# Patient Record
Sex: Female | Born: 1950 | Race: White | Hispanic: No | Marital: Married | Smoking: Never smoker
Health system: Southern US, Community
[De-identification: ages and names within clinical notes are randomized; demographics above are authoritative.]

## PROBLEM LIST (undated history)

## (undated) DIAGNOSIS — I700 Atherosclerosis of aorta: Secondary | ICD-10-CM

## (undated) DIAGNOSIS — F32A Depression, unspecified: Secondary | ICD-10-CM

## (undated) DIAGNOSIS — F419 Anxiety disorder, unspecified: Secondary | ICD-10-CM

## (undated) DIAGNOSIS — I77810 Thoracic aortic ectasia: Secondary | ICD-10-CM

## (undated) DIAGNOSIS — T4145XA Adverse effect of unspecified anesthetic, initial encounter: Secondary | ICD-10-CM

## (undated) DIAGNOSIS — Z87440 Personal history of urinary (tract) infections: Secondary | ICD-10-CM

## (undated) DIAGNOSIS — D649 Anemia, unspecified: Secondary | ICD-10-CM

## (undated) DIAGNOSIS — I10 Essential (primary) hypertension: Secondary | ICD-10-CM

## (undated) DIAGNOSIS — N289 Disorder of kidney and ureter, unspecified: Secondary | ICD-10-CM

## (undated) DIAGNOSIS — M8580 Other specified disorders of bone density and structure, unspecified site: Secondary | ICD-10-CM

## (undated) DIAGNOSIS — K5790 Diverticulosis of intestine, part unspecified, without perforation or abscess without bleeding: Secondary | ICD-10-CM

## (undated) DIAGNOSIS — K219 Gastro-esophageal reflux disease without esophagitis: Secondary | ICD-10-CM

## (undated) DIAGNOSIS — C58 Malignant neoplasm of placenta: Secondary | ICD-10-CM

## (undated) DIAGNOSIS — N814 Uterovaginal prolapse, unspecified: Secondary | ICD-10-CM

## (undated) DIAGNOSIS — J3802 Paralysis of vocal cords and larynx, bilateral: Secondary | ICD-10-CM

## (undated) DIAGNOSIS — R053 Chronic cough: Secondary | ICD-10-CM

## (undated) DIAGNOSIS — E785 Hyperlipidemia, unspecified: Secondary | ICD-10-CM

## (undated) DIAGNOSIS — M1990 Unspecified osteoarthritis, unspecified site: Secondary | ICD-10-CM

## (undated) DIAGNOSIS — S060XAA Concussion with loss of consciousness status unknown, initial encounter: Secondary | ICD-10-CM

## (undated) DIAGNOSIS — B9620 Unspecified Escherichia coli [E. coli] as the cause of diseases classified elsewhere: Secondary | ICD-10-CM

## (undated) DIAGNOSIS — O019 Hydatidiform mole, unspecified: Secondary | ICD-10-CM

## (undated) DIAGNOSIS — O0289 Other abnormal products of conception: Secondary | ICD-10-CM

## (undated) DIAGNOSIS — E538 Deficiency of other specified B group vitamins: Secondary | ICD-10-CM

## (undated) DIAGNOSIS — T8859XA Other complications of anesthesia, initial encounter: Secondary | ICD-10-CM

## (undated) DIAGNOSIS — K589 Irritable bowel syndrome without diarrhea: Secondary | ICD-10-CM

## (undated) DIAGNOSIS — R05 Cough: Secondary | ICD-10-CM

## (undated) DIAGNOSIS — T7840XA Allergy, unspecified, initial encounter: Secondary | ICD-10-CM

## (undated) DIAGNOSIS — N12 Tubulo-interstitial nephritis, not specified as acute or chronic: Secondary | ICD-10-CM

## (undated) DIAGNOSIS — S060X9A Concussion with loss of consciousness of unspecified duration, initial encounter: Secondary | ICD-10-CM

## (undated) DIAGNOSIS — E119 Type 2 diabetes mellitus without complications: Secondary | ICD-10-CM

## (undated) HISTORY — DX: Cough: R05

## (undated) HISTORY — DX: Hyperlipidemia, unspecified: E78.5

## (undated) HISTORY — DX: Anxiety disorder, unspecified: F41.9

## (undated) HISTORY — PX: CARDIAC CATHETERIZATION: SHX172

## (undated) HISTORY — DX: Atherosclerosis of aorta: I70.0

## (undated) HISTORY — DX: Malignant neoplasm of placenta: O02.89

## (undated) HISTORY — DX: Personal history of urinary (tract) infections: Z87.440

## (undated) HISTORY — DX: Depression, unspecified: F32.A

## (undated) HISTORY — DX: Essential (primary) hypertension: I10

## (undated) HISTORY — DX: Unspecified Escherichia coli (E. coli) as the cause of diseases classified elsewhere: B96.20

## (undated) HISTORY — DX: Paralysis of vocal cords and larynx, bilateral: J38.02

## (undated) HISTORY — DX: Uterovaginal prolapse, unspecified: N81.4

## (undated) HISTORY — PX: BUNIONECTOMY: SHX129

## (undated) HISTORY — PX: EYE SURGERY: SHX253

## (undated) HISTORY — DX: Other specified disorders of bone density and structure, unspecified site: M85.80

## (undated) HISTORY — DX: Tubulo-interstitial nephritis, not specified as acute or chronic: N12

## (undated) HISTORY — PX: DILATION AND CURETTAGE OF UTERUS: SHX78

## (undated) HISTORY — DX: Allergy, unspecified, initial encounter: T78.40XA

## (undated) HISTORY — DX: Irritable bowel syndrome, unspecified: K58.9

## (undated) HISTORY — DX: Hydatidiform mole, unspecified: O01.9

## (undated) HISTORY — DX: Anemia, unspecified: D64.9

## (undated) HISTORY — DX: Type 2 diabetes mellitus without complications: E11.9

## (undated) HISTORY — PX: CATARACT EXTRACTION: SUR2

## (undated) HISTORY — DX: Unspecified osteoarthritis, unspecified site: M19.90

## (undated) HISTORY — DX: Deficiency of other specified B group vitamins: E53.8

## (undated) HISTORY — DX: Diverticulosis of intestine, part unspecified, without perforation or abscess without bleeding: K57.90

## (undated) HISTORY — DX: Gastro-esophageal reflux disease without esophagitis: K21.9

## (undated) HISTORY — DX: Malignant neoplasm of placenta: C58

## (undated) HISTORY — DX: Chronic cough: R05.3

---
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
## 1998-01-05 ENCOUNTER — Encounter: Payer: Self-pay | Admitting: Internal Medicine

## 1999-10-22 DIAGNOSIS — J3802 Paralysis of vocal cords and larynx, bilateral: Secondary | ICD-10-CM

## 1999-10-22 HISTORY — DX: Paralysis of vocal cords and larynx, bilateral: J38.02

## 2000-02-06 ENCOUNTER — Emergency Department (HOSPITAL_COMMUNITY): Admission: EM | Admit: 2000-02-06 | Discharge: 2000-02-06 | Payer: Self-pay | Admitting: Emergency Medicine

## 2000-06-27 ENCOUNTER — Ambulatory Visit (HOSPITAL_COMMUNITY): Admission: RE | Admit: 2000-06-27 | Discharge: 2000-06-27 | Payer: Self-pay | Admitting: Family Medicine

## 2000-06-27 ENCOUNTER — Encounter: Payer: Self-pay | Admitting: Family Medicine

## 2001-01-21 ENCOUNTER — Encounter: Admission: RE | Admit: 2001-01-21 | Discharge: 2001-01-21 | Payer: Self-pay | Admitting: Family Medicine

## 2001-01-21 ENCOUNTER — Encounter: Payer: Self-pay | Admitting: Family Medicine

## 2002-02-26 ENCOUNTER — Encounter: Admission: RE | Admit: 2002-02-26 | Discharge: 2002-02-26 | Payer: Self-pay | Admitting: Family Medicine

## 2002-02-26 ENCOUNTER — Encounter: Payer: Self-pay | Admitting: Family Medicine

## 2003-03-04 ENCOUNTER — Encounter: Payer: Self-pay | Admitting: Family Medicine

## 2003-03-04 ENCOUNTER — Encounter: Admission: RE | Admit: 2003-03-04 | Discharge: 2003-03-04 | Payer: Self-pay | Admitting: Family Medicine

## 2003-09-06 ENCOUNTER — Ambulatory Visit (HOSPITAL_COMMUNITY): Admission: RE | Admit: 2003-09-06 | Discharge: 2003-09-06 | Payer: Self-pay | Admitting: Family Medicine

## 2004-05-22 ENCOUNTER — Other Ambulatory Visit: Admission: RE | Admit: 2004-05-22 | Discharge: 2004-05-22 | Payer: Self-pay | Admitting: Obstetrics and Gynecology

## 2005-02-14 ENCOUNTER — Ambulatory Visit: Payer: Self-pay | Admitting: Gastroenterology

## 2005-05-30 ENCOUNTER — Ambulatory Visit: Payer: Self-pay | Admitting: Gastroenterology

## 2005-06-05 ENCOUNTER — Ambulatory Visit (HOSPITAL_COMMUNITY): Admission: RE | Admit: 2005-06-05 | Discharge: 2005-06-05 | Payer: Self-pay | Admitting: Gastroenterology

## 2005-06-10 ENCOUNTER — Ambulatory Visit: Payer: Self-pay | Admitting: Gastroenterology

## 2005-06-21 ENCOUNTER — Ambulatory Visit (HOSPITAL_COMMUNITY): Admission: RE | Admit: 2005-06-21 | Discharge: 2005-06-21 | Payer: Self-pay | Admitting: Gastroenterology

## 2005-06-21 ENCOUNTER — Ambulatory Visit: Payer: Self-pay | Admitting: Gastroenterology

## 2005-07-12 ENCOUNTER — Encounter: Admission: RE | Admit: 2005-07-12 | Discharge: 2005-07-12 | Payer: Self-pay | Admitting: General Surgery

## 2005-07-30 ENCOUNTER — Other Ambulatory Visit: Admission: RE | Admit: 2005-07-30 | Discharge: 2005-07-30 | Payer: Self-pay | Admitting: Obstetrics and Gynecology

## 2005-08-16 ENCOUNTER — Encounter: Admission: RE | Admit: 2005-08-16 | Discharge: 2005-08-16 | Payer: Self-pay | Admitting: General Surgery

## 2005-09-02 ENCOUNTER — Encounter: Admission: RE | Admit: 2005-09-02 | Discharge: 2005-09-02 | Payer: Self-pay | Admitting: General Surgery

## 2008-05-06 ENCOUNTER — Encounter: Payer: Self-pay | Admitting: Internal Medicine

## 2009-07-21 ENCOUNTER — Ambulatory Visit (HOSPITAL_BASED_OUTPATIENT_CLINIC_OR_DEPARTMENT_OTHER): Admission: RE | Admit: 2009-07-21 | Discharge: 2009-07-21 | Payer: Self-pay | Admitting: Orthopedic Surgery

## 2010-02-28 ENCOUNTER — Encounter: Payer: Self-pay | Admitting: Internal Medicine

## 2010-03-12 ENCOUNTER — Encounter: Payer: Self-pay | Admitting: Internal Medicine

## 2010-05-07 ENCOUNTER — Encounter: Payer: Self-pay | Admitting: Internal Medicine

## 2010-10-21 DIAGNOSIS — Z8619 Personal history of other infectious and parasitic diseases: Secondary | ICD-10-CM

## 2010-10-21 HISTORY — DX: Personal history of other infectious and parasitic diseases: Z86.19

## 2010-11-17 ENCOUNTER — Encounter: Payer: Self-pay | Admitting: Internal Medicine

## 2010-11-19 ENCOUNTER — Other Ambulatory Visit: Payer: Self-pay | Admitting: Internal Medicine

## 2010-11-19 ENCOUNTER — Ambulatory Visit
Admission: RE | Admit: 2010-11-19 | Discharge: 2010-11-19 | Payer: Self-pay | Source: Home / Self Care | Attending: Internal Medicine | Admitting: Internal Medicine

## 2010-11-19 ENCOUNTER — Encounter: Payer: Self-pay | Admitting: Internal Medicine

## 2010-11-19 DIAGNOSIS — N814 Uterovaginal prolapse, unspecified: Secondary | ICD-10-CM | POA: Insufficient documentation

## 2010-11-19 DIAGNOSIS — K589 Irritable bowel syndrome without diarrhea: Secondary | ICD-10-CM | POA: Insufficient documentation

## 2010-11-19 DIAGNOSIS — E785 Hyperlipidemia, unspecified: Secondary | ICD-10-CM | POA: Insufficient documentation

## 2010-11-19 DIAGNOSIS — I10 Essential (primary) hypertension: Secondary | ICD-10-CM | POA: Insufficient documentation

## 2010-11-19 DIAGNOSIS — K219 Gastro-esophageal reflux disease without esophagitis: Secondary | ICD-10-CM | POA: Insufficient documentation

## 2010-11-19 DIAGNOSIS — M8580 Other specified disorders of bone density and structure, unspecified site: Secondary | ICD-10-CM | POA: Insufficient documentation

## 2010-11-19 DIAGNOSIS — F411 Generalized anxiety disorder: Secondary | ICD-10-CM | POA: Insufficient documentation

## 2010-11-19 DIAGNOSIS — E538 Deficiency of other specified B group vitamins: Secondary | ICD-10-CM | POA: Insufficient documentation

## 2010-11-19 DIAGNOSIS — E1169 Type 2 diabetes mellitus with other specified complication: Secondary | ICD-10-CM | POA: Insufficient documentation

## 2010-11-19 DIAGNOSIS — Z8759 Personal history of other complications of pregnancy, childbirth and the puerperium: Secondary | ICD-10-CM | POA: Insufficient documentation

## 2010-11-19 LAB — B12 AND FOLATE PANEL
Folate: 9.3 ng/mL (ref >5.9)
Vitamin B-12: 573 pg/mL (ref 211–911)

## 2010-11-19 LAB — CBC WITH DIFFERENTIAL/PLATELET
Basophils Absolute: 0 10*3/uL (ref 0.0–0.1)
Basophils Relative: 0.5 % (ref 0.0–3.0)
Eosinophils Absolute: 0.1 10*3/uL (ref 0.0–0.7)
HCT: 39.7 % (ref 36.0–46.0)
Hemoglobin: 13.4 g/dL (ref 12.0–15.0)
Lymphocytes Relative: 36.9 % (ref 12.0–46.0)
Lymphs Abs: 2.8 10*3/uL (ref 0.7–4.0)
MCHC: 33.7 g/dL (ref 30.0–36.0)
MCV: 88.9 fl (ref 78.0–100.0)
Monocytes Absolute: 0.5 10*3/uL (ref 0.1–1.0)
Monocytes Relative: 6.9 % (ref 3.0–12.0)
Neutro Abs: 4.2 10*3/uL (ref 1.4–7.7)
RBC: 4.47 Mil/uL (ref 3.87–5.11)
RDW: 14.2 % (ref 11.5–14.6)
WBC: 7.7 10*3/uL (ref 4.5–10.5)

## 2010-11-19 LAB — BASIC METABOLIC PANEL
BUN: 15 mg/dL (ref 6–23)
CO2: 30 mEq/L (ref 19–32)
Calcium: 9.6 mg/dL (ref 8.4–10.5)
Chloride: 96 mEq/L (ref 96–112)
Creatinine, Ser: 0.9 mg/dL (ref 0.4–1.2)
Glucose, Bld: 101 mg/dL — ABNORMAL HIGH (ref 70–99)
Potassium: 3.7 mEq/L (ref 3.5–5.1)
Sodium: 134 mEq/L — ABNORMAL LOW (ref 135–145)

## 2010-11-19 LAB — HEPATIC FUNCTION PANEL
Albumin: 4.5 g/dL (ref 3.5–5.2)
Alkaline Phosphatase: 43 U/L (ref 39–117)
Bilirubin, Direct: 0.1 mg/dL (ref 0.0–0.3)
Total Bilirubin: 0.5 mg/dL (ref 0.3–1.2)
Total Protein: 7.4 g/dL (ref 6.0–8.3)

## 2010-11-19 LAB — LIPID PANEL
Cholesterol: 144 mg/dL (ref 0–200)
HDL: 39.1 mg/dL (ref >39.00)
LDL Cholesterol: 84 mg/dL (ref 0–99)
Total CHOL/HDL Ratio: 4

## 2010-11-19 LAB — TSH: TSH: 2.3 u[IU]/mL (ref 0.35–5.50)

## 2010-11-22 NOTE — Miscellaneous (Signed)
Summary: MED LIST PRELOAD  Clinical Lists Changes  Medications: Added new medication of TRIAMTERENE-HCTZ 37.5-25 MG TABS (TRIAMTERENE-HCTZ) 1 once daily Added new medication of LEXAPRO 10 MG TABS (ESCITALOPRAM OXALATE) 1 once daily Added new medication of ACIPHEX 20 MG TBEC (RABEPRAZOLE SODIUM) 1 once daily Added new medication of SIMVASTATIN 40 MG TABS (SIMVASTATIN) 1 at bedtime Added new medication of ALIGN 4 MG CAPS (PROBIOTIC PRODUCT) 1 once daily Added new medication of METOCLOPRAMIDE HCL 5 MG TABS (METOCLOPRAMIDE HCL) 1 two times a day as needed Added new medication of BONIVA 150 MG TABS (IBANDRONATE SODIUM) 1 once monthly Added new medication of CYANOCOBALAMIN 1000 MCG/ML SOLN (CYANOCOBALAMIN) once monthly IM injection Added new medication of POLYETHYLENE GLYCOL 3350  POWD (POLYETHYLENE GLYCOL 3350) 1 capful (17gm) dissolved in water or other liquid 3 times a week Added new medication of CLIDINIUM-CHLORDIAZEPOXIDE 2.5-5 MG CAPS (CLIDINIUM-CHLORDIAZEPOXIDE) 1 two times a day as needed Added new medication of ALPRAZOLAM 0.5 MG TABS (ALPRAZOLAM) 1 once daily as needed

## 2010-11-22 NOTE — Miscellaneous (Signed)
Summary: MED LIST PRELOAD  Clinical Lists Changes  Medications: Added new medication of * BIEST 7/3 ESTRADIOL/PROGESTERONE HRT 0.05-30MG /ML Apply 2 mL to inner arm or thigh and rub in twice daily

## 2010-11-28 ENCOUNTER — Telehealth (INDEPENDENT_AMBULATORY_CARE_PROVIDER_SITE_OTHER): Payer: Self-pay | Admitting: *Deleted

## 2010-11-28 NOTE — Assessment & Plan Note (Signed)
Summary: new to est/state bcbs/#/cd   Vital Signs:  Patient profile:   60 year old female Height:      64.5 inches Weight:      149 pounds BMI:     25.27 O2 Sat:      96 % on Room air Temp:     98.4 degrees F oral Pulse rate:   79 / minute BP sitting:   118 / 80  (left arm) Cuff size:   regular  Vitals Entered By: Bill Salinas CMA (November 19, 2010 8:52 AM)  O2 Flow:  Room air  Primary Care Provider:  Jacques Navy MD   History of Present Illness: Patient presents to establish for on-ging continuity care.  She has had a h/o of reflux and dyspepsia aciphex and reglan. She has had liquid dysphagia but this now better.GI- Zollie Pee, PA at [facility]. She has had studies at [facility] including Barium study. she has a long h/o using PPIs therapy but this does no always control her symptoms.   the Right great toe deviates medially after bunionectomy and there is increased tension in the extensor tendon for that toe. This has lead to pain and difficulty with shoes that are comfortable.   Preventive Screening-Counseling & Management  Alcohol-Tobacco     Alcohol drinks/day: 0     Smoking Status: never  Caffeine-Diet-Exercise     Caffeine use/day: 3-4 cups per day     Does Patient Exercise: no  Hep-HIV-STD-Contraception     Hepatitis Risk: no risk noted     HIV Risk: no risk noted     STD Risk: no risk noted     Dental Visit-last 6 months yes     Sun Exposure-Excessive: no  Safety-Violence-Falls     Seat Belt Use: yes     Helmet Use: yes     Firearms in the Home: firearms in the home     Smoke Detectors: yes     Violence in the Home: no risk noted     Sexual Abuse: no     Fall Risk: low fall risk      Sexual History:  currently monogamous.        Drug Use:  never.        Blood Transfusions:  no.    Current Medications (verified): 1)  Triamterene-Hctz 37.5-25 Mg Tabs (Triamterene-Hctz) .Marland Kitchen.. 1 Once Daily 2)  Lexapro 10 Mg Tabs (Escitalopram Oxalate) .Marland Kitchen.. 1 Once  Daily 3)  Aciphex 20 Mg Tbec (Rabeprazole Sodium) .Marland Kitchen.. 1 Once Daily 4)  Simvastatin 40 Mg Tabs (Simvastatin) .Marland Kitchen.. 1 At Bedtime 5)  Align 4 Mg Caps (Probiotic Product) .Marland Kitchen.. 1 Once Daily 6)  Metoclopramide Hcl 5 Mg Tabs (Metoclopramide Hcl) .Marland Kitchen.. 1 Two Times A Day As Needed 7)  Boniva 150 Mg Tabs (Ibandronate Sodium) .Marland Kitchen.. 1 Once Monthly 8)  Cyanocobalamin 1000 Mcg/ml Soln (Cyanocobalamin) .Marland Kitchen.. Once Monthly Im Injection 9)  Polyethylene Glycol 3350  Powd (Polyethylene Glycol 3350) .Marland Kitchen.. 1 Capful (17gm) Dissolved in Water or Other Liquid 3 Times A Week 10)  Clidinium-Chlordiazepoxide 2.5-5 Mg Caps (Clidinium-Chlordiazepoxide) .Marland Kitchen.. 1 Two Times A Day As Needed 11)  Alprazolam 0.5 Mg Tabs (Alprazolam) .Marland Kitchen.. 1 Once Daily As Needed 12)  Biest 7/3 Estradiol/progesterone Hrt 0.05-30mg /ml .... Apply 2 Ml To Inner Arm or Thigh and Rub in Twice Daily  Allergies (verified): 1)  ! Erythromycin  Past History:  Past Medical History: ANXIETY, CHRONIC (ICD-300.00) B12 DEFICIENCY (ICD-266.2) IBS (ICD-564.1) OSTEOPENIA (ICD-733.90) ESSENTIAL HYPERTENSION (ICD-401.9)  UTERINE PROLAPSE (ICD-618.1) HYPERLIPIDEMIA (ICD-272.4)  Past Surgical History: Cataract extraction: right 4/11; left May '11 Bunionectomy 1999/03/23 ( [facility])  P3,G2  Molar pregnancy (trophoblastic disease)  Family History: Father - deceased @73 : stomach/esophagus cancer drinker/smokerr.  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 Smoking Status:  never Caffeine use/day:  3-4 cups per day Does Patient Exercise:  no Dental Care w/in 6 mos.:  yes March 23, 2023 Exposure-Excessive:  no Seat Belt Use:   yes Fall Risk:  low fall risk Blood Transfusions:  no Hepatitis Risk:  no risk noted HIV Risk:  no risk noted STD Risk:  no risk noted Sexual History:  currently monogamous Drug Use:  never  Review of Systems       The patient complains of hoarseness and prolonged cough.  The patient denies anorexia, fever, weight loss, weight gain, vision loss, decreased hearing, chest pain, syncope, dyspnea on exertion, peripheral edema, hemoptysis, abdominal pain, melena, hematochezia, severe indigestion/heartburn, incontinence, muscle weakness, suspicious skin lesions, difficulty walking, unusual weight change, abnormal bleeding, and angioedema.    Physical Exam  General:  alert, well-developed, well-nourished, well-hydrated, normal appearance, healthy-appearing, and cooperative to examination.   Head:  normocephalic and atraumatic.   Eyes:  vision grossly intact, pupils equal, pupils round, pupils reactive to light, corneas and lenses clear, and no injection.   Ears:  External ear exam shows no significant lesions or deformities.  Otoscopic examination reveals clear canals, tympanic membranes are intact bilaterally without bulging, retraction, inflammation or discharge. Hearing is grossly normal bilaterally. Nose:  no external deformity, no external erythema, and no nasal discharge.   Mouth:  Oral mucosa and oropharynx without lesions or exudates.  Teeth in good repair. Neck:  supple, full ROM, no thyromegaly, and no carotid bruits.   Chest Wall:  no deformities.   Breasts:  deferred to gyn Lungs:  Normal respiratory effort, chest expands symmetrically. Lungs are clear to auscultation, no crackles or wheezes. Heart:  Normal rate and regular rhythm. S1 and S2 normal without gallop, murmur, click, rub or other extra sounds. Abdomen:  soft, normal bowel sounds, and no guarding.   Genitalia:  deferred Msk:  normal ROM, no joint tenderness, no joint swelling, no joint warmth, no joint deformities, and no  joint instability.   Pulses:  2+ radial Extremities:  No clubbing, cyanosis, edema, or deformity noted with normal full range of motion of all joints.   Neurologic:  alert & oriented X3, cranial nerves II-XII intact, strength normal in all extremities, sensation intact to pinprick, gait normal, and DTRs symmetrical and normal.   Skin:  turgor normal, color normal, no rashes, and no petechiae.   Cervical Nodes:  no anterior cervical adenopathy and no posterior cervical adenopathy.   Psych:  Oriented X3, memory intact for recent and remote, normally interactive, and good eye contact.     Impression & Recommendations:  Problem # 1:  ANXIETY, CHRONIC (ICD-300.00) Long standing problem generally well controlled with lexapro. she has great reservations about using  alprazolam but will on occasion.  Plan - renew Rx  Her updated medication list for this problem includes:    Lexapro 10 Mg  Tabs (Escitalopram oxalate) .Marland Kitchen... 1 once daily    Alprazolam 0.5 Mg Tabs (Alprazolam) .Marland Kitchen... 1 once daily as needed  Problem # 2:  B12 DEFICIENCY (ICD-266.2) She has been on replacement therapy.  Plan - B12 level with recommendations to follow.  Orders: TLB-B12 + Folate Pnl (82746_82607-B12/FOL)  addendum  B12 573  Plan - continue B12 injections.  Problem # 3:  IBS (ICD-564.1) Reports that her symptoms at this time are well controlled.  Problem # 4:  ESSENTIAL HYPERTENSION (ICD-401.9)  Her updated medication list for this problem includes:    Triamterene-hctz 37.5-25 Mg Tabs (Triamterene-hctz) .Marland Kitchen... 1 once daily  Orders: TLB-BMP (Basic Metabolic Panel-BMET) (80048-METABOL)  BP today: 118/80  Very good control on simple diuretic therapy.  Plan - routine lab.  Problem # 5:  HYPERLIPIDEMIA (ICD-272.4) She reports that as of last lab in May revealed OK  control.  Plan routine follow-up lab with recommendations to follow.  Her updated medication list for this problem includes:    Simvastatin  40 Mg Tabs (Simvastatin) .Marland Kitchen... 1 at bedtime  Orders: TLB-Lipid Panel (80061-LIPID) TLB-Hepatic/Liver Function Pnl (80076-HEPATIC) TLB-TSH (Thyroid Stimulating Hormone) (84443-TSH)  Addendum - excellent control with LDL @ 84  Problem # 6:  OSTEOPENIA (ICD-733.90) She does not recall her T scores but reports a diagnosis of osteopenia not osteoporosis. Discussed IOM recommendations on treatment of osteoporosis and the life-style management of osteopenia. With her problems with reflux and motility I have advised against the use of oral bisphosphonates. Furthermore, she is taking hormone replacement therapy that should provide adequate treatment for bone health.  Plan - stop boniva           continue transdermal HRT           Take in a total of 1200mg  calcium daily, [phone] international units vit D and regular weight bearing exercise.           obtain last to DXA scans for our records.   Her updated medication list for this problem includes:    Boniva 150 Mg Tabs (Ibandronate sodium) .Marland Kitchen... 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   Plan - obtain previous BaSwallow           take aciphex q AM           ok to continue reglan at bedtime for now.           may need additional agent, i.e. carafate.  Her updated medication list for this problem includes:    Aciphex 20 Mg Tbec (Rabeprazole sodium) .Marland Kitchen... 1 once daily    Clidinium-chlordiazepoxide 2.5-5 Mg Caps (Clidinium-chlordiazepoxide) .Marland Kitchen... 1 two times a day as needed  Problem # 8:  Preventive Health Care (ICD-V70.0) History as noted with active problems with GI track. She is current with her gynecologist for exams. She is current with breast cancer screening - last mammogram May '11. Current with colorectal cancer screening with last  study May '06. Immunizations: Tetnus Aug '11; Flu Oct '10. 12 Lead EKG is normal without signs of ischemia.  In summary - a very nice woman who is now established for on-going care. She is oriented to the clinice and our services. She will return in 2-3 months for routine follow-up.  Complete Medication List: 1)  Triamterene-hctz 37.5-25 Mg Tabs (Triamterene-hctz) .Marland KitchenMarland KitchenMarland Kitchen  1 once daily 2)  Lexapro 10 Mg Tabs (Escitalopram oxalate) .Marland Kitchen.. 1 once daily 3)  Aciphex 20 Mg Tbec (Rabeprazole sodium) .Marland Kitchen.. 1 once daily 4)  Simvastatin 40 Mg Tabs (Simvastatin) .Marland Kitchen.. 1 at bedtime 5)  Align 4 Mg Caps (Probiotic product) .Marland Kitchen.. 1 once daily 6)  Metoclopramide Hcl 5 Mg Tabs (Metoclopramide hcl) .Marland Kitchen.. 1 two times a day as needed 7)  Boniva 150 Mg Tabs (Ibandronate sodium) .Marland Kitchen.. 1 once monthly 8)  Cyanocobalamin 1000 Mcg/ml Soln (Cyanocobalamin) .Marland Kitchen.. once monthly im injection 9)  Polyethylene Glycol 3350 Powd (Polyethylene glycol 3350) .Marland Kitchen.. 1 capful (17gm) dissolved in water or other liquid 3 times a week 10)  Clidinium-chlordiazepoxide 2.5-5 Mg Caps (Clidinium-chlordiazepoxide) .Marland Kitchen.. 1 two times a day as needed 11)  Alprazolam 0.5 Mg Tabs (Alprazolam) .Marland Kitchen.. 1 once daily as needed 12)  Biest 7/3 Estradiol/progesterone Hrt 0.05-30mg /ml  .... Apply 2 ml to inner arm or thigh and rub in twice daily  Patient: [facility] Note: All result statuses are Final unless otherwise noted.  Tests: (1) Lipid Panel (LIPID)   Cholesterol               144 mg/dL                   1-610     ATP III Classification            Desirable:  < 200 mg/dL                    Borderline High:  200 - 239 mg/dL               High:  > = 240 mg/dL   Triglycerides             107.0 mg/dL                 9.6-045.4     Normal:  <150 mg/dL     Borderline High:  098 - 199 mg/dL   HDL                       11.91 mg/dL                 >47.82   VLDL Cholesterol          21.4 mg/dL                  9.5-62.1   LDL Cholesterol           84 mg/dL                     3-08  CHO/HDL Ratio:  CHD Risk                             4                    Men          Women     1/2 Average Risk     3.4          3.3     Average Risk          5.0          4.4     2X Average Risk          9.6          7.1  3X Average Risk          15.0          11.0                           Tests: (2) Hepatic/Liver Function Panel (HEPATIC)   Total Bilirubin           0.5 mg/dL                   1.6-1.0   Direct Bilirubin          0.1 mg/dL                   9.6-0.4   Alkaline Phosphatase      43 U/L                      39-117   AST                       29 U/L                      0-37   ALT                       18 U/L                      0-35   Total Protein             7.4 g/dL                    5.4-0.9   Albumin                   4.5 g/dL                    8.1-1.9  Tests: (3) BMP (METABOL)   Sodium               [L]  134 mEq/L                   135-145   Potassium                 3.7 mEq/L                   3.5-5.1   Chloride                  96 mEq/L                    96-112   Carbon Dioxide            30 mEq/L                    19-32   Glucose              [H]  101 mg/dL                   14-78   BUN                       15 mg/dL                    2-95   Creatinine  0.9 mg/dL                   1.6-1.0   Calcium                   9.6 mg/dL                   9.6-04.5   GFR                       67.05 mL/min                >60.00  Tests: (4) B12 + Folate Panel (B12/FOL)   Vitamin B12               573 pg/mL                   211-911   Folate                    9.3 ng/mL                   >5.9  Tests: (5) CBC Platelet w/Diff (CBCD)   White Cell Count          7.7 K/uL                    4.5-10.5   Red Cell Count            4.47 Mil/uL                 3.87-5.11   Hemoglobin                13.4 g/dL                   40.9-81.1   Hematocrit                39.7 %                      36.0-46.0   MCV                       88.9 fl                      78.0-100.0   MCHC                      33.7 g/dL                   91.4-78.2   RDW                       14.2 %                      11.5-14.6   Platelet Count            351.0 K/uL                  150.0-400.0   Neutrophil %              53.8 %                      43.0-77.0   Lymphocyte %              36.9 %  12.0-46.0   Monocyte %                6.9 %                       3.0-12.0   Eosinophils%              1.9 %                       0.0-5.0   Basophils %               0.5 %                       0.0-3.0   Neutrophill Absolute      4.2 K/uL                    1.4-7.7   Lymphocyte Absolute       2.8 K/uL                    0.7-4.0   Monocyte Absolute         0.5 K/uL                    0.1-1.0  Eosinophils, Absolute                             0.1 K/uL                    0.0-0.7   Basophils Absolute        0.0 K/uL                    0.0-0.1  Tests: (6) TSH (TSH)   FastTSH                   2.30 uIU/mL                 0.35-5.50Prescriptions: CLIDINIUM-CHLORDIAZEPOXIDE 2.5-5 MG CAPS (CLIDINIUM-CHLORDIAZEPOXIDE) 1 two times a day as needed  #180 x 3   Entered by:   Ami Bullins CMA   Authorized by:   Jacques Navy MD   Signed by:   Bill Salinas CMA on 11/19/2010   Method used:   Electronically to        [facility] [phone]* (retail)       [address]       [place], [place]  [address]       Ph: [phone]       Fax: [phone]   RxID:   [id number] METOCLOPRAMIDE HCL 5 MG TABS (METOCLOPRAMIDE HCL) 1 two times a day as needed  #180 x 3   Entered by:   Ami Bullins CMA   Authorized by:   Jacques Navy MD   Signed by:   Bill Salinas CMA on 11/19/2010   Method used:   Electronically to        [facility] [phone]* (retail)       [address]       [place], [place]  [address]       Ph: [phone]       Fax: [phone]   RxID:   [id number] SIMVASTATIN 40 MG TABS (SIMVASTATIN) 1 at bedtime  #90 x 3   Entered  by:   Ami Bullins CMA  Authorized by:   Jacques Navy MD   Signed by:   Bill Salinas CMA on 11/19/2010   Method used:   Electronically to        [facility] [phone]* (retail)       [address]       [place], [place]  [address]       Ph: [phone]       Fax: [phone]   RxID:   [id number] ACIPHEX 20 MG TBEC (RABEPRAZOLE SODIUM) 1 once daily  #90 x 3   Entered by:   Bill Salinas CMA   Authorized by:   Jacques Navy MD   Signed by:   Bill Salinas CMA on 11/19/2010   Method used:   Electronically to        [facility] [phone]* (retail)       [address]       [place], [place]  [address]       Ph: [phone]       Fax: [phone]   RxID:   [id number] LEXAPRO 10 MG TABS (ESCITALOPRAM OXALATE) 1 once daily  #90 x 3   Entered by:   Ami Bullins CMA   Authorized by:   Jacques Navy MD   Signed by:   Bill Salinas CMA on 11/19/2010   Method used:   Electronically to        [facility] [phone]* (retail)       [address]       [place], [place]  [address]       Ph: [phone]       Fax: [phone]   RxID:   [id number] TRIAMTERENE-HCTZ 37.5-25 MG TABS (TRIAMTERENE-HCTZ) 1 once daily  #90 x 3   Entered by:   Bill Salinas CMA   Authorized by:   Jacques Navy MD   Signed by:   Bill Salinas CMA on 11/19/2010   Method used:   Electronically to        [facility] [phone]* (retail)       [address]       [place], [place]  [address]       Ph: [phone]       Fax: [phone]   RxID:   [id number]    Orders Added: 1)  TLB-Lipid Panel [80061-LIPID] 2)  TLB-Hepatic/Liver Function Pnl [80076-HEPATIC] 3)  TLB-BMP (Basic Metabolic Panel-BMET) [80048-METABOL] 4)  TLB-B12 + Folate Pnl [82746_82607-B12/FOL] 5)  TLB-CBC Platelet - w/Differential [85025-CBCD] 6)  TLB-TSH (Thyroid Stimulating Hormone) [84443-TSH] 7)  New Patient Level III [76195]   Immunization History:  Influenza Immunization History:    Influenza:   historical (07/25/2009)   Immunization History:  Influenza Immunization History:    Influenza:  Historical (07/25/2009)    Preventive Care Screening  Last Tetanus Booster:    Date:  06/09/2010    Results:  Historical   Pap Smear:    Date:  04/04/2010    Results:  normal   Mammogram:    Date:  02/18/2009    Results:  normal   Bone Density:    Date:  11/02/2008    Results:  abnormal std dev  Colonoscopy:    Date:  03/06/2005    Results:  Hyperplastic Polyp     Preventive Care Screening  Last Tetanus Booster:    Date:  06/09/2010    Results:  Historical   Pap Smear:    Date:  04/04/2010  Results:  normal   Mammogram:    Date:  02/18/2009    Results:  normal   Bone Density:    Date:  11/02/2008    Results:  abnormal std dev  Colonoscopy:    Date:  03/06/2005    Results:  Hyperplastic Polyp

## 2010-12-04 ENCOUNTER — Telehealth (INDEPENDENT_AMBULATORY_CARE_PROVIDER_SITE_OTHER): Payer: Self-pay | Admitting: *Deleted

## 2010-12-05 ENCOUNTER — Telehealth (INDEPENDENT_AMBULATORY_CARE_PROVIDER_SITE_OTHER): Payer: Self-pay | Admitting: *Deleted

## 2010-12-05 ENCOUNTER — Encounter: Payer: Self-pay | Admitting: Internal Medicine

## 2010-12-06 ENCOUNTER — Encounter: Payer: Self-pay | Admitting: Internal Medicine

## 2010-12-06 NOTE — Progress Notes (Signed)
  Phone Note Other Incoming   Request: Send information Summary of Call: Records received from [place] [facility]. 99 pages forwarded to Dr. Debby Bud for review.

## 2010-12-12 ENCOUNTER — Telehealth (INDEPENDENT_AMBULATORY_CARE_PROVIDER_SITE_OTHER): Payer: Self-pay | Admitting: *Deleted

## 2010-12-12 NOTE — Progress Notes (Signed)
Summary: PA-Aciphex  Phone Note From Pharmacy   Summary of Call: PA-Aciphex faxed to [facility] @ [phone], awaiting approval. Shelby Vazquez  December 04, 2010 2:33 PM Approved 11/2010 - 11/2011, pt aware. Initial call taken by: Shelby Vazquez,  December 06, 2010 2:39 PM

## 2010-12-12 NOTE — Progress Notes (Signed)
  Phone Note Other Incoming   Request: Send information Summary of Call: Records received from [facility]. 45 pages forwarded to Dr. Debby Bud for review.

## 2010-12-12 NOTE — Progress Notes (Signed)
  Phone Note Other Incoming   Request: Send information Summary of Call: Records received from [facility]. 54 pages forwarded to Dr. Debby Bud for review.

## 2010-12-12 NOTE — Letter (Signed)
Summary: Date range:05-22-04 to 03-12-10/[facility]  Date range:05-22-04 to 03-12-10/[facility]   Imported By: Sherian Rein 12/07/2010 09:58:21  _____________________________________________________________________  External Attachment:    Type:   Image     Comment:   External Document

## 2010-12-12 NOTE — Letter (Signed)
Summary: 2/08-5/11 [facility] Vaz[facility]  2/08-5/11 [facility]   Imported By: Shelby Vazquez 12/05/2010 09:02:33  _____________________________________________________________________  External Attachment:    Type:   Image     Comment:   External Document

## 2010-12-18 NOTE — Progress Notes (Signed)
  Phone Note Other Incoming   Request: Send information Summary of Call: Received records from [facility] 11 pages sent to Dr.Norins.

## 2010-12-18 NOTE — Medication Information (Signed)
Summary: Approved/[facility]  Approved/[facility]   Imported By: Lester  12/10/2010 10:14:13  _____________________________________________________________________  External Attachment:    Type:   Image     Comment:   External Document

## 2010-12-27 NOTE — Medication Information (Signed)
Summary: Aciphex approved/[facility]  Aciphex approved/[facility]   Imported By: Sherian Rein 12/12/2010 09:29:52  _____________________________________________________________________  External Attachment:    Type:   Image     Comment:   External Document

## 2010-12-27 NOTE — Letter (Signed)
Summary: [facility]  [facility]   Imported By: Lester [place] 12/21/2010 11:02:55  _____________________________________________________________________  External Attachment:    Type:   Image     Comment:   External Document

## 2011-01-18 ENCOUNTER — Telehealth: Payer: Self-pay | Admitting: Internal Medicine

## 2011-01-18 NOTE — Telephone Encounter (Signed)
Forwarded to Dr. Norins for review. °

## 2011-01-21 ENCOUNTER — Encounter: Payer: Self-pay | Admitting: Internal Medicine

## 2011-01-24 LAB — POCT HEMOGLOBIN-HEMACUE: Hemoglobin: 14.3 g/dL (ref 12.0–15.0)

## 2011-01-25 LAB — BASIC METABOLIC PANEL
BUN: 13 mg/dL (ref 6–23)
CO2: 29 mEq/L (ref 19–32)
Calcium: 9.7 mg/dL (ref 8.4–10.5)
Chloride: 102 mEq/L (ref 96–112)
Creatinine, Ser: 0.97 mg/dL (ref 0.4–1.2)
GFR calc Af Amer: >60 mL/min (ref >60)
GFR calc non Af Amer: 59 mL/min — ABNORMAL LOW (ref >60)
Glucose, Bld: 145 mg/dL — ABNORMAL HIGH (ref 70–99)
Potassium: 4 mEq/L (ref 3.5–5.1)
Sodium: 139 mEq/L (ref 135–145)

## 2011-01-31 ENCOUNTER — Telehealth: Payer: Self-pay | Admitting: *Deleted

## 2011-01-31 NOTE — Telephone Encounter (Signed)
March 12th bone density study with T score of -0.2 lumbar spine which is normal range. Femoral neck scores -1.5 , osteopenic scores. Could not readily locate prior studies. These current values would not require treatment with medication. The drugs used have a very long duration of action, we think in terms of years. At this point it is very safe and reasonable to continue with Calcium, Vit D 1000 iu daily and mild weight bearing exercise. A repeat study can be done a little earlier than the normal 24 months, i.e. 18 months to look for any trend towards progressive bone loss.

## 2011-01-31 NOTE — Telephone Encounter (Signed)
Pt is req results of recent bone density.The last 3 dexa's are separated and on MD's desk for review. She just stopped boniva per his suggestion but is worried that only calcium + D is not enough. Her mother has had hip and spinal fractures, is severely kyphotic and osteoporotic.

## 2011-02-01 NOTE — Telephone Encounter (Signed)
Pt aware.

## 2011-02-04 ENCOUNTER — Encounter: Payer: Self-pay | Admitting: Internal Medicine

## 2011-02-06 ENCOUNTER — Encounter: Payer: Self-pay | Admitting: Internal Medicine

## 2011-03-19 ENCOUNTER — Other Ambulatory Visit: Payer: Self-pay | Admitting: *Deleted

## 2011-03-19 MED ORDER — POLYETHYLENE GLYCOL 3350 17 GM/SCOOP PO POWD: 17.0000 g | Freq: Two times a day (BID) | ORAL | Status: AC | PRN

## 2011-03-22 ENCOUNTER — Telehealth: Payer: Self-pay | Admitting: *Deleted

## 2011-03-22 MED ORDER — ALPRAZOLAM 0.5 MG PO TABS: 0.5000 mg | ORAL_TABLET | Freq: Every day | ORAL | Status: DC

## 2011-03-22 NOTE — Telephone Encounter (Signed)
Please Advise refill on Alprazolam 0.5 SIG one tablet once a day prn

## 2011-03-22 NOTE — Telephone Encounter (Signed)
Ok to refill x 5 

## 2011-04-16 ENCOUNTER — Other Ambulatory Visit: Payer: Self-pay

## 2011-04-16 MED ORDER — CYANOCOBALAMIN 1000 MCG/ML IJ SOLN: 1000.0000 ug | Freq: Once | INTRAMUSCULAR | Status: DC

## 2011-05-03 ENCOUNTER — Telehealth: Payer: Self-pay | Admitting: Internal Medicine

## 2011-05-03 NOTE — Telephone Encounter (Signed)
Forwarded to Dr. Norins for review. °

## 2011-06-10 ENCOUNTER — Telehealth: Payer: Self-pay | Admitting: *Deleted

## 2011-06-10 ENCOUNTER — Other Ambulatory Visit (INDEPENDENT_AMBULATORY_CARE_PROVIDER_SITE_OTHER): Payer: BC Managed Care – PPO

## 2011-06-10 ENCOUNTER — Encounter: Payer: Self-pay | Admitting: Internal Medicine

## 2011-06-10 ENCOUNTER — Other Ambulatory Visit: Payer: Self-pay | Admitting: Internal Medicine

## 2011-06-10 ENCOUNTER — Ambulatory Visit (INDEPENDENT_AMBULATORY_CARE_PROVIDER_SITE_OTHER): Payer: BC Managed Care – PPO | Admitting: Internal Medicine

## 2011-06-10 DIAGNOSIS — L299 Pruritus, unspecified: Secondary | ICD-10-CM

## 2011-06-10 DIAGNOSIS — R509 Fever, unspecified: Secondary | ICD-10-CM

## 2011-06-10 DIAGNOSIS — R519 Headache, unspecified: Secondary | ICD-10-CM

## 2011-06-10 DIAGNOSIS — R51 Headache: Secondary | ICD-10-CM

## 2011-06-10 DIAGNOSIS — I10 Essential (primary) hypertension: Secondary | ICD-10-CM

## 2011-06-10 LAB — CBC WITH DIFFERENTIAL/PLATELET
Basophils Relative: 0.7 % (ref 0.0–3.0)
Eosinophils Absolute: 0.1 10*3/uL (ref 0.0–0.7)
HCT: 36.8 % (ref 36.0–46.0)
Hemoglobin: 11.9 g/dL — ABNORMAL LOW (ref 12.0–15.0)
MCHC: 32.2 g/dL (ref 30.0–36.0)
MCV: 88.2 fl (ref 78.0–100.0)
Monocytes Absolute: 0.5 10*3/uL (ref 0.1–1.0)
Neutro Abs: 6.1 10*3/uL (ref 1.4–7.7)
RBC: 4.18 Mil/uL (ref 3.87–5.11)

## 2011-06-10 LAB — HEPATIC FUNCTION PANEL
AST: 266 U/L — ABNORMAL HIGH (ref 0–37)
Alkaline Phosphatase: 459 U/L — ABNORMAL HIGH (ref 39–117)
Total Bilirubin: 1.9 mg/dL — ABNORMAL HIGH (ref 0.3–1.2)

## 2011-06-10 MED ORDER — PREDNISONE 10 MG PO TABS: 10.0000 mg | ORAL_TABLET | Freq: Every day | ORAL | Status: AC

## 2011-06-10 MED ORDER — SODIUM CHLORIDE 0.9 % IV SOLN
125.0000 mg | Freq: Once | INTRAVENOUS | Status: AC
  Administered 2011-06-10: 130 mg via INTRAMUSCULAR

## 2011-06-10 MED ORDER — NABUMETONE 500 MG PO TABS: 500.0000 mg | ORAL_TABLET | Freq: Two times a day (BID) | ORAL | Status: DC

## 2011-06-10 NOTE — Progress Notes (Signed)
Subjective:    Patient ID: Shelby Vazquez, female    DOB: 14-Dec-1950, 60 y.o.   MRN: [id number]  HPI Mrs. Soulliere presents with a 2 week h/o headache that is frontal and occiptal, pressure like in nature and severe. She is intolerant of sound and light. She has had nausea. She gets relief with ibuprofen 400-600 mg every 4 hours. The head ache has kept her up at night.  Also for two weaks intermittent myalgias but for the past several days she has had increased myalgias, felt feverish. Saturday she developed a macular erythematous pruritic rash which are circular and widely distributed. Today her face, forehead and ears have really broken out. Her anterior chest wall is also involved. Thre is mild periorbital edema from the rash. She has had no coryza, N/V, abdominal pain. She does feel weaker than usual - less stamina/energy. She just returned from a trip to the mountains. She had been in creeks and in the woods but denies any insect or tick bites, no significant water ingestion. None of her party has been ill. No exposure to new or undercooked foods.  Past Medical History  Diagnosis Date  . Chronic anxiety   . B12 deficiency   . IBS (irritable bowel syndrome)   . Osteopenia   . Hypertension   . Uterine prolapse   . Hyperlipidemia    Past Surgical History  Procedure Date  . Cataract extraction     right 01/2010, left may 2011  . Bunionectomy     Regal '2000   Family History  Problem Relation Age of Onset  . Cancer Father   . Cancer Maternal Aunt     breast   History   Social History  . Marital Status: Married    Spouse Name: N/A    Number of Children: N/A  . Years of Education: N/A   Occupational History  . Not on file.   Social History Main Topics  . Smoking status: Not on file  . Smokeless tobacco: Not on file  . Alcohol Use:   . Drug Use:   . Sexually Active:    Other Topics Concern  . 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  HEENT:  Negative for hearing loss, ear pain, congestion, neck stiffness and postnasal drip. Negative for sore throat or swallowing problems. Negative for dental complaints.   Eyes: Negative for vision loss or change in visual acuity.  Respiratory: Negative for chest tightness and wheezing.   Cardiovascular: Negative for chest pain and palpitation.  Gastrointestinal: No change in bowel habit. No bloating or gas. No reflux or indigestion Genitourinary: Negative for urgency, frequency, flank pain and difficulty urinating.  Musculoskeletal: Negative for myalgias, back pain, arthralgias and gait problem.  Neurological: Negative for dizziness, tremors, weakness and headaches.  Hematological: Negative for adenopathy.  Psychiatric/Behavioral: Negative for behavioral problems and dysphoric mood.       Objective:   Physical Exam Vitals noted - no fever, mild tachycardia Gen'l - WNWD white woman with a rash in no distress HEENT - periorbital edema mild, C&S w/o icterus, PERRLA Neck - supple Nodes - negative submental, cervical, axillary or inguinal regions  Chest- no CVAT Lungs - CTAP Cor- Regular tachycardia, no murmurs Abd - BS+, no tenderness, no HSM Ext - no deformity Derm - erythematous macular rash in discrete aresa with smooth borders, w/o vesicles or desquamation. Areas include thighs, torso, chest wall and face.  Lab Results  Component Value Date   WBC 8.3 06/10/2011   HGB 11.9* 06/10/2011   HCT 36.8 06/10/2011   PLT 300.0 06/10/2011   CHOL 144 11/19/2010   TRIG 107.0 11/19/2010   HDL 39.10 11/19/2010   ALT 503* 06/10/2011   AST 266* 06/10/2011   NA 134* 11/19/2010   K 3.7  11/19/2010   CL 96 11/19/2010   CREATININE 0.9 11/19/2010   BUN 15 11/19/2010   CO2 30 11/19/2010   TSH 2.30 11/19/2010          Assessment & Plan:  Rash and febrile illness - this does not look like erythema migrans, urticaria or erythema multiforme. Researched google images and UpToDate. The rash has an appearance most consistent with a viral exanthem. Lab results with a normal CBC w/ normal differential, negative ASO, elevated ESR and elevated transaminases suggests a viral illness, e.g. EBV vs CMV vs other. She is uncomfortable with pruritis.  Plan - solumedrol 125 mg IM           Prednisone burst and taper to start 8/21           No indication for antibiotics           Supportive care           ROV 2-3 days.           Acute viral titres: CMV, EBV

## 2011-06-10 NOTE — Telephone Encounter (Signed)
Pt's daughter called stating since leaving office today...her Left eye is significantly droopy and her right eye is blinking more than usual. She states she feels better than she did when she was in office. Shelby Vazquez states her face isnt droopy, just her Left eye.

## 2011-06-10 NOTE — Telephone Encounter (Signed)
Called patient: she is feeling better. She has been told that she appears to have a drooping right eye. She denies double vision, paresthesia of the face or muscle weakness, no change in speech, cognition or other neurologic symptoms.  Labs reveal elevated alk phos and elevated transaminases with normal WBC with a normal diff. Question of EBV or CMV viral infection with a viral exanthem.   Plan - no antibiotics           Take prednisone as directed to help with flare of rash            Carefully instructed that for any neurologic changes she needs to seek immediate attention.           Reserve relafen for headache.           Will call tomorrow.

## 2011-06-12 ENCOUNTER — Ambulatory Visit (INDEPENDENT_AMBULATORY_CARE_PROVIDER_SITE_OTHER): Payer: BC Managed Care – PPO | Admitting: Internal Medicine

## 2011-06-12 ENCOUNTER — Encounter: Payer: Self-pay | Admitting: Internal Medicine

## 2011-06-12 ENCOUNTER — Other Ambulatory Visit (INDEPENDENT_AMBULATORY_CARE_PROVIDER_SITE_OTHER): Payer: BC Managed Care – PPO

## 2011-06-12 VITALS — BP 120/80 | HR 100 | Temp 97.9°F | Resp 16

## 2011-06-12 DIAGNOSIS — B09 Unspecified viral infection characterized by skin and mucous membrane lesions: Secondary | ICD-10-CM

## 2011-06-12 DIAGNOSIS — R7402 Elevation of levels of lactic acid dehydrogenase (LDH): Secondary | ICD-10-CM

## 2011-06-12 DIAGNOSIS — R0789 Other chest pain: Secondary | ICD-10-CM

## 2011-06-12 DIAGNOSIS — R7401 Elevation of levels of liver transaminase levels: Secondary | ICD-10-CM

## 2011-06-12 DIAGNOSIS — R51 Headache: Secondary | ICD-10-CM

## 2011-06-12 DIAGNOSIS — R519 Headache, unspecified: Secondary | ICD-10-CM | POA: Insufficient documentation

## 2011-06-12 DIAGNOSIS — R29810 Facial weakness: Secondary | ICD-10-CM

## 2011-06-12 LAB — HEPATIC FUNCTION PANEL
ALT: 338 U/L — ABNORMAL HIGH (ref 0–35)
Total Bilirubin: 1 mg/dL (ref 0.3–1.2)
Total Protein: 7.7 g/dL (ref 6.0–8.3)

## 2011-06-12 LAB — SEDIMENTATION RATE: Sed Rate: 45 mm/hr — ABNORMAL HIGH (ref 0–22)

## 2011-06-12 NOTE — Assessment & Plan Note (Signed)
BP Readings from Last 3 Encounters:  06/10/11 130/88  11/19/10 118/80   OK control on current regimen

## 2011-06-12 NOTE — Assessment & Plan Note (Signed)
Patient has a h/o cluster headaches - the current headaches are not as severe, persistent or debilitating. But, it is a very painful headache. On exam she is neurologically intact except for paresthesia right face, drooping upper eyelid and slight facial droop. Laster, after the exam, the facial paresthesia right became more pronounced. New headache and facial droop raise a serious concern for CVA or intracranial mass.  Plan - ASA 325mg  once a day           Continue prednisone           MRI brain ASAP with & w/o contrast: r/o mass, stroke, inflammatory changes i.e. Meningitis.           Clsoe observation by family until diagnostic w/u complete.

## 2011-06-12 NOTE — Progress Notes (Signed)
  Subjective:    Patient ID: Shelby Vazquez, female    DOB: [birth year], 60 y.o.   MRN: [id number]  HPI Mrs. Gluth has been followed for evanescent erythematous macular rash and elevation in liver functions. She has been treated with solumedrol and then high dose prednisone burst and taper. She has had right lid lag by report. She has had some paresthesias right face. Last night, Aug 20-21 she had 20-40 severe chest pain across the precordium and she could not get comfortable. This did subside. She had a terrible headache across the frontal area. Relafen may have given some relief. Her rash is worse and pruritic. Lab at last visit returned with elevation in transaminase levels. She describes the chest pain as if it were a web of pain across her chest. No diaphoresis, SOB or radiation. She is now pain freee.  I have reviewed the patient's medical history in detail and updated the computerized patient record.    Review of Systems Review of Systems  Constitutional:  Negative for fever, chills, activity change and unexpected weight change.  HEENT:  Negative for hearing loss, ear pain, congestion, neck stiffness and postnasal drip. Negative for sore throat or swallowing problems. Negative for dental complaints.   Eyes: Negative for vision loss or change in visual acuity.  Respiratory: Negative for chest tightness and wheezing.   Cardiovascular: Negative for chest pain and palpitation. No decreased exercise tolerance Gastrointestinal: No change in bowel habit. No bloating or gas. No reflux or indigestion Genitourinary: Negative for urgency, frequency, flank pain and difficulty urinating.  Musculoskeletal: Negative for myalgias, back pain, arthralgias and gait problem.  Neurological: Negative for dizziness, tremors, weakness and headaches.  Hematological: Negative for adenopathy.  Psychiatric/Behavioral: Negative for behavioral problems and dysphoric mood.       Objective:   Physical Exam Vitals  reviewed - normal Gen'l - WNWD white woman in no distress HEENT - C&S clear, PERRLA, minimal swelling at right upper eyelid. Chest - CTAP Cor - 2+ radial pulses, No JVD, no carotid bruit. Precordium quiet. RRR no murmur Abd - BS+ x 4, no palpable liver edge or spleen. No tender to deep palpation. No scleral icterus. Neuro -A&O x 52m, CN II-XII - mild right upper eyelid droop but she can move her face/wrinkle her brow, PERRLA, EOMI, no visual field cuts covering alternate eyes, MS normal, DTRs normal       Assessment & Plan:  Erythematous macular rash and elevated liver functions - very suggestive of CMV or EBV viral infection.  Plan - continue steroid burst and taper           Follow-up liver functions today, if elevated will need further lab, U/S abdomen           Acute EBV and CMV titre  Addendum- LFTs are coming down.

## 2011-06-12 NOTE — Assessment & Plan Note (Signed)
Two week h/o severe headache with a frontal distribution and over the past several days occipital involvement. She has had no focal neurologic signs or symptoms. She has mild photophobia and hyperacusia. No N/V. She does get relief with NSAID but the duration is short These symptoms are suggestive of a muscle tension type headache.   Plan - relafen 500 mg q 12 hours as needed for headache           For unremitting Headache will consult Dr. Clarisse Gouge.

## 2011-06-13 ENCOUNTER — Ambulatory Visit
Admission: RE | Admit: 2011-06-13 | Discharge: 2011-06-13 | Disposition: A | Discharge status: Home / Self Care | Payer: BC Managed Care – PPO | Source: Ambulatory Visit | Attending: Internal Medicine | Admitting: Internal Medicine

## 2011-06-13 DIAGNOSIS — R29810 Facial weakness: Secondary | ICD-10-CM

## 2011-06-13 DIAGNOSIS — R51 Headache: Secondary | ICD-10-CM

## 2011-06-13 LAB — EPSTEIN-BARR VIRUS NUCLEAR ANTIGEN ANTIBODY, IGG: EBV NA IgG: 3.73 {ISR} — ABNORMAL HIGH

## 2011-06-13 MED ORDER — GADOBENATE DIMEGLUMINE 529 MG/ML IV SOLN
10.0000 mL | Freq: Once | INTRAVENOUS | Status: AC | PRN
  Administered 2011-06-13: 10 mL via INTRAVENOUS

## 2011-06-14 ENCOUNTER — Telehealth: Payer: Self-pay | Admitting: Internal Medicine

## 2011-06-14 NOTE — Telephone Encounter (Signed)
Pt informed

## 2011-06-14 NOTE — Telephone Encounter (Signed)
LFTs coming down, EBV titre definitely positive, CMV is also positive. MRI brain is normal.  Plan - complete steroid burst and taper           Rest, fluids, APAP           Continue relafen for HA prn

## 2011-06-17 ENCOUNTER — Other Ambulatory Visit: Payer: Self-pay | Admitting: Internal Medicine

## 2011-06-17 ENCOUNTER — Telehealth: Payer: Self-pay

## 2011-06-17 DIAGNOSIS — R7989 Other specified abnormal findings of blood chemistry: Secondary | ICD-10-CM

## 2011-06-17 DIAGNOSIS — R21 Rash and other nonspecific skin eruption: Secondary | ICD-10-CM

## 2011-06-17 NOTE — Telephone Encounter (Signed)
done

## 2011-06-17 NOTE — Telephone Encounter (Signed)
Pt is req a letter for work. She is taking some time off starting Wednesday and will return to work 9/6 if she is feeling better if not better will f/u in the office and take the rest of that week off. She is req letter to say please excuse patient due to medical reasons (please do not specify diagnosis) starting Wed 8/29 with return date to be determined.   Please give letter to daughter to deliver to patient.

## 2011-06-19 ENCOUNTER — Other Ambulatory Visit (INDEPENDENT_AMBULATORY_CARE_PROVIDER_SITE_OTHER): Payer: BC Managed Care – PPO

## 2011-06-19 ENCOUNTER — Telehealth: Payer: Self-pay | Admitting: Internal Medicine

## 2011-06-19 DIAGNOSIS — R7989 Other specified abnormal findings of blood chemistry: Secondary | ICD-10-CM

## 2011-06-19 DIAGNOSIS — R21 Rash and other nonspecific skin eruption: Secondary | ICD-10-CM

## 2011-06-19 LAB — HEPATIC FUNCTION PANEL
ALT: 58 U/L — ABNORMAL HIGH (ref 0–35)
AST: 27 U/L (ref 0–37)
Albumin: 3.5 g/dL (ref 3.5–5.2)
Alkaline Phosphatase: 181 U/L — ABNORMAL HIGH (ref 39–117)

## 2011-06-19 LAB — SEDIMENTATION RATE: Sed Rate: 25 mm/hr — ABNORMAL HIGH (ref 0–22)

## 2011-06-19 NOTE — Telephone Encounter (Signed)
Sed rate down to 25-almost normal. Liver functions almost down to normal - real improvement. Will need convalescent EBV, CMV titres in 3 weeks order entered for Sept 12

## 2011-06-19 NOTE — Telephone Encounter (Signed)
Pt informed

## 2011-07-03 ENCOUNTER — Other Ambulatory Visit (INDEPENDENT_AMBULATORY_CARE_PROVIDER_SITE_OTHER): Payer: BC Managed Care – PPO

## 2011-07-03 DIAGNOSIS — B199 Unspecified viral hepatitis without hepatic coma: Secondary | ICD-10-CM

## 2011-07-17 ENCOUNTER — Telehealth: Payer: Self-pay | Admitting: *Deleted

## 2011-07-17 NOTE — Telephone Encounter (Signed)
Daughter req results of labs. Ami called last week and results were faxed and put on MD's desk for review. They have not resulted in EPIC chart yet.   Pt continues to c/o severe fatigue, some itching and headaches off and on but over all very slowly getting better. Patient is requesting to know is expected recovery time for virus?

## 2011-07-17 NOTE — Telephone Encounter (Signed)
Patient informed. 

## 2011-07-17 NOTE — Telephone Encounter (Signed)
EBV titre is coming down. CMV - first test was not quantitated, just positive, follow up is still positive. Helps confirm that this was most likely a viral infection. EBV can definitely have a slow recovery period - many weeks kind of like mono.

## 2011-08-13 ENCOUNTER — Encounter: Payer: Self-pay | Admitting: Internal Medicine

## 2011-09-09 ENCOUNTER — Other Ambulatory Visit: Payer: Self-pay | Admitting: *Deleted

## 2011-09-09 MED ORDER — AZITHROMYCIN 250 MG PO TABS: ORAL_TABLET | ORAL | Status: AC

## 2011-09-09 MED ORDER — PROMETHAZINE-CODEINE 6.25-10 MG/5ML PO SYRP: 5.0000 mL | ORAL_SOLUTION | ORAL | Status: DC | PRN

## 2011-09-09 NOTE — Telephone Encounter (Signed)
Pt is out of town for her mother's funeral and is c/o productive cough with green mucus X 2 days. She is requesting cough med and abx. Please advise. Call in to [facility] [phone].

## 2011-09-09 NOTE — Telephone Encounter (Signed)
rxs phoned in to [facility] in [place], [place] at [phone]. Pt informed

## 2011-09-09 NOTE — Telephone Encounter (Signed)
Ok Prometh/codeine syrup and zpak per AVP in Dr. Debby Bud' absence.

## 2011-09-18 ENCOUNTER — Telehealth: Payer: Self-pay | Admitting: *Deleted

## 2011-09-18 MED ORDER — PROMETHAZINE-CODEINE 6.25-10 MG/5ML PO SYRP: 5.0000 mL | ORAL_SOLUTION | ORAL | Status: AC | PRN

## 2011-09-18 NOTE — Telephone Encounter (Signed)
Ok for refill 8 oz 1 add'l refill

## 2011-09-18 NOTE — Telephone Encounter (Signed)
Pt c/o hacking cough, she notices symptoms more so at night with coughing spells. Pt is requesting refill on promethazine codeine cough syrup. Please Advise.

## 2011-10-03 ENCOUNTER — Other Ambulatory Visit: Payer: Self-pay | Admitting: Internal Medicine

## 2011-10-04 MED ORDER — ALPRAZOLAM 0.5 MG PO TABS: 0.5000 mg | ORAL_TABLET | Freq: Every day | ORAL | Status: DC

## 2011-10-16 ENCOUNTER — Telehealth: Payer: Self-pay | Admitting: *Deleted

## 2011-10-16 NOTE — Telephone Encounter (Signed)
Refill request for promethazine codeine cough syrup sig: take 1 teaspoonful by mouth q 4 hours

## 2011-11-05 ENCOUNTER — Ambulatory Visit (INDEPENDENT_AMBULATORY_CARE_PROVIDER_SITE_OTHER): Payer: BC Managed Care – PPO | Admitting: Internal Medicine

## 2011-11-05 ENCOUNTER — Other Ambulatory Visit (INDEPENDENT_AMBULATORY_CARE_PROVIDER_SITE_OTHER): Payer: BC Managed Care – PPO

## 2011-11-05 DIAGNOSIS — L299 Pruritus, unspecified: Secondary | ICD-10-CM

## 2011-11-05 DIAGNOSIS — I10 Essential (primary) hypertension: Secondary | ICD-10-CM

## 2011-11-05 DIAGNOSIS — B199 Unspecified viral hepatitis without hepatic coma: Secondary | ICD-10-CM

## 2011-11-05 DIAGNOSIS — B178 Other specified acute viral hepatitis: Secondary | ICD-10-CM

## 2011-11-05 DIAGNOSIS — F341 Dysthymic disorder: Secondary | ICD-10-CM

## 2011-11-05 DIAGNOSIS — K589 Irritable bowel syndrome without diarrhea: Secondary | ICD-10-CM

## 2011-11-05 DIAGNOSIS — F329 Major depressive disorder, single episode, unspecified: Secondary | ICD-10-CM

## 2011-11-06 DIAGNOSIS — L299 Pruritus, unspecified: Secondary | ICD-10-CM | POA: Insufficient documentation

## 2011-11-06 DIAGNOSIS — F331 Major depressive disorder, recurrent, moderate: Secondary | ICD-10-CM | POA: Insufficient documentation

## 2011-11-06 LAB — HEPATIC FUNCTION PANEL
ALT: 19 U/L (ref 0–35)
Albumin: 4.5 g/dL (ref 3.5–5.2)
Bilirubin, Direct: 0.1 mg/dL (ref 0.0–0.3)
Total Protein: 7.5 g/dL (ref 6.0–8.3)

## 2011-11-06 MED ORDER — SERTRALINE HCL 100 MG PO TABS: 100.0000 mg | ORAL_TABLET | Freq: Every day | ORAL | Status: DC

## 2011-11-06 NOTE — Assessment & Plan Note (Signed)
Many issues coming together causing major depression and emotional distress. Primary among these issues is unresolved grief: for her sister, her best friend and most recently her mother. 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 Reviewed the role of medications and the importance of talk therapy.  Plan - change from lexapro to sertraline with the intention of pushing the dose for better relief of vegative symptoms           Referral to [facility]: Drs. Dellia Cloud or Noe Gens or Ms. Bond           Close follow-up.

## 2011-11-06 NOTE — Assessment & Plan Note (Signed)
She has continued her medical therapy as needed. There have not been a lot of symptoms of active IBS reported today.

## 2011-11-06 NOTE — Progress Notes (Signed)
  Subjective:    Patient ID: Shelby Vazquez, female    DOB: 1950-12-05, 61 y.o.   MRN: [id number]  HPI [name] was evaluated in September for pruritis and was diagnosed with a viral hepatitis, either EBV or CMV virus, with positive serologies. Subsequent testing revealed a normalization of liver functions and a drop in EBV titre. CMV acute titre was not quantitated so the value of a convalescent titre was nil. Her symptoms of low energy and pruritis did improve.  She presents today for overwhelming fatigue and recurrent severe pruritis. She has not been sleeping well due to pruritis and diffuse myalgias. She cannot get comfortable in her own skin. 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 There have been several episodes when she could not recall names, words and had locational problems, where she was forgetful and unfocused. Due to all of her responsibilities she has not had time for running or walking which serves as a major stress reducer for her. All of these factors have left her distraught at times.  I have reviewed the patient's medical history in detail and updated the computerized patient record.    Review of Systems System review is negative for any constitutional, cardiac, pulmonary, GI or neuro symptoms or complaints other than as described in the HPI.     Objective:   Physical Exam Filed Vitals:   11/05/11 1624  BP: 120/82  Pulse: 81  Temp: 97 F (36.1 C)   Gen'l- WNWD white woman who is emotionally upset but in acute medical distress HEENT- C&S clear w/o icterus Cor- RRR Pulm - normal respirations Psych - emotional/tearful, admits to anhedonia, perseveration, poor sleep,  change in appetite Derm- face, neck, upper chest w/o color change or lesions.  Labs pending: LFTs, EBV and CMV antibody titres.        Assessment & Plan:

## 2011-11-06 NOTE — Assessment & Plan Note (Signed)
BP Readings from Last 3 Encounters:  11/05/11 120/82  06/12/11 120/80  06/10/11 130/88

## 2011-11-06 NOTE — Assessment & Plan Note (Signed)
Recurrent pruritis. This may be related to depression (neurogenic pruritis) vs recurrent elevation in transaminases.  Plan - lab: LFTs, convalescent EBV,CMV titres           Continue H2 blocker (zantac, etc) and antihistamines (claritin in the day,. Benadryl at night)           Tepid baking soda baths as needed.

## 2011-11-07 LAB — EPSTEIN-BARR VIRUS VCA ANTIBODY PANEL: EBV VCA IgG: 5 {ISR} — ABNORMAL HIGH

## 2011-11-10 ENCOUNTER — Other Ambulatory Visit: Payer: Self-pay | Admitting: Internal Medicine

## 2011-11-10 ENCOUNTER — Encounter: Payer: Self-pay | Admitting: Internal Medicine

## 2011-11-12 ENCOUNTER — Ambulatory Visit (INDEPENDENT_AMBULATORY_CARE_PROVIDER_SITE_OTHER): Payer: BC Managed Care – PPO | Admitting: Psychology

## 2011-11-12 ENCOUNTER — Ambulatory Visit: Payer: BC Managed Care – PPO | Admitting: Internal Medicine

## 2011-11-12 DIAGNOSIS — F411 Generalized anxiety disorder: Secondary | ICD-10-CM

## 2011-11-13 ENCOUNTER — Telehealth: Payer: Self-pay | Admitting: *Deleted

## 2011-11-13 NOTE — Telephone Encounter (Signed)
Called and left a message: I want the patient seen but if they, ID, do not feel this is appropriate for them I would like to hear from one of the doctors. Left backdoor number

## 2011-11-13 NOTE — Telephone Encounter (Signed)
Spoke with Asher Muir at Infectious Diseases & pt does not have hepatitis. They would like to know if they should be treating her for fatigue or something else? Please advise.

## 2011-11-21 ENCOUNTER — Other Ambulatory Visit: Payer: Self-pay | Admitting: *Deleted

## 2011-11-21 MED ORDER — RABEPRAZOLE SODIUM 20 MG PO TBEC: 20.0000 mg | DELAYED_RELEASE_TABLET | Freq: Every day | ORAL | Status: DC

## 2011-11-21 MED ORDER — METOCLOPRAMIDE HCL 5 MG PO TABS: 5.0000 mg | ORAL_TABLET | Freq: Two times a day (BID) | ORAL | Status: DC

## 2011-11-21 MED ORDER — SIMVASTATIN 40 MG PO TABS: 40.0000 mg | ORAL_TABLET | Freq: Every day | ORAL | Status: DC

## 2011-11-21 MED ORDER — NABUMETONE 500 MG PO TABS: 500.0000 mg | ORAL_TABLET | Freq: Two times a day (BID) | ORAL | Status: AC

## 2011-11-21 MED ORDER — CILIDINIUM-CHLORDIAZEPOXIDE 2.5-5 MG PO CAPS: 1.0000 | ORAL_CAPSULE | Freq: Two times a day (BID) | ORAL | Status: DC | PRN

## 2011-11-21 MED ORDER — TRIAMTERENE-HCTZ 37.5-25 MG PO CAPS: 1.0000 | ORAL_CAPSULE | ORAL | Status: DC

## 2011-11-21 NOTE — Telephone Encounter (Signed)
Rx[s] Done. 

## 2011-11-22 ENCOUNTER — Other Ambulatory Visit: Payer: Self-pay | Admitting: Internal Medicine

## 2011-11-27 ENCOUNTER — Ambulatory Visit (INDEPENDENT_AMBULATORY_CARE_PROVIDER_SITE_OTHER): Payer: BC Managed Care – PPO | Admitting: Psychology

## 2011-11-27 DIAGNOSIS — F411 Generalized anxiety disorder: Secondary | ICD-10-CM

## 2011-12-02 ENCOUNTER — Other Ambulatory Visit: Payer: Self-pay | Admitting: Internal Medicine

## 2011-12-02 ENCOUNTER — Telehealth: Payer: Self-pay | Admitting: Internal Medicine

## 2011-12-02 DIAGNOSIS — M791 Myalgia, unspecified site: Secondary | ICD-10-CM

## 2011-12-02 NOTE — Telephone Encounter (Signed)
Called Shelby Vazquez. She has been seeing Dr. Dellia Cloud and has been on sertraline, instead of lexapro. She reports that she is 100% better, but realizes that she needs to keep working on it.  Relayed to her Dr. Lattie Haw opinion that she does not, on review of labs and records, have an active infectious disease process going on.  She continues to have myalgias and fatigue. Plan - r/o rheum and connective tissue disease: ANA, CRP, RF, CCP

## 2011-12-03 ENCOUNTER — Other Ambulatory Visit (INDEPENDENT_AMBULATORY_CARE_PROVIDER_SITE_OTHER): Payer: BC Managed Care – PPO

## 2011-12-03 DIAGNOSIS — IMO0001 Reserved for inherently not codable concepts without codable children: Secondary | ICD-10-CM

## 2011-12-03 DIAGNOSIS — M791 Myalgia, unspecified site: Secondary | ICD-10-CM

## 2011-12-03 LAB — CK: Total CK: 232 U/L — ABNORMAL HIGH (ref 7–177)

## 2011-12-03 LAB — HIGH SENSITIVITY CRP: CRP, High Sensitivity: 0.3 mg/L (ref 0.000–5.000)

## 2011-12-04 LAB — ANA: Anti Nuclear Antibody(ANA): NEGATIVE

## 2011-12-04 LAB — RHEUMATOID FACTOR: Rhuematoid fact SerPl-aCnc: <10 IU/mL (ref <=14)

## 2011-12-05 LAB — CYCLIC CITRUL PEPTIDE ANTIBODY, IGG: Cyclic Citrullin Peptide Ab: <2 U/mL (ref 0.0–5.0)

## 2011-12-11 ENCOUNTER — Encounter: Payer: Self-pay | Admitting: Internal Medicine

## 2011-12-11 ENCOUNTER — Ambulatory Visit (INDEPENDENT_AMBULATORY_CARE_PROVIDER_SITE_OTHER): Payer: BC Managed Care – PPO | Admitting: Psychology

## 2011-12-11 DIAGNOSIS — F411 Generalized anxiety disorder: Secondary | ICD-10-CM

## 2012-01-01 ENCOUNTER — Other Ambulatory Visit: Payer: Self-pay | Admitting: *Deleted

## 2012-01-01 NOTE — Telephone Encounter (Signed)
PA approval received & faxed to pharmacy.

## 2012-01-01 NOTE — Telephone Encounter (Signed)
Completed PA form & faxed to [facility] @ [phone]

## 2012-01-20 ENCOUNTER — Other Ambulatory Visit (INDEPENDENT_AMBULATORY_CARE_PROVIDER_SITE_OTHER): Payer: BC Managed Care – PPO

## 2012-01-20 ENCOUNTER — Encounter: Payer: Self-pay | Admitting: Internal Medicine

## 2012-01-20 ENCOUNTER — Ambulatory Visit (INDEPENDENT_AMBULATORY_CARE_PROVIDER_SITE_OTHER): Payer: BC Managed Care – PPO | Admitting: Internal Medicine

## 2012-01-20 VITALS — BP 110/78 | HR 97 | Temp 97.5°F | Resp 16 | Ht 64.5 in | Wt 146.5 lb

## 2012-01-20 DIAGNOSIS — K589 Irritable bowel syndrome without diarrhea: Secondary | ICD-10-CM

## 2012-01-20 DIAGNOSIS — F411 Generalized anxiety disorder: Secondary | ICD-10-CM

## 2012-01-20 DIAGNOSIS — L299 Pruritus, unspecified: Secondary | ICD-10-CM

## 2012-01-20 DIAGNOSIS — M899 Disorder of bone, unspecified: Secondary | ICD-10-CM

## 2012-01-20 DIAGNOSIS — Z0001 Encounter for general adult medical examination with abnormal findings: Secondary | ICD-10-CM | POA: Insufficient documentation

## 2012-01-20 DIAGNOSIS — Z0000 Encounter for general adult medical examination without abnormal findings: Secondary | ICD-10-CM

## 2012-01-20 DIAGNOSIS — E785 Hyperlipidemia, unspecified: Secondary | ICD-10-CM

## 2012-01-20 DIAGNOSIS — M949 Disorder of cartilage, unspecified: Secondary | ICD-10-CM

## 2012-01-20 DIAGNOSIS — F329 Major depressive disorder, single episode, unspecified: Secondary | ICD-10-CM

## 2012-01-20 DIAGNOSIS — E538 Deficiency of other specified B group vitamins: Secondary | ICD-10-CM

## 2012-01-20 DIAGNOSIS — I10 Essential (primary) hypertension: Secondary | ICD-10-CM

## 2012-01-20 LAB — HEPATIC FUNCTION PANEL
ALT: 18 U/L (ref 0–35)
Albumin: 4.3 g/dL (ref 3.5–5.2)
Total Protein: 7.3 g/dL (ref 6.0–8.3)

## 2012-01-20 LAB — COMPREHENSIVE METABOLIC PANEL
ALT: 18 U/L (ref 0–35)
AST: 27 U/L (ref 0–37)
Alkaline Phosphatase: 45 U/L (ref 39–117)
CO2: 28 mEq/L (ref 19–32)
Creatinine, Ser: 1 mg/dL (ref 0.4–1.2)
GFR: 62.79 mL/min (ref >60.00)
Sodium: 137 mEq/L (ref 135–145)
Total Bilirubin: 0.4 mg/dL (ref 0.3–1.2)

## 2012-01-20 LAB — VITAMIN B12: Vitamin B-12: 864 pg/mL (ref 211–911)

## 2012-01-20 LAB — LIPID PANEL
HDL: 40.3 mg/dL (ref >39.00)
LDL Cholesterol: 74 mg/dL (ref 0–99)
Total CHOL/HDL Ratio: 3
Triglycerides: 134 mg/dL (ref 0.0–149.0)
VLDL: 26.8 mg/dL (ref 0.0–40.0)

## 2012-01-20 NOTE — Assessment & Plan Note (Signed)
Excellent results with LDL much better than goal of 100 or less. Liver functions were normal.  Plan - Continue present dose of zocor (simvastatin)

## 2012-01-20 NOTE — Assessment & Plan Note (Signed)
Lab Results  Component Value Date   VITAMINB12 864 01/20/2012   In normal range. May consider a replacement holiday for 6 months with follow-up lab at that time.

## 2012-01-20 NOTE — Assessment & Plan Note (Signed)
Doing much better. Tolerating zoloft (sertraline) at present dose. She has seen Dr. Dellia Cloud which she found helpful.  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 Dellia Cloud.

## 2012-01-20 NOTE — Assessment & Plan Note (Signed)
Stable with controlled symptoms. NO change in medications

## 2012-01-20 NOTE — Assessment & Plan Note (Signed)
No c/o pruritus at today's visit.

## 2012-01-20 NOTE — Assessment & Plan Note (Signed)
Much improved. See entry under anxiety.

## 2012-01-20 NOTE — Progress Notes (Signed)
Subjective:    Patient ID: Shelby Vazquez, female    DOB: 1951-04-10, 61 y.o.   MRN: [id number]  HPI Mrs. Yip presents for annual medical exam. She reports that she has been feeling better: better about school, less fatigue and body pain, spirits are doing better although she hit bottom in February. She has no medical complaints at today's visit.  Past Medical History  Diagnosis Date  . Chronic anxiety   . B12 deficiency   . IBS (irritable bowel syndrome)   . Osteopenia   . Hypertension   . Uterine prolapse   . Hyperlipidemia   . Vocal cord paralysis, bilateral partial 2001    evaluated at [facility] - did not require surgery  . 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 Cataract extraction     right 01/2010, left may 2011  . Bunionectomy     Regal '2000  . Dilation and curettage of uterus '77    for molar pregnancy excision   Family History  Problem Relation Age of Onset  . Cancer Father   . Cancer Maternal Aunt     breast  . Heart disease Mother   . Hyperlipidemia Mother   . Heart disease Maternal Grandmother   . Heart disease Maternal Grandfather   . Heart disease Paternal Grandmother   . Heart disease Paternal Grandfather   . Diabetes Neg Hx    History   Social History  . Marital Status: Married    Spouse Name: N/A    Number of Children: 2  . Years of Education: 16   Occupational History  . teacher    Social History Main Topics  . Smoking status: Never Smoker   . Smokeless tobacco: Never Used  . Alcohol Use: Yes     twice a year  . Drug Use: No  . Sexually Active: Yes -- Female partner(s)   Other Topics Concern  . Not on file   Social History Narrative   [employer]. Married 1973. 2 daughters '74, '80; 5 grandchildren. 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 No h/o physical or sexual abuse. Marriage in good health        Review of Systems Constitutional:  Negative for fever, chills, activity change and unexpected weight change.  HEENT:  Negative for hearing loss, ear pain, congestion, neck stiffness and postnasal drip. Negative for sore throat or swallowing problems. Negative for dental complaints.   Eyes: Negative for vision loss or change in visual acuity.  Respiratory: Negative for chest tightness and wheezing. Negative for DOE.   Cardiovascular: Negative for chest pain or palpitations. No decreased exercise tolerance Gastrointestinal: No change in bowel habit. No bloating or gas. No reflux or indigestion Genitourinary: Negative for urgency, frequency, flank pain and difficulty urinating.  Musculoskeletal: Negative for myalgias, back pain, arthralgias and gait problem.  Neurological: Negative for dizziness, tremors, weakness and headaches.  Hematological: Negative for adenopathy.  Psychiatric/Behavioral: Negative for behavioral problems and dysphoric mood.       Objective:   Physical Exam Filed Vitals:   01/20/12 0908  BP: 110/78  Pulse: 97  Temp: 97.5 F (36.4 C)  Resp: 16   Wt Readings from Last 3 Encounters:  01/20/12 146 lb 8 oz (66.452 kg)  11/05/11 146 lb (66.225 kg)  06/10/11 144 lb (65.318 kg)    Gen'l: well nourished, well developed white woman  in no distress HEENT - [place]/AT, EACs/TMs normal, oropharynx with native dentition in good condition, no buccal or palatal lesions, posterior pharynx clear, mucous membranes moist. C&S clear, PERRLA, fundi - normal Neck - supple, no thyromegaly Nodes- negative submental, cervical, supraclavicular regions Chest - no deformity, no CVAT Lungs - cleat without rales, wheezes. No increased work of breathing Breast - deferred to gyn Cardiovascular - regular rate and rhythm, quiet precordium, no murmurs, rubs or gallops, 2+ radial, DP and PT pulses Abdomen - BS+ x 4, no HSM, no guarding or rebound or tenderness Pelvic - deferred to  gyn Rectal - deferred to gyn Extremities - no clubbing, cyanosis, edema or deformity.  Neuro - A&O x 3, CN II-XII normal, motor strength normal and equal, DTRs 2+ and symmetrical biceps, radial, and patellar tendons. Cerebellar - no tremor, no rigidity, fluid movement and normal gait. Derm - Head, neck, back, abdomen and extremities without suspicious lesions  Lab Results  Component Value Date   WBC 8.3 06/10/2011   HGB 11.9* 06/10/2011   HCT 36.8 06/10/2011   PLT 300.0 06/10/2011   GLUCOSE 97 01/20/2012   CHOL 141 01/20/2012   TRIG 134.0 01/20/2012   HDL 40.30 01/20/2012   LDLCALC 74 01/20/2012        ALT 18 01/20/2012   AST 27 01/20/2012             K 4.0 01/20/2012   CL 100 01/20/2012   CREATININE 1.0 01/20/2012   BUN 18 01/20/2012   CO2 28 01/20/2012   TSH 2.30 11/19/2010         Assessment & Plan:

## 2012-01-20 NOTE — Assessment & Plan Note (Signed)
Osteopenia. No indication for medical therapy.  Plan - calcium 1200 mg total daily intake diet + supplement           Vitamin D 800-1,000 iu daily           Repeat DXA in '14

## 2012-01-20 NOTE — Assessment & Plan Note (Signed)
BP Readings from Last 3 Encounters:  01/20/12 110/78  11/05/11 120/82  06/12/11 120/80   Excellent control on present medications. Renal function and electrolytes are normal.   Plan - No changes at this time

## 2012-01-20 NOTE — Assessment & Plan Note (Signed)
Interval medical history - very positive: dealing with psychologic issues in a better way and feels better; malaise and fatigue is better; itching is better. Physical exam sans pelvic and breast is normal. Lab results are in normal range. She is current with gyn care and she is current for breast and colon cancer screening. Immunizations: current re: tetanus, due for shingles and pneumonia vaccine.  In summary - a very nice woman who appears medically stable and better than she has been for a while. She will return as needed, for lab in 6 months and for general exam in 12-18 months.

## 2012-03-26 ENCOUNTER — Other Ambulatory Visit: Payer: Self-pay

## 2012-03-27 NOTE — Telephone Encounter (Signed)
Pt called again requesting Rx refill, please advise.

## 2012-03-28 MED ORDER — ALPRAZOLAM 0.5 MG PO TABS: 0.5000 mg | ORAL_TABLET | Freq: Every day | ORAL | Status: DC

## 2012-03-30 MED ORDER — ALPRAZOLAM 0.5 MG PO TABS: 0.5000 mg | ORAL_TABLET | Freq: Every day | ORAL | Status: DC

## 2012-03-30 NOTE — Telephone Encounter (Signed)
Rx phoned in (pharmacist - [facility]), pt informed via VM

## 2012-03-30 NOTE — Telephone Encounter (Signed)
Addended by: Anselm Jungling on: 03/30/2012 09:45 AM   Modules accepted: Orders

## 2012-04-01 ENCOUNTER — Other Ambulatory Visit: Payer: Self-pay | Admitting: *Deleted

## 2012-04-01 MED ORDER — ALPRAZOLAM 0.5 MG PO TABS: 0.5000 mg | ORAL_TABLET | Freq: Every evening | ORAL | Status: DC | PRN

## 2012-04-01 NOTE — Telephone Encounter (Signed)
Ok x 5 

## 2012-04-01 NOTE — Telephone Encounter (Signed)
Rx refill alprazolam called to [facility]. Left message on home VM of this

## 2012-04-01 NOTE — Telephone Encounter (Signed)
Patient request refill on medication Alprazolam? OK?

## 2012-04-02 ENCOUNTER — Ambulatory Visit (INDEPENDENT_AMBULATORY_CARE_PROVIDER_SITE_OTHER): Payer: BC Managed Care – PPO | Admitting: Psychology

## 2012-04-02 DIAGNOSIS — F411 Generalized anxiety disorder: Secondary | ICD-10-CM

## 2012-04-17 ENCOUNTER — Ambulatory Visit (INDEPENDENT_AMBULATORY_CARE_PROVIDER_SITE_OTHER): Payer: BC Managed Care – PPO | Admitting: Psychology

## 2012-04-17 DIAGNOSIS — F411 Generalized anxiety disorder: Secondary | ICD-10-CM

## 2012-05-26 ENCOUNTER — Other Ambulatory Visit: Payer: Self-pay | Admitting: Internal Medicine

## 2012-05-28 ENCOUNTER — Other Ambulatory Visit: Payer: Self-pay | Admitting: Internal Medicine

## 2012-05-29 ENCOUNTER — Other Ambulatory Visit: Payer: Self-pay | Admitting: Internal Medicine

## 2012-06-23 ENCOUNTER — Telehealth: Payer: Self-pay | Admitting: Internal Medicine

## 2012-06-23 ENCOUNTER — Telehealth: Payer: Self-pay | Admitting: *Deleted

## 2012-06-23 ENCOUNTER — Other Ambulatory Visit: Payer: Self-pay | Admitting: Internal Medicine

## 2012-06-23 ENCOUNTER — Other Ambulatory Visit: Payer: Self-pay | Admitting: *Deleted

## 2012-06-23 ENCOUNTER — Other Ambulatory Visit (INDEPENDENT_AMBULATORY_CARE_PROVIDER_SITE_OTHER): Payer: BC Managed Care – PPO

## 2012-06-23 DIAGNOSIS — N3000 Acute cystitis without hematuria: Secondary | ICD-10-CM

## 2012-06-23 LAB — URINALYSIS, ROUTINE W REFLEX MICROSCOPIC
Bilirubin Urine: NEGATIVE
Ketones, ur: NEGATIVE
Nitrite: POSITIVE
Total Protein, Urine: NEGATIVE

## 2012-06-23 MED ORDER — CIPROFLOXACIN HCL 250 MG PO TABS: 250.0000 mg | ORAL_TABLET | Freq: Two times a day (BID) | ORAL | Status: AC

## 2012-06-23 MED ORDER — PROMETHAZINE HCL 25 MG RE SUPP: 25.0000 mg | Freq: Four times a day (QID) | RECTAL | Status: DC | PRN

## 2012-06-23 NOTE — Telephone Encounter (Signed)
Per Dr. Debby Bud ok to order UA and culture.

## 2012-06-23 NOTE — Telephone Encounter (Signed)
Per Dr. Debby Bud- call in Cipro 250 mg 1 po bid x 7 days. Done. Pt informed

## 2012-06-23 NOTE — Telephone Encounter (Signed)
Daughter called - Mrs. Gilcrest has nausea and may not be able to keep down meds.  Plan - trial of phenergan 25 mg supps q 6.  After 30-60 minutes start clear liquids, APAP 1,000 mg tid, and start the cipro. If this fails - go to ED for IV meds, fluids.

## 2012-06-23 NOTE — Telephone Encounter (Signed)
Pt now c/o vomiting and fever. She left work and is home. Please advise on UA results and advisement.

## 2012-06-23 NOTE — Telephone Encounter (Signed)
Pt c/o nausea, urinary frequency, abd discomfort and bloating along with generalized body aches x 2 days. She requests order for UA. 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 She would like her daughter to p/u a urine cup and bring specimen back to lab after collection. Please advise.

## 2012-06-25 LAB — URINE CULTURE: Colony Count: >=100000

## 2012-08-24 ENCOUNTER — Telehealth: Payer: Self-pay | Admitting: *Deleted

## 2012-08-24 ENCOUNTER — Other Ambulatory Visit (INDEPENDENT_AMBULATORY_CARE_PROVIDER_SITE_OTHER): Payer: BC Managed Care – PPO

## 2012-08-24 DIAGNOSIS — E538 Deficiency of other specified B group vitamins: Secondary | ICD-10-CM

## 2012-08-24 DIAGNOSIS — R350 Frequency of micturition: Secondary | ICD-10-CM

## 2012-08-24 LAB — URINALYSIS, ROUTINE W REFLEX MICROSCOPIC
Bilirubin Urine: NEGATIVE
Ketones, ur: NEGATIVE
Urine Glucose: NEGATIVE
Urobilinogen, UA: 0.2 (ref 0.0–1.0)

## 2012-08-24 NOTE — Telephone Encounter (Signed)
PATIENT NOTIFIED OF ORDER PLACED FOR U/A TO BE DONE IN LAB HERE AT [facility].

## 2012-08-24 NOTE — Telephone Encounter (Signed)
Order entered for U/A - may come to the office any time

## 2012-08-24 NOTE — Telephone Encounter (Signed)
Pt c/o urinary pressure and frequency X 3 days. She requests order for U/A. Please advise.

## 2012-08-25 LAB — VITAMIN B12: Vitamin B-12: 546 pg/mL (ref 211–911)

## 2012-08-26 ENCOUNTER — Telehealth: Payer: Self-pay | Admitting: *Deleted

## 2012-08-26 MED ORDER — NITROFURANTOIN MONOHYD MACRO 100 MG PO CAPS: 100.0000 mg | ORAL_CAPSULE | Freq: Two times a day (BID) | ORAL | Status: DC

## 2012-08-26 NOTE — Telephone Encounter (Signed)
Message copied by Elnora Morrison on Wed Aug 26, 2012 11:47 AM ------      Message from: Anselm Jungling      Created: Wed Aug 26, 2012  8:06 AM                   ----- Message -----         From: Jacques Navy, MD         Sent: 08/26/2012   3:50 AM           To: Anselm Jungling, CMA            Call patient today Lulu Riding) U/A with only 3-6 WBCs but with many bacteria. Any symptoms? Will treat with Macrobid twice a day for 7 days - please call in Rx.

## 2012-08-26 NOTE — Telephone Encounter (Signed)
Left message on patient cell # voice mail of urine lab results. Patient is having frequency and pain. Medication sent to pharmacy. Patient aware of this to start medication.

## 2012-10-06 ENCOUNTER — Other Ambulatory Visit: Payer: Self-pay | Admitting: *Deleted

## 2012-10-06 MED ORDER — ALPRAZOLAM 0.5 MG PO TABS: 0.5000 mg | ORAL_TABLET | Freq: Every evening | ORAL | Status: DC | PRN

## 2012-10-27 ENCOUNTER — Other Ambulatory Visit: Payer: Self-pay | Admitting: Internal Medicine

## 2012-10-28 ENCOUNTER — Other Ambulatory Visit: Payer: Self-pay | Admitting: *Deleted

## 2012-10-28 MED ORDER — SERTRALINE HCL 100 MG PO TABS: 100.0000 mg | ORAL_TABLET | Freq: Every day | ORAL | Status: DC

## 2012-11-05 ENCOUNTER — Ambulatory Visit (INDEPENDENT_AMBULATORY_CARE_PROVIDER_SITE_OTHER): Payer: BC Managed Care – PPO | Admitting: Internal Medicine

## 2012-11-05 ENCOUNTER — Encounter: Payer: Self-pay | Admitting: Internal Medicine

## 2012-11-05 VITALS — BP 130/88 | HR 92 | Temp 98.4°F | Resp 10 | Wt 154.1 lb

## 2012-11-05 DIAGNOSIS — I10 Essential (primary) hypertension: Secondary | ICD-10-CM

## 2012-11-05 DIAGNOSIS — K219 Gastro-esophageal reflux disease without esophagitis: Secondary | ICD-10-CM

## 2012-11-05 MED ORDER — PROMETHAZINE-CODEINE 6.25-10 MG/5ML PO SYRP: 5.0000 mL | ORAL_SOLUTION | ORAL | Status: DC | PRN

## 2012-11-05 MED ORDER — RABEPRAZOLE SODIUM 20 MG PO TBEC: 20.0000 mg | DELAYED_RELEASE_TABLET | Freq: Two times a day (BID) | ORAL | Status: DC

## 2012-11-05 MED ORDER — SUCRALFATE 1 GM/10ML PO SUSP: 1.0000 g | Freq: Four times a day (QID) | ORAL | Status: DC

## 2012-11-05 NOTE — Progress Notes (Signed)
  Subjective:    Patient ID: Shelby Vazquez, female    DOB: 12-30-1950, 62 y.o.   MRN: [id number]  HPI Patient with long history of severe reflux with associate cough, hoarseness and vocal chord damage as established by EGD, Bronchoscopy and ENT consultation.  Over the past weeks she started with a URI and progressed to very severe reflux, coarse deep raking cough. She has had neck strain. No fever, no sputum production. She only has shortness of breath paroxysms of coughing   Review of Systems     Objective:   Physical Exam Filed Vitals:   11/05/12 1500  BP: 130/88  Pulse: 92  Temp: 98.4 F (36.9 C)  Resp: 10   Gen'l - WNWD white woman in no distress HEENT- C&S clear Cor- RRR Pulm - no increased work of breathing between coughing spells. Lungs CTAP Abd- soft, BS+.       Assessment & Plan:

## 2012-11-05 NOTE — Patient Instructions (Addendum)
Severe reflux as the cause of cough, throat pain and strain.  Plan  acifex 20 mg twice a day  Carafate suspension  10 cc before each meal and bedtime  Consider EGD to rule out Barrett's esophagus.  Aspiration - this can be a dangerous problem and uncomfortable. If it is not frequent then just being aware and careful is all that is needed. If it is common   A swallow study should be done to look for underlying motor weakness

## 2012-11-06 ENCOUNTER — Encounter: Payer: Self-pay | Admitting: Internal Medicine

## 2012-11-07 NOTE — Assessment & Plan Note (Addendum)
Recurrent and severe cough probably related to severe reflux previously established.  She has been on PPI therapy. Last EGD more than 5 years ago and she does report mild dysphagia.  Plan Increase PPI to maximum dose - Aciphex 20 mg BID  Sucralfate Suspension 1 g AC/HS  GI referral for EGD - she would like to see Dr. Matthias Hughs - her husband sees him currently.

## 2012-11-07 NOTE — Assessment & Plan Note (Signed)
BP Readings from Last 3 Encounters:  11/05/12 130/88  01/20/12 110/78  11/05/11 120/82   Reasonable control on present regimen

## 2012-11-13 ENCOUNTER — Other Ambulatory Visit: Payer: Self-pay | Admitting: Internal Medicine

## 2012-11-22 ENCOUNTER — Other Ambulatory Visit: Payer: Self-pay | Admitting: Internal Medicine

## 2012-11-25 ENCOUNTER — Other Ambulatory Visit: Payer: Self-pay | Admitting: Internal Medicine

## 2012-11-26 NOTE — Telephone Encounter (Signed)
Please call Shelby Vazquez to check on how she is doing - better control of abdominal pain???

## 2012-12-05 ENCOUNTER — Other Ambulatory Visit: Payer: Self-pay

## 2012-12-07 ENCOUNTER — Encounter: Payer: Self-pay | Admitting: Internal Medicine

## 2012-12-08 ENCOUNTER — Other Ambulatory Visit: Payer: Self-pay | Admitting: Internal Medicine

## 2012-12-08 DIAGNOSIS — R05 Cough: Secondary | ICD-10-CM

## 2012-12-08 DIAGNOSIS — R059 Cough, unspecified: Secondary | ICD-10-CM

## 2012-12-10 ENCOUNTER — Other Ambulatory Visit: Payer: Self-pay | Admitting: Internal Medicine

## 2012-12-17 ENCOUNTER — Institutional Professional Consult (permissible substitution): Payer: BC Managed Care – PPO | Admitting: Critical Care Medicine

## 2012-12-31 ENCOUNTER — Ambulatory Visit (INDEPENDENT_AMBULATORY_CARE_PROVIDER_SITE_OTHER): Payer: BC Managed Care – PPO | Admitting: Critical Care Medicine

## 2012-12-31 ENCOUNTER — Encounter: Payer: Self-pay | Admitting: Internal Medicine

## 2012-12-31 ENCOUNTER — Encounter: Payer: Self-pay | Admitting: Critical Care Medicine

## 2012-12-31 VITALS — BP 140/92 | HR 98 | Temp 98.5°F | Ht 64.5 in | Wt 156.5 lb

## 2012-12-31 DIAGNOSIS — R05 Cough: Secondary | ICD-10-CM | POA: Insufficient documentation

## 2012-12-31 DIAGNOSIS — R053 Chronic cough: Secondary | ICD-10-CM | POA: Insufficient documentation

## 2012-12-31 DIAGNOSIS — R059 Cough, unspecified: Secondary | ICD-10-CM

## 2012-12-31 MED ORDER — FAMOTIDINE 40 MG PO TABS: 40.0000 mg | ORAL_TABLET | Freq: Every day | ORAL | Status: DC

## 2012-12-31 MED ORDER — FAMOTIDINE 40 MG PO TABS: 20.0000 mg | ORAL_TABLET | Freq: Every day | ORAL | Status: DC

## 2012-12-31 MED ORDER — HYDROCODONE-HOMATROPINE 5-1.5 MG/5ML PO SYRP
ORAL_SOLUTION | ORAL | Status: DC
Start: 2012-12-31 — End: 2013-11-29

## 2012-12-31 MED ORDER — PREDNISONE 10 MG PO TABS: ORAL_TABLET | ORAL | Status: DC

## 2012-12-31 MED ORDER — OMEPRAZOLE-SODIUM BICARBONATE 40-1100 MG PO CAPS: 1.0000 | ORAL_CAPSULE | Freq: Every day | ORAL | Status: DC

## 2012-12-31 MED ORDER — BENZONATATE 100 MG PO CAPS: ORAL_CAPSULE | ORAL | Status: DC

## 2012-12-31 NOTE — Patient Instructions (Addendum)
Take pepcid 40mg  every night Take hycodan  And tessalon per cough protocol Take zegerid before dinner and aciphex in daytime STRICT reflux diet Prednisone 10mg  Take 4 for two days three for two days two for two days one for two days Return 1 month

## 2012-12-31 NOTE — Progress Notes (Signed)
Subjective:    Patient ID: Shelby Vazquez, female    DOB: [birth year], 62 y.o.   MRN: [id number]  HPI Comments: Chronic cough for 44yrs.  Pt saw [facility] MD>>treated for cough.  I have seen the pt previously Pt went to [facility]>>dx GERD per Carlos American.  Cough never went away. 09/2012 GERD flare.  Rx neb med/ GERD Rx per NP [name] saw pt:  EGD: polyps.  No esophagitis. Gastric polyps.    Cough This is a chronic problem. The current episode started more than 1 year ago. The problem has been gradually improving. The cough is non-productive (dry barky, hoarse, also occ yellow green mucus just past few days). Associated symptoms include headaches, heartburn and shortness of breath. Pertinent negatives include no chest pain, chills, ear congestion, ear pain, fever, hemoptysis, myalgias, nasal congestion, postnasal drip, rash, rhinorrhea, sore throat, sweats, weight loss or wheezing. Associated symptoms comments: Chest tightness, dysphagia. The symptoms are aggravated by cold air, lying down and stress (talking exac). She has tried a beta-agonist inhaler and steroid inhaler for the symptoms. The treatment provided no relief. There is no history of asthma, bronchiectasis, bronchitis, COPD, emphysema, environmental allergies or pneumonia.    Past Medical History  Diagnosis Date  . Chronic anxiety   . B12 deficiency   . IBS (irritable bowel syndrome)   . Osteopenia   . Hypertension   . Uterine prolapse   . Hyperlipidemia   . Vocal cord paralysis, bilateral partial 2001    evaluated at [facility] - did not require surgery  . 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 Bronchitis, acute 10/22/12  . Acid reflux   . Cough      Family History  Problem Relation Age of Onset  . Cancer Father   . Cancer Maternal Aunt     breast  . Heart disease Mother   . Hyperlipidemia Mother   . Heart disease Maternal Grandmother   . Heart disease Maternal Grandfather    . Heart disease Paternal Grandmother   . Heart disease Paternal Grandfather   . Diabetes Neg Hx      History   Social History  . Marital Status: Married    Spouse Name: N/A    Number of Children: 2  . Years of Education: 16   Occupational History  . teacher    Social History Main Topics  . Smoking status: Never Smoker   . Smokeless tobacco: Never Used  . Alcohol Use: Yes     Comment: twice a year  . Drug Use: No  . Sexually Active: Yes -- Female partner(s)   Other Topics Concern  . Not on file   Social History Narrative   [employer] [place]. Married 1973. 2 daughters '74, '80; 5 grandchildren. 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 No h/o physical or sexual abuse. Marriage in good health     Allergies  Allergen Reactions  . Erythromycin      Outpatient Prescriptions Prior to Visit  Medication Sig Dispense Refill  . ALPRAZolam (XANAX) 0.5 MG tablet Take 1 tablet (0.5 mg total) by mouth at bedtime as needed for sleep.  30 tablet  5  . cyanocobalamin (,VITAMIN B-12,) 1000 MCG/ML injection inject 1 milliliter INTO THE MUCLE ONCE A MONTH  10 mL  1  . polyethylene glycol powder (GLYCOLAX/MIRALAX) powder Take 17 g by mouth as needed.       Marland Kitchen PRESCRIPTION MEDICATION  Apply topically 2 (two) times daily. Estradiol Progesterone cream 0.05-30 mg/ml      . Probiotic Product (ALIGN PO) Take by mouth daily.       . promethazine (PHENERGAN) 25 MG suppository Place 25 mg rectally every 6 (six) hours as needed.      . promethazine-codeine (PHENERGAN WITH CODEINE) 6.25-10 MG/5ML syrup Take 5 mLs by mouth every 4 (four) hours as needed for cough.  180 mL  2  . sertraline (ZOLOFT) 100 MG tablet Take 1 tablet (100 mg total) by mouth daily.  30 tablet  11  . simvastatin (ZOCOR) 40 MG tablet take 1 tablet by mouth at bedtime  30 tablet  5  . triamterene-hydrochlorothiazide (DYAZIDE) 37.5-25 MG per capsule take 1 capsule by mouth every morning  30 capsule  5  . CARAFATE  1 GM/10ML suspension take 10 milliliters by mouth four times a day  1200 mL  5  . clidinium-chlordiazePOXIDE (LIBRAX) 2.5-5 MG per capsule take 1 capsule by mouth twice a day if needed  180 capsule  2  . RABEprazole (ACIPHEX) 20 MG tablet Take 1 tablet (20 mg total) by mouth 2 (two) times daily.  180 tablet  3  . albuterol (PROVENTIL HFA;VENTOLIN HFA) 108 (90 BASE) MCG/ACT inhaler Inhale 2 puffs into the lungs 2 (two) times daily.      . budesonide-formoterol (SYMBICORT) 160-4.5 MCG/ACT inhaler Inhale 2 puffs into the lungs 2 (two) times daily.      . Calcium Carb-Cholecalciferol (CALCIUM-VITAMIN D) 500-400 MG-UNIT TABS Take 1,000 mg by mouth daily.      Marland Kitchen dexlansoprazole (DEXILANT) 60 MG capsule Take 60 mg by mouth 2 (two) times daily.      . metoCLOPramide (REGLAN) 5 MG tablet       . nitrofurantoin, macrocrystal-monohydrate, (MACROBID) 100 MG capsule Take 1 capsule (100 mg total) by mouth 2 (two) times daily. X 7 days.  14 capsule  0  . PROAIR HFA 108 (90 BASE) MCG/ACT inhaler        No facility-administered medications prior to visit.      Review of Systems  Constitutional: Positive for appetite change, fatigue and unexpected weight change. Negative for fever, chills, weight loss, diaphoresis and activity change.  HENT: Positive for congestion, trouble swallowing and voice change. Negative for hearing loss, ear pain, nosebleeds, sore throat, facial swelling, rhinorrhea, sneezing, drooling, mouth sores, neck pain, neck stiffness, dental problem, postnasal drip, sinus pressure, tinnitus and ear discharge.   Eyes: Negative for photophobia, discharge, itching and visual disturbance.  Respiratory: Positive for cough, choking, chest tightness and shortness of breath. Negative for apnea, hemoptysis, wheezing and stridor.   Cardiovascular: Negative for chest pain, palpitations and leg swelling.  Gastrointestinal: Positive for heartburn and vomiting. Negative for nausea, abdominal pain,  constipation, blood in stool and abdominal distention.  Genitourinary: Positive for flank pain. Negative for dysuria, urgency, frequency, hematuria, decreased urine volume and difficulty urinating.  Musculoskeletal: Positive for back pain and gait problem. Negative for myalgias, joint swelling and arthralgias.  Skin: Negative for color change, pallor and rash.  Allergic/Immunologic: Negative for environmental allergies.  Neurological: Positive for headaches. Negative for dizziness, tremors, seizures, syncope, speech difficulty, weakness, light-headedness and numbness.  Hematological: Negative for adenopathy. Does not bruise/bleed easily.  Psychiatric/Behavioral: Positive for sleep disturbance. Negative for confusion and agitation. The patient is not nervous/anxious.        Objective:   Physical Exam Filed Vitals:   12/31/12 1559  BP: 140/92  Pulse: 98  Temp:  98.5 F (36.9 C)  TempSrc: Oral  Height: 5' 4.5" (1.638 m)  Weight: 156 lb 8 oz (70.988 kg)  SpO2: 95%    Gen: Pleasant, well-nourished, in no distress,  normal affect  ENT: No lesions,  mouth clear,  oropharynx clear, no postnasal drip  Neck: No JVD, no TMG, no carotid bruits  Lungs: No use of accessory muscles, no dullness to percussion, Prominent pseudo-wheeze  Cardiovascular: RRR, heart sounds normal, no murmur or gallops, no peripheral edema  Abdomen: soft and NT, no HSM,  BS normal  Musculoskeletal: No deformities, no cyanosis or clubbing  Neuro: alert, non focal  Skin: Warm, no lesions or rashes  No results found.        Assessment & Plan:   Cough Severe cyclic cough on a recurrent basis due to upper airway instability syndrome and severe reflux disease No evidence of primary lung disease with normal spirometry seen and normal chest x-ray No evidence of asthma Plan Take pepcid 40mg  every night Take hycodan  And tessalon per cough protocol Take zegerid before dinner and aciphex in daytime STRICT  reflux diet Prednisone 10mg  Take 4 for two days three for two days two for two days one for two days for the upper airway inflammatory component Return 1 month    Updated Medication List Outpatient Encounter Prescriptions as of 12/31/2012  Medication Sig Dispense Refill  . ALPRAZolam (XANAX) 0.5 MG tablet Take 1 tablet (0.5 mg total) by mouth at bedtime as needed for sleep.  30 tablet  5  . clidinium-chlordiazePOXIDE (LIBRAX) 2.5-5 MG per capsule Take 1 capsule by mouth 2 (two) times daily.       . cyanocobalamin (,VITAMIN B-12,) 1000 MCG/ML injection inject 1 milliliter INTO THE MUCLE ONCE A MONTH  10 mL  1  . omeprazole-sodium bicarbonate (ZEGERID) 40-1100 MG per capsule Take 1 capsule by mouth daily before supper.      . polyethylene glycol powder (GLYCOLAX/MIRALAX) powder Take 17 g by mouth as needed.       Marland Kitchen PRESCRIPTION MEDICATION Apply topically 2 (two) times daily. Estradiol Progesterone cream 0.05-30 mg/ml      . Probiotic Product (ALIGN PO) Take by mouth daily.       . promethazine (PHENERGAN) 25 MG suppository Place 25 mg rectally every 6 (six) hours as needed.      . promethazine-codeine (PHENERGAN WITH CODEINE) 6.25-10 MG/5ML syrup Take 5 mLs by mouth every 4 (four) hours as needed for cough.  180 mL  2  . RABEprazole (ACIPHEX) 20 MG tablet Take 20 mg by mouth daily.      . sertraline (ZOLOFT) 100 MG tablet Take 1 tablet (100 mg total) by mouth daily.  30 tablet  11  . simvastatin (ZOCOR) 40 MG tablet take 1 tablet by mouth at bedtime  30 tablet  5  . sucralfate (CARAFATE) 1 GM/10ML suspension       . triamterene-hydrochlorothiazide (DYAZIDE) 37.5-25 MG per capsule take 1 capsule by mouth every morning  30 capsule  5  . [DISCONTINUED] CARAFATE 1 GM/10ML suspension take 10 milliliters by mouth four times a day  1200 mL  5  . [DISCONTINUED] clidinium-chlordiazePOXIDE (LIBRAX) 2.5-5 MG per capsule take 1 capsule by mouth twice a day if needed  180 capsule  2  . [DISCONTINUED]  omeprazole-sodium bicarbonate (ZEGERID) 40-1100 MG per capsule Take 1 capsule by mouth at bedtime.      . [DISCONTINUED] RABEprazole (ACIPHEX) 20 MG tablet Take 1 tablet (20 mg total) by  mouth 2 (two) times daily.  180 tablet  3  . benzonatate (TESSALON) 100 MG capsule Take 1-2 every 4 hours per cough protocol  60 capsule  6  . famotidine (PEPCID) 40 MG tablet Take 1 tablet (40 mg total) by mouth at bedtime.  30 tablet  6  . HYDROcodone-homatropine (HYCODAN) 5-1.5 MG/5ML syrup Take 5 ML 4 times daily per cough protocol  240 mL  2  . predniSONE (DELTASONE) 10 MG tablet Take 4 for two days three for two days two for two days one for two days  20 tablet  0  . [DISCONTINUED] albuterol (PROVENTIL HFA;VENTOLIN HFA) 108 (90 BASE) MCG/ACT inhaler Inhale 2 puffs into the lungs 2 (two) times daily.      . [DISCONTINUED] budesonide-formoterol (SYMBICORT) 160-4.5 MCG/ACT inhaler Inhale 2 puffs into the lungs 2 (two) times daily.      . [DISCONTINUED] Calcium Carb-Cholecalciferol (CALCIUM-VITAMIN D) 500-400 MG-UNIT TABS Take 1,000 mg by mouth daily.      . [DISCONTINUED] dexlansoprazole (DEXILANT) 60 MG capsule Take 60 mg by mouth 2 (two) times daily.      . [DISCONTINUED] famotidine (PEPCID) 40 MG tablet Take 0.5 tablets (20 mg total) by mouth at bedtime.  30 tablet  6  . [DISCONTINUED] metoCLOPramide (REGLAN) 5 MG tablet       . [DISCONTINUED] nitrofurantoin, macrocrystal-monohydrate, (MACROBID) 100 MG capsule Take 1 capsule (100 mg total) by mouth 2 (two) times daily. X 7 days.  14 capsule  0  . [DISCONTINUED] Omeprazole-Sodium Bicarbonate (ZEGERID PO) Take 1 capsule by mouth at bedtime.      . [DISCONTINUED] PROAIR HFA 108 (90 BASE) MCG/ACT inhaler        No facility-administered encounter medications on file as of 12/31/2012.

## 2013-01-01 NOTE — Assessment & Plan Note (Signed)
Severe cyclic cough on a recurrent basis due to upper airway instability syndrome and severe reflux disease No evidence of primary lung disease with normal spirometry seen and normal chest x-ray No evidence of asthma Plan Take pepcid 40mg  every night Take hycodan  And tessalon per cough protocol Take zegerid before dinner and aciphex in daytime STRICT reflux diet Prednisone 10mg  Take 4 for two days three for two days two for two days one for two days for the upper airway inflammatory component Return 1 month

## 2013-01-19 HISTORY — PX: ESOPHAGOGASTRODUODENOSCOPY ENDOSCOPY: SHX5814

## 2013-01-20 ENCOUNTER — Encounter: Payer: Self-pay | Admitting: Internal Medicine

## 2013-01-20 ENCOUNTER — Other Ambulatory Visit (INDEPENDENT_AMBULATORY_CARE_PROVIDER_SITE_OTHER): Payer: BC Managed Care – PPO

## 2013-01-20 ENCOUNTER — Ambulatory Visit (INDEPENDENT_AMBULATORY_CARE_PROVIDER_SITE_OTHER): Payer: BC Managed Care – PPO | Admitting: Internal Medicine

## 2013-01-20 VITALS — BP 124/80 | HR 92 | Temp 96.2°F | Resp 12 | Ht 64.5 in | Wt 152.4 lb

## 2013-01-20 DIAGNOSIS — K117 Disturbances of salivary secretion: Secondary | ICD-10-CM

## 2013-01-20 DIAGNOSIS — E785 Hyperlipidemia, unspecified: Secondary | ICD-10-CM

## 2013-01-20 DIAGNOSIS — K219 Gastro-esophageal reflux disease without esophagitis: Secondary | ICD-10-CM

## 2013-01-20 DIAGNOSIS — N814 Uterovaginal prolapse, unspecified: Secondary | ICD-10-CM

## 2013-01-20 DIAGNOSIS — R059 Cough, unspecified: Secondary | ICD-10-CM

## 2013-01-20 DIAGNOSIS — E538 Deficiency of other specified B group vitamins: Secondary | ICD-10-CM

## 2013-01-20 DIAGNOSIS — I10 Essential (primary) hypertension: Secondary | ICD-10-CM

## 2013-01-20 DIAGNOSIS — Z0000 Encounter for general adult medical examination without abnormal findings: Secondary | ICD-10-CM

## 2013-01-20 DIAGNOSIS — F411 Generalized anxiety disorder: Secondary | ICD-10-CM

## 2013-01-20 DIAGNOSIS — R05 Cough: Secondary | ICD-10-CM

## 2013-01-20 DIAGNOSIS — Z23 Encounter for immunization: Secondary | ICD-10-CM

## 2013-01-20 LAB — HEPATIC FUNCTION PANEL
ALT: 20 U/L (ref 0–35)
AST: 24 U/L (ref 0–37)
Bilirubin, Direct: 0.1 mg/dL (ref 0.0–0.3)
Total Bilirubin: 0.6 mg/dL (ref 0.3–1.2)
Total Protein: 7 g/dL (ref 6.0–8.3)

## 2013-01-20 LAB — COMPREHENSIVE METABOLIC PANEL
ALT: 20 U/L (ref 0–35)
AST: 24 U/L (ref 0–37)
Albumin: 4.3 g/dL (ref 3.5–5.2)
Alkaline Phosphatase: 46 U/L (ref 39–117)
Calcium: 9.2 mg/dL (ref 8.4–10.5)
Chloride: 95 mEq/L — ABNORMAL LOW (ref 96–112)
Creatinine, Ser: 1 mg/dL (ref 0.4–1.2)
Potassium: 3.6 mEq/L (ref 3.5–5.1)

## 2013-01-20 LAB — LIPID PANEL
LDL Cholesterol: 84 mg/dL (ref 0–99)
Total CHOL/HDL Ratio: 4
VLDL: 26.4 mg/dL (ref 0.0–40.0)

## 2013-01-20 MED ORDER — NABUMETONE 500 MG PO TABS: 500.0000 mg | ORAL_TABLET | Freq: Two times a day (BID) | ORAL | Status: DC | PRN

## 2013-01-20 NOTE — Progress Notes (Signed)
Subjective:    Patient ID: Shelby Vazquez, female    DOB: [birth year], 62 y.o.   MRN: [id number]  HPI Ms. Jernberg presents for a general exam. She has had a real problem with cough: she has seen Dr. Matthias Hughs for GI and EGD was unremarkable except for gastric polyps. She has seen Dr. Delford Field - diagnosis of cyclical cough and she is following a strict regimen. She remains very hoarse with progessive loss of phonation during the day. She does c/o xerostomia. Past evaluation at [facility], almost 10 years ago, did reveal vocal cord nodule and parallel dysfunction.   She is in a better place mentally: her brother is settled, she has made a decision to retire - a primary source of stress. Family is good.  She is current with Gyn, is for DEXA. Been to dentist. Been to eye doctor. Current with colorectal cancer screening - had a bad experience last time.   Past Medical History  Diagnosis Date  . Chronic anxiety   . B12 deficiency   . IBS (irritable bowel syndrome)   . Osteopenia   . Hypertension   . Uterine prolapse   . Hyperlipidemia   . Vocal cord paralysis, bilateral partial 2001    evaluated at [facility] - did not require surgery  . 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 Bronchitis, acute 10/22/12  . Acid reflux   . Cough    Past Surgical History  Procedure Laterality Date  . Cataract extraction      right 01/2010, left may 2011  . Bunionectomy      [place] '2000  . Dilation and curettage of uterus  '77    for molar pregnancy excision   Family History  Problem Relation Age of Onset  . Cancer Father   . Cancer Maternal Aunt     breast  . Heart disease Mother   . Hyperlipidemia Mother   . Heart disease Maternal Grandmother   . Heart disease Maternal Grandfather   . Heart disease Paternal Grandmother   . Heart disease Paternal Grandfather   . Diabetes Neg Hx    History   Social History  . Marital Status: Married   Spouse Name: N/A    Number of Children: 2  . Years of Education: 16   Occupational History  . teacher    Social History Main Topics  . Smoking status: Never Smoker   . Smokeless tobacco: Never Used  . Alcohol Use: Yes     Comment: twice a year  . Drug Use: No  . Sexually Active: Yes -- Female partner(s)   Other Topics Concern  . Not on file   Social History Narrative   [employer]. Married 1973. 2 daughters '74, '80; 5 grandchildren. 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 No h/o physical or sexual abuse. Marriage in good health    Current Outpatient Prescriptions on File Prior to Visit  Medication Sig Dispense Refill  . ALPRAZolam (XANAX) 0.5 MG tablet Take 1 tablet (0.5 mg total) by mouth at bedtime as needed for sleep.  30 tablet  5  . benzonatate (TESSALON) 100 MG capsule Take 1-2 every 4 hours per cough protocol  60 capsule  6  . clidinium-chlordiazePOXIDE (LIBRAX) 2.5-5 MG per capsule Take 1 capsule by mouth 2 (two) times daily.       . cyanocobalamin (,VITAMIN B-12,) 1000 MCG/ML injection inject 1 milliliter INTO THE  MUCLE ONCE A MONTH  10 mL  1  . HYDROcodone-homatropine (HYCODAN) 5-1.5 MG/5ML syrup Take 5 ML 4 times daily per cough protocol  240 mL  2  . omeprazole-sodium bicarbonate (ZEGERID) 40-1100 MG per capsule Take 1 capsule by mouth daily before supper.      . polyethylene glycol powder (GLYCOLAX/MIRALAX) powder Take 17 g by mouth as needed.       Marland Kitchen PRESCRIPTION MEDICATION Apply topically 2 (two) times daily. Estradiol Progesterone cream 0.05-30 mg/ml      . Probiotic Product (ALIGN PO) Take by mouth daily.       . promethazine (PHENERGAN) 25 MG suppository Place 25 mg rectally every 6 (six) hours as needed.      . promethazine-codeine (PHENERGAN WITH CODEINE) 6.25-10 MG/5ML syrup Take 5 mLs by mouth every 4 (four) hours as needed for cough.  180 mL  2  . RABEprazole (ACIPHEX) 20 MG tablet Take 20 mg by mouth daily.      . sertraline  (ZOLOFT) 100 MG tablet Take 1 tablet (100 mg total) by mouth daily.  30 tablet  11  . simvastatin (ZOCOR) 40 MG tablet take 1 tablet by mouth at bedtime  30 tablet  5  . sucralfate (CARAFATE) 1 GM/10ML suspension       . triamterene-hydrochlorothiazide (DYAZIDE) 37.5-25 MG per capsule take 1 capsule by mouth every morning  30 capsule  5   No current facility-administered medications on file prior to visit.      Review of Systems Constitutional:  Negative for fever, chills, activity change and unexpected weight change.  HEENT:  Negative for hearing loss, ear pain, congestion, neck stiffness and postnasal drip. Negative for sore throat or swallowing problems. Negative for dental complaints.   Eyes: Negative for vision loss or change in visual acuity.  Respiratory: Negative for chest tightness and wheezing. Negative for DOE.   Cardiovascular: Negative for chest pain or palpitations. No decreased exercise tolerance Gastrointestinal: No change in bowel habit. No bloating or gas. No reflux or indigestion Genitourinary: Negative for urgency, frequency, flank pain and difficulty urinating.  Musculoskeletal: Negative for myalgias, back pain, arthralgias and gait problem.  Neurological: Negative for dizziness, tremors, weakness and headaches.  Hematological: Negative for adenopathy.  Psychiatric/Behavioral: Negative for behavioral problems and dysphoric mood.       Objective:   Physical Exam Filed Vitals:   01/20/13 0858  BP: 124/80  Pulse: 92  Temp: 96.2 F (35.7 C)  Resp: 12   Wt Readings from Last 3 Encounters:  01/20/13 152 lb 6.4 oz (69.128 kg)  12/31/12 156 lb 8 oz (70.988 kg)  11/05/12 154 lb 1.3 oz (69.89 kg)   Gen'l: well nourished, well developed white Woman in no distress HEENT - Plain/AT, EACs/TMs normal, oropharynx with native dentition in good condition, no buccal or palatal lesions, posterior pharynx clear, mucous membranes dry. C&S clear, PERRLA, fundi - normal Neck -  supple, no thyromegaly Nodes- negative submental, cervical, supraclavicular regions Chest - no deformity, no CVAT Lungs - cleat without rales, wheezes. No increased work of breathing Breast - deferred to gyn Cardiovascular - regular rate and rhythm, quiet precordium, no murmurs, rubs or gallops, 2+ radial, DP and PT pulses Abdomen - BS+ x 4, no HSM, no guarding or rebound or tenderness Pelvic - deferred to gyn Rectal - deferred to gyn Extremities - no clubbing, cyanosis, edema or deformity.  Neuro - A&O x 3, CN II-XII normal, motor strength normal and equal, DTRs 2+ and symmetrical  biceps, radial, and patellar tendons. Cerebellar - no tremor, no rigidity, fluid movement and normal gait. Derm - Head, neck, back, abdomen and extremities without suspicious lesions  Lab Results  Component Value Date   WBC 8.3 06/10/2011   HGB 11.9* 06/10/2011   HCT 36.8 06/10/2011   PLT 300.0 06/10/2011   GLUCOSE 111* 01/20/2013   CHOL 148 01/20/2013   TRIG 132.0 01/20/2013   HDL 37.80* 01/20/2013   LDLCALC 84 01/20/2013        ALT 20 01/20/2013   AST 24 01/20/2013        NA 133* 01/20/2013   K 3.6 01/20/2013   CL 95* 01/20/2013   CREATININE 1.0 01/20/2013   BUN 11 01/20/2013   CO2 27 01/20/2013   TSH 1.90 01/20/2013           Assessment & Plan:  Xerostomia - a bothersome problem. She is constantly drinking water, using hard candies.  Plan R/o Sjogren's: SSA/SSB panel ordered

## 2013-01-20 NOTE — Patient Instructions (Addendum)
Thanks for coming in to see me. Your exam is generally speaking normal. Think about ENT eval if the hoarseness doesn't improve. Docs to who I refer: Dr. Lazarus Salines, Dr. Jearld Fenton, Dr. Suszanne Conners Will be tracking down the xerostomia (dry mouth) with additional labs - any further recommendations to be based on results  Full report to follow.

## 2013-01-21 LAB — ANA: Anti Nuclear Antibody(ANA): NEGATIVE

## 2013-01-21 NOTE — Assessment & Plan Note (Signed)
B12 level 1280 - supra-normal.  Plan- normal dietary sources of B12, no need for supplement at this time

## 2013-01-21 NOTE — Assessment & Plan Note (Signed)
Improved level of anxiety related to medication AND decision to change her situation, i.e. Retire. She does report that when away from her usual routine for 4 days while on vacation she was a different person - a key indicator.  Plan Continue present medications for now

## 2013-01-21 NOTE — Assessment & Plan Note (Signed)
Lab reveals excellent control with LDL well below goal of 100 or less; HDL slightly lower than goal of 40+. LDL/HDL ratio very protective. Low cardiac risk.  Plan Continue present medication

## 2013-01-21 NOTE — Assessment & Plan Note (Addendum)
She reports that her GERD is fairly well controlled. This does contribute to her cough.   Plan Continue present medications  Follow up with Dr. Matthias Hughs as instructed.

## 2013-01-21 NOTE — Assessment & Plan Note (Signed)
Follows with Dr. Vincente Poli. She does use a pessary

## 2013-01-21 NOTE — Progress Notes (Signed)
  Subjective:    Patient ID: Shelby Vazquez, female    DOB: May 04, 1951, 62 y.o.   MRN: [id number]  HPI    Review of Systems     Objective:   Physical Exam   Lab Results  Component Value Date   WBC 8.3 06/10/2011   HGB 11.9* 06/10/2011   HCT 36.8 06/10/2011   PLT 300.0 06/10/2011   GLUCOSE 111* 01/20/2013   CHOL 148 01/20/2013   TRIG 132.0 01/20/2013   HDL 37.80* 01/20/2013   LDLCALC 84 01/20/2013   ALT 20 01/20/2013   ALT 20 01/20/2013   AST 24 01/20/2013   AST 24 01/20/2013   NA 133* 01/20/2013   K 3.6 01/20/2013   CL 95* 01/20/2013   CREATININE 1.0 01/20/2013   BUN 11 01/20/2013   CO2 27 01/20/2013   TSH 1.90 01/20/2013       B12                         1080      Assessment & Plan:

## 2013-01-21 NOTE — Assessment & Plan Note (Signed)
For several months she has had a persistent harsh cough. She is following a stricture regimen of acid suppression, avoidance of triggers, no Caffeine or chocolat. There has been some improvement. She does continue to have hoarseness/laryngitis related to the cough.  Plan Per Dr. Delford Field: she will continue current regimen  For unresolved hoarseness she may need repeat ENT evaluation of larynx/vocal cords.

## 2013-01-21 NOTE — Assessment & Plan Note (Signed)
BP Readings from Last 3 Encounters:  01/20/13 124/80  12/31/12 140/92  11/05/12 130/88   Good control on present medication. Labs are normal  Plan Continue present regimen

## 2013-01-21 NOTE — Assessment & Plan Note (Signed)
Interval history is most notable for persistent severe cough and anxiety. She is otherwise doing well. Limited physical exam, sans breast and pelvic, is normal. Lab results reviewed - in normal range. She is current for colorectal and breast cancer screening. Immunizations - pneumonia vaccine today, she is to check on coverage for shingles vaccine.  In summary - a very nice woman with two major issues both of which are improving. She is encouraged in regard to plans to retire which may help resolve her anxiety. She will return as needed.

## 2013-01-23 ENCOUNTER — Encounter: Payer: Self-pay | Admitting: Internal Medicine

## 2013-02-04 ENCOUNTER — Ambulatory Visit (INDEPENDENT_AMBULATORY_CARE_PROVIDER_SITE_OTHER): Payer: BC Managed Care – PPO | Admitting: Critical Care Medicine

## 2013-02-04 ENCOUNTER — Encounter: Payer: Self-pay | Admitting: Critical Care Medicine

## 2013-02-04 VITALS — BP 126/88 | HR 94 | Temp 98.2°F | Ht 64.5 in | Wt 149.0 lb

## 2013-02-04 DIAGNOSIS — R059 Cough, unspecified: Secondary | ICD-10-CM

## 2013-02-04 DIAGNOSIS — R05 Cough: Secondary | ICD-10-CM

## 2013-02-04 NOTE — Patient Instructions (Addendum)
Continue cough protocol Follow STRICT reflux diet Caution with vocal overuse Return 3 months

## 2013-02-04 NOTE — Progress Notes (Signed)
Subjective:    Patient ID: Shelby Vazquez, female    DOB: [birth year], 62 y.o.   MRN: [id number]  HPI Comments: Chronic cough for 55yrs.  Pt saw [facility]>>treated for cough.  I have seen the pt previously Pt went to [facility]>>dx GERD per Carlos American.  Cough never went away. 09/2012 GERD flare.  Rx neb med/ GERD Rx per NP [name] saw pt:  EGD: polyps.  No esophagitis. Gastric polyps.    Cough This is a chronic problem. The current episode started more than 1 year ago. The problem has been gradually improving. The cough is non-productive (dry barky, hoarse, also occ yellow green mucus just past few days). Associated symptoms include headaches, heartburn and shortness of breath. Pertinent negatives include no chest pain, chills, ear congestion, ear pain, fever, hemoptysis, myalgias, nasal congestion, postnasal drip, rash, rhinorrhea, sore throat, sweats, weight loss or wheezing. Associated symptoms comments: Chest tightness, dysphagia. The symptoms are aggravated by cold air, lying down and stress (talking exac). She has tried a beta-agonist inhaler and steroid inhaler for the symptoms. The treatment provided no relief. There is no history of asthma, bronchiectasis, bronchitis, COPD, emphysema, environmental allergies or pneumonia.   02/04/2013 Cough is better At last ov  Severe cyclic cough on a recurrent basis due to upper airway instability syndrome and severe reflux disease No evidence of primary lung disease with normal spirometry seen and normal chest x-ray No evidence of asthma Plan Take pepcid 40mg  every night Take hycodan  And tessalon per cough protocol Take zegerid before dinner and aciphex in daytime STRICT reflux diet Prednisone 10mg  Take 4 for two days three for two days two for two days one for two days for the upper airway inflammatory component Now on diet.  Pt saw wolicki: recent ENT:  Upper airway raw. Acid damage noted.      Past Medical History  Diagnosis Date  .  Chronic anxiety   . B12 deficiency   . IBS (irritable bowel syndrome)   . Osteopenia   . Hypertension   . Uterine prolapse   . Hyperlipidemia   . Vocal cord paralysis, bilateral partial 2001    evaluated at [facility] - did not require surgery  . 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 Bronchitis, acute 10/22/12  . Acid reflux   . Cough      Family History  Problem Relation Age of Onset  . Cancer Father   . Cancer Maternal Aunt     breast  . Heart disease Mother   . Hyperlipidemia Mother   . Heart disease Maternal Grandmother   . Heart disease Maternal Grandfather   . Heart disease Paternal Grandmother   . Heart disease Paternal Grandfather   . Diabetes Neg Hx      History   Social History  . Marital Status: Married    Spouse Name: N/A    Number of Children: 2  . Years of Education: 16   Occupational History  . teacher    Social History Main Topics  . Smoking status: Never Smoker   . Smokeless tobacco: Never Used  . Alcohol Use: Yes     Comment: twice a year  . Drug Use: No  . Sexually Active: Yes -- Female partner(s)   Other Topics Concern  . Not on file   Social History Narrative   [employer]. Married 1973. 2 daughters '74, '80; 5 grandchildren. 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 No h/o physical or sexual abuse. Marriage in good health     Allergies  Allergen Reactions  . Erythromycin      Outpatient Prescriptions Prior to Visit  Medication Sig Dispense Refill  . ALPRAZolam (XANAX) 0.5 MG tablet Take 1 tablet (0.5 mg total) by mouth at bedtime as needed for sleep.  30 tablet  5  . benzonatate (TESSALON) 100 MG capsule Take 1-2 every 4 hours per cough protocol  60 capsule  6  . clidinium-chlordiazePOXIDE (LIBRAX) 2.5-5 MG per capsule Take 1 capsule by mouth 2 (two) times daily.       . cyanocobalamin (,VITAMIN B-12,) 1000 MCG/ML injection inject 1 milliliter INTO THE  MUCLE ONCE A MONTH  10 mL  1  . famotidine (PEPCID) 40 MG tablet Take by mouth at bedtime. Take 2 tablets at bedtime      . HYDROcodone-homatropine (HYCODAN) 5-1.5 MG/5ML syrup Take 5 ML 4 times daily per cough protocol  240 mL  2  . omeprazole-sodium bicarbonate (ZEGERID) 40-1100 MG per capsule Take 1 capsule by mouth daily before supper.      . polyethylene glycol powder (GLYCOLAX/MIRALAX) powder Take 17 g by mouth as needed.       Marland Kitchen PRESCRIPTION MEDICATION Apply topically 2 (two) times daily. Estradiol Progesterone cream 0.05-30 mg/ml      . Probiotic Product (ALIGN PO) Take by mouth daily.       . promethazine (PHENERGAN) 25 MG suppository Place 25 mg rectally every 6 (six) hours as needed.      . RABEprazole (ACIPHEX) 20 MG tablet Take 20 mg by mouth daily.      . sertraline (ZOLOFT) 100 MG tablet Take 1 tablet (100 mg total) by mouth daily.  30 tablet  11  . simvastatin (ZOCOR) 40 MG tablet take 1 tablet by mouth at bedtime  30 tablet  5  . sucralfate (CARAFATE) 1 GM/10ML suspension as needed.       . triamterene-hydrochlorothiazide (DYAZIDE) 37.5-25 MG per capsule take 1 capsule by mouth every morning  30 capsule  5  . nabumetone (RELAFEN) 500 MG tablet Take 1 tablet (500 mg total) by mouth 2 (two) times daily as needed for pain.      Marland Kitchen HYDROCODONE BITARTRATE ER PO Take 1 mL by mouth at bedtime.      . Probiotic Product ([employer] DIGESTIVE PROBIOTIC PO) Take by mouth.      . promethazine-codeine (PHENERGAN WITH CODEINE) 6.25-10 MG/5ML syrup Take 5 mLs by mouth every 4 (four) hours as needed for cough.  180 mL  2   No facility-administered medications prior to visit.      Review of Systems  Constitutional: Positive for appetite change, fatigue and unexpected weight change. Negative for fever, chills, weight loss, diaphoresis and activity change.  HENT: Positive for congestion, trouble swallowing and voice change. Negative for hearing loss, ear pain, nosebleeds, sore throat, facial swelling,  rhinorrhea, sneezing, drooling, mouth sores, neck pain, neck stiffness, dental problem, postnasal drip, sinus pressure, tinnitus and ear discharge.   Eyes: Negative for photophobia, discharge, itching and visual disturbance.  Respiratory: Positive for cough, choking, chest tightness and shortness of breath. Negative for apnea, hemoptysis, wheezing and stridor.   Cardiovascular: Negative for chest pain, palpitations and leg swelling.  Gastrointestinal: Positive for heartburn and vomiting. Negative for nausea, abdominal pain, constipation, blood in stool and abdominal distention.  Genitourinary: Positive for flank pain. Negative for dysuria, urgency, frequency, hematuria, decreased urine volume  and difficulty urinating.  Musculoskeletal: Positive for back pain and gait problem. Negative for myalgias, joint swelling and arthralgias.  Skin: Negative for color change, pallor and rash.  Allergic/Immunologic: Negative for environmental allergies.  Neurological: Positive for headaches. Negative for dizziness, tremors, seizures, syncope, speech difficulty, weakness, light-headedness and numbness.  Hematological: Negative for adenopathy. Does not bruise/bleed easily.  Psychiatric/Behavioral: Positive for sleep disturbance. Negative for confusion and agitation. The patient is not nervous/anxious.        Objective:   Physical Exam  Filed Vitals:   02/04/13 1608  BP: 126/88  Pulse: 94  Temp: 98.2 F (36.8 C)  TempSrc: Oral  Height: 5' 4.5" (1.638 m)  Weight: 149 lb (67.586 kg)  SpO2: 93%    Gen: Pleasant, well-nourished, in no distress,  normal affect  ENT: No lesions,  mouth clear,  oropharynx clear, no postnasal drip  Neck: No JVD, no TMG, no carotid bruits  Lungs: No use of accessory muscles, no dullness to percussion, less Prominent pseudo-wheeze  Cardiovascular: RRR, heart sounds normal, no murmur or gallops, no peripheral edema  Abdomen: soft and NT, no HSM,  BS  normal  Musculoskeletal: No deformities, no cyanosis or clubbing  Neuro: alert, non focal  Skin: Warm, no lesions or rashes  No results found.        Assessment & Plan:   Cough Severe  Cyclical cough , no true asthma or primary lung disease, vocal overuse as teacher ppt factors: Severe GERD, postnasal drip syndrome Plan Continue cough protocol Follow STRICT reflux diet Caution with vocal overuse Return 3 months     Updated Medication List Outpatient Encounter Prescriptions as of 02/04/2013  Medication Sig Dispense Refill  . ALPRAZolam (XANAX) 0.5 MG tablet Take 1 tablet (0.5 mg total) by mouth at bedtime as needed for sleep.  30 tablet  5  . benzonatate (TESSALON) 100 MG capsule Take 1-2 every 4 hours per cough protocol  60 capsule  6  . clidinium-chlordiazePOXIDE (LIBRAX) 2.5-5 MG per capsule Take 1 capsule by mouth 2 (two) times daily.       . cyanocobalamin (,VITAMIN B-12,) 1000 MCG/ML injection inject 1 milliliter INTO THE MUCLE ONCE A MONTH  10 mL  1  . famotidine (PEPCID) 40 MG tablet Take by mouth at bedtime. Take 2 tablets at bedtime      . HYDROcodone-homatropine (HYCODAN) 5-1.5 MG/5ML syrup Take 5 ML 4 times daily per cough protocol  240 mL  2  . ibuprofen (ADVIL,MOTRIN) 200 MG tablet Take by mouth. 3 tablets in the am and 4 tablets at bedtime      . omeprazole-sodium bicarbonate (ZEGERID) 40-1100 MG per capsule Take 1 capsule by mouth daily before supper.      . polyethylene glycol powder (GLYCOLAX/MIRALAX) powder Take 17 g by mouth as needed.       Marland Kitchen PRESCRIPTION MEDICATION Apply topically 2 (two) times daily. Estradiol Progesterone cream 0.05-30 mg/ml      . Probiotic Product (ALIGN PO) Take by mouth daily.       . promethazine (PHENERGAN) 25 MG suppository Place 25 mg rectally every 6 (six) hours as needed.      . RABEprazole (ACIPHEX) 20 MG tablet Take 20 mg by mouth daily.      . sertraline (ZOLOFT) 100 MG tablet Take 1 tablet (100 mg total) by mouth daily.   30 tablet  11  . simvastatin (ZOCOR) 40 MG tablet take 1 tablet by mouth at bedtime  30 tablet  5  .  sucralfate (CARAFATE) 1 GM/10ML suspension as needed.       . triamterene-hydrochlorothiazide (DYAZIDE) 37.5-25 MG per capsule take 1 capsule by mouth every morning  30 capsule  5  . nabumetone (RELAFEN) 500 MG tablet Take 1 tablet (500 mg total) by mouth 2 (two) times daily as needed for pain.      . [DISCONTINUED] HYDROCODONE BITARTRATE ER PO Take 1 mL by mouth at bedtime.      . [DISCONTINUED] Probiotic Product ([employer]S DIGESTIVE PROBIOTIC PO) Take by mouth.      . [DISCONTINUED] promethazine-codeine (PHENERGAN WITH CODEINE) 6.25-10 MG/5ML syrup Take 5 mLs by mouth every 4 (four) hours as needed for cough.  180 mL  2   No facility-administered encounter medications on file as of 02/04/2013.

## 2013-02-05 ENCOUNTER — Telehealth: Payer: Self-pay | Admitting: Internal Medicine

## 2013-02-05 NOTE — Telephone Encounter (Signed)
rec'd from [facility] Th[facility]at forward 3 pages to Dr.Norins 02/05/13

## 2013-02-05 NOTE — Assessment & Plan Note (Signed)
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

## 2013-02-13 ENCOUNTER — Other Ambulatory Visit: Payer: Self-pay | Admitting: Internal Medicine

## 2013-02-15 NOTE — Telephone Encounter (Signed)
Ok to Rf in PCP absence? 

## 2013-02-22 ENCOUNTER — Telehealth: Payer: Self-pay

## 2013-02-22 NOTE — Telephone Encounter (Signed)
Prior authorization form for Rabeprazole 20 mg filled out and waiting on MD signature.

## 2013-02-23 NOTE — Telephone Encounter (Signed)
Prior authorization form faxed to [phone]

## 2013-02-24 ENCOUNTER — Encounter: Payer: Self-pay | Admitting: Internal Medicine

## 2013-03-01 ENCOUNTER — Other Ambulatory Visit: Payer: Self-pay | Admitting: Internal Medicine

## 2013-03-02 ENCOUNTER — Other Ambulatory Visit: Payer: Self-pay

## 2013-03-02 MED ORDER — RABEPRAZOLE SODIUM 20 MG PO TBEC: 20.0000 mg | DELAYED_RELEASE_TABLET | Freq: Every day | ORAL | Status: DC

## 2013-03-02 NOTE — Telephone Encounter (Signed)
Received a form from [facility] stating pt's prior authorization form for Rabeprazole Sodium 20 mg was faxed back blank. I re-faxed the form that was filled out and faxed on 02/23/13. Waiting approval.

## 2013-03-03 NOTE — Telephone Encounter (Signed)
Received a fax back from [facility] that Rabeprazole sodium 20 mg has been approved effective 02/10/13 to 03/03/14. This information was faxed to [facility] [phone]

## 2013-03-25 ENCOUNTER — Ambulatory Visit: Payer: BC Managed Care – PPO

## 2013-04-01 ENCOUNTER — Other Ambulatory Visit: Payer: Self-pay | Admitting: *Deleted

## 2013-04-01 MED ORDER — BENZONATATE 100 MG PO CAPS: ORAL_CAPSULE | ORAL | Status: DC

## 2013-04-20 ENCOUNTER — Other Ambulatory Visit: Payer: Self-pay

## 2013-04-20 MED ORDER — ALPRAZOLAM 0.5 MG PO TABS: 0.5000 mg | ORAL_TABLET | Freq: Every evening | ORAL | Status: DC | PRN

## 2013-04-20 NOTE — Telephone Encounter (Signed)
Alprazolam called to pharmacy  

## 2013-04-28 ENCOUNTER — Telehealth: Payer: Self-pay | Admitting: *Deleted

## 2013-04-28 ENCOUNTER — Telehealth: Payer: Self-pay | Admitting: Internal Medicine

## 2013-04-28 NOTE — Telephone Encounter (Signed)
Pt request shingle shot through email back in 03/25/13. Pt send massage back today stating that pt already have shingle shot already at [facility]. Please document if not already been done in epic.

## 2013-04-28 NOTE — Telephone Encounter (Signed)
Injection added to immunization list

## 2013-04-28 NOTE — Telephone Encounter (Signed)
Pt called requesting a refill on Aciphex.  Spoke with pharmacy because there is still a refill on 02/2013 Rx.  Pharmacist states PA is required.  She is faxing PA request to [place].

## 2013-04-30 ENCOUNTER — Other Ambulatory Visit: Payer: Self-pay

## 2013-04-30 MED ORDER — TRIAMTERENE-HCTZ 37.5-25 MG PO CAPS: 1.0000 | ORAL_CAPSULE | Freq: Every day | ORAL | Status: DC

## 2013-04-30 MED ORDER — CILIDINIUM-CHLORDIAZEPOXIDE 2.5-5 MG PO CAPS: 1.0000 | ORAL_CAPSULE | Freq: Two times a day (BID) | ORAL | Status: DC

## 2013-04-30 MED ORDER — RABEPRAZOLE SODIUM 20 MG PO TBEC: 20.0000 mg | DELAYED_RELEASE_TABLET | Freq: Every day | ORAL | Status: DC

## 2013-04-30 MED ORDER — SERTRALINE HCL 100 MG PO TABS: 100.0000 mg | ORAL_TABLET | Freq: Every day | ORAL | Status: DC

## 2013-04-30 MED ORDER — SIMVASTATIN 40 MG PO TABS: 40.0000 mg | ORAL_TABLET | Freq: Every day | ORAL | Status: DC

## 2013-04-30 NOTE — Telephone Encounter (Signed)
Patient requested scripts for Aciphex, Simvastatin, triamterene-hydrochlorothiazide, sertraline, and Librax be printed so she can pick them up Monday morning. Scripts have been printed and waiting for Dr Debby Bud signature.

## 2013-05-03 ENCOUNTER — Telehealth: Payer: Self-pay

## 2013-05-03 NOTE — Telephone Encounter (Signed)
1. Has she tried otc Prilosec taking 2 (40 mg) every day 2. Can she tell us what is covered by her insurance? Dexilant is a good product, she had zegrid before.

## 2013-05-03 NOTE — Telephone Encounter (Signed)
I spoke to patient and she states she doesn't know what her insurance covers. She has tried Prilosec before and that did not help. Neither did Dexilant, that caused her to have diarrhea and cramping. She says she is on a GERD diet. She has zegrid before dinner and pepcid at night. She would have the aciphex in the morning (but that is not covered now) please advise. Thanks

## 2013-05-03 NOTE — Telephone Encounter (Signed)
Reviewed chart: many cooks stirring the pot: Internal med, GI, Pulm, ENT looking for cause of cough. Per Dr. Warrick Parisian EGD was negative and he recommended Pulm workup.   Regard to medications: Zegrid (omeprazole + baking soda) should be taken in the AM before breakfast. Taking aciphex is redundant. She may and should take Pepcid twice a day: between breakfast and lunch and after supper.

## 2013-05-03 NOTE — Telephone Encounter (Signed)
Patient states she spoke to [employer] who spoke to [place]. [employer] will not approve Rabeprazole 20 mg for her until 2015. She's taken her last one and is going out of town 7/17 for 11 days and would like to know what she can do? please advise. Thanks

## 2013-05-04 NOTE — Telephone Encounter (Signed)
Sent a mychart msg will call her as well

## 2013-05-04 NOTE — Telephone Encounter (Signed)
Left message for patient to return call on mobile voicemail

## 2013-05-05 ENCOUNTER — Other Ambulatory Visit: Payer: Self-pay | Admitting: Critical Care Medicine

## 2013-05-13 ENCOUNTER — Ambulatory Visit: Payer: BC Managed Care – PPO | Admitting: Critical Care Medicine

## 2013-05-19 ENCOUNTER — Encounter: Payer: Self-pay | Admitting: Critical Care Medicine

## 2013-05-19 ENCOUNTER — Ambulatory Visit (INDEPENDENT_AMBULATORY_CARE_PROVIDER_SITE_OTHER): Payer: BC Managed Care – PPO | Admitting: Critical Care Medicine

## 2013-05-19 VITALS — BP 124/66 | HR 97 | Temp 98.1°F | Ht 64.5 in | Wt 153.0 lb

## 2013-05-19 DIAGNOSIS — R05 Cough: Secondary | ICD-10-CM

## 2013-05-19 DIAGNOSIS — R059 Cough, unspecified: Secondary | ICD-10-CM

## 2013-05-19 MED ORDER — BENZONATATE 100 MG PO CAPS: ORAL_CAPSULE | ORAL | Status: DC

## 2013-05-19 NOTE — Patient Instructions (Addendum)
Watch for voice overuse Remember sugar free candy Watch diet Try to get Dr Matthias Hughs to help with aciphex samples No other medication changes Return 6 months , sooner if needed

## 2013-05-19 NOTE — Progress Notes (Signed)
Subjective:    Patient ID: Shelby Vazquez, female    DOB: January 25, 1951, 62 y.o.   MRN: [id number]  HPI  Hx of chronic cough that was treated with the intense diet and three day rest. Has noted significant relief from her cough since that time. Major changes of the diet were coffee and chocolate. Still has the cough but it is nonproductive. Still has some hoarseness. Uses Zegered for heartburn.   Review of Systems Constitutional:   No  weight loss, night sweats,  Fevers, chills, fatigue, lassitude. HEENT:   No headaches,  Difficulty swallowing,  Tooth/dental problems,  Sore throat,                No sneezing, itching, ear ache, nasal congestion, post nasal drip,   CV:  No chest pain,  Orthopnea, PND, swelling in lower extremities, anasarca, dizziness, palpitations  GI  Notes  heartburn, notes indigestion, NO abdominal pain, nausea, vomiting, diarrhea, change in bowel habits, loss of appetite  Resp: No shortness of breath with exertion or at rest.  No excess mucus, no productive cough,  Notes  non-productive cough,  No coughing up of blood.  No change in color of mucus.  No wheezing.  No chest wall deformity  Skin: no rash or lesions.  GU: no dysuria, change in color of urine, no urgency or frequency.  No flank pain.  MS:  No joint pain or swelling.  No decreased range of motion.  No back pain.  Psych:  No change in mood or affect. No depression or anxiety.  No memory loss.     Objective:   Physical Exam Filed Vitals:   05/19/13 1525  BP: 124/66  Pulse: 97  Temp: 98.1 F (36.7 C)  TempSrc: Oral  Height: 5' 4.5" (1.638 m)  Weight: 153 lb (69.4 kg)  SpO2: 95%    Gen: Pleasant, well-nourished, in no distress,  normal affect  ENT: No lesions,  mouth clear,  oropharynx clear, no postnasal drip, overt hoarseness  Neck: No JVD, no TMG, no carotid bruits  Lungs: No use of accessory muscles, no dullness to percussion, less prominent pseudo-wheeze  Cardiovascular: RRR, heart  sounds normal, no murmur or gallops, no peripheral edema  Abdomen: soft and NT, no HSM,  BS normal  Musculoskeletal: No deformities, no cyanosis or clubbing  Neuro: alert, non focal  Skin: Warm, no lesions or rashes  No results found.        Assessment & Plan:   Cough, cyclical on the basis of reflux disease and upper airway instability Cyclical cough on the basis of reflux disease and upper airway instability Improved with reflux treatment and cough control Plan Continue as needed benzonatate Continue vocal hygiene Continue strict reflux diet Continue Zegerid daily and supplement with AcipHex as the patient is able to obtain samples as insurance will not cover AcipHex Continue Pepcid as prescribed    Updated Medication List Outpatient Encounter Prescriptions as of 05/19/2013  Medication Sig Dispense Refill  . ALPRAZolam (XANAX) 0.5 MG tablet Take 1 tablet (0.5 mg total) by mouth at bedtime as needed for sleep.  30 tablet  5  . benzonatate (TESSALON) 100 MG capsule take 1-2 capsules every 4 hours PER COUGH PROTOCOL  60 capsule  3  . clidinium-chlordiazePOXIDE (LIBRAX) 2.5-5 MG per capsule Take 1 capsule by mouth 2 (two) times daily.  180 capsule  3  . cyanocobalamin (,VITAMIN B-12,) 1000 MCG/ML injection inject 1 milliliter INTO THE MUCLE ONCE A MONTH  10  mL  1  . famotidine (PEPCID) 40 MG tablet Take 80 mg by mouth at bedtime.       Marland Kitchen HYDROcodone-homatropine (HYCODAN) 5-1.5 MG/5ML syrup Take 5 ML 4 times daily per cough protocol  240 mL  2  . ibuprofen (ADVIL,MOTRIN) 200 MG tablet Take by mouth. 3 tablets in the am and 4 tablets at bedtime      . nabumetone (RELAFEN) 500 MG tablet Take 1 tablet (500 mg total) by mouth 2 (two) times daily as needed for pain.      Marland Kitchen omeprazole-sodium bicarbonate (ZEGERID) 40-1100 MG per capsule Take 1 capsule by mouth 2 (two) times daily.      . polyethylene glycol powder (GLYCOLAX/MIRALAX) powder Take 17 g by mouth as needed.       Marland Kitchen  PRESCRIPTION MEDICATION Apply topically 2 (two) times daily. Estradiol Progesterone cream 0.05-30 mg/ml      . Probiotic Product (ALIGN PO) Take by mouth daily.       . promethazine (PHENERGAN) 25 MG suppository Place 25 mg rectally every 6 (six) hours as needed.      . sertraline (ZOLOFT) 100 MG tablet Take 1 tablet (100 mg total) by mouth daily.  90 tablet  3  . simvastatin (ZOCOR) 40 MG tablet Take 1 tablet (40 mg total) by mouth at bedtime.  90 tablet  3  . sucralfate (CARAFATE) 1 GM/10ML suspension as needed.       . triamterene-hydrochlorothiazide (DYAZIDE) 37.5-25 MG per capsule Take 1 each (1 capsule total) by mouth daily.  90 capsule  3  . [DISCONTINUED] benzonatate (TESSALON) 100 MG capsule take 1-2 capsules every 4 hours PER COUGH PROTOCOL  60 capsule  0  . [DISCONTINUED] omeprazole-sodium bicarbonate (ZEGERID) 40-1100 MG per capsule Take 1 capsule by mouth daily before supper.      . RABEprazole (ACIPHEX) 20 MG tablet Take 1 tablet (20 mg total) by mouth daily.  90 tablet  3   No facility-administered encounter medications on file as of 05/19/2013.

## 2013-05-20 ENCOUNTER — Ambulatory Visit: Payer: BC Managed Care – PPO | Admitting: Critical Care Medicine

## 2013-05-20 NOTE — Assessment & Plan Note (Signed)
Cyclical cough on the basis of reflux disease and upper airway instability Improved with reflux treatment and cough control Plan Continue as needed benzonatate Continue vocal hygiene Continue strict reflux diet Continue Zegerid daily and supplement with AcipHex as the patient is able to obtain samples as insurance will not cover AcipHex Continue Pepcid as prescribed

## 2013-05-21 LAB — HM MAMMOGRAPHY: HM Mammogram: NORMAL

## 2013-05-27 ENCOUNTER — Encounter: Payer: Self-pay | Admitting: Internal Medicine

## 2013-05-28 ENCOUNTER — Other Ambulatory Visit: Payer: Self-pay | Admitting: Internal Medicine

## 2013-05-28 MED ORDER — CIPROFLOXACIN HCL 250 MG PO TABS: 250.0000 mg | ORAL_TABLET | Freq: Two times a day (BID) | ORAL | Status: DC

## 2013-07-20 ENCOUNTER — Encounter: Payer: Self-pay | Admitting: Internal Medicine

## 2013-08-02 ENCOUNTER — Other Ambulatory Visit: Payer: Self-pay | Admitting: *Deleted

## 2013-08-02 MED ORDER — FAMOTIDINE 40 MG PO TABS: 80.0000 mg | ORAL_TABLET | Freq: Every day | ORAL | Status: DC

## 2013-10-18 ENCOUNTER — Other Ambulatory Visit: Payer: Self-pay | Admitting: Critical Care Medicine

## 2013-11-17 ENCOUNTER — Other Ambulatory Visit: Payer: Self-pay

## 2013-11-17 MED ORDER — CYANOCOBALAMIN 1000 MCG/ML IJ SOLN: 1000.0000 ug | INTRAMUSCULAR | Status: DC

## 2013-11-29 ENCOUNTER — Telehealth: Payer: Self-pay | Admitting: Critical Care Medicine

## 2013-11-29 MED ORDER — HYDROCODONE-HOMATROPINE 5-1.5 MG/5ML PO SYRP: ORAL_SOLUTION | ORAL | Status: DC

## 2013-11-29 NOTE — Telephone Encounter (Signed)
Ok to refill x 1 but needs an OV to regroup

## 2013-11-29 NOTE — Telephone Encounter (Signed)
Last OV 05/19/13 No pending OV at this time Last fill of Hydromet was 12/31/12  PW - please advise on refill. Thanks.

## 2013-11-29 NOTE — Telephone Encounter (Signed)
Rx was signed by PW.  I have placed at front for pick up.  Pt aware and is aware to keep appt with PW on 2/16.  She verbalized understanding and voiced no further questions or concerns at this time.

## 2013-11-29 NOTE — Telephone Encounter (Signed)
Rx has been printed. ROV has been made for 12/06/13 at 9:15am.

## 2013-12-06 ENCOUNTER — Ambulatory Visit (INDEPENDENT_AMBULATORY_CARE_PROVIDER_SITE_OTHER): Payer: BC Managed Care – PPO | Admitting: Critical Care Medicine

## 2013-12-06 ENCOUNTER — Encounter: Payer: Self-pay | Admitting: Critical Care Medicine

## 2013-12-06 VITALS — BP 134/90 | HR 85 | Temp 98.0°F | Ht 64.5 in | Wt 152.8 lb

## 2013-12-06 DIAGNOSIS — R05 Cough: Secondary | ICD-10-CM

## 2013-12-06 DIAGNOSIS — R059 Cough, unspecified: Secondary | ICD-10-CM

## 2013-12-06 MED ORDER — AMOXICILLIN-POT CLAVULANATE 875-125 MG PO TABS: 1.0000 | ORAL_TABLET | Freq: Two times a day (BID) | ORAL | Status: DC

## 2013-12-06 MED ORDER — METHYLPREDNISOLONE ACETATE 80 MG/ML IJ SUSP
80.0000 mg | Freq: Once | INTRAMUSCULAR | Status: AC
  Administered 2013-12-06: 80 mg via INTRAMUSCULAR

## 2013-12-06 MED ORDER — FLUTICASONE PROPIONATE 50 MCG/ACT NA SUSP: 2.0000 | Freq: Every day | NASAL | Status: DC

## 2013-12-06 NOTE — Assessment & Plan Note (Signed)
Cyclical cough d/t severe GERD and now postnasal drip with mild sinusitis Plan A depomedrol injection 80mg  was given Start Augmentin one twice daily for 7days Use flonase /fluticasone nasal two puff ea nostril daily for one Rx bottle then stop Follow cough protocol with hycodan/benzonatate Stay on aciphex/pepcid Return 1 month

## 2013-12-06 NOTE — Patient Instructions (Signed)
A depomedrol injection 80mg  was given Start Augmentin one twice daily for 7days Use flonase /fluticasone nasal two puff ea nostril daily for one Rx bottle then stop Follow cough protocol Stay on aciphex/pepcid Return 1 month

## 2013-12-06 NOTE — Progress Notes (Signed)
Subjective:    Patient ID: Shelby Vazquez, female    DOB: Mar 12, 1951, 63 y.o.   MRN: [id number]  HPI   12/06/2013 Chief Complaint  Patient presents with  . Acute Visit    prod cough with yellowish green mucus, increased SOB, hoarseness, and chest tightness x 1 1/2 wks.   Not seen since 04/8587 for cyclical coug Pt well until one week ago.  Ran out of med for GERD 1/30. D/t insurance issues. Took a while aciphex to resume.  Cough started on 11/26/13 then worse. THen at night, voice went away.  Sore throat. Cough syrup issues.  Not able to sleep.  Mucus is yellow green, noted some sneezing and sinus running.  No facial pressure.  Notes some pressure in chest.  No f/c/s.  Notes some exhaustion.  Now retired from school system. Heartburn was better  Review of Systems  Constitutional:   No  weight loss, night sweats,  Fevers, chills, fatigue, lassitude. HEENT:   No headaches,  Difficulty swallowing,  Tooth/dental problems,  Sore throat,                No sneezing, itching, ear ache, nasal congestion, post nasal drip,   CV:  No chest pain,  Orthopnea, PND, swelling in lower extremities, anasarca, dizziness, palpitations  GI  Notes  heartburn, notes indigestion, NO abdominal pain, nausea, vomiting, diarrhea, change in bowel habits, loss of appetite  Resp: No shortness of breath with exertion or at rest.  No excess mucus, no productive cough,  Notes  non-productive cough,  No coughing up of blood.  No change in color of mucus.  No wheezing.  No chest wall deformity  Skin: no rash or lesions.  GU: no dysuria, change in color of urine, no urgency or frequency.  No flank pain.  MS:  No joint pain or swelling.  No decreased range of motion.  No back pain.  Psych:  No change in mood or affect. No depression or anxiety.  No memory loss.     Objective:   Physical Exam  Filed Vitals:   12/06/13 0926  BP: 134/90  Pulse: 85  Temp: 98 F (36.7 C)  TempSrc: Oral  Height: 5' 4.5" (1.638 m)   Weight: 152 lb 12.8 oz (69.31 kg)  SpO2: 99%    Gen: Pleasant, well-nourished, in no distress,  normal affect  ENT: No lesions,  mouth clear,  oropharynx clear, ++ postnasal drip, notes some nasal purulence. Note  overt hoarseness  Neck: No JVD, no TMG, no carotid bruits  Lungs: No use of accessory muscles, no dullness to percussion, mild pseudo-wheeze  Cardiovascular: RRR, heart sounds normal, no murmur or gallops, no peripheral edema  Abdomen: soft and NT, no HSM,  BS normal  Musculoskeletal: No deformities, no cyanosis or clubbing  Neuro: alert, non focal  Skin: Warm, no lesions or rashes  No results found.        Assessment & Plan:   Cough, cyclical on the basis of reflux disease and upper airway instability Cyclical cough d/t severe GERD and now postnasal drip with mild sinusitis Plan A depomedrol injection 80mg  was given Start Augmentin one twice daily for 7days Use flonase /fluticasone nasal two puff ea nostril daily for one Rx bottle then stop Follow cough protocol with hycodan/benzonatate Stay on aciphex/pepcid Return 1 month     Updated Medication List Outpatient Encounter Prescriptions as of 12/06/2013  Medication Sig  . ALPRAZolam (XANAX) 0.5 MG tablet Take 1 tablet (0.5  mg total) by mouth at bedtime as needed for sleep.  . benzonatate (TESSALON) 100 MG capsule take 1-2 capsules by mouth every 4 hours if needed for cough PROTOCOL  . calcium gluconate 500 MG tablet Take 2 tablets by mouth daily.  . Cholecalciferol (VITAMIN D3) 2000 UNITS TABS Take 4,000 Units by mouth daily.  . clidinium-chlordiazePOXIDE (LIBRAX) 2.5-5 MG per capsule Take 1 capsule by mouth 2 (two) times daily.  . cyanocobalamin (,VITAMIN B-12,) 1000 MCG/ML injection Inject 1 mL (1,000 mcg total) into the muscle every 30 (thirty) days.  Marland Kitchen dextromethorphan (DELSYM) 30 MG/5ML liquid Take by mouth as needed for cough.  . famotidine (PEPCID) 40 MG tablet Take 2 tablets (80 mg total) by  mouth at bedtime.  Marland Kitchen HYDROcodone-homatropine (HYCODAN) 5-1.5 MG/5ML syrup Take 5 ML 4 times daily per cough protocol  . ibuprofen (ADVIL,MOTRIN) 200 MG tablet Take by mouth. 3 tablets in the am and 4 tablets at bedtime  . Iron-FA-B Cmp-C-Biot-Probiotic (FUSION PLUS PO) Take by mouth daily.  . Magnesium 250 MG TABS Take 1 tablet by mouth daily.  . nabumetone (RELAFEN) 500 MG tablet Take 1 tablet (500 mg total) by mouth 2 (two) times daily as needed for pain.  . polyethylene glycol powder (GLYCOLAX/MIRALAX) powder Take 17 g by mouth as needed.   Marland Kitchen PRESCRIPTION MEDICATION Apply topically 2 (two) times daily. Estradiol Progesterone cream 0.05-30 mg/ml  . RABEprazole (ACIPHEX) 20 MG tablet Take 1 tablet (20 mg total) by mouth daily.  . ranitidine (ZANTAC) 150 MG capsule Take 300 mg by mouth 3 (three) times daily as needed for heartburn.  . sertraline (ZOLOFT) 100 MG tablet Take 1 tablet (100 mg total) by mouth daily.  . simvastatin (ZOCOR) 40 MG tablet Take 1 tablet (40 mg total) by mouth at bedtime.  . sucralfate (CARAFATE) 1 GM/10ML suspension Take by mouth 4 (four) times daily as needed.   . triamterene-hydrochlorothiazide (DYAZIDE) 37.5-25 MG per capsule Take 1 each (1 capsule total) by mouth daily.  Marland Kitchen amoxicillin-clavulanate (AUGMENTIN) 875-125 MG per tablet Take 1 tablet by mouth 2 (two) times daily.  . fluticasone (FLONASE) 50 MCG/ACT nasal spray Place 2 sprays into both nostrils daily.  . [DISCONTINUED] ciprofloxacin (CIPRO) 250 MG tablet Take 1 tablet (250 mg total) by mouth 2 (two) times daily.  . [DISCONTINUED] omeprazole-sodium bicarbonate (ZEGERID) 40-1100 MG per capsule Take 1 capsule by mouth 2 (two) times daily.  . [DISCONTINUED] Probiotic Product (ALIGN PO) Take by mouth daily.   . [DISCONTINUED] promethazine (PHENERGAN) 25 MG suppository Place 25 mg rectally every 6 (six) hours as needed.  . [EXPIRED] methylPREDNISolone acetate (DEPO-MEDROL) injection 80 mg

## 2013-12-14 ENCOUNTER — Other Ambulatory Visit: Payer: Self-pay | Admitting: Internal Medicine

## 2013-12-24 ENCOUNTER — Telehealth: Payer: Self-pay | Admitting: Critical Care Medicine

## 2013-12-24 MED ORDER — HYDROCODONE-HOMATROPINE 5-1.5 MG/5ML PO SYRP: ORAL_SOLUTION | ORAL | Status: DC

## 2013-12-24 NOTE — Telephone Encounter (Signed)
Pt last saw PW on 12/06/13. He advised her to follow the cyclical cough protocol with Hycodan. The last rx we gave her was on 11/29/13 #293mL. Has pending OV with PW on 01/03/14.  BQ - please advise on refill in PW's absence. Thanks.

## 2013-12-24 NOTE — Telephone Encounter (Signed)
Per BQ - this is fine to fill.  Pt is aware. Nothing further is needed.

## 2014-01-03 ENCOUNTER — Encounter: Payer: Self-pay | Admitting: Critical Care Medicine

## 2014-01-03 ENCOUNTER — Ambulatory Visit (INDEPENDENT_AMBULATORY_CARE_PROVIDER_SITE_OTHER): Payer: BC Managed Care – PPO | Admitting: Critical Care Medicine

## 2014-01-03 VITALS — BP 116/86 | HR 86 | Temp 98.1°F | Ht 64.5 in | Wt 152.2 lb

## 2014-01-03 DIAGNOSIS — R05 Cough: Secondary | ICD-10-CM

## 2014-01-03 DIAGNOSIS — R059 Cough, unspecified: Secondary | ICD-10-CM

## 2014-01-03 MED ORDER — HYDROCODONE-HOMATROPINE 5-1.5 MG/5ML PO SYRP: ORAL_SOLUTION | ORAL | Status: DC

## 2014-01-03 NOTE — Assessment & Plan Note (Signed)
Cyclical cough likely on the basis of high level reflux despite negative upper endoscopy Plan Use sugar free lozenges as needed Stay on GI program as you are using Use cough syrup  rx as needed Return as needed

## 2014-01-03 NOTE — Progress Notes (Signed)
Subjective:    Patient ID: Shelby Vazquez, female    DOB: Jun 13, 1951, 63 y.o.   MRN: [id number]  HPI  01/03/2014 Chief Complaint  Patient presents with  . 1 month follow up    Cough has improved as of last wk.  Still coughing some over the past few night.  Cough is now nonprod.  No SOB, chest tightness/pain, or wheezing.  Cough is some better.  Noting more heartburn.  Sees buccini  Diet issues.  No true post nasal drip.  Augmentin caused diarrhea.     Review of Systems  Constitutional:   No  weight loss, night sweats,  Fevers, chills, fatigue, lassitude. HEENT:   No headaches,  Difficulty swallowing,  Tooth/dental problems,  Sore throat,                No sneezing, itching, ear ache, nasal congestion, post nasal drip,   CV:  No chest pain,  Orthopnea, PND, swelling in lower extremities, anasarca, dizziness, palpitations  GI  Notes  heartburn, notes indigestion, NO abdominal pain, nausea, vomiting, diarrhea, change in bowel habits, loss of appetite  Resp: No shortness of breath with exertion or at rest.  No excess mucus, no productive cough,  Notes  non-productive cough,  No coughing up of blood.  No change in color of mucus.  No wheezing.  No chest wall deformity  Skin: no rash or lesions.  GU: no dysuria, change in color of urine, no urgency or frequency.  No flank pain.  MS:  No joint pain or swelling.  No decreased range of motion.  No back pain.  Psych:  No change in mood or affect. No depression or anxiety.  No memory loss.     Objective:   Physical Exam  Filed Vitals:   01/03/14 1001  BP: 116/86  Pulse: 86  Temp: 98.1 F (36.7 C)  TempSrc: Oral  Height: 5' 4.5" (1.638 m)  Weight: 152 lb 3.2 oz (69.037 kg)  SpO2: 96%    Gen: Pleasant, well-nourished, in no distress,  normal affect  ENT: No lesions,  mouth clear,  oropharynx clear, ++ postnasal drip, notes some nasal purulence. Note  overt hoarseness  Neck: No JVD, no TMG, no carotid bruits  Lungs: No use  of accessory muscles, no dullness to percussion, mild pseudo-wheeze  Cardiovascular: RRR, heart sounds normal, no murmur or gallops, no peripheral edema  Abdomen: soft and NT, no HSM,  BS normal  Musculoskeletal: No deformities, no cyanosis or clubbing  Neuro: alert, non focal  Skin: Warm, no lesions or rashes  No results found.        Assessment & Plan:   Cough, cyclical on the basis of reflux disease and upper airway instability Cyclical cough likely on the basis of high level reflux despite negative upper endoscopy Plan Use sugar free lozenges as needed Stay on GI program as you are using Use cough syrup  rx as needed Return as needed     Updated Medication List Outpatient Encounter Prescriptions as of 01/03/2014  Medication Sig  . ALPRAZolam (XANAX) 0.5 MG tablet Take 1 tablet (0.5 mg total) by mouth at bedtime as needed for sleep.  . benzonatate (TESSALON) 100 MG capsule take 1-2 capsules by mouth every 4 hours if needed for cough PROTOCOL  . calcium gluconate 500 MG tablet Take 2 tablets by mouth daily.  Marland Kitchen CARAFATE 1 GM/10ML suspension TAKE 10 MLS BY MOUTH 4 TIMES DAILY  . Cholecalciferol (VITAMIN D3) 2000 UNITS TABS  Take 4,000 Units by mouth daily.  . clidinium-chlordiazePOXIDE (LIBRAX) 2.5-5 MG per capsule Take 1 capsule by mouth 2 (two) times daily.  . cyanocobalamin (,VITAMIN B-12,) 1000 MCG/ML injection Inject 1 mL (1,000 mcg total) into the muscle every 30 (thirty) days.  Marland Kitchen dextromethorphan (DELSYM) 30 MG/5ML liquid Take by mouth as needed for cough.  . famotidine (PEPCID) 40 MG tablet Take 2 tablets (80 mg total) by mouth at bedtime.  . fluticasone (FLONASE) 50 MCG/ACT nasal spray Place 2 sprays into both nostrils daily.  Marland Kitchen HYDROcodone-homatropine (HYCODAN) 5-1.5 MG/5ML syrup Take 5 ML 4 times daily per cough protocol  . ibuprofen (ADVIL,MOTRIN) 200 MG tablet Take by mouth. 3 tablets in the am and 3 tablets at bedtime  . Iron-FA-B Cmp-C-Biot-Probiotic (FUSION  PLUS PO) Take by mouth daily.  . Magnesium 250 MG TABS Take 1 tablet by mouth daily.  . nabumetone (RELAFEN) 500 MG tablet Take 1 tablet (500 mg total) by mouth 2 (two) times daily as needed for pain.  . polyethylene glycol powder (GLYCOLAX/MIRALAX) powder Take 17 g by mouth as needed.   Marland Kitchen PRESCRIPTION MEDICATION Apply topically 2 (two) times daily. Estradiol Progesterone cream 0.05-30 mg/ml  . RABEprazole (ACIPHEX) 20 MG tablet Take 1 tablet (20 mg total) by mouth daily.  . ranitidine (ZANTAC) 150 MG capsule Take 300 mg by mouth 3 (three) times daily as needed for heartburn.  . sertraline (ZOLOFT) 100 MG tablet Take 1 tablet (100 mg total) by mouth daily.  . simvastatin (ZOCOR) 40 MG tablet Take 1 tablet (40 mg total) by mouth at bedtime.  . triamterene-hydrochlorothiazide (DYAZIDE) 37.5-25 MG per capsule Take 1 each (1 capsule total) by mouth daily.  . [DISCONTINUED] HYDROcodone-homatropine (HYCODAN) 5-1.5 MG/5ML syrup Take 5 ML 4 times daily per cough protocol  . [DISCONTINUED] sucralfate (CARAFATE) 1 GM/10ML suspension Take by mouth 4 (four) times daily as needed.   . [DISCONTINUED] amoxicillin-clavulanate (AUGMENTIN) 875-125 MG per tablet Take 1 tablet by mouth 2 (two) times daily.

## 2014-01-03 NOTE — Patient Instructions (Signed)
Use sugar free lozenges as needed Stay on GI program as you are using Use cough syrup  rx as needed Return as needed

## 2014-01-28 ENCOUNTER — Encounter: Payer: Self-pay | Admitting: Family Medicine

## 2014-01-28 ENCOUNTER — Ambulatory Visit (INDEPENDENT_AMBULATORY_CARE_PROVIDER_SITE_OTHER): Payer: BC Managed Care – PPO | Admitting: Family Medicine

## 2014-01-28 VITALS — BP 122/76 | HR 82 | Temp 98.0°F | Ht 64.25 in | Wt 152.0 lb

## 2014-01-28 DIAGNOSIS — K219 Gastro-esophageal reflux disease without esophagitis: Secondary | ICD-10-CM

## 2014-01-28 DIAGNOSIS — M7062 Trochanteric bursitis, left hip: Secondary | ICD-10-CM | POA: Insufficient documentation

## 2014-01-28 DIAGNOSIS — M25552 Pain in left hip: Secondary | ICD-10-CM

## 2014-01-28 DIAGNOSIS — M25559 Pain in unspecified hip: Secondary | ICD-10-CM

## 2014-01-28 NOTE — Assessment & Plan Note (Addendum)
Anticipate L GTBursitis. Treat with stretching exercises provided today from [facility] pt advisor as well as ice to lateral hip. Continue ibuprofen as up to now with GERD precautions. If no better, rec f/u with SM to discuss steroid injection.

## 2014-01-28 NOTE — Progress Notes (Signed)
BP 122/76  Pulse 82  Temp(Src) 98 F (36.7 C) (Oral)  Ht 5' 4.25" (1.632 m)  Wt 152 lb (68.947 kg)  BMI 25.89 kg/m2  SpO2 99%   CC: transfer care  Subjective:    Patient ID: Shelby Vazquez, female    DOB: 1951-09-27, 63 y.o.   MRN: [id number]  HPI: ARIEON SCALZO is a 63 y.o. female presenting on 01/28/2014 for Establish Care and Hip Pain   Prior saw Dr. Linda Hedges - presents today to transfer care and discuss hip pain.  GI Dr. Wallis Mart - found to have iron deficiency - taking iron now. Severe GERD - has seen mult GI docs in past.  L hip pain - ongoing for last 3.5 weeks.  Started after walked around neighborhood for 30-45 min for first time this year.  Points to lateral left hip as site of pain, some aching pain down left lateral leg.  Over this weekend walking for 1.5 hours (low intensity).  Has continued walking regularly (zoo, [place], etc).  Taking ibuprofen 600mg  bid which helps somewhat.  Denies inciting trauma/injury.  No back pain, no numbness/tingling.  Pt is used to high intensity hiking and hasn't done anything excess recently. H/o fall 05/2013 while hiking and hit left lateral hip and since then unable to lay on left side.  Relevant past medical, surgical, family and social history reviewed and updated as indicated.  Allergies and medications reviewed and updated. Current Outpatient Prescriptions on File Prior to Visit  Medication Sig  . ALPRAZolam (XANAX) 0.5 MG tablet Take 1 tablet (0.5 mg total) by mouth at bedtime as needed for sleep.  . benzonatate (TESSALON) 100 MG capsule take 1-2 capsules by mouth every 4 hours if needed for cough PROTOCOL  . calcium gluconate 500 MG tablet Take 2 tablets by mouth daily.  Marland Kitchen CARAFATE 1 GM/10ML suspension TAKE 10 MLS BY MOUTH 4 TIMES DAILY  . Cholecalciferol (VITAMIN D3) 2000 UNITS TABS Take 4,000 Units by mouth daily.  . clidinium-chlordiazePOXIDE (LIBRAX) 2.5-5 MG per capsule Take 1 capsule by mouth 2 (two) times daily.  .  cyanocobalamin (,VITAMIN B-12,) 1000 MCG/ML injection Inject 1 mL (1,000 mcg total) into the muscle every 30 (thirty) days.  Marland Kitchen dextromethorphan (DELSYM) 30 MG/5ML liquid Take by mouth as needed for cough.  . famotidine (PEPCID) 40 MG tablet Take 2 tablets (80 mg total) by mouth at bedtime.  . fluticasone (FLONASE) 50 MCG/ACT nasal spray Place 2 sprays into both nostrils daily.  Marland Kitchen HYDROcodone-homatropine (HYCODAN) 5-1.5 MG/5ML syrup Take 5 ML 4 times daily per cough protocol  . ibuprofen (ADVIL,MOTRIN) 200 MG tablet Take by mouth. 3 tablets in the am and 3 tablets at bedtime  . Iron-FA-B Cmp-C-Biot-Probiotic (FUSION PLUS PO) Take by mouth daily.  . Magnesium 250 MG TABS Take 1 tablet by mouth daily.  . nabumetone (RELAFEN) 500 MG tablet Take 1 tablet (500 mg total) by mouth 2 (two) times daily as needed for pain.  . polyethylene glycol powder (GLYCOLAX/MIRALAX) powder Take 17 g by mouth as needed.   Marland Kitchen PRESCRIPTION MEDICATION Apply topically 2 (two) times daily. Estradiol Progesterone cream 0.05-30 mg/ml  . RABEprazole (ACIPHEX) 20 MG tablet Take 1 tablet (20 mg total) by mouth daily.  . ranitidine (ZANTAC) 150 MG capsule Take 300 mg by mouth 3 (three) times daily as needed for heartburn.  . sertraline (ZOLOFT) 100 MG tablet Take 1 tablet (100 mg total) by mouth daily.  . simvastatin (ZOCOR) 40 MG tablet Take 1  tablet (40 mg total) by mouth at bedtime.  . triamterene-hydrochlorothiazide (DYAZIDE) 37.5-25 MG per capsule Take 1 each (1 capsule total) by mouth daily.   No current facility-administered medications on file prior to visit.    Review of Systems Per HPI unless specifically indicated above    Objective:    BP 122/76  Pulse 82  Temp(Src) 98 F (36.7 C) (Oral)  Ht 5' 4.25" (1.632 m)  Wt 152 lb (68.947 kg)  BMI 25.89 kg/m2  SpO2 99%  Physical Exam  Nursing note and vitals reviewed. Constitutional: She appears well-developed and well-nourished. No distress.  Musculoskeletal: She  exhibits no edema.  No pain midline spine No paraspinous mm tenderness Neg SLR bilaterally. No pain with int/ext rotation at hip. Neg FABER. No pain at SIJ or sciatic notch bilaterally. + tender at L GTB and just inferior to this       Assessment & Plan:   Problem List Items Addressed This Visit   GERD, SEVERE     Continue current regimen of aciphex, zantac, pepcid and sucralfate. Consider celebrex if NSAID needed in future.    Lateral pain of left hip - Primary     Anticipate L GTBursitis. Treat with stretching exercises provided today from [facility] pt advisor as well as ice to lateral hip. Continue ibuprofen as up to now with GERD precautions. If no better, rec f/u with SM to discuss steroid injection.        Follow up plan: Return if symptoms worsen or fail to improve.

## 2014-01-28 NOTE — Progress Notes (Signed)
Pre visit review using our clinic review tool, if applicable. No additional management support is needed unless otherwise documented below in the visit note. 

## 2014-01-28 NOTE — Assessment & Plan Note (Addendum)
Continue current regimen of aciphex, zantac, pepcid and sucralfate. Consider celebrex if NSAID needed in future.

## 2014-01-28 NOTE — Patient Instructions (Signed)
I do think you have greater trochanteric bursitis on the left - treat with rest over weekend, exercises provided today, may continue ibuprofen (but watch for worsening GERD) and ice to lateral hip. If no better, call to schedule appointment with Dr. Tamala Julian or [place] our sports medicine doctors to discuss steroid injection Good to meet you today! Call us with questions.

## 2014-01-30 ENCOUNTER — Other Ambulatory Visit: Payer: Self-pay | Admitting: Critical Care Medicine

## 2014-02-01 ENCOUNTER — Encounter: Payer: Self-pay | Admitting: Family Medicine

## 2014-02-01 ENCOUNTER — Ambulatory Visit (INDEPENDENT_AMBULATORY_CARE_PROVIDER_SITE_OTHER): Payer: BC Managed Care – PPO | Admitting: Family Medicine

## 2014-02-01 VITALS — BP 118/82 | HR 88 | Temp 98.7°F | Ht 64.25 in | Wt 150.5 lb

## 2014-02-01 DIAGNOSIS — E538 Deficiency of other specified B group vitamins: Secondary | ICD-10-CM

## 2014-02-01 DIAGNOSIS — M76899 Other specified enthesopathies of unspecified lower limb, excluding foot: Secondary | ICD-10-CM

## 2014-02-01 DIAGNOSIS — K219 Gastro-esophageal reflux disease without esophagitis: Secondary | ICD-10-CM

## 2014-02-01 DIAGNOSIS — D649 Anemia, unspecified: Secondary | ICD-10-CM

## 2014-02-01 DIAGNOSIS — I10 Essential (primary) hypertension: Secondary | ICD-10-CM

## 2014-02-01 DIAGNOSIS — E785 Hyperlipidemia, unspecified: Secondary | ICD-10-CM

## 2014-02-01 DIAGNOSIS — M949 Disorder of cartilage, unspecified: Secondary | ICD-10-CM

## 2014-02-01 DIAGNOSIS — Z1211 Encounter for screening for malignant neoplasm of colon: Secondary | ICD-10-CM

## 2014-02-01 DIAGNOSIS — Z0000 Encounter for general adult medical examination without abnormal findings: Secondary | ICD-10-CM

## 2014-02-01 DIAGNOSIS — M7062 Trochanteric bursitis, left hip: Secondary | ICD-10-CM

## 2014-02-01 DIAGNOSIS — M899 Disorder of bone, unspecified: Secondary | ICD-10-CM

## 2014-02-01 LAB — COMPREHENSIVE METABOLIC PANEL
ALBUMIN: 4.2 g/dL (ref 3.5–5.2)
ALT: 21 U/L (ref 0–35)
AST: 26 U/L (ref 0–37)
Alkaline Phosphatase: 44 U/L (ref 39–117)
BUN: 14 mg/dL (ref 6–23)
CALCIUM: 9.4 mg/dL (ref 8.4–10.5)
CO2: 27 meq/L (ref 19–32)
Chloride: 100 mEq/L (ref 96–112)
Creatinine, Ser: 0.9 mg/dL (ref 0.4–1.2)
GFR: 66.35 mL/min (ref >60.00)
Glucose, Bld: 96 mg/dL (ref 70–99)
POTASSIUM: 3.9 meq/L (ref 3.5–5.1)
Sodium: 136 mEq/L (ref 135–145)
TOTAL PROTEIN: 6.8 g/dL (ref 6.0–8.3)
Total Bilirubin: 0.4 mg/dL (ref 0.3–1.2)

## 2014-02-01 LAB — CBC WITH DIFFERENTIAL/PLATELET
BASOS ABS: 0 10*3/uL (ref 0.0–0.1)
Basophils Relative: 0.4 % (ref 0.0–3.0)
EOS ABS: 0.1 10*3/uL (ref 0.0–0.7)
Eosinophils Relative: 1.8 % (ref 0.0–5.0)
HCT: 40.7 % (ref 36.0–46.0)
Hemoglobin: 13.5 g/dL (ref 12.0–15.0)
LYMPHS PCT: 34.6 % (ref 12.0–46.0)
Lymphs Abs: 2.3 10*3/uL (ref 0.7–4.0)
MCHC: 33.2 g/dL (ref 30.0–36.0)
MCV: 91.6 fl (ref 78.0–100.0)
Monocytes Absolute: 0.4 10*3/uL (ref 0.1–1.0)
Monocytes Relative: 6.5 % (ref 3.0–12.0)
NEUTROS PCT: 56.7 % (ref 43.0–77.0)
Neutro Abs: 3.7 10*3/uL (ref 1.4–7.7)
PLATELETS: 322 10*3/uL (ref 150.0–400.0)
RBC: 4.45 Mil/uL (ref 3.87–5.11)
RDW: 14.8 % — AB (ref 11.5–14.6)
WBC: 6.6 10*3/uL (ref 4.5–10.5)

## 2014-02-01 LAB — IBC PANEL
Iron: 92 ug/dL (ref 42–145)
Saturation Ratios: 20.7 % (ref 20.0–50.0)
TRANSFERRIN: 317.9 mg/dL (ref 212.0–360.0)

## 2014-02-01 LAB — LIPID PANEL
CHOL/HDL RATIO: 4
Cholesterol: 163 mg/dL (ref 0–200)
HDL: 44.2 mg/dL (ref >39.00)
LDL CALC: 94 mg/dL (ref 0–99)
Triglycerides: 125 mg/dL (ref 0.0–149.0)
VLDL: 25 mg/dL (ref 0.0–40.0)

## 2014-02-01 LAB — VITAMIN B12: Vitamin B-12: >1500 pg/mL — ABNORMAL HIGH (ref 211–911)

## 2014-02-01 LAB — FERRITIN: Ferritin: 14.4 ng/mL (ref 10.0–291.0)

## 2014-02-01 LAB — TSH: TSH: 2.59 u[IU]/mL (ref 0.35–5.50)

## 2014-02-01 MED ORDER — CYANOCOBALAMIN 1000 MCG/ML IJ SOLN: 1000.0000 ug | INTRAMUSCULAR | Status: DC

## 2014-02-01 MED ORDER — ALPRAZOLAM 0.5 MG PO TABS: 0.5000 mg | ORAL_TABLET | Freq: Every evening | ORAL | Status: DC | PRN

## 2014-02-01 NOTE — Assessment & Plan Note (Signed)
Continue current regimen.  Check vitamin levels, and continue B12 supplementation.

## 2014-02-01 NOTE — Patient Instructions (Addendum)
Stool kit today Try celebrex instead of other anti inflammatories (samples provided today). Blood work today. Good to see you, let me know how side hip pain is doing after therapy.

## 2014-02-01 NOTE — Assessment & Plan Note (Signed)
Check FLP, continue simvastatin 40mg 

## 2014-02-01 NOTE — Progress Notes (Signed)
BP 118/82  Pulse 88  Temp(Src) 98.7 F (37.1 C) (Oral)  Ht 5' 4.25" (1.632 m)  Wt 150 lb 8 oz (68.266 kg)  BMI 25.63 kg/m2   CC: CPE  Subjective:    Patient ID: Shelby Vazquez, female    DOB: September 08, 1951, 64 y.o.   MRN: [id number]  HPI: Shelby Vazquez is a 63 y.o. female presenting on 02/01/2014 for Annual Exam   Severe GERD - has seen mult GI docs in past.  Some gastric polyps in past. Persistent L hip pain thought bursitis - planned scheduling physical therapy.  Significant ibuprofen use. B12 deficiency - requests 30cc dose to pharmacy.  rec continued monthly supplementation with B12 given severe GERD.  Daughter CMA administers vitamin B12.  Preventative: Colon cancer screening - GI Dr Wallis Mart - with iron deficiency now taking iron.  Last colonoscopy 01/2005 (Dr Ferdinand Lango) normal per patient.  Bad experience 2/2 pain, does not want rpt.  Would consider iFOB and if positive would want to return to see Dr. Wallis Mart to discuss options. Well woman with Dr. Helane Rima - h/o choriocarcinoma.  Last saw around 05/2013. DEXA 2012 - osteopenia Mammogram 05/2013 Flu shot - 07/2013 Pneumovax 01/2013 Td 2011 zostavax 04/2013  Married 1973. 2 daughters '74, '80; 5 grandchildren.  No h/o physical or sexual abuse. 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  Edu: [employer].  Activity: stays active. Diet: good water, fruits/vegetables daily  Relevant past medical, surgical, family and social history reviewed and updated as indicated.  Allergies and medications reviewed and updated. Current Outpatient Prescriptions on File Prior to Visit  Medication Sig  . calcium gluconate 500 MG tablet Take 2 tablets by mouth daily.  Marland Kitchen CARAFATE 1 GM/10ML suspension TAKE 10 MLS BY MOUTH 4 TIMES DAILY  . Cholecalciferol (VITAMIN D3) 2000 UNITS TABS Take 4,000 Units by mouth daily.  . clidinium-chlordiazePOXIDE (LIBRAX) 2.5-5 MG per capsule Take 1 capsule by  mouth 2 (two) times daily.  . famotidine (PEPCID) 40 MG tablet take 2 tablets by mouth at bedtime  . fluticasone (FLONASE) 50 MCG/ACT nasal spray Place 2 sprays into both nostrils daily.  Marland Kitchen ibuprofen (ADVIL,MOTRIN) 200 MG tablet Take by mouth. 3 tablets in the am and 3 tablets at bedtime  . Iron-FA-B Cmp-C-Biot-Probiotic (FUSION PLUS PO) Take by mouth daily.  . Magnesium 250 MG TABS Take 1 tablet by mouth daily.  . polyethylene glycol powder (GLYCOLAX/MIRALAX) powder Take 17 g by mouth as needed.   Marland Kitchen PRESCRIPTION MEDICATION Apply topically 2 (two) times daily. Estradiol Progesterone cream 0.05-30 mg/ml  . RABEprazole (ACIPHEX) 20 MG tablet Take 1 tablet (20 mg total) by mouth daily.  . ranitidine (ZANTAC) 150 MG capsule Take 300 mg by mouth 3 (three) times daily as needed for heartburn.  . sertraline (ZOLOFT) 100 MG tablet Take 1 tablet (100 mg total) by mouth daily.  . simvastatin (ZOCOR) 40 MG tablet Take 1 tablet (40 mg total) by mouth at bedtime.  . triamterene-hydrochlorothiazide (DYAZIDE) 37.5-25 MG per capsule Take 1 each (1 capsule total) by mouth daily.  . benzonatate (TESSALON) 100 MG capsule take 1-2 capsules by mouth every 4 hours if needed for cough PROTOCOL  . dextromethorphan (DELSYM) 30 MG/5ML liquid Take by mouth as needed for cough.  Marland Kitchen HYDROcodone-homatropine (HYCODAN) 5-1.5 MG/5ML syrup Take 5 ML 4 times daily per cough protocol   No current facility-administered medications on file prior to visit.    Review of  Systems  Constitutional: Negative for fever, chills, activity change, appetite change, fatigue and unexpected weight change.  HENT: Positive for sinus pressure. Negative for hearing loss.   Eyes: Negative for visual disturbance.  Respiratory: Positive for cough (cyclical). Negative for chest tightness, shortness of breath and wheezing.   Cardiovascular: Negative for chest pain, palpitations and leg swelling.  Gastrointestinal: Negative for nausea, vomiting,  abdominal pain, diarrhea, constipation, blood in stool and abdominal distention.  Genitourinary: Negative for hematuria and difficulty urinating.  Musculoskeletal: Positive for arthralgias (L lateral hip pain). Negative for myalgias and neck pain.  Skin: Negative for rash.  Neurological: Positive for headaches (sinus). Negative for dizziness, seizures and syncope.  Hematological: Negative for adenopathy. Does not bruise/bleed easily.  Psychiatric/Behavioral: Negative for dysphoric mood. The patient is not nervous/anxious.    Per HPI unless specifically indicated above    Objective:    BP 118/82  Pulse 88  Temp(Src) 98.7 F (37.1 C) (Oral)  Ht 5' 4.25" (1.632 m)  Wt 150 lb 8 oz (68.266 kg)  BMI 25.63 kg/m2  Physical Exam  Nursing note and vitals reviewed. Constitutional: She is oriented to person, place, and time. She appears well-developed and well-nourished. No distress.  HENT:  Head: Normocephalic and atraumatic.  Right Ear: Hearing, tympanic membrane, external ear and ear canal normal.  Left Ear: Hearing, tympanic membrane, external ear and ear canal normal.  Nose: Nose normal.  Mouth/Throat: Uvula is midline, oropharynx is clear and moist and mucous membranes are normal. No oropharyngeal exudate, posterior oropharyngeal edema or posterior oropharyngeal erythema.  Eyes: Conjunctivae and EOM are normal. Pupils are equal, round, and reactive to light. No scleral icterus.  Neck: Normal range of motion. Neck supple. No thyromegaly present.  Cardiovascular: Normal rate, regular rhythm, normal heart sounds and intact distal pulses.   No murmur heard. Pulses:      Radial pulses are 2+ on the right side, and 2+ on the left side.  Pulmonary/Chest: Effort normal and breath sounds normal. No respiratory distress. She has no wheezes. She has no rales.  Breast exam deferred  Abdominal: Soft. Bowel sounds are normal. She exhibits no distension and no mass. There is no tenderness. There is no  rebound and no guarding.  Genitourinary:  Pelvic deferred  Musculoskeletal: Normal range of motion. She exhibits no edema.  Lymphadenopathy:    She has no cervical adenopathy.  Neurological: She is alert and oriented to person, place, and time.  CN grossly intact, station and gait intact  Skin: Skin is warm and dry. No rash noted.  Psychiatric: She has a normal mood and affect. Her behavior is normal. Judgment and thought content normal.       Assessment & Plan:   Problem List Items Addressed This Visit   B12 DEFICIENCY   Relevant Medications      cyanocobalamin (,VITAMIN B-12,) 1000 MCG/ML injection   Other Relevant Orders      Vitamin B12   HYPERLIPIDEMIA     Check FLP, continue simvastatin 40mg     Relevant Orders      Lipid panel      Comprehensive metabolic panel   ESSENTIAL HYPERTENSION     Chronic, stable. Continue maxzide.    Relevant Orders      Comprehensive metabolic panel      TSH   GERD, SEVERE     Continue current regimen.  Check vitamin levels, and continue B12 supplementation.    Relevant Medications      Probiotic Product (PROBIOTIC PO)  OSTEOPENIA   Relevant Orders      Vit D  25 hydroxy (rtn osteoporosis monitoring)   Routine health maintenance - Primary     Preventative protocols reviewed and updated unless pt declined. Discussed healthy diet and lifestyle. Well woman with Dr. Helane Rima.    Greater trochanteric bursitis of left hip     Unchanged to slight improvement. Trial of celebrex instead of significant ibuprofen use - discussed increased CV risk of med.     Other Visit Diagnoses   Anemia        Relevant Medications       cyanocobalamin (,VITAMIN B-12,) 1000 MCG/ML injection    Other Relevant Orders       IBC panel       Ferritin       CBC with Differential    Special screening for malignant neoplasms, colon        Relevant Orders       Fecal occult blood, imunochemical        Follow up plan: Return in about 1 year (around  02/02/2015), or as needed, for annual exam, prior fasting for blood work.

## 2014-02-01 NOTE — Assessment & Plan Note (Signed)
Unchanged to slight improvement. Trial of celebrex instead of significant ibuprofen use - discussed increased CV risk of med.

## 2014-02-01 NOTE — Assessment & Plan Note (Signed)
Chronic, stable. Continue maxzide. 

## 2014-02-01 NOTE — Progress Notes (Signed)
Pre visit review using our clinic review tool, if applicable. No additional management support is needed unless otherwise documented below in the visit note. 

## 2014-02-01 NOTE — Assessment & Plan Note (Signed)
Preventative protocols reviewed and updated unless pt declined. Discussed healthy diet and lifestyle. Well woman with Dr. Helane Rima.

## 2014-02-02 LAB — VITAMIN D 25 HYDROXY (VIT D DEFICIENCY, FRACTURES): VIT D 25 HYDROXY: 80 ng/mL (ref 30–89)

## 2014-02-03 ENCOUNTER — Other Ambulatory Visit: Payer: BC Managed Care – PPO

## 2014-02-03 DIAGNOSIS — Z1211 Encounter for screening for malignant neoplasm of colon: Secondary | ICD-10-CM

## 2014-02-03 LAB — FECAL OCCULT BLOOD, IMMUNOCHEMICAL: Fecal Occult Bld: POSITIVE — AB

## 2014-02-08 ENCOUNTER — Encounter: Payer: Self-pay | Admitting: Family Medicine

## 2014-02-08 ENCOUNTER — Ambulatory Visit (INDEPENDENT_AMBULATORY_CARE_PROVIDER_SITE_OTHER): Payer: BC Managed Care – PPO | Admitting: Family Medicine

## 2014-02-08 VITALS — BP 120/82 | HR 88 | Ht 65.0 in | Wt 150.0 lb

## 2014-02-08 DIAGNOSIS — M76899 Other specified enthesopathies of unspecified lower limb, excluding foot: Secondary | ICD-10-CM

## 2014-02-08 DIAGNOSIS — M7062 Trochanteric bursitis, left hip: Secondary | ICD-10-CM

## 2014-02-08 MED ORDER — METHYLPREDNISOLONE ACETATE 40 MG/ML IJ SUSP
40.0000 mg | Freq: Once | INTRAMUSCULAR | Status: AC
  Administered 2014-02-08: 40 mg via INTRA_ARTICULAR

## 2014-02-08 NOTE — Patient Instructions (Signed)
You have trochanteric bursitis. Avoid painful activities as much as possible. Ice over area of pain 3-4 times a day for 15 minutes at a time Hip abduction exercise 3 x 10 once a day - add weights if this becomes too easy. You were given a cortisone shot today. Stretches - pick 2 and hold for 20-30 seconds x 3 - do once or twice a day. Continue physical therapy. Ibuprofen or celebrex as needed for pain. Follow up with me in 1 month.

## 2014-02-09 ENCOUNTER — Encounter: Payer: Self-pay | Admitting: Family Medicine

## 2014-02-09 DIAGNOSIS — M7062 Trochanteric bursitis, left hip: Secondary | ICD-10-CM | POA: Insufficient documentation

## 2014-02-09 NOTE — Progress Notes (Signed)
Patient ID: Shelby Vazquez, female   DOB: Feb 22, 1951, 63 y.o.   MRN: [id number]  PCP: Adella Hare, MD  Subjective:   HPI: Patient is a 63 y.o. female here for left hip pain.  Patient reports for about 3 weeks she has had lateral left hip pain. To a lesser extent she has had mild pain dating back to August when she tripped and struck a boulder with left lateral hip. Been difficult to sleep on this side since then. Went for a walk with her grandson on 3/23 and that night pain was very severe. No pain in right side. Has tried ibuprofen, celebrex. She did just start physical therapy yesterday and today has improved. Goes down lateral left leg.  Past Medical History  Diagnosis Date  . Chronic anxiety   . B12 deficiency   . IBS (irritable bowel syndrome)   . Osteopenia   . Hypertension   . Uterine prolapse   . Hyperlipidemia   . Vocal cord paralysis, bilateral partial 2001    evaluated at [facility] - did not require surgery  . 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 Bronchitis, acute 10/22/12  . Cough   . GERD (gastroesophageal reflux disease)     severe leading to chronic cough and hoarseness    Current Outpatient Prescriptions on File Prior to Visit  Medication Sig Dispense Refill  . ALPRAZolam (XANAX) 0.5 MG tablet Take 1 tablet (0.5 mg total) by mouth at bedtime as needed for sleep.  30 tablet  3  . calcium gluconate 500 MG tablet Take 2 tablets by mouth daily.      Marland Kitchen CARAFATE 1 GM/10ML suspension TAKE 10 MLS BY MOUTH 4 TIMES DAILY  1200 mL  5  . celecoxib (CELEBREX) 200 MG capsule Take 200 mg by mouth daily as needed.      . Cholecalciferol (VITAMIN D3) 2000 UNITS TABS Take 4,000 Units by mouth daily.      . clidinium-chlordiazePOXIDE (LIBRAX) 2.5-5 MG per capsule Take 1 capsule by mouth 2 (two) times daily.  180 capsule  3  . cyanocobalamin (,VITAMIN B-12,) 1000 MCG/ML injection Inject 1 mL (1,000 mcg total) into the  muscle every 30 (thirty) days.  30 mL  0  . dextromethorphan (DELSYM) 30 MG/5ML liquid Take by mouth as needed for cough.      . famotidine (PEPCID) 40 MG tablet take 2 tablets by mouth at bedtime  60 tablet  5  . fluticasone (FLONASE) 50 MCG/ACT nasal spray Place 2 sprays into both nostrils daily.  16 g  0  . HYDROcodone-homatropine (HYCODAN) 5-1.5 MG/5ML syrup Take 5 ML 4 times daily per cough protocol  480 mL  0  . ibuprofen (ADVIL,MOTRIN) 200 MG tablet Take by mouth. 3 tablets in the am and 3 tablets at bedtime      . Iron-FA-B Cmp-C-Biot-Probiotic (FUSION PLUS PO) Take by mouth daily.      Marland Kitchen loratadine (CLARITIN) 10 MG tablet Take 10 mg by mouth daily as needed for allergies.      . Magnesium 250 MG TABS Take 1 tablet by mouth daily.      . polyethylene glycol powder (GLYCOLAX/MIRALAX) powder Take 17 g by mouth as needed.       Marland Kitchen PRESCRIPTION MEDICATION Apply topically 2 (two) times daily. Estradiol Progesterone cream 0.05-30 mg/ml      . Probiotic Product (PROBIOTIC PO) Take 1 capsule by mouth daily. Digestive Advantage      .  RABEprazole (ACIPHEX) 20 MG tablet Take 1 tablet (20 mg total) by mouth daily.  90 tablet  3  . ranitidine (ZANTAC) 150 MG capsule Take 300 mg by mouth 3 (three) times daily as needed for heartburn.      . sertraline (ZOLOFT) 100 MG tablet Take 1 tablet (100 mg total) by mouth daily.  90 tablet  3  . simvastatin (ZOCOR) 40 MG tablet Take 1 tablet (40 mg total) by mouth at bedtime.  90 tablet  3  . triamterene-hydrochlorothiazide (DYAZIDE) 37.5-25 MG per capsule Take 1 each (1 capsule total) by mouth daily.  90 capsule  3   No current facility-administered medications on file prior to visit.    Past Surgical History  Procedure Laterality Date  . Cataract extraction      right 01/2010, left may 2011  . Bunionectomy      [place] '2000  . Dilation and curettage of uterus  '77    for molar pregnancy excision    Allergies  Allergen Reactions  . Augmentin  [Amoxicillin-Pot Clavulanate]     Severe diarrhea, rash, worsened acid reflux  . Erythromycin     History   Social History  . Marital Status: Married    Spouse Name: N/A    Number of Children: 2  . Years of Education: 16   Occupational History  . teacher    Social History Main Topics  . Smoking status: Never Smoker   . Smokeless tobacco: Never Used  . Alcohol Use: Yes     Comment: twice a year  . Drug Use: No  . Sexual Activity: Yes    Partners: Male   Other Topics Concern  . Not on file   Social History Narrative   Married 1973. 2 daughters '74, '80; 5 grandchildren.    No h/o physical or sexual abuse. 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    Edu: [employer].    Activity: stays active.   Diet: good water, fruits/vegetables daily    Family History  Problem Relation Age of Onset  . Cancer Father   . Cancer Maternal Aunt     breast  . Heart disease Mother   . Hyperlipidemia Mother   . Hypertension Mother   . Heart attack Mother   . Heart disease Maternal Grandmother   . Heart disease Maternal Grandfather   . Heart disease Paternal Grandmother   . Heart disease Paternal Grandfather   . Diabetes Neg Hx   . Hyperlipidemia Brother   . Hypertension Brother     BP 120/82  Pulse 88  Ht 5\' 5"  (1.651 m)  Wt 150 lb (68.04 kg)  BMI 24.96 kg/m2  Review of Systems: See HPI above.    Objective:  Physical Exam:  Gen: NAD  Left hip/back: No gross deformity, scoliosis. TTP greater trochanter.  No midline or bony TTP.  No other hip/back tenderness. FROM. Strength LEs 5/5 all muscle groups.  2+ MSRs in patellar and achilles tendons, equal bilaterally. Negative SLRs. Sensation intact to light touch bilaterally. Negative logroll bilateral hips Negative fabers and piriformis stretches.    Assessment & Plan:  1. Left trochanteric bursitis - Discussed options - went ahead with injection today.  She  will continue PT also and home exercises.  Icing, ibuprofen or celebrex, tylenol.  F/u in 1 month.  After informed written consent patient was lying on right side on exam table. Area overlying left trochanteric  bursa prepped with alcohol swab then bursa injected with 6:2 marcaine: depomedrol. Patient tolerated procedure well without immediate complications.

## 2014-02-09 NOTE — Assessment & Plan Note (Signed)
Discussed options - went ahead with injection today.  She will continue PT also and home exercises.  Icing, ibuprofen or celebrex, tylenol.  F/u in 1 month.  After informed written consent patient was lying on right side on exam table. Area overlying left trochanteric bursa prepped with alcohol swab then bursa injected with 6:2 marcaine: depomedrol. Patient tolerated procedure well without immediate complications.

## 2014-03-08 ENCOUNTER — Encounter: Payer: Self-pay | Admitting: Family Medicine

## 2014-03-08 ENCOUNTER — Ambulatory Visit (INDEPENDENT_AMBULATORY_CARE_PROVIDER_SITE_OTHER): Payer: BC Managed Care – PPO | Admitting: Family Medicine

## 2014-03-08 VITALS — BP 110/72 | HR 99 | Ht 65.0 in | Wt 150.0 lb

## 2014-03-08 DIAGNOSIS — M76899 Other specified enthesopathies of unspecified lower limb, excluding foot: Secondary | ICD-10-CM

## 2014-03-08 DIAGNOSIS — M7062 Trochanteric bursitis, left hip: Secondary | ICD-10-CM

## 2014-03-09 ENCOUNTER — Encounter: Payer: Self-pay | Admitting: Family Medicine

## 2014-03-09 NOTE — Assessment & Plan Note (Signed)
much better following injection and PT/HEP.  Continue with PT and transition to HEP.  Ibuprofen as needed.  F/u prn.

## 2014-03-09 NOTE — Progress Notes (Signed)
Patient ID: Shelby Vazquez, female   DOB: 1951-02-13, 63 y.o.   MRN: [id number]  PCP: Adella Hare, MD  Subjective:   HPI: Patient is a 63 y.o. female here for left hip pain.  4/21: Patient reports for about 3 weeks she has had lateral left hip pain. To a lesser extent she has had mild pain dating back to August when she tripped and struck a boulder with left lateral hip. Been difficult to sleep on this side since then. Went for a walk with her grandson on 3/23 and that night pain was very severe. No pain in right side. Has tried ibuprofen, celebrex. She did just start physical therapy yesterday and today has improved. Goes down lateral left leg.  5/19: Patient reports hip is much improved with injection and doing physical therapy twice a week. Doing HEP daily. Takes ibuprofen. Able to sleep at night now. No other complaints.  Past Medical History  Diagnosis Date  . Chronic anxiety   . B12 deficiency   . IBS (irritable bowel syndrome)   . Osteopenia   . Hypertension   . Uterine prolapse   . Hyperlipidemia   . Vocal cord paralysis, bilateral partial 2001    evaluated at [facility] - did not require surgery  . 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 Bronchitis, acute 10/22/12  . Cough   . GERD (gastroesophageal reflux disease)     severe leading to chronic cough and hoarseness    Current Outpatient Prescriptions on File Prior to Visit  Medication Sig Dispense Refill  . ALPRAZolam (XANAX) 0.5 MG tablet Take 1 tablet (0.5 mg total) by mouth at bedtime as needed for sleep.  30 tablet  3  . calcium gluconate 500 MG tablet Take 2 tablets by mouth daily.      Marland Kitchen CARAFATE 1 GM/10ML suspension TAKE 10 MLS BY MOUTH 4 TIMES DAILY  1200 mL  5  . celecoxib (CELEBREX) 200 MG capsule Take 200 mg by mouth daily as needed.      . Cholecalciferol (VITAMIN D3) 2000 UNITS TABS Take 4,000 Units by mouth daily.      .  clidinium-chlordiazePOXIDE (LIBRAX) 2.5-5 MG per capsule Take 1 capsule by mouth 2 (two) times daily.  180 capsule  3  . cyanocobalamin (,VITAMIN B-12,) 1000 MCG/ML injection Inject 1 mL (1,000 mcg total) into the muscle every 30 (thirty) days.  30 mL  0  . dextromethorphan (DELSYM) 30 MG/5ML liquid Take by mouth as needed for cough.      . famotidine (PEPCID) 40 MG tablet take 2 tablets by mouth at bedtime  60 tablet  5  . fluticasone (FLONASE) 50 MCG/ACT nasal spray Place 2 sprays into both nostrils daily.  16 g  0  . HYDROcodone-homatropine (HYCODAN) 5-1.5 MG/5ML syrup Take 5 ML 4 times daily per cough protocol  480 mL  0  . ibuprofen (ADVIL,MOTRIN) 200 MG tablet Take by mouth. 3 tablets in the am and 3 tablets at bedtime      . Iron-FA-B Cmp-C-Biot-Probiotic (FUSION PLUS PO) Take by mouth daily.      Marland Kitchen loratadine (CLARITIN) 10 MG tablet Take 10 mg by mouth daily as needed for allergies.      . Magnesium 250 MG TABS Take 1 tablet by mouth daily.      . polyethylene glycol powder (GLYCOLAX/MIRALAX) powder Take 17 g by mouth as needed.       Marland Kitchen  PRESCRIPTION MEDICATION Apply topically 2 (two) times daily. Estradiol Progesterone cream 0.05-30 mg/ml      . Probiotic Product (PROBIOTIC PO) Take 1 capsule by mouth daily. Digestive Advantage      . RABEprazole (ACIPHEX) 20 MG tablet Take 1 tablet (20 mg total) by mouth daily.  90 tablet  3  . ranitidine (ZANTAC) 150 MG capsule Take 300 mg by mouth 3 (three) times daily as needed for heartburn.      . sertraline (ZOLOFT) 100 MG tablet Take 1 tablet (100 mg total) by mouth daily.  90 tablet  3  . simvastatin (ZOCOR) 40 MG tablet Take 1 tablet (40 mg total) by mouth at bedtime.  90 tablet  3  . triamterene-hydrochlorothiazide (DYAZIDE) 37.5-25 MG per capsule Take 1 each (1 capsule total) by mouth daily.  90 capsule  3   No current facility-administered medications on file prior to visit.    Past Surgical History  Procedure Laterality Date  . Cataract  extraction      right 01/2010, left may 2011  . Bunionectomy      Regal '2000  . Dilation and curettage of uterus  '77    for molar pregnancy excision    Allergies  Allergen Reactions  . Augmentin [Amoxicillin-Pot Clavulanate]     Severe diarrhea, rash, worsened acid reflux  . Erythromycin     History   Social History  . Marital Status: Married    Spouse Name: N/A    Number of Children: 2  . Years of Education: 16   Occupational History  . teacher    Social History Main Topics  . Smoking status: Never Smoker   . Smokeless tobacco: Never Used  . Alcohol Use: Yes     Comment: twice a year  . Drug Use: No  . Sexual Activity: Yes    Partners: Male   Other Topics Concern  . Not on file   Social History Narrative   Married 1973. 2 daughters '74, '80; 5 grandchildren.    No h/o physical or sexual abuse. 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    Edu: [employer].    Activity: stays active.   Diet: good water, fruits/vegetables daily    Family History  Problem Relation Age of Onset  . Cancer Father   . Cancer Maternal Aunt     breast  . Heart disease Mother   . Hyperlipidemia Mother   . Hypertension Mother   . Heart attack Mother   . Heart disease Maternal Grandmother   . Heart disease Maternal Grandfather   . Heart disease Paternal Grandmother   . Heart disease Paternal Grandfather   . Diabetes Neg Hx   . Hyperlipidemia Brother   . Hypertension Brother     BP 110/72  Pulse 99  Ht 5\' 5"  (1.651 m)  Wt 150 lb (68.04 kg)  BMI 24.96 kg/m2  Review of Systems: See HPI above.    Objective:  Physical Exam:  Gen: NAD  Left hip/back: No gross deformity, scoliosis. Minimal TTP greater trochanter.  No midline or bony TTP.  No other hip/back tenderness. FROM. Strength LEs 5/5 all muscle groups.  2+ MSRs in patellar and achilles tendons, equal bilaterally. Negative SLRs. Sensation intact to  light touch bilaterally. Negative logroll bilateral hips Negative fabers and piriformis stretches.    Assessment & Plan:  1. Left trochanteric bursitis - much better following injection and PT/HEP.  Continue  with PT and transition to HEP.  Ibuprofen as needed.  F/u prn.

## 2014-03-15 ENCOUNTER — Encounter (HOSPITAL_COMMUNITY): Payer: Self-pay | Admitting: Pharmacy Technician

## 2014-03-24 ENCOUNTER — Encounter (HOSPITAL_COMMUNITY): Payer: Self-pay | Admitting: *Deleted

## 2014-03-31 ENCOUNTER — Encounter (HOSPITAL_COMMUNITY): Payer: Self-pay | Admitting: *Deleted

## 2014-03-31 ENCOUNTER — Encounter (HOSPITAL_COMMUNITY): Payer: BC Managed Care – PPO | Admitting: Anesthesiology

## 2014-03-31 ENCOUNTER — Ambulatory Visit (HOSPITAL_COMMUNITY): Payer: BC Managed Care – PPO | Admitting: Anesthesiology

## 2014-03-31 ENCOUNTER — Encounter (HOSPITAL_COMMUNITY)
Admission: RE | Disposition: A | Discharge status: Home / Self Care | Payer: Self-pay | Source: Ambulatory Visit | Attending: Gastroenterology

## 2014-03-31 ENCOUNTER — Ambulatory Visit (HOSPITAL_COMMUNITY)
Admission: RE | Admit: 2014-03-31 | Discharge: 2014-03-31 | Disposition: A | Discharge status: Home / Self Care | Payer: BC Managed Care – PPO | Source: Ambulatory Visit | Attending: Gastroenterology | Admitting: Gastroenterology

## 2014-03-31 DIAGNOSIS — Z881 Allergy status to other antibiotic agents status: Secondary | ICD-10-CM | POA: Insufficient documentation

## 2014-03-31 DIAGNOSIS — Z860100 Personal history of colon polyps, unspecified: Secondary | ICD-10-CM | POA: Insufficient documentation

## 2014-03-31 DIAGNOSIS — E785 Hyperlipidemia, unspecified: Secondary | ICD-10-CM | POA: Insufficient documentation

## 2014-03-31 DIAGNOSIS — K921 Melena: Secondary | ICD-10-CM | POA: Insufficient documentation

## 2014-03-31 DIAGNOSIS — I10 Essential (primary) hypertension: Secondary | ICD-10-CM | POA: Insufficient documentation

## 2014-03-31 DIAGNOSIS — K449 Diaphragmatic hernia without obstruction or gangrene: Secondary | ICD-10-CM | POA: Insufficient documentation

## 2014-03-31 DIAGNOSIS — K5730 Diverticulosis of large intestine without perforation or abscess without bleeding: Secondary | ICD-10-CM | POA: Insufficient documentation

## 2014-03-31 DIAGNOSIS — D126 Benign neoplasm of colon, unspecified: Secondary | ICD-10-CM | POA: Insufficient documentation

## 2014-03-31 DIAGNOSIS — K589 Irritable bowel syndrome without diarrhea: Secondary | ICD-10-CM | POA: Insufficient documentation

## 2014-03-31 DIAGNOSIS — Z1211 Encounter for screening for malignant neoplasm of colon: Secondary | ICD-10-CM | POA: Insufficient documentation

## 2014-03-31 DIAGNOSIS — D131 Benign neoplasm of stomach: Secondary | ICD-10-CM | POA: Insufficient documentation

## 2014-03-31 DIAGNOSIS — Z880 Allergy status to penicillin: Secondary | ICD-10-CM | POA: Insufficient documentation

## 2014-03-31 DIAGNOSIS — M899 Disorder of bone, unspecified: Secondary | ICD-10-CM | POA: Insufficient documentation

## 2014-03-31 DIAGNOSIS — K219 Gastro-esophageal reflux disease without esophagitis: Secondary | ICD-10-CM | POA: Insufficient documentation

## 2014-03-31 DIAGNOSIS — M949 Disorder of cartilage, unspecified: Secondary | ICD-10-CM

## 2014-03-31 DIAGNOSIS — F411 Generalized anxiety disorder: Secondary | ICD-10-CM | POA: Insufficient documentation

## 2014-03-31 DIAGNOSIS — Z79899 Other long term (current) drug therapy: Secondary | ICD-10-CM | POA: Insufficient documentation

## 2014-03-31 HISTORY — PX: ESOPHAGOGASTRODUODENOSCOPY: SHX5428

## 2014-03-31 HISTORY — DX: Adverse effect of unspecified anesthetic, initial encounter: T41.45XA

## 2014-03-31 HISTORY — DX: Other complications of anesthesia, initial encounter: T88.59XA

## 2014-03-31 HISTORY — PX: COLONOSCOPY WITH PROPOFOL: SHX5780

## 2014-03-31 SURGERY — EGD (ESOPHAGOGASTRODUODENOSCOPY)
Anesthesia: Monitor Anesthesia Care

## 2014-03-31 MED ORDER — PROPOFOL 10 MG/ML IV BOLUS
INTRAVENOUS | Status: AC
  Filled 2014-03-31: qty 20

## 2014-03-31 MED ORDER — FENTANYL CITRATE 0.05 MG/ML IJ SOLN
INTRAMUSCULAR | Status: AC
  Filled 2014-03-31: qty 2

## 2014-03-31 MED ORDER — PROPOFOL 10 MG/ML IV BOLUS
INTRAVENOUS | Status: DC | PRN
  Administered 2014-03-31: 10 mg via INTRAVENOUS

## 2014-03-31 MED ORDER — PHENYLEPHRINE HCL 10 MG/ML IJ SOLN
INTRAMUSCULAR | Status: DC | PRN
  Administered 2014-03-31 (×2): 80 ug via INTRAVENOUS

## 2014-03-31 MED ORDER — PHENYLEPHRINE 40 MCG/ML (10ML) SYRINGE FOR IV PUSH (FOR BLOOD PRESSURE SUPPORT)
PREFILLED_SYRINGE | INTRAVENOUS | Status: AC
  Filled 2014-03-31: qty 10

## 2014-03-31 MED ORDER — DEXAMETHASONE SODIUM PHOSPHATE 10 MG/ML IJ SOLN
INTRAMUSCULAR | Status: AC
  Filled 2014-03-31: qty 1

## 2014-03-31 MED ORDER — GLYCOPYRROLATE 0.2 MG/ML IJ SOLN
INTRAMUSCULAR | Status: AC
  Filled 2014-03-31: qty 1

## 2014-03-31 MED ORDER — GLYCOPYRROLATE 0.2 MG/ML IJ SOLN
INTRAMUSCULAR | Status: DC | PRN
  Administered 2014-03-31 (×2): 0.1 mg via INTRAVENOUS

## 2014-03-31 MED ORDER — LACTATED RINGERS IV SOLN
INTRAVENOUS | Status: DC
  Administered 2014-03-31: 14:00:00 via INTRAVENOUS
  Administered 2014-03-31: 1000 mL via INTRAVENOUS

## 2014-03-31 MED ORDER — PROPOFOL INFUSION 10 MG/ML OPTIME
INTRAVENOUS | Status: DC | PRN
  Administered 2014-03-31: 100 ug/kg/min via INTRAVENOUS

## 2014-03-31 MED ORDER — FENTANYL CITRATE 0.05 MG/ML IJ SOLN
INTRAMUSCULAR | Status: DC | PRN
  Administered 2014-03-31: 100 ug via INTRAVENOUS
  Administered 2014-03-31: 25 ug via INTRAVENOUS

## 2014-03-31 MED ORDER — ONDANSETRON HCL 4 MG/2ML IJ SOLN
INTRAMUSCULAR | Status: AC
  Filled 2014-03-31: qty 2

## 2014-03-31 MED ORDER — LIDOCAINE HCL (CARDIAC) 20 MG/ML IV SOLN
INTRAVENOUS | Status: AC
  Filled 2014-03-31: qty 5

## 2014-03-31 MED ORDER — ONDANSETRON HCL 4 MG/2ML IJ SOLN
INTRAMUSCULAR | Status: DC | PRN
  Administered 2014-03-31: 4 mg via INTRAVENOUS

## 2014-03-31 MED ORDER — ESMOLOL HCL 10 MG/ML IV SOLN
INTRAVENOUS | Status: DC | PRN
  Administered 2014-03-31 (×2): 4 mg via INTRAVENOUS

## 2014-03-31 MED ORDER — SODIUM CHLORIDE 0.9 % IV SOLN: INTRAVENOUS | Status: DC

## 2014-03-31 SURGICAL SUPPLY — 21 items

## 2014-03-31 NOTE — Anesthesia Preprocedure Evaluation (Addendum)
Anesthesia Evaluation  Patient identified by MRN, date of birth, ID band Patient awake    Reviewed: Allergy & Precautions, H&P , NPO status , Patient's Chart, lab work & pertinent test results  History of Anesthesia Complications (+) AWARENESS UNDER ANESTHESIA and history of anesthetic complications  Airway Mallampati: II TM Distance: >3 FB Neck ROM: Full    Dental  (+) Teeth Intact, Dental Advisory Given   Pulmonary neg pulmonary ROS,  Bilateral vocal cord paralysis. breath sounds clear to auscultation  Pulmonary exam normal       Cardiovascular hypertension, Pt. on medications Rhythm:Regular Rate:Normal     Neuro/Psych  Headaches, Anxiety negative neurological ROS  negative psych ROS   GI/Hepatic Neg liver ROS, GERD-  Medicated,  Endo/Other  negative endocrine ROS  Renal/GU negative Renal ROS  negative genitourinary   Musculoskeletal negative musculoskeletal ROS (+)   Abdominal   Peds  Hematology negative hematology ROS (+)   Anesthesia Other Findings   Reproductive/Obstetrics                          Anesthesia Physical Anesthesia Plan  ASA: II  Anesthesia Plan: MAC   Post-op Pain Management:    Induction: Intravenous  Airway Management Planned: Nasal Cannula and Simple Face Mask  Additional Equipment:   Intra-op Plan:   Post-operative Plan:   Informed Consent: I have reviewed the patients History and Physical, chart, labs and discussed the procedure including the risks, benefits and alternatives for the proposed anesthesia with the patient or authorized representative who has indicated his/her understanding and acceptance.   Dental advisory given  Plan Discussed with: CRNA  Anesthesia Plan Comments:         Anesthesia Quick Evaluation

## 2014-03-31 NOTE — Discharge Instructions (Signed)
No aspirin or nonsteroidals for 2 weeks;   Call us if you see bleeding from the rectum.

## 2014-03-31 NOTE — Op Note (Signed)
[facility] [place], [address]   COLONOSCOPY PROCEDURE REPORT  PATIENT: Shelby, Vazquez.  MR#: [id number] BIRTHDATE: 04-Dec-1950 , 63  yrs. old GENDER: Female ENDOSCOPIST: Ronald Lobo, MD REFERRED BY:   Dr. Ria Bush PROCEDURE DATE:  03/31/2014 PROCEDURE:     colonoscopy with polypectomy, corrected submucosal injection, and biopsy ASA CLASS: INDICATIONS:  transiently Hemoccult positive stool, periodic diarrhea MEDICATIONS:    MAC per anesthesia  DESCRIPTION OF PROCEDURE: the patient came as an outpatient to the [facility] long endoscopy unit, provided written consent, and was sedated by anesthesia, after which time out was performed. The patient remained stable throughout the procedure.  CO2 insufflation was used for this procedure because the patient had had a rough time with her previous exam in another community and I want to minimize post procedural distention, bloating, and discomfort.  The Pentax pediatric video colonoscope was advanced with moderate difficulty through a somewhat fixated sigmoid region and also around some fairly tight angulations, ultimately reaching the cecum and being advanced for short distance into a normal-appearing terminal ileum, whereupon pullback was performed.  The sigmoid region had mild to moderate diverticulosis with associated fixation.  There was no evidence of colitis anywhere in the colon, nor of vascular ectasia.  3 sessile polyps were removed during this exam. The first was in the descending colon and was removed primarily by cold snare, with a little bit of heat at the end. This polyp was cut in 2 pieces and suctioned through the scope. The polypectomy site was free of bleeding or evidence of excessive cautery. The polyp is estimated to have been approximately 4 x 9 mm in size, fairly flat or sessile in character, and pale in appearance.  In the mid transverse colon, a somewhat larger  polyp measuring about 15 mm across, but pale in color and sessile in morphology, was removed after submucosal injection with saline to elevate it, and then using hot snare technique. The lesion was retrieved by withdrawing it with the scope out through the anus. The eschar looked clean and not daily.  A small sessile polyp near the polyp in the transverse colon was removed by cold snare technique and successfully retrieved.  No masses, colitis, or vascular ectasia were noted on this exam. No source of the patient's heme positivity was endoscopically evident.  Because of the patient's history of intermittent severe diarrhea, random mucosal biopsies were taken along the length of the colon, except the rectum.  The patient tolerated the procedure well overall    COMPLICATIONS: None  ENDOSCOPIC IMPRESSION:  1. No source of heme positivity identified on this exam. It is felt most likely to have come from the upper tract, based on the mild hemorrhagic gastritis noted on today's endoscopy. 2. Several small to medium-sized colon polyps removed as described above 3. Sigmoid diverticulosis and fixation. Somewhat difficult exam, technically  RECOMMENDATIONS:  1. Await pathology on the polyps    _______________________________ eSigned:  Ronald Lobo, MD 03/31/2014 3:19 PM     PATIENT NAME:  Shelby Vazquez. MR#: [id number]

## 2014-03-31 NOTE — H&P (Signed)
Shelby Vazquez is an 63 y.o. female.   Chief Complaint: Heme positive stool HPI: The patient was seen by me recently in the office for evaluation of heme-positive stool. She is on ibuprofen, but also on a PPI. She has had nausea, but is on iron. She has had 2 colonoscopies in the past, most recently about 9 years ago. There is a remote history of a small rectal polyp, histology unknown. No visible rectal bleeding.  Past Medical History  Diagnosis Date  . Chronic anxiety   . B12 deficiency   . IBS (irritable bowel syndrome)   . Osteopenia   . Uterine prolapse   . Hyperlipidemia   . Vocal cord paralysis, bilateral partial 2001    evaluated at [facility] - did not require surgery  . 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 Cough   . GERD (gastroesophageal reflux disease)     severe leading to chronic cough and hoarseness  . Hypertension   . Bronchitis, acute   . Complication of anesthesia 9 yrs ago    awake  and in severe  pain with  colonscopy  . Family history of anesthesia complication     mother ponv    Past Surgical History  Procedure Laterality Date  . Cataract extraction      right 01/2010, left may 2011  . Bunionectomy      Regal '2000  . Dilation and curettage of uterus  '77    for molar pregnancy excision  . Esophagogastroduodenoscopy endoscopy  april 2014    Family History  Problem Relation Age of Onset  . Cancer Father   . Cancer Maternal Aunt     breast  . Heart disease Mother   . Hyperlipidemia Mother   . Hypertension Mother   . Heart attack Mother   . Heart disease Maternal Grandmother   . Heart disease Maternal Grandfather   . Heart disease Paternal Grandmother   . Heart disease Paternal Grandfather   . Diabetes Neg Hx   . Hyperlipidemia Brother   . Hypertension Brother    Social History:  reports that she has never smoked. She has never used smokeless tobacco. She reports that she drinks alcohol.  She reports that she does not use illicit drugs.  Allergies:  Allergies  Allergen Reactions  . Augmentin [Amoxicillin-Pot Clavulanate]     Severe diarrhea, rash, worsened acid reflux  . Erythromycin Rash    Medications Prior to Admission  Medication Sig Dispense Refill  . calcium gluconate 500 MG tablet Take 2 tablets by mouth daily.      . clidinium-chlordiazePOXIDE (LIBRAX) 5-2.5 MG per capsule Take 1 capsule by mouth 2 (two) times daily.      . Cyanocobalamin (VITAMIN B-12 IJ) Inject as directed every 30 (thirty) days.      . famotidine (PEPCID) 40 MG tablet Take 80 mg by mouth at bedtime.      . fluticasone (FLONASE) 50 MCG/ACT nasal spray Place 2 sprays into both nostrils daily.      Marland Kitchen ibuprofen (ADVIL,MOTRIN) 200 MG tablet Take 600 mg by mouth every 6 (six) hours as needed for mild pain or moderate pain. 3 tablets in the am and 3 tablets at bedtime      . Iron-FA-B Cmp-C-Biot-Probiotic (FUSION PLUS PO) Take 1 tablet by mouth daily.       Marland Kitchen loratadine (CLARITIN) 10 MG tablet Take 10 mg by mouth daily as needed for  allergies.      . Magnesium 250 MG TABS Take 1 tablet by mouth daily.      . Melatonin 3 MG TABS Take 6 mg by mouth at bedtime as needed (sleep).      Marland Kitchen PRESCRIPTION MEDICATION Apply topically 2 (two) times daily. Estradiol Progesterone cream 0.05-30 mg/ml      . Probiotic Product (PROBIOTIC PO) Take 2 capsules by mouth daily. Digestive Advantage      . RABEprazole (ACIPHEX) 20 MG tablet Take 20 mg by mouth daily.      . ranitidine (ZANTAC) 150 MG tablet Take 300 mg by mouth 2 (two) times daily.      . sertraline (ZOLOFT) 100 MG tablet Take 100 mg by mouth every morning.      . simvastatin (ZOCOR) 40 MG tablet Take 40 mg by mouth every evening.      . sucralfate (CARAFATE) 1 G tablet Take 1 g by mouth 4 (four) times daily as needed (acid reflux).      . triamterene-hydrochlorothiazide (DYAZIDE) 37.5-25 MG per capsule Take 1 capsule by mouth every morning.      . celecoxib  (CELEBREX) 200 MG capsule Take 200 mg by mouth daily.         No results found for this or any previous visit (from the past 48 hour(s)). No results found.  ROS see history of present illness  Blood pressure 137/76, temperature 97.9 F (36.6 C), temperature source Oral, resp. rate 19, height 5' 4.5" (1.638 m), weight 68.04 kg (150 lb), SpO2 94.00%. Physical Exam  Healthy-appearing slightly anxious female. Chest clear, heart normal, abdomen without guarding, mass effect, or tenderness Assessment/Plan Heme positive stool (heme-negative on repeat) with low ferritin but normal hemoglobin.  Plan is to proceed to both endoscopic and colonoscopic evaluation. She is agreeable.  Md Smola V 03/31/2014, 1:43 PM

## 2014-03-31 NOTE — Transfer of Care (Signed)
Immediate Anesthesia Transfer of Care Note  Patient: Shelby Vazquez  Procedure(s) Performed: Procedure(s): ESOPHAGOGASTRODUODENOSCOPY (EGD) (N/A) COLONOSCOPY WITH PROPOFOL (N/A)  Patient Location: PACU and Endoscopy Unit  Anesthesia Type:MAC  Level of Consciousness: awake, oriented, patient cooperative, lethargic and responds to stimulation  Airway & Oxygen Therapy: Patient Spontanous Breathing and Patient connected to nasal cannula oxygen  Post-op Assessment: Report given to PACU RN, Post -op Vital signs reviewed and stable and Patient moving all extremities  Post vital signs: Reviewed and stable  Complications: No apparent anesthesia complications

## 2014-03-31 NOTE — Op Note (Signed)
[facility] [place], [address]   ENDOSCOPY PROCEDURE REPORT  PATIENT: Shelby Vazquez, Shelby Vazquez.  MR#: [id number] BIRTHDATE: [birth year] , 63  yrs. old GENDER: Female ENDOSCOPIST:Benard Minturn [place], MD REFERRED BY:  Ria Bush PROCEDURE DATE:  03/31/2014 PROCEDURE:    upper endoscopy biopsy ASA CLASS: INDICATIONS:   heme positive stool. Nausea. MEDICATION:    MAC per anesthesia TOPICAL ANESTHETIC:  DESCRIPTION OF PROCEDURE:   the patient came as an outpatient to the [facility] long endoscopy unit, provided written consent, and was taken to the procedure room where she was sedated and time out was performed. The patient remained stable throughout the procedure.  The Pentax adult video endoscope was passed under direct vision. The larynx looked grossly normal. The esophagus was entered readily under direct vision and was normal in its entirety. No reflux esophagitis, Barrett's esophagus, varices, infection, or neoplasia were identified. A minimal hiatal hernia was present. No ring or stricture was seen.  The stomach was entered. It contained a minimal clear residual. A few mucosal hemorrhages were observed along the greater curve of the stomach but no gastritis, erosions, ulcers, or masses were observed.  However, the patient did have multiple medium and fairly large polyps, some of which were semi-pedunculated, along the greater curve of the stomach in the body and fundus. Retroflex view the cardia itself was unremarkable.  The pylorus, duodenal bulb, and second duodenum looked normal.  Several of the gastric polyps were biopsied prior to removal of the scope; they were felt compatible with fundic gland polyps.     COMPLICATIONS: None  ENDOSCOPIC IMPRESSION:  1. Mild mucosal hemorrhages in the stomach which could conceivably account for the patient's transiently heme-positive stool. No other source identified on this exam 2. Multiple gastric  polyps, presumably fundic gland polyps in a patient on chronic PPI therapy   RECOMMENDATIONS:  1. Await pathology and gastric polyps 2. Proceed to colonoscopic evaluation    _______________________________ eSigned:  Ronald Lobo, MD 03/31/2014 3:09 PM    PATIENT NAME:  [name] MR#: [id number]

## 2014-04-01 ENCOUNTER — Encounter (HOSPITAL_COMMUNITY): Payer: Self-pay | Admitting: Gastroenterology

## 2014-04-02 ENCOUNTER — Encounter: Payer: Self-pay | Admitting: Family Medicine

## 2014-04-04 NOTE — Anesthesia Postprocedure Evaluation (Signed)
Anesthesia Post Note  Patient: Shelby Vazquez  Procedure(s) Performed: Procedure(s) (LRB): ESOPHAGOGASTRODUODENOSCOPY (EGD) (N/A) COLONOSCOPY WITH PROPOFOL (N/A)  Anesthesia type: MAC  Patient location: PACU  Post pain: Pain level controlled  Post assessment: Post-op Vital signs reviewed  Last Vitals:  Filed Vitals:   03/31/14 1459  BP: 128/79  Pulse: 76  Temp: 36.7 C  Resp: 16    Post vital signs: Reviewed  Level of consciousness: sedated  Complications: No apparent anesthesia complications

## 2014-04-07 ENCOUNTER — Encounter: Payer: Self-pay | Admitting: Family Medicine

## 2014-04-12 ENCOUNTER — Encounter: Payer: Self-pay | Admitting: Gastroenterology

## 2014-04-18 ENCOUNTER — Encounter: Payer: Self-pay | Admitting: Family Medicine

## 2014-04-21 ENCOUNTER — Other Ambulatory Visit: Payer: Self-pay | Admitting: *Deleted

## 2014-04-21 MED ORDER — SIMVASTATIN 40 MG PO TABS: 40.0000 mg | ORAL_TABLET | Freq: Every evening | ORAL | Status: DC

## 2014-04-21 MED ORDER — TRIAMTERENE-HCTZ 37.5-25 MG PO CAPS: 1.0000 | ORAL_CAPSULE | Freq: Every morning | ORAL | Status: DC

## 2014-04-21 MED ORDER — CILIDINIUM-CHLORDIAZEPOXIDE 2.5-5 MG PO CAPS: 1.0000 | ORAL_CAPSULE | Freq: Two times a day (BID) | ORAL | Status: DC

## 2014-04-21 MED ORDER — SERTRALINE HCL 100 MG PO TABS: 100.0000 mg | ORAL_TABLET | Freq: Every morning | ORAL | Status: DC

## 2014-04-25 ENCOUNTER — Other Ambulatory Visit: Payer: Self-pay | Admitting: *Deleted

## 2014-04-25 NOTE — Telephone Encounter (Addendum)
Librax appears to have been printed last week, but not signed. In your IN box for signature. Please return to me to fax to [facility].

## 2014-04-25 NOTE — Telephone Encounter (Signed)
Rx faxed

## 2014-04-25 NOTE — Telephone Encounter (Signed)
Done

## 2014-05-18 ENCOUNTER — Other Ambulatory Visit: Payer: Self-pay | Admitting: Family Medicine

## 2014-05-18 MED ORDER — SIMVASTATIN 40 MG PO TABS: 40.0000 mg | ORAL_TABLET | Freq: Every evening | ORAL | Status: DC

## 2014-05-18 MED ORDER — CILIDINIUM-CHLORDIAZEPOXIDE 2.5-5 MG PO CAPS: 1.0000 | ORAL_CAPSULE | Freq: Two times a day (BID) | ORAL | Status: DC

## 2014-05-18 MED ORDER — SERTRALINE HCL 100 MG PO TABS: 100.0000 mg | ORAL_TABLET | Freq: Every morning | ORAL | Status: DC

## 2014-05-18 MED ORDER — TRIAMTERENE-HCTZ 37.5-25 MG PO CAPS: 1.0000 | ORAL_CAPSULE | Freq: Every morning | ORAL | Status: DC

## 2014-05-18 NOTE — Telephone Encounter (Signed)
Pt received a letter from [facility] saying that she needs hard copies of the above RXs faxed to them [phone]. Member ID [id number]

## 2014-05-18 NOTE — Telephone Encounter (Signed)
printed and placed in [facility]' box. 

## 2014-05-19 NOTE — Telephone Encounter (Signed)
Rx's faxed as directed.

## 2014-05-21 LAB — HM MAMMOGRAPHY: HM Mammogram: NORMAL

## 2014-08-01 ENCOUNTER — Other Ambulatory Visit: Payer: Self-pay | Admitting: Critical Care Medicine

## 2014-09-07 ENCOUNTER — Ambulatory Visit (INDEPENDENT_AMBULATORY_CARE_PROVIDER_SITE_OTHER): Payer: BC Managed Care – PPO | Admitting: Family Medicine

## 2014-09-07 ENCOUNTER — Encounter: Payer: Self-pay | Admitting: Family Medicine

## 2014-09-07 VITALS — BP 110/60 | HR 96 | Temp 98.0°F | Wt 148.5 lb

## 2014-09-07 DIAGNOSIS — R110 Nausea: Secondary | ICD-10-CM

## 2014-09-07 DIAGNOSIS — R1030 Lower abdominal pain, unspecified: Secondary | ICD-10-CM | POA: Insufficient documentation

## 2014-09-07 DIAGNOSIS — R109 Unspecified abdominal pain: Secondary | ICD-10-CM

## 2014-09-07 DIAGNOSIS — R1032 Left lower quadrant pain: Secondary | ICD-10-CM

## 2014-09-07 LAB — POCT URINALYSIS DIPSTICK
Bilirubin, UA: NEGATIVE
Glucose, UA: NEGATIVE
KETONES UA: NEGATIVE
Leukocytes, UA: NEGATIVE
NITRITE UA: NEGATIVE
PH UA: 6
PROTEIN UA: NEGATIVE
Spec Grav, UA: 1.015
Urobilinogen, UA: 0.2

## 2014-09-07 LAB — COMPREHENSIVE METABOLIC PANEL
ALBUMIN: 4.1 g/dL (ref 3.5–5.2)
ALT: 21 U/L (ref 0–35)
AST: 23 U/L (ref 0–37)
Alkaline Phosphatase: 49 U/L (ref 39–117)
BUN: 11 mg/dL (ref 6–23)
CALCIUM: 9.1 mg/dL (ref 8.4–10.5)
CO2: 27 mEq/L (ref 19–32)
Chloride: 99 mEq/L (ref 96–112)
Creatinine, Ser: 0.9 mg/dL (ref 0.4–1.2)
GFR: 67.94 mL/min (ref >60.00)
Glucose, Bld: 88 mg/dL (ref 70–99)
Potassium: 3.5 mEq/L (ref 3.5–5.1)
Sodium: 136 mEq/L (ref 135–145)
Total Bilirubin: 0.9 mg/dL (ref 0.2–1.2)
Total Protein: 6.9 g/dL (ref 6.0–8.3)

## 2014-09-07 LAB — CBC WITH DIFFERENTIAL/PLATELET
Basophils Absolute: 0 10*3/uL (ref 0.0–0.1)
Basophils Relative: 0.4 % (ref 0.0–3.0)
EOS ABS: 0 10*3/uL (ref 0.0–0.7)
Eosinophils Relative: 0.6 % (ref 0.0–5.0)
HEMATOCRIT: 38.8 % (ref 36.0–46.0)
HEMOGLOBIN: 12.9 g/dL (ref 12.0–15.0)
LYMPHS ABS: 2.4 10*3/uL (ref 0.7–4.0)
Lymphocytes Relative: 32.6 % (ref 12.0–46.0)
MCHC: 33.3 g/dL (ref 30.0–36.0)
MCV: 91.6 fl (ref 78.0–100.0)
Monocytes Absolute: 0.6 10*3/uL (ref 0.1–1.0)
Monocytes Relative: 7.9 % (ref 3.0–12.0)
NEUTROS ABS: 4.2 10*3/uL (ref 1.4–7.7)
Neutrophils Relative %: 58.5 % (ref 43.0–77.0)
Platelets: 296 10*3/uL (ref 150.0–400.0)
RBC: 4.23 Mil/uL (ref 3.87–5.11)
RDW: 13.7 % (ref 11.5–15.5)
WBC: 7.3 10*3/uL (ref 4.0–10.5)

## 2014-09-07 LAB — LIPASE: LIPASE: 28 U/L (ref 11.0–59.0)

## 2014-09-07 MED ORDER — CIPROFLOXACIN HCL 500 MG PO TABS: 500.0000 mg | ORAL_TABLET | Freq: Two times a day (BID) | ORAL | Status: DC

## 2014-09-07 MED ORDER — ONDANSETRON 4 MG PO TBDP
4.0000 mg | ORAL_TABLET | Freq: Once | ORAL | Status: AC
  Administered 2014-09-07: 4 mg via ORAL

## 2014-09-07 MED ORDER — METRONIDAZOLE 500 MG PO TABS: 500.0000 mg | ORAL_TABLET | Freq: Three times a day (TID) | ORAL | Status: DC

## 2014-09-07 NOTE — Patient Instructions (Addendum)
I am worried about diverticulitis. zofran for nausea provided today. Treat with cipro and flagyl antibiotics for 10 days. Clear liquid diet for next 2-3 days. If not improving as expected please let me know for CT scan. If worsening pain or nausea/fever, please seek urgent care.  Diverticulitis Diverticulitis is inflammation or infection of small pouches in your colon that form when you have a condition called diverticulosis. The pouches in your colon are called diverticula. Your colon, or large intestine, is where water is absorbed and stool is formed. Complications of diverticulitis can include:  Bleeding.  Severe infection.  Severe pain.  Perforation of your colon.  Obstruction of your colon. CAUSES  Diverticulitis is caused by bacteria. Diverticulitis happens when stool becomes trapped in diverticula. This allows bacteria to grow in the diverticula, which can lead to inflammation and infection. RISK FACTORS People with diverticulosis are at risk for diverticulitis. Eating a diet that does not include enough fiber from fruits and vegetables may make diverticulitis more likely to develop. SYMPTOMS  Symptoms of diverticulitis may include:  Abdominal pain and tenderness. The pain is normally located on the left side of the abdomen, but may occur in other areas.  Fever and chills.  Bloating.  Cramping.  Nausea.  Vomiting.  Constipation.  Diarrhea.  Blood in your stool. DIAGNOSIS  Your health care provider will ask you about your medical history and do a physical exam. You may need to have tests done because many medical conditions can cause the same symptoms as diverticulitis. Tests may include:  Blood tests.  Urine tests.  Imaging tests of the abdomen, including X-rays and CT scans. When your condition is under control, your health care provider may recommend that you have a colonoscopy. A colonoscopy can show how severe your diverticula are and whether something  else is causing your symptoms. TREATMENT  Most cases of diverticulitis are mild and can be treated at home. Treatment may include:  Taking over-the-counter pain medicines.  Following a clear liquid diet.  Taking antibiotic medicines by mouth for 7-10 days. More severe cases may be treated at a hospital. Treatment may include:  Not eating or drinking.  Taking prescription pain medicine.  Receiving antibiotic medicines through an IV tube.  Receiving fluids and nutrition through an IV tube.  Surgery. HOME CARE INSTRUCTIONS   Follow your health care provider's instructions carefully.  Follow a full liquid diet or other diet as directed by your health care provider. After your symptoms improve, your health care provider may tell you to change your diet. He or she may recommend you eat a high-fiber diet. Fruits and vegetables are good sources of fiber. Fiber makes it easier to pass stool.  Take fiber supplements or probiotics as directed by your health care provider.  Only take medicines as directed by your health care provider.  Keep all your follow-up appointments. SEEK MEDICAL CARE IF:   Your pain does not improve.  You have a hard time eating food.  Your bowel movements do not return to normal. SEEK IMMEDIATE MEDICAL CARE IF:   Your pain becomes worse.  Your symptoms do not get better.  Your symptoms suddenly get worse.  You have a fever.  You have repeated vomiting.  You have bloody or black, tarry stools. MAKE SURE YOU:   Understand these instructions.  Will watch your condition.  Will get help right away if you are not doing well or get worse. Document Released: 07/17/2005 Document Revised: 10/12/2013 Document Reviewed: 09/01/2013 Exit[employer]  Patient Information 2015 [employer]. This information is not intended to replace advice given to you by your health care provider. Make sure you discuss any questions you have with your health care provider.  

## 2014-09-07 NOTE — Assessment & Plan Note (Signed)
Concern for diverticulitis given LLQ pain and BM changes vs less likely constipation. Check CMP, lipase, CBC today. Treat with cipro/flagyl 10d course Offered CT abd/pelvis with contrast to further evaluate pain, pt requests trial of abx first. Discussed clear liquid diet for next 2-3 days. Red flags to seek care or to contact us discussed. Pt agrees with plan.

## 2014-09-07 NOTE — Progress Notes (Signed)
Pre visit review using our clinic review tool, if applicable. No additional management support is needed unless otherwise documented below in the visit note. 

## 2014-09-07 NOTE — Progress Notes (Signed)
BP 110/60 mmHg  Pulse 96  Temp(Src) 98 F (36.7 C) (Oral)  Wt 148 lb 8 oz (67.359 kg)   CC: L sided pain  Subjective:    Patient ID: Shelby Vazquez, female    DOB: 1951-01-18, 63 y.o.   MRN: [id number]  HPI: Shelby Vazquez is a 63 y.o. female presenting on 09/07/2014 for Flank Pain   2d h/o LLQ pain that radiated to back. Worse with standing. Heat relieved pain. + BM changes - constipation with diarrhea. Staying nauseated. Denies fevers/chills, vomiting, blood in stool.  Did notice more gassiness prior to pain - taking Gas X over last 2 weeks. No urinary symptoms. Denies new foods recently.   H/o this in past, dx as constipation. Has restarted miralax - that led to 8 loose stools yesterday with mild relief. Wears pessary. Avoiding NSAIDs 2/2 h/o gastritis.  COLONOSCOPY WITH PROPOFOL Laterality: N/A Date: 03/31/2014 sessile serrated adenomas, sigmoid diverticulosis, rpt 3 yrs (Cleotis Nipper, MD)  Relevant past medical, surgical, family and social history reviewed and updated as indicated.  Allergies and medications reviewed and updated. Current Outpatient Prescriptions on File Prior to Visit  Medication Sig  . calcium gluconate 500 MG tablet Take 2 tablets by mouth daily.  . celecoxib (CELEBREX) 200 MG capsule Take 200 mg by mouth daily.   . clidinium-chlordiazePOXIDE (LIBRAX) 5-2.5 MG per capsule Take 1 capsule by mouth 2 (two) times daily.  . Cyanocobalamin (VITAMIN B-12 IJ) Inject as directed every 30 (thirty) days.  . famotidine (PEPCID) 40 MG tablet take 2 tablets by mouth at bedtime  . fluticasone (FLONASE) 50 MCG/ACT nasal spray Place 2 sprays into both nostrils daily as needed.   . Iron-FA-B Cmp-C-Biot-Probiotic (FUSION PLUS PO) Take 1 tablet by mouth daily.   Marland Kitchen loratadine (CLARITIN) 10 MG tablet Take 10 mg by mouth daily as needed for allergies.  . Magnesium 250 MG TABS Take 1 tablet by mouth daily.  . Melatonin 3 MG TABS Take 6 mg by mouth at bedtime as needed  (sleep).  Marland Kitchen PRESCRIPTION MEDICATION Apply topically 2 (two) times daily. Estradiol Progesterone cream 0.05-30 mg/ml  . Probiotic Product (PROBIOTIC PO) Take 2 capsules by mouth daily. Digestive Advantage  . RABEprazole (ACIPHEX) 20 MG tablet Take 20 mg by mouth daily.  . ranitidine (ZANTAC) 150 MG tablet Take 300 mg by mouth 2 (two) times daily.  . sertraline (ZOLOFT) 100 MG tablet Take 1 tablet (100 mg total) by mouth every morning.  . simvastatin (ZOCOR) 40 MG tablet Take 1 tablet (40 mg total) by mouth every evening.  . sucralfate (CARAFATE) 1 G tablet Take 1 g by mouth 4 (four) times daily as needed (acid reflux).  . triamterene-hydrochlorothiazide (DYAZIDE) 37.5-25 MG per capsule Take 1 each (1 capsule total) by mouth every morning.   No current facility-administered medications on file prior to visit.   Past Medical History  Diagnosis Date  . Chronic anxiety   . B12 deficiency   . IBS (irritable bowel syndrome)   . Osteopenia   . Uterine prolapse   . Hyperlipidemia   . Vocal cord paralysis, bilateral partial 2001    evaluated at [facility] - did not require surgery  . 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 Cough   . GERD (gastroesophageal reflux disease)     severe leading to chronic cough and hoarseness  . Hypertension   . Complication of anesthesia 9 yrs ago  awake and in severe pain with colonoscopy    Past Surgical History  Procedure Laterality Date  . Cataract extraction      right 01/2010, left may 2011  . Bunionectomy      [place] '2000  . Dilation and curettage of uterus  '77    for molar pregnancy excision  . Esophagogastroduodenoscopy endoscopy  april 2014  . Esophagogastroduodenoscopy N/A 03/31/2014    mult gastric polyps, mild mucosal hemorrhages; ESOPHAGOGASTRODUODENOSCOPY (EGD);  Surgeon: Cleotis Nipper, MD  . Colonoscopy with propofol N/A 03/31/2014    sessile serrated adenomas, sigmoid  diverticulosis, rpt 3 yrs (Cleotis Nipper, MD)   Review of Systems Per HPI unless specifically indicated above    Objective:    BP 110/60 mmHg  Pulse 96  Temp(Src) 98 F (36.7 C) (Oral)  Wt 148 lb 8 oz (67.359 kg)  Physical Exam  Constitutional: She appears well-developed and well-nourished. No distress.  Tired appearing but nontoxic  Cardiovascular: Normal rate, regular rhythm, normal heart sounds and intact distal pulses.   No murmur heard. Pulmonary/Chest: Effort normal and breath sounds normal. No respiratory distress. She has no wheezes. She has no rales.  Abdominal: Soft. Normal appearance and bowel sounds are normal. She exhibits no distension and no mass. There is no hepatosplenomegaly. There is tenderness (moderate) in the periumbilical area, suprapubic area and left lower quadrant. There is guarding. There is no rigidity, no rebound, no CVA tenderness and negative Murphy's sign.  Musculoskeletal: She exhibits no edema.  Nursing note and vitals reviewed.      Assessment & Plan:   Problem List Items Addressed This Visit    LLQ abdominal pain - Primary    Concern for diverticulitis given LLQ pain and BM changes vs less likely constipation. Check CMP, lipase, CBC today. Treat with cipro/flagyl 10d course Offered CT abd/pelvis with contrast to further evaluate pain, pt requests trial of abx first. Discussed clear liquid diet for next 2-3 days. Red flags to seek care or to contact us discussed. Pt agrees with plan.    Relevant Orders      CBC with Differential      Comprehensive metabolic panel      Lipase    Other Visit Diagnoses    Left flank pain        Relevant Orders       POCT Urinalysis Dipstick (Completed)    Nausea without vomiting        Relevant Medications       ondansetron (ZOFRAN-ODT) disintegrating tablet 4 mg (Completed) (Start on 09/07/2014 10:15 AM)        Follow up plan: No Follow-up on file.

## 2014-09-29 ENCOUNTER — Other Ambulatory Visit: Payer: Self-pay | Admitting: Family Medicine

## 2014-09-29 MED ORDER — CILIDINIUM-CHLORDIAZEPOXIDE 2.5-5 MG PO CAPS: 1.0000 | ORAL_CAPSULE | Freq: Two times a day (BID) | ORAL | Status: DC

## 2014-09-29 NOTE — Telephone Encounter (Signed)
Needs to be written Rx and faxed to mail order. Printed for signature.

## 2014-09-29 NOTE — Telephone Encounter (Signed)
plz phone in. 

## 2014-10-17 ENCOUNTER — Telehealth: Payer: Self-pay | Admitting: *Deleted

## 2014-10-17 NOTE — Telephone Encounter (Signed)
Pt states she has 63 year old orthotics and would like to order a new pair.

## 2014-10-21 DIAGNOSIS — I700 Atherosclerosis of aorta: Secondary | ICD-10-CM | POA: Insufficient documentation

## 2014-10-21 HISTORY — DX: Atherosclerosis of aorta: I70.0

## 2014-10-26 NOTE — Telephone Encounter (Signed)
Spoke with patient due to time between visits scheduled an appointment to see Dr Paulla Dolly

## 2014-10-27 ENCOUNTER — Ambulatory Visit: Payer: Self-pay | Admitting: Podiatry

## 2014-10-31 ENCOUNTER — Ambulatory Visit: Payer: Self-pay | Admitting: Podiatry

## 2014-11-02 ENCOUNTER — Encounter: Payer: Self-pay | Admitting: Podiatry

## 2014-11-02 ENCOUNTER — Ambulatory Visit (INDEPENDENT_AMBULATORY_CARE_PROVIDER_SITE_OTHER): Payer: BC Managed Care – PPO | Admitting: Podiatry

## 2014-11-02 VITALS — BP 112/66 | HR 74 | Resp 16

## 2014-11-02 DIAGNOSIS — M779 Enthesopathy, unspecified: Secondary | ICD-10-CM

## 2014-11-02 NOTE — Progress Notes (Signed)
Subjective:     Patient ID: Shelby Vazquez, female   DOB: October 11, 1951, 64 y.o.   MRN: [id number]  HPI patient presents stating I need new orthotics as I do get pain in my feet and the old orthotics are not good for all types of shoes   Review of Systems  All other systems reviewed and are negative.      Objective:   Physical Exam Neurovascular status intact with muscle strength adequate range of motion within normal limits. Mild plantar arch pain bilateral with moderate depression of the arch upon weightbearing and no other pathology was noted    Assessment:     Inflammatory pain bilateral arch secondary to foot structure    Plan:     Reviewed condition and recommended new orthotics and scanned for customized orthotics to reduce stress. Reappoint for [place] to recheck orthotics are here

## 2014-11-02 NOTE — Progress Notes (Signed)
   Subjective:    Patient ID: Shelby Vazquez, female    DOB: 1951/07/13, 64 y.o.   MRN: [id number]  HPI  Pt presents requesting new orthotics, no other issues or pain at this time  Review of Systems  All other systems reviewed and are negative.      Objective:   Physical Exam        Assessment & Plan:

## 2014-11-03 ENCOUNTER — Ambulatory Visit (INDEPENDENT_AMBULATORY_CARE_PROVIDER_SITE_OTHER): Payer: BC Managed Care – PPO | Admitting: Family Medicine

## 2014-11-03 ENCOUNTER — Ambulatory Visit (INDEPENDENT_AMBULATORY_CARE_PROVIDER_SITE_OTHER)
Admission: RE | Admit: 2014-11-03 | Discharge: 2014-11-03 | Disposition: A | Discharge status: Home / Self Care | Payer: BC Managed Care – PPO | Source: Ambulatory Visit | Attending: Family Medicine | Admitting: Family Medicine

## 2014-11-03 ENCOUNTER — Encounter: Payer: Self-pay | Admitting: Family Medicine

## 2014-11-03 VITALS — BP 144/90 | HR 88 | Temp 98.1°F | Wt 149.5 lb

## 2014-11-03 DIAGNOSIS — R1030 Lower abdominal pain, unspecified: Secondary | ICD-10-CM

## 2014-11-03 MED ORDER — PROMETHAZINE HCL 25 MG PO TABS: 25.0000 mg | ORAL_TABLET | Freq: Three times a day (TID) | ORAL | Status: DC | PRN

## 2014-11-03 NOTE — Patient Instructions (Addendum)
Blood work and Insurance account manager today. We will order CT scan for tomorrow. May use xanax. Bland diet over next 2-3 days.  If worsening pain again, please seek urgent care at ER. Phenergan 25mg  for nausea as needed.

## 2014-11-03 NOTE — Assessment & Plan Note (Signed)
Progressively worsening episodes of lower abdominal pain/cramping associated with BM changes (diarrhea then constipation), and vomiting. Now no stool or flatus today - but non acute abdomen on exam. Discussed concern with developing obstruction vs diverticulitis vs other intra abdominal pathology. Acute abd series today - does not seem to have obstructed bowel gas pattern. Recent colonoscopy reassuring. Check CMP, CBC and lipase today. Will also order stat CT scan for tomorrow to evaluate intra abd pathology like diverticulitis, SBO. Pt agrees with plan. Prescribed phenergan 25mg  prn nausea In interim, discussed red flags to seek ER care. Pt agrees.

## 2014-11-03 NOTE — Progress Notes (Signed)
BP 144/90 mmHg  Pulse 88  Temp(Src) 98.1 F (36.7 C) (Oral)  Wt 149 lb 8 oz (67.813 kg)   CC: worsening abd pain Subjective:    Patient ID: Shelby Vazquez, female    DOB: [birth year], 64 y.o.   MRN: [id number]  HPI: Shelby Vazquez is a 64 y.o. female presenting on 11/03/2014 for Irritable Bowel Syndrome   Last night worsening abdominal pain with bloating and cramping and nausea "my usual IBS" but much more severe. +large vomiting x1 (new). BM changes recently. Used suppository for mild constipation. Waves of severe pain. Almost went to ER. Chills but afebrile during this. No blood in stool. 4d prior to this had 12 episodes of diarrhea. Has changed to bland diet. BM changes - yesterday morning 2 small hard stools, last night hard stools then diarrhea after suppository. Not passing gas. Today no stool. Denies urinary sxs.  Seen here 08/2014 with presumed dx diverticulitis and treated with 10d flagyl/cipro course which resolved pain. Pt declined CT scan at that time.   H/o IBS, anxiety, severe GERD. States has flares like this once a week. GI has not found a cause.  Wears pessary. Avoids NSAIDs 2/2 h/o gastritis.  ESOPHAGOGASTRODUODENOSCOPY Date: 03/31/2014 mult gastric polyps, mild mucosal hemorrhages;   COLONOSCOPY WITH PROPOFOL Date: 03/31/2014 sessile serrated adenomas, sigmoid diverticulosis, rpt 3 yrs (Cleotis Nipper, MD)  Relevant past medical, surgical, family and social history reviewed and updated as indicated. Interim medical history since our last visit reviewed. Allergies and medications reviewed and updated. Current Outpatient Prescriptions on File Prior to Visit  Medication Sig  . acetaminophen (TYLENOL) 500 MG tablet Take 500 mg by mouth as needed.  . calcium gluconate 500 MG tablet Take 2 tablets by mouth daily.  . clidinium-chlordiazePOXIDE (LIBRAX) 5-2.5 MG per capsule Take 1 capsule by mouth 2 (two) times daily.  . Cyanocobalamin (VITAMIN B-12 IJ) Inject as directed  every 30 (thirty) days.  . famotidine (PEPCID) 40 MG tablet take 2 tablets by mouth at bedtime  . fluticasone (FLONASE) 50 MCG/ACT nasal spray Place 2 sprays into both nostrils daily as needed.   . Iron-FA-B Cmp-C-Biot-Probiotic (FUSION PLUS PO) Take 1 tablet by mouth daily.   Marland Kitchen loratadine (CLARITIN) 10 MG tablet Take 10 mg by mouth daily as needed for allergies.  . Magnesium 250 MG TABS Take 1 tablet by mouth daily.  . Melatonin 3 MG TABS Take 6 mg by mouth at bedtime as needed (sleep).  . Polyethylene Glycol 3350 (MIRALAX PO) Take 17 g by mouth daily as needed (constipation (in 8oz fluid)).   Marland Kitchen PRESCRIPTION MEDICATION Apply topically 2 (two) times daily. Estradiol Progesterone cream 0.05-30 mg/ml  . Probiotic Product (PROBIOTIC PO) Take 2 capsules by mouth daily. Digestive Advantage  . RABEprazole (ACIPHEX) 20 MG tablet Take 20 mg by mouth daily.  . ranitidine (ZANTAC) 150 MG tablet Take 300 mg by mouth 2 (two) times daily.  . sertraline (ZOLOFT) 100 MG tablet Take 1 tablet (100 mg total) by mouth every morning.  . simvastatin (ZOCOR) 40 MG tablet Take 1 tablet (40 mg total) by mouth every evening.  . sucralfate (CARAFATE) 1 G tablet Take 1 g by mouth 4 (four) times daily as needed (acid reflux).  . triamterene-hydrochlorothiazide (DYAZIDE) 37.5-25 MG per capsule Take 1 each (1 capsule total) by mouth every morning.   No current facility-administered medications on file prior to visit.    Review of Systems Per HPI unless specifically indicated above  Objective:    BP 144/90 mmHg  Pulse 88  Temp(Src) 98.1 F (36.7 C) (Oral)  Wt 149 lb 8 oz (67.813 kg)  Wt Readings from Last 3 Encounters:  11/03/14 149 lb 8 oz (67.813 kg)  09/07/14 148 lb 8 oz (67.359 kg)  03/31/14 150 lb (68.04 kg)    Physical Exam  Constitutional: She appears well-developed and well-nourished. No distress.  HENT:  Mouth/Throat: Oropharynx is clear and moist. No oropharyngeal exudate.  Eyes: Conjunctivae  and EOM are normal. Pupils are equal, round, and reactive to light. No scleral icterus.  Neck: Normal range of motion. Neck supple.  Cardiovascular: Normal rate, regular rhythm, normal heart sounds and intact distal pulses.   No murmur heard. Pulmonary/Chest: Effort normal and breath sounds normal. No respiratory distress. She has no wheezes. She has no rales.  Abdominal: Soft. Normal appearance. She exhibits no distension and no mass. Bowel sounds are increased. There is no hepatosplenomegaly. There is tenderness (mild-moderate) in the right lower quadrant, suprapubic area and left lower quadrant. There is no rigidity, no rebound, no guarding, no CVA tenderness and negative Murphy's sign.  Musculoskeletal: She exhibits no edema.  Skin: Skin is warm and dry.  Nursing note and vitals reviewed.  Results for orders placed or performed in visit on 09/07/14  CBC with Differential  Result Value Ref Range   WBC 7.3 4.0 - 10.5 K/uL   RBC 4.23 3.87 - 5.11 Mil/uL   Hemoglobin 12.9 12.0 - 15.0 g/dL   HCT 38.8 36.0 - 46.0 %   MCV 91.6 78.0 - 100.0 fl   MCHC 33.3 30.0 - 36.0 g/dL   RDW 13.7 11.5 - 15.5 %   Platelets 296.0 150.0 - 400.0 K/uL   Neutrophils Relative % 58.5 43.0 - 77.0 %   Lymphocytes Relative 32.6 12.0 - 46.0 %   Monocytes Relative 7.9 3.0 - 12.0 %   Eosinophils Relative 0.6 0.0 - 5.0 %   Basophils Relative 0.4 0.0 - 3.0 %   Neutro Abs 4.2 1.4 - 7.7 K/uL   Lymphs Abs 2.4 0.7 - 4.0 K/uL   Monocytes Absolute 0.6 0.1 - 1.0 K/uL   Eosinophils Absolute 0.0 0.0 - 0.7 K/uL   Basophils Absolute 0.0 0.0 - 0.1 K/uL  Comprehensive metabolic panel  Result Value Ref Range   Sodium 136 135 - 145 mEq/L   Potassium 3.5 3.5 - 5.1 mEq/L   Chloride 99 96 - 112 mEq/L   CO2 27 19 - 32 mEq/L   Glucose, Bld 88 70 - 99 mg/dL   BUN 11 6 - 23 mg/dL   Creatinine, Ser 0.9 0.4 - 1.2 mg/dL   Total Bilirubin 0.9 0.2 - 1.2 mg/dL   Alkaline Phosphatase 49 39 - 117 U/L   AST 23 0 - 37 U/L   ALT 21 0 - 35  U/L   Total Protein 6.9 6.0 - 8.3 g/dL   Albumin 4.1 3.5 - 5.2 g/dL   Calcium 9.1 8.4 - 10.5 mg/dL   GFR 67.94 >60.00 mL/min  Lipase  Result Value Ref Range   Lipase 28.0 11.0 - 59.0 U/L  POCT Urinalysis Dipstick  Result Value Ref Range   Color, UA Yellow    Clarity, UA Hazy    Glucose, UA Negative    Bilirubin, UA Negative    Ketones, UA Negative    Spec Grav, UA 1.015    Blood, UA 1+    pH, UA 6.0    Protein, UA Negative  Urobilinogen, UA 0.2    Nitrite, UA Negative    Leukocytes, UA Negative       Assessment & Plan:   Problem List Items Addressed This Visit    Lower abdominal pain - Primary    Progressively worsening episodes of lower abdominal pain/cramping associated with BM changes (diarrhea then constipation), and vomiting. Now no stool or flatus today - but non acute abdomen on exam. Discussed concern with developing obstruction vs diverticulitis vs other intra abdominal pathology. Acute abd series today - does not seem to have obstructed bowel gas pattern. Recent colonoscopy reassuring. Check CMP, CBC and lipase today. Will also order stat CT scan for tomorrow to evaluate intra abd pathology like diverticulitis, SBO. Pt agrees with plan. Prescribed phenergan 25mg  prn nausea In interim, discussed red flags to seek ER care. Pt agrees.      Relevant Orders   Comprehensive metabolic panel   CBC with Differential   Lipase   DG Abd Acute W/Chest   CT Abdomen Pelvis W Contrast       Follow up plan: Return if symptoms worsen or fail to improve.

## 2014-11-03 NOTE — Progress Notes (Signed)
Pre visit review using our clinic review tool, if applicable. No additional management support is needed unless otherwise documented below in the visit note. 

## 2014-11-04 ENCOUNTER — Ambulatory Visit (INDEPENDENT_AMBULATORY_CARE_PROVIDER_SITE_OTHER)
Admission: RE | Admit: 2014-11-04 | Discharge: 2014-11-04 | Disposition: A | Discharge status: Home / Self Care | Payer: BC Managed Care – PPO | Source: Ambulatory Visit | Attending: Family Medicine | Admitting: Family Medicine

## 2014-11-04 ENCOUNTER — Telehealth: Payer: Self-pay | Admitting: *Deleted

## 2014-11-04 DIAGNOSIS — R1030 Lower abdominal pain, unspecified: Secondary | ICD-10-CM

## 2014-11-04 LAB — COMPREHENSIVE METABOLIC PANEL
ALT: 23 U/L (ref 0–35)
AST: 29 U/L (ref 0–37)
Albumin: 4.7 g/dL (ref 3.5–5.2)
Alkaline Phosphatase: 51 U/L (ref 39–117)
BILIRUBIN TOTAL: 0.6 mg/dL (ref 0.2–1.2)
BUN: 15 mg/dL (ref 6–23)
CALCIUM: 10.2 mg/dL (ref 8.4–10.5)
CHLORIDE: 99 meq/L (ref 96–112)
CO2: 29 mEq/L (ref 19–32)
Creatinine, Ser: 0.97 mg/dL (ref 0.40–1.20)
GFR: 61.48 mL/min (ref >60.00)
Glucose, Bld: 101 mg/dL — ABNORMAL HIGH (ref 70–99)
Potassium: 4.3 mEq/L (ref 3.5–5.1)
Sodium: 138 mEq/L (ref 135–145)
Total Protein: 7.3 g/dL (ref 6.0–8.3)

## 2014-11-04 LAB — LIPASE: LIPASE: 22 U/L (ref 11.0–59.0)

## 2014-11-04 LAB — CBC WITH DIFFERENTIAL/PLATELET
Basophils Absolute: 0.1 10*3/uL (ref 0.0–0.1)
Basophils Relative: 0.7 % (ref 0.0–3.0)
Eosinophils Absolute: 0.1 10*3/uL (ref 0.0–0.7)
Eosinophils Relative: 1.1 % (ref 0.0–5.0)
HEMATOCRIT: 43.9 % (ref 36.0–46.0)
HEMOGLOBIN: 14.3 g/dL (ref 12.0–15.0)
Lymphocytes Relative: 35.9 % (ref 12.0–46.0)
Lymphs Abs: 3.2 10*3/uL (ref 0.7–4.0)
MCHC: 32.5 g/dL (ref 30.0–36.0)
MCV: 93.8 fl (ref 78.0–100.0)
Monocytes Absolute: 0.5 10*3/uL (ref 0.1–1.0)
Monocytes Relative: 5.5 % (ref 3.0–12.0)
Neutro Abs: 5 10*3/uL (ref 1.4–7.7)
Neutrophils Relative %: 56.8 % (ref 43.0–77.0)
Platelets: 324 10*3/uL (ref 150.0–400.0)
RBC: 4.68 Mil/uL (ref 3.87–5.11)
RDW: 13.7 % (ref 11.5–15.5)
WBC: 8.8 10*3/uL (ref 4.0–10.5)

## 2014-11-04 MED ORDER — IOHEXOL 300 MG/ML  SOLN
100.0000 mL | Freq: Once | INTRAMUSCULAR | Status: AC | PRN
  Administered 2014-11-04: 100 mL via INTRAVENOUS

## 2014-11-04 NOTE — Telephone Encounter (Signed)
Patient notified and verbalized understanding. She said she is feeling a little better, but still has not had a BM and has not passed any gas. She will make sure to take the librax BID and was asking if there was anything she should be doing differently for the aortic plaque.

## 2014-11-04 NOTE — Telephone Encounter (Signed)
Shelby Vazquez from [facility] called and said scan was essentially negative. I told her to let patient leave and we would call her with results.

## 2014-11-04 NOTE — Telephone Encounter (Signed)
plz notify - CT scan showed some plaque build up in the aorta but otherwise bowels looked normal. She did have diverticulosis but no active inflammation or diverticulitis. plz ensure now passing stools and gas and pain improving plz ensure she's taking librax scheduled twice daily if not would suggest trial of this.

## 2014-11-05 ENCOUNTER — Encounter: Payer: Self-pay | Admitting: Family Medicine

## 2014-11-05 NOTE — Telephone Encounter (Signed)
Released results via mychart and addressed plaque buildup.

## 2014-11-22 ENCOUNTER — Ambulatory Visit: Payer: BC Managed Care – PPO | Admitting: *Deleted

## 2014-11-22 DIAGNOSIS — M779 Enthesopathy, unspecified: Secondary | ICD-10-CM

## 2014-11-22 NOTE — Progress Notes (Signed)
PICKING UP MY INSERTS

## 2014-11-22 NOTE — Patient Instructions (Signed)

## 2014-12-21 ENCOUNTER — Other Ambulatory Visit: Payer: Self-pay | Admitting: Critical Care Medicine

## 2015-01-10 DIAGNOSIS — M79673 Pain in unspecified foot: Secondary | ICD-10-CM

## 2015-01-18 ENCOUNTER — Telehealth: Payer: Self-pay | Admitting: *Deleted

## 2015-01-18 NOTE — Telephone Encounter (Signed)
Patient dropped off and paid to have older orthotics refurbished

## 2015-01-20 DIAGNOSIS — M8580 Other specified disorders of bone density and structure, unspecified site: Secondary | ICD-10-CM

## 2015-01-20 HISTORY — DX: Other specified disorders of bone density and structure, unspecified site: M85.80

## 2015-01-24 ENCOUNTER — Other Ambulatory Visit: Payer: Self-pay | Admitting: Critical Care Medicine

## 2015-02-03 ENCOUNTER — Ambulatory Visit (INDEPENDENT_AMBULATORY_CARE_PROVIDER_SITE_OTHER): Payer: BC Managed Care – PPO | Admitting: Family Medicine

## 2015-02-03 ENCOUNTER — Encounter: Payer: Self-pay | Admitting: Family Medicine

## 2015-02-03 VITALS — BP 112/72 | HR 86 | Temp 97.6°F | Wt 147.2 lb

## 2015-02-03 DIAGNOSIS — K589 Irritable bowel syndrome without diarrhea: Secondary | ICD-10-CM

## 2015-02-03 DIAGNOSIS — I700 Atherosclerosis of aorta: Secondary | ICD-10-CM | POA: Diagnosis not present

## 2015-02-03 DIAGNOSIS — R1030 Lower abdominal pain, unspecified: Secondary | ICD-10-CM

## 2015-02-03 LAB — TSH: TSH: 2.05 u[IU]/mL (ref 0.35–4.50)

## 2015-02-03 MED ORDER — CILIDINIUM-CHLORDIAZEPOXIDE 2.5-5 MG PO CAPS: 1.0000 | ORAL_CAPSULE | Freq: Three times a day (TID) | ORAL | Status: DC

## 2015-02-03 MED ORDER — VENLAFAXINE HCL ER 37.5 MG PO CP24: 37.5000 mg | ORAL_CAPSULE | Freq: Every day | ORAL | Status: DC

## 2015-02-03 NOTE — Patient Instructions (Signed)
Blood work today. Increase librax to three times daily. Decrease sertraline to 50mg  daily and add effexor XR 1 tablet at night time. After 1-2 weeks call me with update and we will discuss further titration. If not improving with this, may consider CT angiogram of bowels to check for plaque buildup in small arteries.

## 2015-02-03 NOTE — Assessment & Plan Note (Signed)
Persistent trouble, working with nutritionist but has not been able to find food to cause these symptoms. GI workup unrevealing. Known IBS - will increase librax to TID with meals and add effexor 37.5mg  nightly (and slowly taper down on zoloft to 50mg  daily in am over next 1-2 wks).  Scattered atheroscloerotic plaque in abd aorta on recent imaging - ?mesenteric ischemia - check lactic acid today. If persistent pain despite above, discussed CTA bowels to further eval.  Pt agrees with plan. Has f/u on Monday for CPE.

## 2015-02-03 NOTE — Progress Notes (Signed)
BP 112/72 mmHg  Pulse 86  Temp(Src) 97.6 F (36.4 C) (Oral)  Wt 147 lb 4 oz (66.792 kg)  SpO2 98%   CC: abd pain  Subjective:    Patient ID: Shelby Vazquez, female    DOB: 1950/12/22, 64 y.o.   MRN: [id number]  HPI: Shelby Vazquez is a 64 y.o. female presenting on 02/03/2015 for Abdominal Pain and Diarrhea   Seen 10/2014 with lower abd pain, workup including labwork, abd series and CT scan unrevealing. sxs never really improved.   Saw nutritionist. Changed diet to FODMAP diet. Hasn't noted significant improvement. Find ssweet potatoes caused severe attacks.   Bad week. Monday night started having symptoms again. Increased abdominal pain, nausea/vomiting, bloating, fatigue, and diarrhea. Sxs ongoing. Denies urinary symptoms, fevers/chills.  L earache started last night. Better today.   Compliant with pepcid 80mg  nightly and zantac for breakthrough sxs, aciphex 20mg  daily. Not currently using carafate.   H/o IBS, anxiety, severe GERD. States has flares like this once a week. GI has not found a cause.  Avoids NSAIDs 2/2 h/o gastritis. Wears pessary.  ESOPHAGOGASTRODUODENOSCOPY Date: 03/31/2014 mult gastric polyps, mild mucosal hemorrhages;   COLONOSCOPY WITH PROPOFOL Date: 03/31/2014 sessile serrated adenomas, sigmoid diverticulosis, rpt 3 yrs (Cleotis Nipper, MD)  Relevant past medical, surgical, family and social history reviewed and updated as indicated. Interim medical history since our last visit reviewed. Allergies and medications reviewed and updated. Current Outpatient Prescriptions on File Prior to Visit  Medication Sig  . acetaminophen (TYLENOL) 500 MG tablet Take 500 mg by mouth as needed.  . Cyanocobalamin (VITAMIN B-12 IJ) Inject as directed every 30 (thirty) days.  . famotidine (PEPCID) 40 MG tablet take 2 tablets by mouth at bedtime  . Iron-FA-B Cmp-C-Biot-Probiotic (FUSION PLUS PO) Take 1 tablet by mouth daily.   Marland Kitchen loratadine (CLARITIN) 10 MG tablet Take 10 mg  by mouth daily as needed for allergies.  . Polyethylene Glycol 3350 (MIRALAX PO) Take 17 g by mouth daily as needed (constipation (in 8oz fluid)).   Marland Kitchen PRESCRIPTION MEDICATION Apply topically 2 (two) times daily. Estradiol Progesterone cream 0.05-30 mg/ml  . promethazine (PHENERGAN) 25 MG tablet Take 1 tablet (25 mg total) by mouth every 8 (eight) hours as needed for nausea or vomiting.  . RABEprazole (ACIPHEX) 20 MG tablet Take 20 mg by mouth daily.  . ranitidine (ZANTAC) 150 MG tablet Take 300 mg by mouth 2 (two) times daily.  . sertraline (ZOLOFT) 100 MG tablet Take 1 tablet (100 mg total) by mouth every morning. (Patient taking differently: Take 50 mg by mouth every morning. )  . simvastatin (ZOCOR) 40 MG tablet Take 1 tablet (40 mg total) by mouth every evening.  . triamterene-hydrochlorothiazide (DYAZIDE) 37.5-25 MG per capsule Take 1 each (1 capsule total) by mouth every morning.   No current facility-administered medications on file prior to visit.    Review of Systems Per HPI unless specifically indicated above     Objective:    BP 112/72 mmHg  Pulse 86  Temp(Src) 97.6 F (36.4 C) (Oral)  Wt 147 lb 4 oz (66.792 kg)  SpO2 98%  Wt Readings from Last 3 Encounters:  02/03/15 147 lb 4 oz (66.792 kg)  11/03/14 149 lb 8 oz (67.813 kg)  09/07/14 148 lb 8 oz (67.359 kg)    Physical Exam  Constitutional: She appears well-developed and well-nourished. No distress.  Abdominal: Soft. Normal appearance and bowel sounds are normal. She exhibits no distension and no mass.  There is no hepatosplenomegaly. There is tenderness in the epigastric area, suprapubic area and left lower quadrant. There is no rigidity, no rebound, no guarding, no CVA tenderness and negative Murphy's sign.  Psychiatric:  In tears discussing limitations of GI issues on life  Nursing note and vitals reviewed.  Results for orders placed or performed in visit on 11/03/14  Comprehensive metabolic panel  Result Value  Ref Range   Sodium 138 135 - 145 mEq/L   Potassium 4.3 3.5 - 5.1 mEq/L   Chloride 99 96 - 112 mEq/L   CO2 29 19 - 32 mEq/L   Glucose, Bld 101 (H) 70 - 99 mg/dL   BUN 15 6 - 23 mg/dL   Creatinine, Ser 0.97 0.40 - 1.20 mg/dL   Total Bilirubin 0.6 0.2 - 1.2 mg/dL   Alkaline Phosphatase 51 39 - 117 U/L   AST 29 0 - 37 U/L   ALT 23 0 - 35 U/L   Total Protein 7.3 6.0 - 8.3 g/dL   Albumin 4.7 3.5 - 5.2 g/dL   Calcium 10.2 8.4 - 10.5 mg/dL   GFR 61.48 >60.00 mL/min  CBC with Differential  Result Value Ref Range   WBC 8.8 4.0 - 10.5 K/uL   RBC 4.68 3.87 - 5.11 Mil/uL   Hemoglobin 14.3 12.0 - 15.0 g/dL   HCT 43.9 36.0 - 46.0 %   MCV 93.8 78.0 - 100.0 fl   MCHC 32.5 30.0 - 36.0 g/dL   RDW 13.7 11.5 - 15.5 %   Platelets 324.0 150.0 - 400.0 K/uL   Neutrophils Relative % 56.8 43.0 - 77.0 %   Lymphocytes Relative 35.9 12.0 - 46.0 %   Monocytes Relative 5.5 3.0 - 12.0 %   Eosinophils Relative 1.1 0.0 - 5.0 %   Basophils Relative 0.7 0.0 - 3.0 %   Neutro Abs 5.0 1.4 - 7.7 K/uL   Lymphs Abs 3.2 0.7 - 4.0 K/uL   Monocytes Absolute 0.5 0.1 - 1.0 K/uL   Eosinophils Absolute 0.1 0.0 - 0.7 K/uL   Basophils Absolute 0.1 0.0 - 0.1 K/uL  Lipase  Result Value Ref Range   Lipase 22.0 11.0 - 59.0 U/L   CT ABDOMEN AND PELVIS WITH CONTRAST TECHNIQUE: Multidetector CT imaging of the abdomen and pelvis was performed using the standard protocol following bolus administration of intravenous contrast.  CONTRAST: 153mL OMNIPAQUE IOHEXOL 300 MG/ML SOLN  COMPARISON: 08/16/2005  FINDINGS: Lower chest: The lung bases are clear of acute process. No pleural effusion or pulmonary lesions. The heart is normal in size. No pericardial effusion. The distal esophagus and aorta are unremarkable.  Hepatobiliary: No focal hepatic lesions or intrahepatic biliary dilatation. The gallbladder is normal. No common bile duct dilatation.  Pancreas: No inflammatory changes or mass lesions. The pancreatic duct is  normal in caliber.  Spleen: Normal size. No focal lesions.  Adrenals/Urinary Tract: The adrenal glands are normal. The kidneys are normal.  Stomach/Bowel: The stomach, duodenum, small bowel and colon are unremarkable. No inflammatory changes, mass lesions or obstructive findings. The terminal ileum is normal. The appendix is normal. Scattered sigmoid diverticulosis without findings for acute diverticulitis.  Vascular/Lymphatic: No mesenteric or retroperitoneal mass or adenopathy. The aorta is normal in caliber. Moderate Scattered atherosclerotic calcifications. The branch vessels are patent.  Reproductive: The uterus and ovaries are normal. The bladder is normal. No pelvic mass, adenopathy or free pelvic fluid collections. No inguinal mass or adenopathy. Other: No abdominal hernia or subcutaneous lesions. Musculoskeletal: No significant bony findings.  IMPRESSION: No acute abdominal/ pelvic findings, mass lesions or adenopathy. Moderate scattered atherosclerotic calcifications involving the aorta. Electronically Signed  By: Kalman Jewels M.D.  On: 11/04/2014 12:02    Assessment & Plan:   Problem List Items Addressed This Visit    Lower abdominal pain - Primary    Persistent trouble, working with nutritionist but has not been able to find food to cause these symptoms. GI workup unrevealing. Known IBS - will increase librax to TID with meals and add effexor 37.5mg  nightly (and slowly taper down on zoloft to 50mg  daily in am over next 1-2 wks).  Scattered atheroscloerotic plaque in abd aorta on recent imaging - ?mesenteric ischemia - check lactic acid today. If persistent pain despite above, discussed CTA bowels to further eval.  Pt agrees with plan. Has f/u on Monday for CPE.      Relevant Medications   venlafaxine XR (EFFEXOR XR) 37.5 MG 24 hr capsule   Other Relevant Orders   Lactic acid, venous, whole blood   TSH   IBS   Relevant Medications   sucralfate (CARAFATE)  1 GM/10ML suspension   clidinium-chlordiazePOXIDE (LIBRAX) 5-2.5 MG per capsule   venlafaxine XR (EFFEXOR XR) 37.5 MG 24 hr capsule   Abdominal aortic atherosclerosis   Relevant Orders   Lactic acid, venous, whole blood       Follow up plan: Return if symptoms worsen or fail to improve, for follow up visit.

## 2015-02-03 NOTE — Addendum Note (Signed)
Addended by: Marchia Bond on: 02/03/2015 04:35 PM   Modules accepted: Orders

## 2015-02-03 NOTE — Progress Notes (Signed)
Pre visit review using our clinic review tool, if applicable. No additional management support is needed unless otherwise documented below in the visit note. 

## 2015-02-04 LAB — LACTIC ACID, PLASMA: LACTIC ACID: 0.4 mmol/L — ABNORMAL LOW (ref 0.5–2.2)

## 2015-02-06 ENCOUNTER — Encounter: Payer: Self-pay | Admitting: Family Medicine

## 2015-02-06 ENCOUNTER — Ambulatory Visit (INDEPENDENT_AMBULATORY_CARE_PROVIDER_SITE_OTHER): Payer: BC Managed Care – PPO | Admitting: Family Medicine

## 2015-02-06 VITALS — BP 110/76 | HR 84 | Temp 98.4°F | Ht 64.5 in | Wt 148.5 lb

## 2015-02-06 DIAGNOSIS — E785 Hyperlipidemia, unspecified: Secondary | ICD-10-CM

## 2015-02-06 DIAGNOSIS — F411 Generalized anxiety disorder: Secondary | ICD-10-CM

## 2015-02-06 DIAGNOSIS — I10 Essential (primary) hypertension: Secondary | ICD-10-CM

## 2015-02-06 DIAGNOSIS — R1030 Lower abdominal pain, unspecified: Secondary | ICD-10-CM

## 2015-02-06 DIAGNOSIS — Z0000 Encounter for general adult medical examination without abnormal findings: Secondary | ICD-10-CM

## 2015-02-06 DIAGNOSIS — K219 Gastro-esophageal reflux disease without esophagitis: Secondary | ICD-10-CM

## 2015-02-06 DIAGNOSIS — I700 Atherosclerosis of aorta: Secondary | ICD-10-CM

## 2015-02-06 DIAGNOSIS — K589 Irritable bowel syndrome without diarrhea: Secondary | ICD-10-CM

## 2015-02-06 DIAGNOSIS — M8580 Other specified disorders of bone density and structure, unspecified site: Secondary | ICD-10-CM

## 2015-02-06 DIAGNOSIS — E611 Iron deficiency: Secondary | ICD-10-CM

## 2015-02-06 LAB — COMPREHENSIVE METABOLIC PANEL
ALT: 18 U/L (ref 0–35)
AST: 24 U/L (ref 0–37)
Albumin: 4.5 g/dL (ref 3.5–5.2)
Alkaline Phosphatase: 47 U/L (ref 39–117)
BUN: 21 mg/dL (ref 6–23)
CALCIUM: 9.9 mg/dL (ref 8.4–10.5)
CHLORIDE: 100 meq/L (ref 96–112)
CO2: 28 meq/L (ref 19–32)
Creatinine, Ser: 0.84 mg/dL (ref 0.40–1.20)
GFR: 72.53 mL/min (ref >60.00)
GLUCOSE: 103 mg/dL — AB (ref 70–99)
POTASSIUM: 3.8 meq/L (ref 3.5–5.1)
SODIUM: 134 meq/L — AB (ref 135–145)
Total Bilirubin: 0.5 mg/dL (ref 0.2–1.2)
Total Protein: 7 g/dL (ref 6.0–8.3)

## 2015-02-06 LAB — IBC PANEL
Iron: 93 ug/dL (ref 42–145)
SATURATION RATIOS: 21 % (ref 20.0–50.0)
TRANSFERRIN: 316 mg/dL (ref 212.0–360.0)

## 2015-02-06 LAB — LIPID PANEL
Cholesterol: 129 mg/dL (ref 0–200)
HDL: 38.4 mg/dL — ABNORMAL LOW (ref >39.00)
LDL Cholesterol: 68 mg/dL (ref 0–99)
NonHDL: 90.6
Total CHOL/HDL Ratio: 3
Triglycerides: 112 mg/dL (ref 0.0–149.0)
VLDL: 22.4 mg/dL (ref 0.0–40.0)

## 2015-02-06 LAB — FERRITIN: FERRITIN: 33.2 ng/mL (ref 10.0–291.0)

## 2015-02-06 MED ORDER — FAMOTIDINE 40 MG PO TABS: 80.0000 mg | ORAL_TABLET | Freq: Every day | ORAL | Status: DC

## 2015-02-06 MED ORDER — ONDANSETRON 4 MG PO TBDP: 4.0000 mg | ORAL_TABLET | Freq: Three times a day (TID) | ORAL | Status: DC | PRN

## 2015-02-06 NOTE — Addendum Note (Signed)
Addended by: Royann Shivers A on: 02/06/2015 11:57 AM   Modules accepted: Orders, Medications

## 2015-02-06 NOTE — Assessment & Plan Note (Signed)
Improving with support and improving GI. Trial effexor, cross taper off sertraline.

## 2015-02-06 NOTE — Progress Notes (Addendum)
BP 110/76 mmHg  Pulse 84  Temp(Src) 98.4 F (36.9 C) (Oral)  Ht 5' 4.5" (1.638 m)  Wt 148 lb 8 oz (67.359 kg)  BMI 25.11 kg/m2   CC: CPE  Subjective:    Patient ID: Shelby Vazquez, female    DOB: [birth year], 64 y.o.   MRN: [id number]  HPI: Shelby Vazquez is a 64 y.o. female presenting on 02/06/2015 for Annual Exam   See recent note for chronic lower abd pain - ?IBS vs mesenteric ischemia? Lactic acid normal. We also increased librax to TID AC and started effexor 37.5mg  daily (tapering off sertraline, onto effexor). effexor wiped her out so she has started taking at night time  Preventative: COLONOSCOPY WITH PROPOFOL Laterality: N/A Date: 03/31/2014 sessile serrated adenomas, sigmoid diverticulosis, rpt 3 yrs Cleotis Nipper, MD) ESOPHAGOGASTRODUODENOSCOPY Laterality: N/A Date: 03/31/2014 mult gastric polyps, mild mucosal hemorrhages; ESOPHAGOGASTRODUODENOSCOPY (EGD); Surgeon: Cleotis Nipper, MD Well woman with Dr. 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 has appt 05/2015. DEXA 01/23/2015 osteopenia T -1.7 (with Dr Helane Rima) - rec rpt 2 yrs. Mammogram 05/2014 normal. Flu shot - 08/2014 Pneumovax 01/2013 Td 2011 zostavax 04/2013 Seat belt use discussed Sunscreen use discussed, no changing moles on skin  Married 1973. 2 daughters '74, '80; 5 grandchildren.  No h/o physical or sexual abuse. 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  Edu: [employer] Activity: really enjoys hiking Diet: good water, fruits/vegetables daily  Relevant past medical, surgical, family and social history reviewed and updated as indicated. Interim medical history since our last visit reviewed. Allergies and medications reviewed and updated. Current Outpatient Prescriptions on File Prior to Visit  Medication Sig  . acetaminophen (TYLENOL) 500 MG tablet Take 500 mg by mouth as needed.  . clidinium-chlordiazePOXIDE (LIBRAX) 5-2.5  MG per capsule Take 1 capsule by mouth 3 (three) times daily before meals.  . Cyanocobalamin (VITAMIN B-12 IJ) Inject as directed every 30 (thirty) days.  Marland Kitchen loratadine (CLARITIN) 10 MG tablet Take 10 mg by mouth daily as needed for allergies.  . Polyethylene Glycol 3350 (MIRALAX PO) Take 17 g by mouth daily as needed (constipation (in 8oz fluid)).   Marland Kitchen PRESCRIPTION MEDICATION Apply topically 2 (two) times daily. Estradiol Progesterone cream 0.05-30 mg/ml  . RABEprazole (ACIPHEX) 20 MG tablet Take 20 mg by mouth daily.  . ranitidine (ZANTAC) 150 MG tablet Take 300 mg by mouth 2 (two) times daily.  . simvastatin (ZOCOR) 40 MG tablet Take 1 tablet (40 mg total) by mouth every evening.  . sucralfate (CARAFATE) 1 GM/10ML suspension Take 1 g by mouth 4 (four) times daily as needed.   . triamterene-hydrochlorothiazide (DYAZIDE) 37.5-25 MG per capsule Take 1 each (1 capsule total) by mouth every morning.  . venlafaxine XR (EFFEXOR XR) 37.5 MG 24 hr capsule Take 1 capsule (37.5 mg total) by mouth daily with breakfast. (Patient taking differently: Take 37.5 mg by mouth at bedtime. )  . promethazine (PHENERGAN) 25 MG tablet Take 1 tablet (25 mg total) by mouth every 8 (eight) hours as needed for nausea or vomiting. (Patient not taking: Reported on 02/06/2015)   No current facility-administered medications on file prior to visit.    Review of Systems  Constitutional: Positive for fatigue. Negative for fever, chills, activity change, appetite change and unexpected weight change.  HENT: Negative for hearing loss.   Eyes: Negative for visual disturbance.  Respiratory: Negative for cough, chest tightness, shortness of breath and wheezing.  Cardiovascular: Negative for chest pain, palpitations and leg swelling.  Gastrointestinal: Positive for nausea, vomiting, abdominal pain, diarrhea and abdominal distention. Negative for constipation and blood in stool.  Genitourinary: Negative for hematuria and difficulty  urinating.  Musculoskeletal: Negative for myalgias, arthralgias and neck pain.  Skin: Negative for rash.  Neurological: Negative for dizziness, seizures, syncope and headaches.  Hematological: Negative for adenopathy. Does not bruise/bleed easily.  Psychiatric/Behavioral: Negative for dysphoric mood. The patient is not nervous/anxious.    Per HPI unless specifically indicated above     Objective:    BP 110/76 mmHg  Pulse 84  Temp(Src) 98.4 F (36.9 C) (Oral)  Ht 5' 4.5" (1.638 m)  Wt 148 lb 8 oz (67.359 kg)  BMI 25.11 kg/m2  Wt Readings from Last 3 Encounters:  02/06/15 148 lb 8 oz (67.359 kg)  02/03/15 147 lb 4 oz (66.792 kg)  11/03/14 149 lb 8 oz (67.813 kg)    Physical Exam  Constitutional: She is oriented to person, place, and time. She appears well-developed and well-nourished. No distress.  HENT:  Head: Normocephalic and atraumatic.  Right Ear: Hearing, tympanic membrane, external ear and ear canal normal.  Left Ear: Hearing, tympanic membrane, external ear and ear canal normal.  Nose: Nose normal.  Mouth/Throat: Uvula is midline, oropharynx is clear and moist and mucous membranes are normal. No oropharyngeal exudate, posterior oropharyngeal edema or posterior oropharyngeal erythema.  Eyes: Conjunctivae and EOM are normal. Pupils are equal, round, and reactive to light. No scleral icterus.  Neck: Normal range of motion. Neck supple. Carotid bruit is not present. No thyromegaly present.  Cardiovascular: Normal rate, regular rhythm, normal heart sounds and intact distal pulses.   No murmur heard. Pulses:      Radial pulses are 2+ on the right side, and 2+ on the left side.  Pulmonary/Chest: Effort normal and breath sounds normal. No respiratory distress. She has no wheezes. She has no rales.  Abdominal: Soft. Bowel sounds are normal. She exhibits no distension and no mass. There is tenderness (mild) in the left lower quadrant. There is no rebound and no guarding.    Musculoskeletal: Normal range of motion. She exhibits no edema.  Lymphadenopathy:    She has no cervical adenopathy.  Neurological: She is alert and oriented to person, place, and time.  CN grossly intact, station and gait intact  Skin: Skin is warm and dry. No rash noted.  Psychiatric: She has a normal mood and affect. Her behavior is normal. Judgment and thought content normal.  Nursing note and vitals reviewed.  Results for orders placed or performed in visit on 02/06/15  HM MAMMOGRAPHY  Result Value Ref Range   HM Mammogram normal per patient   Lipid panel  Result Value Ref Range   Cholesterol 129 0 - 200 mg/dL   Triglycerides 112.0 0.0 - 149.0 mg/dL   HDL 38.40 (L) >39.00 mg/dL   VLDL 22.4 0.0 - 40.0 mg/dL   LDL Cholesterol 68 0 - 99 mg/dL   Total CHOL/HDL Ratio 3    NonHDL 90.60   Comprehensive metabolic panel  Result Value Ref Range   Sodium 134 (L) 135 - 145 mEq/L   Potassium 3.8 3.5 - 5.1 mEq/L   Chloride 100 96 - 112 mEq/L   CO2 28 19 - 32 mEq/L   Glucose, Bld 103 (H) 70 - 99 mg/dL   BUN 21 6 - 23 mg/dL   Creatinine, Ser 0.84 0.40 - 1.20 mg/dL   Total Bilirubin 0.5 0.2 -  1.2 mg/dL   Alkaline Phosphatase 47 39 - 117 U/L   AST 24 0 - 37 U/L   ALT 18 0 - 35 U/L   Total Protein 7.0 6.0 - 8.3 g/dL   Albumin 4.5 3.5 - 5.2 g/dL   Calcium 9.9 8.4 - 10.5 mg/dL   GFR 72.53 >60.00 mL/min  IBC panel  Result Value Ref Range   Iron 93 42 - 145 ug/dL   Transferrin 316.0 212.0 - 360.0 mg/dL   Saturation Ratios 21.0 20.0 - 50.0 %  Ferritin  Result Value Ref Range   Ferritin 33.2 10.0 - 291.0 ng/mL      Assessment & Plan:   Problem List Items Addressed This Visit    Routine health maintenance - Primary    Preventative protocols reviewed and updated unless pt declined. Discussed healthy diet and lifestyle.       Osteopenia    Reviewed latest DEXA - continue calcium and vitamin D.      Lower abdominal pain    See below. Thought IBS      Iron deficiency     Check labs today.      Relevant Orders   IBC panel (Completed)   Ferritin (Completed)   IBS    Deteriorated - see prior note. Lactic acid normal. Trial of effexor and increased librax, as well as continue probiotic. Add zofran ODT      Relevant Medications   ondansetron (ZOFRAN ODT) 4 MG disintegrating tablet   famotidine (PEPCID) 40 MG tablet   Probiotic Product (PROBIOTIC PO)   HLD (hyperlipidemia)    Check labs today. Continue simvastatin.       Relevant Orders   Lipid panel (Completed)   Comprehensive metabolic panel (Completed)   GERD, SEVERE    Refilled pepcid. Continue aciphex.      Relevant Medications   ondansetron (ZOFRAN ODT) 4 MG disintegrating tablet   famotidine (PEPCID) 40 MG tablet   Probiotic Product (PROBIOTIC PO)   GAD (generalized anxiety disorder)    Discussed cross taper from sertraline to effexor. Increased librax to TID AC.      Essential hypertension    Chronic, stable.      Relevant Orders   Comprehensive metabolic panel (Completed)   Abdominal aortic atherosclerosis    Consider starting ASA.          Follow up plan: Return as needed, for follow up visit.

## 2015-02-06 NOTE — Assessment & Plan Note (Signed)
Consider starting ASA.

## 2015-02-06 NOTE — Assessment & Plan Note (Signed)
See below. Thought IBS

## 2015-02-06 NOTE — Assessment & Plan Note (Signed)
Discussed cross taper from sertraline to effexor. Increased librax to TID AC.

## 2015-02-06 NOTE — Assessment & Plan Note (Signed)
Check labs today.

## 2015-02-06 NOTE — Assessment & Plan Note (Signed)
Deteriorated - see prior note. Lactic acid normal. Trial of effexor and increased librax, as well as continue probiotic. Add zofran ODT

## 2015-02-06 NOTE — Assessment & Plan Note (Signed)
Reviewed latest DEXA - continue calcium and vitamin D.

## 2015-02-06 NOTE — Assessment & Plan Note (Addendum)
Refilled pepcid. Continue aciphex.

## 2015-02-06 NOTE — Patient Instructions (Addendum)
Take sertraline 50mg  for 1 week then cut down to 25mg  daily for 1 week then stop. After you stop setraline, increase effexor to 2 tablets nightly (75mg ).  labwork today. Good to see you today, call us with questions.

## 2015-02-06 NOTE — Assessment & Plan Note (Signed)
Chronic, stable 

## 2015-02-06 NOTE — Progress Notes (Signed)
Pre visit review using our clinic review tool, if applicable. No additional management support is needed unless otherwise documented below in the visit note. 

## 2015-02-06 NOTE — Assessment & Plan Note (Signed)
Check labs today. Continue simvastatin.

## 2015-02-06 NOTE — Assessment & Plan Note (Signed)
Preventative protocols reviewed and updated unless pt declined. Discussed healthy diet and lifestyle.  

## 2015-02-08 NOTE — Addendum Note (Signed)
Addended by: Ria Bush on: 02/08/2015 08:13 AM   Modules accepted: Miquel Dunn

## 2015-02-09 ENCOUNTER — Other Ambulatory Visit: Payer: Self-pay | Admitting: Family Medicine

## 2015-02-14 ENCOUNTER — Encounter: Payer: Self-pay | Admitting: Family Medicine

## 2015-02-26 ENCOUNTER — Encounter: Payer: Self-pay | Admitting: Family Medicine

## 2015-02-28 ENCOUNTER — Other Ambulatory Visit: Payer: Self-pay | Admitting: *Deleted

## 2015-02-28 MED ORDER — CYANOCOBALAMIN 1000 MCG/ML IJ SOLN: INTRAMUSCULAR | Status: DC

## 2015-03-12 ENCOUNTER — Other Ambulatory Visit: Payer: Self-pay | Admitting: Family Medicine

## 2015-03-13 ENCOUNTER — Other Ambulatory Visit: Payer: Self-pay

## 2015-03-13 NOTE — Telephone Encounter (Signed)
Pt left v/m; pt no longer taking zoloft; pt request new rx for effexor xr 37.5 mg with instructions to take 2 tab nightly to [facility] [address]. Pt will be out of med on 03/14/15. 02/06/15 office note does note after pt stops sertraline to increase effexor to 2 tabs nightly.Please advise. Have not changed on med list until approved by Dr Darnell Level.

## 2015-03-14 MED ORDER — VENLAFAXINE HCL ER 75 MG PO CP24: ORAL_CAPSULE | ORAL | Status: DC

## 2015-03-14 NOTE — Telephone Encounter (Signed)
Sent in 75mg  effexor XR dose

## 2015-03-26 ENCOUNTER — Other Ambulatory Visit: Payer: Self-pay | Admitting: Family Medicine

## 2015-03-27 ENCOUNTER — Ambulatory Visit: Payer: BC Managed Care – PPO | Admitting: Family Medicine

## 2015-03-27 NOTE — Telephone Encounter (Signed)
Printed and in Kim's box 

## 2015-03-27 NOTE — Telephone Encounter (Signed)
Rx faxed to [facility].

## 2015-03-29 ENCOUNTER — Other Ambulatory Visit: Payer: Self-pay | Admitting: *Deleted

## 2015-03-29 MED ORDER — VENLAFAXINE HCL ER 75 MG PO CP24: ORAL_CAPSULE | ORAL | Status: DC

## 2015-04-03 ENCOUNTER — Other Ambulatory Visit: Payer: Self-pay | Admitting: *Deleted

## 2015-04-03 MED ORDER — FAMOTIDINE 40 MG PO TABS: 80.0000 mg | ORAL_TABLET | Freq: Every day | ORAL | Status: DC

## 2015-04-03 NOTE — Telephone Encounter (Signed)
Received refill request electronically from pharmacy for Famotidine. Refill sent to pharmacy electronically.

## 2015-05-22 LAB — HM MAMMOGRAPHY: HM Mammogram: NORMAL (ref 0–4)

## 2015-06-02 ENCOUNTER — Other Ambulatory Visit: Payer: Self-pay | Admitting: Family Medicine

## 2015-06-26 ENCOUNTER — Other Ambulatory Visit: Payer: Self-pay | Admitting: Family Medicine

## 2015-06-27 MED ORDER — CILIDINIUM-CHLORDIAZEPOXIDE 2.5-5 MG PO CAPS: 1.0000 | ORAL_CAPSULE | Freq: Two times a day (BID) | ORAL | Status: DC

## 2015-06-27 NOTE — Telephone Encounter (Signed)
plz phone in. 

## 2015-06-27 NOTE — Telephone Encounter (Signed)
Rx faxed to [facility].

## 2015-06-27 NOTE — Telephone Encounter (Signed)
Signed and in Kim's box. 

## 2015-06-27 NOTE — Telephone Encounter (Signed)
Will require written Rx for faxing. In your IN box for signature.

## 2015-06-29 ENCOUNTER — Other Ambulatory Visit: Payer: Self-pay | Admitting: Obstetrics and Gynecology

## 2015-06-30 LAB — CYTOLOGY - PAP

## 2015-08-16 ENCOUNTER — Ambulatory Visit (INDEPENDENT_AMBULATORY_CARE_PROVIDER_SITE_OTHER)
Admission: RE | Admit: 2015-08-16 | Discharge: 2015-08-16 | Disposition: A | Discharge status: Home / Self Care | Payer: BC Managed Care – PPO | Source: Ambulatory Visit | Attending: Family Medicine | Admitting: Family Medicine

## 2015-08-16 ENCOUNTER — Encounter: Payer: Self-pay | Admitting: Family Medicine

## 2015-08-16 ENCOUNTER — Ambulatory Visit (INDEPENDENT_AMBULATORY_CARE_PROVIDER_SITE_OTHER): Payer: BC Managed Care – PPO | Admitting: Family Medicine

## 2015-08-16 VITALS — BP 138/84 | HR 104 | Temp 98.0°F | Wt 154.5 lb

## 2015-08-16 DIAGNOSIS — M79672 Pain in left foot: Secondary | ICD-10-CM | POA: Insufficient documentation

## 2015-08-16 NOTE — Patient Instructions (Addendum)
Xray of left foot today - I am suspicious for stress fracture of 2nd toe.  Use post op shoe for next 2 weeks, rest and elevate foot as much as able. Return in 10 days for follow up with Dr Lorelei Pont our sports medicine doctor. Good to see you today, call us with questions.

## 2015-08-16 NOTE — Progress Notes (Signed)
BP 138/84 mmHg  Pulse 104  Temp(Src) 98 F (36.7 C) (Oral)  Wt 154 lb 8 oz (70.081 kg)   CC: L foot pain  Subjective:    Patient ID: Shelby Vazquez, female    DOB: Apr 04, 1951, 64 y.o.   MRN: [id number]  HPI: [name] is a 64 y.o. female presenting on 08/16/2015 for Foot Pain   2 wk h/o left dorsal foot pain with swelling over the last week. No redness, warmth, fever. Better with rest, worse with activity. Denies inciting trauma/injury. Ran two 5k's over last 2 weeks. Felt may have injured foot on last curve running towards finish line. Also walking more at beach over last few weeks. Increased running over last 3 months.   Recent plantar fasciitis flare that has improved with stretching, soaking in sea salt/mangesium, ice.   Relevant past medical, surgical, family and social history reviewed and updated as indicated. Interim medical history since our last visit reviewed. Allergies and medications reviewed and updated. Current Outpatient Prescriptions on File Prior to Visit  Medication Sig  . acetaminophen (TYLENOL) 500 MG tablet Take 500 mg by mouth as needed.  . Calcium Carb-Cholecalciferol 600-800 MG-UNIT TABS Take 1 tablet by mouth daily.  . clidinium-chlordiazePOXIDE (LIBRAX) 5-2.5 MG per capsule Take 1 capsule by mouth 2 (two) times daily.  . cyanocobalamin (,VITAMIN B-12,) 1000 MCG/ML injection inject intramuscularly every 30 DAYS  . famotidine (PEPCID) 40 MG tablet Take 2 tablets (80 mg total) by mouth at bedtime.  Marland Kitchen loratadine (CLARITIN) 10 MG tablet Take 10 mg by mouth daily as needed for allergies.  Marland Kitchen ondansetron (ZOFRAN ODT) 4 MG disintegrating tablet Take 1 tablet (4 mg total) by mouth every 8 (eight) hours as needed for nausea or vomiting.  . Polyethylene Glycol 3350 (MIRALAX PO) Take 17 g by mouth daily as needed (constipation (in 8oz fluid)).   Marland Kitchen PRESCRIPTION MEDICATION Apply topically 2 (two) times daily. Estradiol Progesterone cream 0.05-30 mg/ml  . Probiotic  Product (PROBIOTIC PO) Take 1 capsule by mouth daily.  . RABEprazole (ACIPHEX) 20 MG tablet Take 20 mg by mouth daily.  . ranitidine (ZANTAC) 150 MG tablet Take 300 mg by mouth 2 (two) times daily.  . simvastatin (ZOCOR) 40 MG tablet TAKE 1 TABLET EVERY EVENING  . triamterene-hydrochlorothiazide (DYAZIDE) 37.5-25 MG per capsule TAKE 1 CAPSULE EVERY MORNING  . venlafaxine XR (EFFEXOR-XR) 75 MG 24 hr capsule take 2 capsule by mouth once daily WITH BREAKFAST  . promethazine (PHENERGAN) 25 MG tablet Take 1 tablet (25 mg total) by mouth every 8 (eight) hours as needed for nausea or vomiting. (Patient not taking: Reported on 02/06/2015)  . sucralfate (CARAFATE) 1 GM/10ML suspension Take 1 g by mouth 4 (four) times daily as needed.    No current facility-administered medications on file prior to visit.    Review of Systems Per HPI unless specifically indicated in ROS section     Objective:    BP 138/84 mmHg  Pulse 104  Temp(Src) 98 F (36.7 C) (Oral)  Wt 154 lb 8 oz (70.081 kg)  Wt Readings from Last 3 Encounters:  08/16/15 154 lb 8 oz (70.081 kg)  02/06/15 148 lb 8 oz (67.359 kg)  02/03/15 147 lb 4 oz (66.792 kg)    Physical Exam  Constitutional: She appears well-developed and well-nourished. No distress.  Musculoskeletal:  2+ DP bilaterally  R foot with medially deviated great toe after bunion surgery, no pain or swelling. L foot - no pain at ankle  ligaments, no laxity, no pain at base of 5th MT, no pain at 1st MTP, neg calcaneal squeeze test. Tender to palpation dorsal midshaft 2nd/3rd MT's. Ddema present dorsal midfoot  Skin: Skin is warm and dry. No rash noted. No erythema.  Nursing note and vitals reviewed.     Assessment & Plan:   Problem List Items Addressed This Visit    Left foot pain - Primary    Concern for metatarsal stress fracture after increased running over last 3 months.  Xray today - concern for stress fracture proximal 2nd MT due to developing callus at one area  on AP view. Await final read. Treat with post op shoe, scheduled tylenol, rest.  F/u in 1-2 wks with podiatry - pt requests Charlott Holler of [facility] in [place]. Referral placed, CD burned.      Relevant Orders   DG Foot Complete Left   Ambulatory referral to Podiatry       Follow up plan: Return if symptoms worsen or fail to improve.

## 2015-08-16 NOTE — Progress Notes (Signed)
Pre visit review using our clinic review tool, if applicable. No additional management support is needed unless otherwise documented below in the visit note. 

## 2015-08-16 NOTE — Assessment & Plan Note (Addendum)
Concern for metatarsal stress fracture after increased running over last 3 months.  Xray today - concern for stress fracture proximal 2nd MT due to developing callus at one area on AP view. Await final read. Treat with post op shoe, scheduled tylenol, rest.  F/u in 1-2 wks with podiatry - pt requests Charlott Holler of [facility] in [place]. Referral placed, CD burned.

## 2015-08-31 ENCOUNTER — Encounter: Payer: Self-pay | Admitting: Family Medicine

## 2015-09-11 ENCOUNTER — Other Ambulatory Visit: Payer: Self-pay | Admitting: Family Medicine

## 2015-09-26 ENCOUNTER — Telehealth: Payer: Self-pay | Admitting: *Deleted

## 2015-09-26 NOTE — Telephone Encounter (Signed)
Med substitution form in your IN box for review.

## 2015-09-28 NOTE — Telephone Encounter (Signed)
ok to fill with other generic. In Kim's box.

## 2015-09-29 NOTE — Telephone Encounter (Signed)
Form faxed

## 2015-10-31 ENCOUNTER — Encounter: Payer: Self-pay | Admitting: Family Medicine

## 2015-12-06 ENCOUNTER — Other Ambulatory Visit: Payer: Self-pay | Admitting: *Deleted

## 2015-12-06 NOTE — Telephone Encounter (Signed)
Ok to refill? Will need written script for mail order. 

## 2015-12-07 MED ORDER — CILIDINIUM-CHLORDIAZEPOXIDE 2.5-5 MG PO CAPS: 1.0000 | ORAL_CAPSULE | Freq: Two times a day (BID) | ORAL | Status: DC

## 2015-12-07 NOTE — Telephone Encounter (Signed)
Rx faxed

## 2015-12-07 NOTE — Telephone Encounter (Signed)
Printed and in Kim's box 

## 2015-12-11 ENCOUNTER — Telehealth: Payer: Self-pay | Admitting: Family Medicine

## 2015-12-11 NOTE — Telephone Encounter (Signed)
[facility] called needing a DEA # for Dr. Danise Mina.  Called with Ref # [id number] to the number given for [facility] [id number] [facility] #.

## 2015-12-18 ENCOUNTER — Telehealth: Payer: Self-pay | Admitting: Family Medicine

## 2015-12-18 ENCOUNTER — Ambulatory Visit: Payer: BC Managed Care – PPO | Admitting: Podiatry

## 2015-12-18 ENCOUNTER — Ambulatory Visit (INDEPENDENT_AMBULATORY_CARE_PROVIDER_SITE_OTHER): Payer: BC Managed Care – PPO | Admitting: Family Medicine

## 2015-12-18 ENCOUNTER — Encounter: Payer: Self-pay | Admitting: Family Medicine

## 2015-12-18 VITALS — BP 118/78 | HR 96 | Temp 98.4°F | Wt 154.5 lb

## 2015-12-18 DIAGNOSIS — J111 Influenza due to unidentified influenza virus with other respiratory manifestations: Secondary | ICD-10-CM | POA: Insufficient documentation

## 2015-12-18 DIAGNOSIS — R6889 Other general symptoms and signs: Secondary | ICD-10-CM | POA: Diagnosis not present

## 2015-12-18 LAB — POCT INFLUENZA A/B
Influenza A, POC: POSITIVE — AB
Influenza B, POC: NEGATIVE

## 2015-12-18 MED ORDER — GUAIFENESIN-CODEINE 100-10 MG/5ML PO SYRP: 5.0000 mL | ORAL_SOLUTION | Freq: Two times a day (BID) | ORAL | Status: DC | PRN

## 2015-12-18 NOTE — Telephone Encounter (Signed)
Shelby Vazquez with [facility] called; pt had already hung up; lt sided CP earlier, body aches, had severe pain during night but ibuprofen relieved pt; pt refused ED disposition; pt has appt to see Dr Darnell Level on 12/18/15 at 11:15; Stanton Kidney did advise pt if condition worsened prior to appt pt to go to ED.

## 2015-12-18 NOTE — Patient Instructions (Signed)

## 2015-12-18 NOTE — Assessment & Plan Note (Signed)
Flu swab positive today Supportive care discussed, continue tylenol, add codeine cough syrup Anticipate epigastric/L lower ribcage pain this morning from MSK strain. Red flags to return for further care discussed. Update if fever >101, worsening productive cough, or prolonged duration of symptoms.

## 2015-12-18 NOTE — Progress Notes (Signed)
Pre visit review using our clinic review tool, if applicable. No additional management support is needed unless otherwise documented below in the visit note. 

## 2015-12-18 NOTE — Progress Notes (Addendum)
BP 118/78 mmHg  Pulse 96  Temp(Src) 98.4 F (36.9 C) (Oral)  Wt 154 lb 8 oz (70.081 kg)  SpO2 95%   CC: cough, chest congestion, body aches  Subjective:    Patient ID: Shelby Vazquez, female    DOB: Jul 18, 1951, 65 y.o.   MRN: [id number]  HPI: CHIVONNE HEIDE is a 65 y.o. female presenting on 12/18/2015 for URI   5d h/o malaise then developed nonproductive cough, body aches. Last night developed worsening epigastric pain and LUQ that radiated to back.this slowly improved after heat and ibuprofen. Worse pain than normal reflux pain. + chills.   Self treating with acetaminophen and delsym and resting over weekend.   No fevers, ear or tooth pain or headache. No nausea/vomiting, diarrhea/constipation.   Husband also sick with fever, cough, nasal congestion as well as grandchildren.   Relevant past medical, surgical, family and social history reviewed and updated as indicated. Interim medical history since our last visit reviewed. Allergies and medications reviewed and updated. Current Outpatient Prescriptions on File Prior to Visit  Medication Sig  . acetaminophen (TYLENOL) 500 MG tablet Take 500 mg by mouth as needed.  . Calcium Carb-Cholecalciferol 600-800 MG-UNIT TABS Take 1 tablet by mouth daily.  . clidinium-chlordiazePOXIDE (LIBRAX) 5-2.5 MG capsule Take 1 capsule by mouth 2 (two) times daily.  . cyanocobalamin (,VITAMIN B-12,) 1000 MCG/ML injection inject intramuscularly every 30 DAYS  . famotidine (PEPCID) 40 MG tablet TAKE 2 TABLETS AT BEDTIME  . loratadine (CLARITIN) 10 MG tablet Take 10 mg by mouth daily as needed for allergies.  . Polyethylene Glycol 3350 (MIRALAX PO) Take 17 g by mouth daily as needed (constipation (in 8oz fluid)).   Marland Kitchen PRESCRIPTION MEDICATION Apply topically 2 (two) times daily. Estradiol Progesterone cream 0.05-30 mg/ml  . Probiotic Product (PROBIOTIC PO) Take 1 capsule by mouth daily.  . RABEprazole (ACIPHEX) 20 MG tablet Take 20 mg by mouth daily.  .  ranitidine (ZANTAC) 150 MG tablet Take 300 mg by mouth 2 (two) times daily.  . simvastatin (ZOCOR) 40 MG tablet TAKE 1 TABLET EVERY EVENING  . sucralfate (CARAFATE) 1 GM/10ML suspension Take 1 g by mouth 4 (four) times daily as needed.   . triamterene-hydrochlorothiazide (DYAZIDE) 37.5-25 MG per capsule TAKE 1 CAPSULE EVERY MORNING  . venlafaxine XR (EFFEXOR-XR) 75 MG 24 hr capsule take 2 capsule by mouth once daily WITH BREAKFAST  . ondansetron (ZOFRAN ODT) 4 MG disintegrating tablet Take 1 tablet (4 mg total) by mouth every 8 (eight) hours as needed for nausea or vomiting. (Patient not taking: Reported on 12/18/2015)  . promethazine (PHENERGAN) 25 MG tablet Take 1 tablet (25 mg total) by mouth every 8 (eight) hours as needed for nausea or vomiting. (Patient not taking: Reported on 02/06/2015)   No current facility-administered medications on file prior to visit.    Review of Systems Per HPI unless specifically indicated in ROS section     Objective:    BP 118/78 mmHg  Pulse 96  Temp(Src) 98.4 F (36.9 C) (Oral)  Wt 154 lb 8 oz (70.081 kg)  SpO2 95%  Wt Readings from Last 3 Encounters:  12/18/15 154 lb 8 oz (70.081 kg)  08/16/15 154 lb 8 oz (70.081 kg)  02/06/15 148 lb 8 oz (67.359 kg)    Physical Exam  Constitutional: She appears well-developed and well-nourished. No distress.  Tired, fatigued appearing  HENT:  Head: Normocephalic and atraumatic.  Right Ear: Hearing, tympanic membrane, external ear and ear canal normal.  Left Ear: Hearing, tympanic membrane, external ear and ear canal normal.  Nose: No mucosal edema or rhinorrhea. Right sinus exhibits no maxillary sinus tenderness and no frontal sinus tenderness. Left sinus exhibits no maxillary sinus tenderness and no frontal sinus tenderness.  Mouth/Throat: Uvula is midline, oropharynx is clear and moist and mucous membranes are normal. No oropharyngeal exudate, posterior oropharyngeal edema, posterior oropharyngeal erythema or  tonsillar abscesses.  Nasal mucosal injection  Eyes: Conjunctivae and EOM are normal. Pupils are equal, round, and reactive to light. No scleral icterus.  Neck: Normal range of motion. Neck supple.  Cardiovascular: Normal rate, regular rhythm, normal heart sounds and intact distal pulses.   No murmur heard. Pulmonary/Chest: Effort normal and breath sounds normal. No respiratory distress. She has no wheezes. She has no rales. She exhibits tenderness (L lower ribcage).    Reproducible tenderness L lower ribcage Dry harsh cough present  Abdominal: Soft. Normal appearance. She exhibits no distension. There is tenderness (mild) in the epigastric area. There is no rigidity, no rebound, no guarding and negative Murphy's sign.  Lymphadenopathy:    She has no cervical adenopathy.  Skin: Skin is warm and dry. No rash noted.  Nursing note and vitals reviewed.  Results for orders placed or performed in visit on 12/18/15  POCT Influenza A/B  Result Value Ref Range   Influenza A, POC Positive (A) Negative   Influenza B, POC Negative Negative      Assessment & Plan:   Problem List Items Addressed This Visit    Influenza - Primary    Flu swab positive today Supportive care discussed, continue tylenol, add codeine cough syrup Anticipate epigastric/L lower ribcage pain this morning from MSK strain. Red flags to return for further care discussed. Update if fever >101, worsening productive cough, or prolonged duration of symptoms.       Other Visit Diagnoses    Influenza-like symptoms        Relevant Orders    POCT Influenza A/B (Completed)        Follow up plan: Return if symptoms worsen or fail to improve.

## 2015-12-18 NOTE — Addendum Note (Signed)
Addended by: Ria Bush on: 12/18/2015 12:02 PM   Modules accepted: Miquel Dunn

## 2015-12-18 NOTE — Telephone Encounter (Signed)
Patient Name: Shelby Vazquez  DOB: 1950-11-30    Initial Comment Caller states, wife woke up with Lt side, extreme chest pains several times last night, has a dry cough, still feel uncomfortable , 98.9   Nurse Assessment  Nurse: Verlin Fester, RN, Stanton Kidney Date/Time (Eastern Time): 12/18/2015 8:14:18 AM  Confirm and document reason for call. If symptomatic, describe symptoms. You must click the next button to save text entered. ---States his wife woke up with Lt side, extreme chest pains several times last night, has a dry cough, still feel uncomfortable , 98.9. The chest pain is on the left side of the sternum.  Has the patient traveled out of the country within the last 30 days? ---No  Does the patient have any new or worsening symptoms? ---Yes  Will a triage be completed? ---Yes  Related visit to physician within the last 2 weeks? ---No  Does the PT have any chronic conditions? (i.e. diabetes, asthma, etc.) ---Yes  List chronic conditions. ---"acid reflux, IBS, HTN  Is this a behavioral health or substance abuse call? ---No     Guidelines    Guideline Title Affirmed Question Affirmed Notes  Chest Pain Chest pain lasts > 5 minutes (Exceptions: chest pain occurring > 3 days ago and now asymptomatic; same as previously diagnosed heartburn and has accompanying sour taste in mouth)    Final Disposition User   Go to ED Now (or PCP triage) Verlin Fester, RN, [place]    Comments  Caller instructed that if his wife's pain comes back or worsens that they do need to go to ED right away   Referrals  REFERRED TO PCP OFFICE   Disagree/Comply: Disagree  Disagree/Comply Reason: Disagree with instructions   Refused ED outcome states they have appt at 11:15 this morning

## 2015-12-26 ENCOUNTER — Other Ambulatory Visit: Payer: Self-pay | Admitting: Family Medicine

## 2015-12-26 NOTE — Telephone Encounter (Signed)
Received refill electronically Last refill 12/07/15 #180 Last office visit 12/17/14/acute

## 2015-12-27 NOTE — Telephone Encounter (Signed)
Rx faxed to mail order 

## 2015-12-27 NOTE — Telephone Encounter (Signed)
Printed and in kims'box  

## 2016-01-24 ENCOUNTER — Other Ambulatory Visit: Payer: Self-pay | Admitting: *Deleted

## 2016-01-24 MED ORDER — FAMOTIDINE 40 MG PO TABS: 80.0000 mg | ORAL_TABLET | Freq: Every day | ORAL | Status: DC

## 2016-01-24 MED ORDER — CYANOCOBALAMIN 1000 MCG/ML IJ SOLN: INTRAMUSCULAR | Status: DC

## 2016-01-24 MED ORDER — TRIAMTERENE-HCTZ 37.5-25 MG PO CAPS: 1.0000 | ORAL_CAPSULE | Freq: Every morning | ORAL | Status: DC

## 2016-01-24 MED ORDER — SIMVASTATIN 40 MG PO TABS: 40.0000 mg | ORAL_TABLET | Freq: Every evening | ORAL | Status: DC

## 2016-01-24 NOTE — Telephone Encounter (Signed)
Ok to refill? Will need written script of mail order. Thanks!

## 2016-01-25 MED ORDER — CYANOCOBALAMIN 1000 MCG/ML IJ SOLN: INTRAMUSCULAR | Status: DC

## 2016-01-25 MED ORDER — CILIDINIUM-CHLORDIAZEPOXIDE 2.5-5 MG PO CAPS: 1.0000 | ORAL_CAPSULE | Freq: Two times a day (BID) | ORAL | Status: DC

## 2016-01-25 NOTE — Telephone Encounter (Signed)
Printed and in Kim's box 

## 2016-01-25 NOTE — Telephone Encounter (Signed)
Rx faxed to mail order 

## 2016-01-26 ENCOUNTER — Telehealth: Payer: Self-pay | Admitting: Family Medicine

## 2016-01-26 DIAGNOSIS — K219 Gastro-esophageal reflux disease without esophagitis: Secondary | ICD-10-CM

## 2016-01-26 MED ORDER — SUCRALFATE 1 GM/10ML PO SUSP: 1.0000 g | Freq: Four times a day (QID) | ORAL | Status: DC | PRN

## 2016-01-26 NOTE — Telephone Encounter (Signed)
Noted. Reviewed chart and noted she takes for severe GERD. Refill sent to pharmacy as requested. Please keep upcoming appointment with PCP.

## 2016-01-26 NOTE — Telephone Encounter (Signed)
Per [place] note pt request refill carafate [facility] [address]pt has appt on 01/30/16 at 2 pm with Dr Gutierrez.(per appt note pt was offered sooner appt but pt said she only sees Dr Darnell Level) Dr Darnell Level out of office.

## 2016-01-26 NOTE — Telephone Encounter (Signed)
atient Name: Shelby Vazquez  DOB: 16-Oct-1951    Initial Comment Caller states, history of acid reflux, vocal cords and glands swell, she wants a certain Rx to help with this. Coughing a lot.    Nurse Assessment  Nurse: Raphael Gibney, RN, Vanita Ingles Date/Time (Eastern Time): 01/26/2016 12:41:29 PM  Confirm and document reason for call. If symptomatic, describe symptoms. You must click the next button to save text entered. ---Caller states she has a history of acid reflux. her vocal swell and she can hardly talk. She uses Carafate when this happens. She has a cough. She has an appt on Tuesday.  Has the patient traveled out of the country within the last 30 days? ---No  Does the patient have any new or worsening symptoms? ---Yes  Will a triage be completed? ---No  Select reason for no triage. ---Patient declined  Please document clinical information provided and list any resource used. ---Pt states she has had these symptoms several times. She does not want to talk about her symptoms as she already knows what to do and has a hard time talking. She would like a prescription for Carafate sent to her pharmacy.     Guidelines    Guideline Title Affirmed Question Affirmed Notes       Final Disposition User   Clinical Call Kendrick, RN, Vanita Ingles    Comments  Pt would like a prescription for Carafate sent over to her pharmacy to help with her acid reflux until her appt on Tuesday. She does not want to see another doctor. Please call back and let her know if medication will be sent in.  Pt states when she has acid reflux her vocal cords swell and she can hardly talk.

## 2016-01-26 NOTE — Telephone Encounter (Signed)
Pt notified as instructed. Pt was appreciative. Sending to Dr Darnell Level as Juluis Rainier.

## 2016-01-30 ENCOUNTER — Encounter: Payer: Self-pay | Admitting: Family Medicine

## 2016-01-30 ENCOUNTER — Ambulatory Visit (INDEPENDENT_AMBULATORY_CARE_PROVIDER_SITE_OTHER): Payer: Medicare Other | Admitting: Family Medicine

## 2016-01-30 VITALS — BP 122/84 | HR 94 | Temp 98.0°F | Wt 153.0 lb

## 2016-01-30 DIAGNOSIS — J209 Acute bronchitis, unspecified: Secondary | ICD-10-CM | POA: Diagnosis not present

## 2016-01-30 DIAGNOSIS — K219 Gastro-esophageal reflux disease without esophagitis: Secondary | ICD-10-CM | POA: Diagnosis not present

## 2016-01-30 DIAGNOSIS — R053 Chronic cough: Secondary | ICD-10-CM

## 2016-01-30 DIAGNOSIS — R05 Cough: Secondary | ICD-10-CM | POA: Diagnosis not present

## 2016-01-30 LAB — POC INFLUENZA A&B (BINAX/QUICKVUE)
Influenza A, POC: NEGATIVE
Influenza B, POC: NEGATIVE

## 2016-01-30 MED ORDER — GUAIFENESIN-CODEINE 100-10 MG/5ML PO SYRP: 5.0000 mL | ORAL_SOLUTION | Freq: Every evening | ORAL | Status: DC | PRN

## 2016-01-30 MED ORDER — PREDNISONE 20 MG PO TABS: ORAL_TABLET | ORAL | Status: DC

## 2016-01-30 MED ORDER — AZITHROMYCIN 250 MG PO TABS: ORAL_TABLET | ORAL | Status: DC

## 2016-01-30 MED ORDER — SUCRALFATE 1 GM/10ML PO SUSP: 1.0000 g | Freq: Four times a day (QID) | ORAL | Status: DC | PRN

## 2016-01-30 NOTE — Progress Notes (Signed)
Pre visit review using our clinic review tool, if applicable. No additional management support is needed unless otherwise documented below in the visit note. 

## 2016-01-30 NOTE — Assessment & Plan Note (Signed)
1 wk of worsening cough. Anticipate influenza like illness vs bronchitis. 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 Cover for atypical bronchitis with zpack. Update if not improving as expected. Pt agrees. Check flu swab today - negative.

## 2016-01-30 NOTE — Patient Instructions (Signed)
I think you have bronchitis leading to vocal cord inflammation and flare of chronic cough.  Treat with continued carafate, prednisone course and codeine cough syrup.  Take zpack antibiotic to cover bacterial bronchitis.  Push fluids and rest.  Let us know if not improving with treatment.

## 2016-01-30 NOTE — Progress Notes (Signed)
BP 122/84 mmHg  Pulse 94  Temp(Src) 98 F (36.7 C) (Oral)  Wt 153 lb (69.4 kg)  SpO2 97%   CC: cough, body aches, malaise  Subjective:    Patient ID: Shelby Vazquez, female    DOB: 03/01/1951, 65 y.o.   MRN: [id number]  HPI: Shelby Vazquez is a 65 y.o. female presenting on 01/30/2016 for Cough, cold   Seen here 12/18/2015 with influenza, treated with tylenol, codeine cough syrup. Outside of window for tamiflu at that time.   1 wk h/o productive cough of colored sputum, body aches, throat pain from gland swelling and vocal cord swelling. Trouble sleeping 2/2 coughing fits. Some earache and recurrent headache. Mild chest congestion. + chills.  Has been taking delsym and codeine cough syrup. Treating with ibuprofen and tylenol, ranitidine, claritin. Also regularly takes aciphex.  No fevers, dyspnea or wheezing.   No sick contacts at home.  No smokers at home. No h/o asthma.   H/o cyclical cough exacerbated by current illness and GERD. She was started on carafate which helped throat burning.   Relevant past medical, surgical, family and social history reviewed and updated as indicated. Interim medical history since our last visit reviewed. Allergies and medications reviewed and updated. Current Outpatient Prescriptions on File Prior to Visit  Medication Sig  . acetaminophen (TYLENOL) 500 MG tablet Take 500 mg by mouth as needed.  . Calcium Carb-Cholecalciferol 600-800 MG-UNIT TABS Take 1 tablet by mouth daily.  . clidinium-chlordiazePOXIDE (LIBRAX) 5-2.5 MG capsule Take 1 capsule by mouth 2 (two) times daily.  . cyanocobalamin (,VITAMIN B-12,) 1000 MCG/ML injection inject 1 mL intramuscularly every 30 DAYS  . famotidine (PEPCID) 40 MG tablet Take 2 tablets (80 mg total) by mouth at bedtime.  Marland Kitchen loratadine (CLARITIN) 10 MG tablet Take 10 mg by mouth daily as needed for allergies.  Marland Kitchen ondansetron (ZOFRAN ODT) 4 MG disintegrating tablet Take 1 tablet (4 mg total) by mouth every 8  (eight) hours as needed for nausea or vomiting.  . Polyethylene Glycol 3350 (MIRALAX PO) Take 17 g by mouth daily as needed (constipation (in 8oz fluid)).   Marland Kitchen PRESCRIPTION MEDICATION Apply topically 2 (two) times daily. Estradiol Progesterone cream 0.05-30 mg/ml  . Probiotic Product (PROBIOTIC PO) Take 1 capsule by mouth daily.  . promethazine (PHENERGAN) 25 MG tablet Take 1 tablet (25 mg total) by mouth every 8 (eight) hours as needed for nausea or vomiting.  . RABEprazole (ACIPHEX) 20 MG tablet Take 20 mg by mouth daily.  . ranitidine (ZANTAC) 150 MG tablet Take 300 mg by mouth 2 (two) times daily.  . simvastatin (ZOCOR) 40 MG tablet Take 1 tablet (40 mg total) by mouth every evening.  . triamterene-hydrochlorothiazide (DYAZIDE) 37.5-25 MG capsule Take 1 each (1 capsule total) by mouth every morning.  . venlafaxine XR (EFFEXOR-XR) 75 MG 24 hr capsule take 2 capsule by mouth once daily WITH BREAKFAST   No current facility-administered medications on file prior to visit.    Review of Systems Per HPI unless specifically indicated in ROS section     Objective:    BP 122/84 mmHg  Pulse 94  Temp(Src) 98 F (36.7 C) (Oral)  Wt 153 lb (69.4 kg)  SpO2 97%  Wt Readings from Last 3 Encounters:  01/30/16 153 lb (69.4 kg)  12/18/15 154 lb 8 oz (70.081 kg)  08/16/15 154 lb 8 oz (70.081 kg)    Physical Exam  Constitutional: She appears well-developed and well-nourished. No distress.  HENT:  Head: Normocephalic and atraumatic.  Right Ear: Hearing, tympanic membrane, external ear and ear canal normal.  Left Ear: Hearing, tympanic membrane, external ear and ear canal normal.  Nose: Mucosal edema present. No rhinorrhea. Right sinus exhibits no maxillary sinus tenderness and no frontal sinus tenderness. Left sinus exhibits no maxillary sinus tenderness and no frontal sinus tenderness.  Mouth/Throat: Uvula is midline and mucous membranes are normal. Posterior oropharyngeal erythema present. No  oropharyngeal exudate, posterior oropharyngeal edema or tonsillar abscesses.  Mild congestion behind TMs L>R nasal mucosal congestion with drainage  Eyes: Conjunctivae and EOM are normal. Pupils are equal, round, and reactive to light. No scleral icterus.  Neck: Normal range of motion. Neck supple.  Cardiovascular: Normal rate, regular rhythm, normal heart sounds and intact distal pulses.   No murmur heard. Pulmonary/Chest: Effort normal and breath sounds normal. No respiratory distress. She has no wheezes. She has no rales.  Lungs clear, nagging cough present  Lymphadenopathy:    She has no cervical adenopathy.  Skin: Skin is warm and dry. No rash noted.  Nursing note and vitals reviewed.  Results for orders placed or performed in visit on 01/30/16  POC Influenza A&B(BINAX/QUICKVUE)  Result Value Ref Range   Influenza A, POC Negative Negative   Influenza B, POC Negative Negative      Assessment & Plan:   Problem List Items Addressed This Visit    GERD, SEVERE   Relevant Medications   sucralfate (CARAFATE) 1 GM/10ML suspension   Chronic cough - cyclical   Acute bronchitis - Primary    1 wk of worsening cough. Anticipate influenza like illness vs bronchitis. 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 Cover for atypical bronchitis with zpack. Update if not improving as expected. Pt agrees. Check flu swab today - negative.       Relevant Orders   POC Influenza A&B(BINAX/QUICKVUE) (Completed)       Follow up plan: Return if symptoms worsen or fail to improve.  Ria Bush, MD

## 2016-02-07 ENCOUNTER — Other Ambulatory Visit: Payer: Self-pay | Admitting: Family Medicine

## 2016-02-07 ENCOUNTER — Other Ambulatory Visit (INDEPENDENT_AMBULATORY_CARE_PROVIDER_SITE_OTHER): Payer: Medicare Other

## 2016-02-07 DIAGNOSIS — I10 Essential (primary) hypertension: Secondary | ICD-10-CM

## 2016-02-07 DIAGNOSIS — E538 Deficiency of other specified B group vitamins: Secondary | ICD-10-CM

## 2016-02-07 DIAGNOSIS — E785 Hyperlipidemia, unspecified: Secondary | ICD-10-CM

## 2016-02-07 DIAGNOSIS — Z1159 Encounter for screening for other viral diseases: Secondary | ICD-10-CM

## 2016-02-07 LAB — CBC WITH DIFFERENTIAL/PLATELET
BASOS ABS: 0 10*3/uL (ref 0.0–0.1)
BASOS PCT: 0.5 % (ref 0.0–3.0)
Eosinophils Absolute: 0.3 10*3/uL (ref 0.0–0.7)
Eosinophils Relative: 3.4 % (ref 0.0–5.0)
HEMATOCRIT: 40.9 % (ref 36.0–46.0)
Hemoglobin: 13.9 g/dL (ref 12.0–15.0)
LYMPHS ABS: 3.2 10*3/uL (ref 0.7–4.0)
LYMPHS PCT: 36 % (ref 12.0–46.0)
MCHC: 33.9 g/dL (ref 30.0–36.0)
MCV: 90.2 fl (ref 78.0–100.0)
MONOS PCT: 7.9 % (ref 3.0–12.0)
Monocytes Absolute: 0.7 10*3/uL (ref 0.1–1.0)
NEUTROS ABS: 4.6 10*3/uL (ref 1.4–7.7)
NEUTROS PCT: 52.2 % (ref 43.0–77.0)
PLATELETS: 380 10*3/uL (ref 150.0–400.0)
RBC: 4.54 Mil/uL (ref 3.87–5.11)
RDW: 13.9 % (ref 11.5–15.5)
WBC: 8.9 10*3/uL (ref 4.0–10.5)

## 2016-02-07 LAB — COMPREHENSIVE METABOLIC PANEL
ALT: 20 U/L (ref 0–35)
AST: 21 U/L (ref 0–37)
Albumin: 4.3 g/dL (ref 3.5–5.2)
Alkaline Phosphatase: 46 U/L (ref 39–117)
BILIRUBIN TOTAL: 0.4 mg/dL (ref 0.2–1.2)
BUN: 11 mg/dL (ref 6–23)
CHLORIDE: 99 meq/L (ref 96–112)
CO2: 32 meq/L (ref 19–32)
Calcium: 9.6 mg/dL (ref 8.4–10.5)
Creatinine, Ser: 0.98 mg/dL (ref 0.40–1.20)
GFR: 60.52 mL/min (ref >60.00)
GLUCOSE: 119 mg/dL — AB (ref 70–99)
Potassium: 3.7 mEq/L (ref 3.5–5.1)
Sodium: 138 mEq/L (ref 135–145)
Total Protein: 6.8 g/dL (ref 6.0–8.3)

## 2016-02-07 LAB — LIPID PANEL
CHOL/HDL RATIO: 3
Cholesterol: 141 mg/dL (ref 0–200)
HDL: 46.7 mg/dL (ref >39.00)
LDL CALC: 79 mg/dL (ref 0–99)
NONHDL: 94.72
Triglycerides: 79 mg/dL (ref 0.0–149.0)
VLDL: 15.8 mg/dL (ref 0.0–40.0)

## 2016-02-07 LAB — VITAMIN B12: Vitamin B-12: 570 pg/mL (ref 211–911)

## 2016-02-08 LAB — HEPATITIS C ANTIBODY: HCV AB: NEGATIVE

## 2016-02-12 ENCOUNTER — Encounter: Payer: Self-pay | Admitting: Family Medicine

## 2016-02-12 ENCOUNTER — Ambulatory Visit (INDEPENDENT_AMBULATORY_CARE_PROVIDER_SITE_OTHER): Payer: Medicare Other | Admitting: Family Medicine

## 2016-02-12 VITALS — BP 130/86 | HR 100 | Temp 98.2°F | Ht 64.5 in | Wt 151.8 lb

## 2016-02-12 DIAGNOSIS — Z23 Encounter for immunization: Secondary | ICD-10-CM | POA: Diagnosis not present

## 2016-02-12 DIAGNOSIS — Z7189 Other specified counseling: Secondary | ICD-10-CM | POA: Diagnosis not present

## 2016-02-12 DIAGNOSIS — R05 Cough: Secondary | ICD-10-CM

## 2016-02-12 DIAGNOSIS — M8580 Other specified disorders of bone density and structure, unspecified site: Secondary | ICD-10-CM

## 2016-02-12 DIAGNOSIS — E538 Deficiency of other specified B group vitamins: Secondary | ICD-10-CM

## 2016-02-12 DIAGNOSIS — R351 Nocturia: Secondary | ICD-10-CM | POA: Diagnosis not present

## 2016-02-12 DIAGNOSIS — K580 Irritable bowel syndrome with diarrhea: Secondary | ICD-10-CM

## 2016-02-12 DIAGNOSIS — Z0000 Encounter for general adult medical examination without abnormal findings: Secondary | ICD-10-CM | POA: Insufficient documentation

## 2016-02-12 DIAGNOSIS — K219 Gastro-esophageal reflux disease without esophagitis: Secondary | ICD-10-CM

## 2016-02-12 DIAGNOSIS — I700 Atherosclerosis of aorta: Secondary | ICD-10-CM

## 2016-02-12 DIAGNOSIS — R053 Chronic cough: Secondary | ICD-10-CM

## 2016-02-12 DIAGNOSIS — I10 Essential (primary) hypertension: Secondary | ICD-10-CM

## 2016-02-12 DIAGNOSIS — E785 Hyperlipidemia, unspecified: Secondary | ICD-10-CM

## 2016-02-12 LAB — POC URINALSYSI DIPSTICK (AUTOMATED)
BILIRUBIN UA: NEGATIVE
GLUCOSE UA: NEGATIVE
Ketones, UA: NEGATIVE
Leukocytes, UA: NEGATIVE
Nitrite, UA: NEGATIVE
PH UA: 7
Protein, UA: NEGATIVE
RBC UA: NEGATIVE
SPEC GRAV UA: 1.015
UROBILINOGEN UA: 0.2

## 2016-02-12 NOTE — Assessment & Plan Note (Signed)
Stable period.  

## 2016-02-12 NOTE — Patient Instructions (Addendum)
prevnar today EKG today Urine looked ok. Make sure you're staying well hydrated with water.  You are doing well today. Return as needed or in 1 year for next medicare wellness visit Advanced directive packet provided today.  Health Maintenance, Female Adopting a healthy lifestyle and getting preventive care can go a long way to promote health and wellness. Talk with your health care provider about what schedule of regular examinations is right for you. This is a good chance for you to check in with your provider about disease prevention and staying healthy. In between checkups, there are plenty of things you can do on your own. Experts have done a lot of research about which lifestyle changes and preventive measures are most likely to keep you healthy. Ask your health care provider for more information. WEIGHT AND DIET  Eat a healthy diet  Be sure to include plenty of vegetables, fruits, low-fat dairy products, and lean protein.  Do not eat a lot of foods high in solid fats, added sugars, or salt.  Get regular exercise. This is one of the most important things you can do for your health.  Most adults should exercise for at least 150 minutes each week. The exercise should increase your heart rate and make you sweat (moderate-intensity exercise).  Most adults should also do strengthening exercises at least twice a week. This is in addition to the moderate-intensity exercise.  Maintain a healthy weight  Body mass index (BMI) is a measurement that can be used to identify possible weight problems. It estimates body fat based on height and weight. Your health care provider can help determine your BMI and help you achieve or maintain a healthy weight.  For females 35 years of age and older:   A BMI below 18.5 is considered underweight.  A BMI of 18.5 to 24.9 is normal.  A BMI of 25 to 29.9 is considered overweight.  A BMI of 30 and above is considered obese.  Watch levels of  cholesterol and blood lipids  You should start having your blood tested for lipids and cholesterol at 65 years of age, then have this test every 5 years.  You may need to have your cholesterol levels checked more often if:  Your lipid or cholesterol levels are high.  You are older than 65 years of age.  You are at high risk for heart disease.  CANCER SCREENING   Lung Cancer  Lung cancer screening is recommended for adults 49-31 years old who are at high risk for lung cancer because of a history of smoking.  A yearly low-dose CT scan of the lungs is recommended for people who:  Currently smoke.  Have quit within the past 15 years.  Have at least a 30-pack-year history of smoking. A pack year is smoking an average of one pack of cigarettes a day for 1 year.  Yearly screening should continue until it has been 15 years since you quit.  Yearly screening should stop if you develop a health problem that would prevent you from having lung cancer treatment.  Breast Cancer  Practice breast self-awareness. This means understanding how your breasts normally appear and feel.  It also means doing regular breast self-exams. Let your health care provider know about any changes, no matter how small.  If you are in your 20s or 30s, you should have a clinical breast exam (CBE) by a health care provider every 1-3 years as part of a regular health exam.  If you are  68 or older, have a CBE every year. Also consider having a breast X-ray (mammogram) every year.  If you have a family history of breast cancer, talk to your health care provider about genetic screening.  If you are at high risk for breast cancer, talk to your health care provider about having an MRI and a mammogram every year.  Breast cancer gene (BRCA) assessment is recommended for women who have family members with BRCA-related cancers. BRCA-related cancers include:  Breast.  Ovarian.  Tubal.  Peritoneal  cancers.  Results of the assessment will determine the need for genetic counseling and BRCA1 and BRCA2 testing. Cervical Cancer Your health care provider may recommend that you be screened regularly for cancer of the pelvic organs (ovaries, uterus, and vagina). This screening involves a pelvic examination, including checking for microscopic changes to the surface of your cervix (Pap test). You may be encouraged to have this screening done every 3 years, beginning at age 74.  For women ages 39-65, health care providers may recommend pelvic exams and Pap testing every 3 years, or they may recommend the Pap and pelvic exam, combined with testing for human papilloma virus (HPV), every 5 years. Some types of HPV increase your risk of cervical cancer. Testing for HPV may also be done on women of any age with unclear Pap test results.  Other health care providers may not recommend any screening for nonpregnant women who are considered low risk for pelvic cancer and who do not have symptoms. Ask your health care provider if a screening pelvic exam is right for you.  If you have had past treatment for cervical cancer or a condition that could lead to cancer, you need Pap tests and screening for cancer for at least 20 years after your treatment. If Pap tests have been discontinued, your risk factors (such as having a new sexual partner) need to be reassessed to determine if screening should resume. Some women have medical problems that increase the chance of getting cervical cancer. In these cases, your health care provider may recommend more frequent screening and Pap tests. Colorectal Cancer  This type of cancer can be detected and often prevented.  Routine colorectal cancer screening usually begins at 65 years of age and continues through 65 years of age.  Your health care provider may recommend screening at an earlier age if you have risk factors for colon cancer.  Your health care provider may also  recommend using home test kits to check for hidden blood in the stool.  A small camera at the end of a tube can be used to examine your colon directly (sigmoidoscopy or colonoscopy). This is done to check for the earliest forms of colorectal cancer.  Routine screening usually begins at age 84.  Direct examination of the colon should be repeated every 5-10 years through 65 years of age. However, you may need to be screened more often if early forms of precancerous polyps or small growths are found. Skin Cancer  Check your skin from head to toe regularly.  Tell your health care provider about any new moles or changes in moles, especially if there is a change in a mole's shape or color.  Also tell your health care provider if you have a mole that is larger than the size of a pencil eraser.  Always use sunscreen. Apply sunscreen liberally and repeatedly throughout the day.  Protect yourself by wearing long sleeves, pants, a wide-brimmed hat, and sunglasses whenever you are outside. HEART  DISEASE, DIABETES, AND HIGH BLOOD PRESSURE   High blood pressure causes heart disease and increases the risk of stroke. High blood pressure is more likely to develop in:  People who have blood pressure in the high end of the normal range (130-139/85-89 mm Hg).  People who are overweight or obese.  People who are African American.  If you are 42-72 years of age, have your blood pressure checked every 3-5 years. If you are 36 years of age or older, have your blood pressure checked every year. You should have your blood pressure measured twice--once when you are at a hospital or clinic, and once when you are not at a hospital or clinic. Record the average of the two measurements. To check your blood pressure when you are not at a hospital or clinic, you can use:  An automated blood pressure machine at a pharmacy.  A home blood pressure monitor.  If you are between 58 years and 53 years old, ask your health  care provider if you should take aspirin to prevent strokes.  Have regular diabetes screenings. This involves taking a blood sample to check your fasting blood sugar level.  If you are at a normal weight and have a low risk for diabetes, have this test once every three years after 65 years of age.  If you are overweight and have a high risk for diabetes, consider being tested at a younger age or more often. PREVENTING INFECTION  Hepatitis B  If you have a higher risk for hepatitis B, you should be screened for this virus. You are considered at high risk for hepatitis B if:  You were born in a country where hepatitis B is common. Ask your health care provider which countries are considered high risk.  Your parents were born in a high-risk country, and you have not been immunized against hepatitis B (hepatitis B vaccine).  You have HIV or AIDS.  You use needles to inject street drugs.  You live with someone who has hepatitis B.  You have had sex with someone who has hepatitis B.  You get hemodialysis treatment.  You take certain medicines for conditions, including cancer, organ transplantation, and autoimmune conditions. Hepatitis C  Blood testing is recommended for:  Everyone born from [birth year] through 1965.  Anyone with known risk factors for hepatitis C. Sexually transmitted infections (STIs)  You should be screened for sexually transmitted infections (STIs) including gonorrhea and chlamydia if:  You are sexually active and are younger than 65 years of age.  You are older than 65 years of age and your health care provider tells you that you are at risk for this type of infection.  Your sexual activity has changed since you were last screened and you are at an increased risk for chlamydia or gonorrhea. Ask your health care provider if you are at risk.  If you do not have HIV, but are at risk, it may be recommended that you take a prescription medicine daily to prevent HIV  infection. This is called pre-exposure prophylaxis (PrEP). You are considered at risk if:  You are sexually active and do not regularly use condoms or know the HIV status of your partner(s).  You take drugs by injection.  You are sexually active with a partner who has HIV. Talk with your health care provider about whether you are at high risk of being infected with HIV. If you choose to begin PrEP, you should first be tested for HIV. You should  then be tested every 3 months for as long as you are taking PrEP.  PREGNANCY   If you are premenopausal and you may become pregnant, ask your health care provider about preconception counseling.  If you may become pregnant, take 400 to 800 micrograms (mcg) of folic acid every day.  If you want to prevent pregnancy, talk to your health care provider about birth control (contraception). OSTEOPOROSIS AND MENOPAUSE   Osteoporosis is a disease in which the bones lose minerals and strength with aging. This can result in serious bone fractures. Your risk for osteoporosis can be identified using a bone density scan.  If you are 32 years of age or older, or if you are at risk for osteoporosis and fractures, ask your health care provider if you should be screened.  Ask your health care provider whether you should take a calcium or vitamin D supplement to lower your risk for osteoporosis.  Menopause may have certain physical symptoms and risks.  Hormone replacement therapy may reduce some of these symptoms and risks. Talk to your health care provider about whether hormone replacement therapy is right for you.  HOME CARE INSTRUCTIONS   Schedule regular health, dental, and eye exams.  Stay current with your immunizations.   Do not use any tobacco products including cigarettes, chewing tobacco, or electronic cigarettes.  If you are pregnant, do not drink alcohol.  If you are breastfeeding, limit how much and how often you drink alcohol.  Limit  alcohol intake to no more than 1 drink per day for nonpregnant women. One drink equals 12 ounces of beer, 5 ounces of wine, or 1 ounces of hard liquor.  Do not use street drugs.  Do not share needles.  Ask your health care provider for help if you need support or information about quitting drugs.  Tell your health care provider if you often feel depressed.  Tell your health care provider if you have ever been abused or do not feel safe at home.   This information is not intended to replace advice given to you by your health care provider. Make sure you discuss any questions you have with your health care provider.   Document Released: 04/22/2011 Document Revised: 10/28/2014 Document Reviewed: 09/08/2013 Elsevier Interactive Patient Education [employer].

## 2016-02-12 NOTE — Assessment & Plan Note (Signed)
Chronic, stable. Continue maxzide. 

## 2016-02-12 NOTE — Assessment & Plan Note (Signed)
Doing well on aciphex, pepcid/zantac

## 2016-02-12 NOTE — Assessment & Plan Note (Signed)
b12 level ok - continue 1059mcg injection monthly

## 2016-02-12 NOTE — Progress Notes (Signed)
Pre visit review using our clinic review tool, if applicable. No additional management support is needed unless otherwise documented below in the visit note. 

## 2016-02-12 NOTE — Assessment & Plan Note (Signed)
Continue statin. consider aspirin.

## 2016-02-12 NOTE — Assessment & Plan Note (Signed)
Stable period - doing well on librax, has noted marked improvement with commencement of effexor XR.

## 2016-02-12 NOTE — Addendum Note (Signed)
Addended by: Ria Bush on: 02/12/2016 01:56 PM   Modules accepted: Miquel Dunn

## 2016-02-12 NOTE — Assessment & Plan Note (Addendum)
I have personally reviewed the Medicare Annual Wellness questionnaire and have noted 1. The patient's medical and social history 2. Their use of alcohol, tobacco or illicit drugs 3. Their current medications and supplements 4. The patient's functional ability including ADL's, fall risks, home safety risks and hearing or visual impairment. Cognitive function has been assessed and addressed as indicated.  5. Diet and physical activity 6. Evidence for depression or mood disorders The patients weight, height, BMI have been recorded in the chart. I have made referrals, counseling and provided education to the patient based on review of the above and I have provided the pt with a written personalized care plan for preventive services. Provider list updated.. See scanned questionairre as needed for further documentation. Reviewed preventative protocols and updated unless pt declined.   EKG - NSR rate 80s, normal axis, intervals, no acute ST/T changes

## 2016-02-12 NOTE — Assessment & Plan Note (Signed)
Chronic, stable on simvastatin - continue.

## 2016-02-12 NOTE — Addendum Note (Signed)
Addended by: Royann Shivers A on: 02/12/2016 02:52 PM   Modules accepted: Orders

## 2016-02-12 NOTE — Progress Notes (Addendum)
BP 130/86 mmHg  Pulse 100  Temp(Src) 98.2 F (36.8 C) (Oral)  Ht 5' 4.5" (1.638 m)  Wt 151 lb 12 oz (68.833 kg)  BMI 25.65 kg/m2   CC: welcome to medicare visit  Subjective:    Patient ID: Shelby Vazquez, female    DOB: [birth year], 65 y.o.   MRN: [id number]  HPI: Shelby Vazquez is a 65 y.o. female presenting on 02/12/2016 for Annual Exam   Seen here 01/20/2016 with acute bronchitis concern for flare of cyclic cough syndrome - treated with carafate, prednisone course, zpack. sxs improved.   IBS - significant improvement since effexor XR started.   Noticed mild urinary frequency and nocturia. H/o bladder prolapse, wears pessary.   Hearing - passed Vision - with eye doctor [place] - several mechanical falls after bunionectomy due to poor residual foot anatomy (needs to wear different size shoe) Depression - passed  Preventative: COLONOSCOPY WITH PROPOFOL Laterality: N/A Date: 03/31/2014 sessile serrated adenomas, sigmoid diverticulosis, rpt 3 yrs Cleotis Nipper, MD) ESOPHAGOGASTRODUODENOSCOPY Laterality: N/A Date: 03/31/2014 mult gastric polyps, mild mucosal hemorrhages; Cleotis Nipper, MD Well woman with Dr. 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 sees yearly in August. DEXA 01/23/2015 osteopenia T -1.7 (with Dr Helane Rima) - rec rpt 2 yrs. Mammogram 05/2015 WNL Flu shot - yearly Pneumovax 01/2013, prevnar today Td 2011 zostavax 04/2013 Advanced directives: does not have this set up. Packet provided today. HCPOA would be daughter [name].  Seat belt use discussed Sunscreen use discussed, no changing moles on skin  Married 1973. 2 daughters '74, '80; 5 grandchildren.  No h/o physical or sexual abuse. 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  Edu: [employer] Activity: really enjoys hiking, now training for 5k Diet: good water, fruits/vegetables daily  Relevant past medical, surgical, family  and social history reviewed and updated as indicated. Interim medical history since our last visit reviewed. Allergies and medications reviewed and updated. Current Outpatient Prescriptions on File Prior to Visit  Medication Sig  . acetaminophen (TYLENOL) 500 MG tablet Take 500 mg by mouth as needed.  . Calcium Carb-Cholecalciferol 600-800 MG-UNIT TABS Take 1 tablet by mouth daily.  . clidinium-chlordiazePOXIDE (LIBRAX) 5-2.5 MG capsule Take 1 capsule by mouth 2 (two) times daily.  . cyanocobalamin (,VITAMIN B-12,) 1000 MCG/ML injection inject 1 mL intramuscularly every 30 DAYS  . famotidine (PEPCID) 40 MG tablet Take 2 tablets (80 mg total) by mouth at bedtime.  Marland Kitchen loratadine (CLARITIN) 10 MG tablet Take 10 mg by mouth daily as needed for allergies.  Marland Kitchen ondansetron (ZOFRAN ODT) 4 MG disintegrating tablet Take 1 tablet (4 mg total) by mouth every 8 (eight) hours as needed for nausea or vomiting.  . Polyethylene Glycol 3350 (MIRALAX PO) Take 17 g by mouth daily as needed (constipation (in 8oz fluid)).   Marland Kitchen PRESCRIPTION MEDICATION Apply topically 2 (two) times daily. Estradiol Progesterone cream 0.05-30 mg/ml  . Probiotic Product (PROBIOTIC PO) Take 1 capsule by mouth daily.  . promethazine (PHENERGAN) 25 MG tablet Take 1 tablet (25 mg total) by mouth every 8 (eight) hours as needed for nausea or vomiting.  . RABEprazole (ACIPHEX) 20 MG tablet Take 20 mg by mouth daily.  . ranitidine (ZANTAC) 150 MG tablet Take 300 mg by mouth 2 (two) times daily.  . simvastatin (ZOCOR) 40 MG tablet Take 1 tablet (40 mg total) by mouth every evening.  . sucralfate (CARAFATE) 1 GM/10ML suspension Take 10 mLs (  1 g total) by mouth 4 (four) times daily as needed.  . triamterene-hydrochlorothiazide (DYAZIDE) 37.5-25 MG capsule Take 1 each (1 capsule total) by mouth every morning.  . venlafaxine XR (EFFEXOR-XR) 75 MG 24 hr capsule take 2 capsule by mouth once daily WITH BREAKFAST   No current facility-administered  medications on file prior to visit.    Review of Systems  Constitutional: Negative for fever, chills, activity change, appetite change, fatigue and unexpected weight change.  HENT: Negative for hearing loss.   Eyes: Negative for visual disturbance.  Respiratory: Positive for cough. Negative for chest tightness, shortness of breath and wheezing.   Cardiovascular: Negative for chest pain, palpitations and leg swelling.  Gastrointestinal: Negative for nausea, vomiting, abdominal pain, diarrhea, constipation, blood in stool and abdominal distention.       GI issues markedly better on effexor this year  Genitourinary: Negative for hematuria and difficulty urinating.  Musculoskeletal: Negative for myalgias, arthralgias and neck pain.  Skin: Negative for rash.  Neurological: Negative for dizziness, seizures, syncope and headaches.  Hematological: Negative for adenopathy. Does not bruise/bleed easily.  Psychiatric/Behavioral: Negative for dysphoric mood. The patient is not nervous/anxious.    Per HPI unless specifically indicated in ROS section     Objective:    BP 130/86 mmHg  Pulse 100  Temp(Src) 98.2 F (36.8 C) (Oral)  Ht 5' 4.5" (1.638 m)  Wt 151 lb 12 oz (68.833 kg)  BMI 25.65 kg/m2  Wt Readings from Last 3 Encounters:  02/12/16 151 lb 12 oz (68.833 kg)  01/30/16 153 lb (69.4 kg)  12/18/15 154 lb 8 oz (70.081 kg)    Physical Exam  Constitutional: She is oriented to person, place, and time. She appears well-developed and well-nourished. No distress.  HENT:  Head: Normocephalic and atraumatic.  Right Ear: Hearing, tympanic membrane, external ear and ear canal normal.  Left Ear: Hearing, tympanic membrane, external ear and ear canal normal.  Nose: Nose normal.  Mouth/Throat: Uvula is midline, oropharynx is clear and moist and mucous membranes are normal. No oropharyngeal exudate, posterior oropharyngeal edema or posterior oropharyngeal erythema.  Eyes: Conjunctivae and EOM are  normal. Pupils are equal, round, and reactive to light. No scleral icterus.  Neck: Normal range of motion. Neck supple. Carotid bruit is not present. No thyromegaly present.  Cardiovascular: Normal rate, regular rhythm, normal heart sounds and intact distal pulses.   No murmur heard. Pulses:      Radial pulses are 2+ on the right side, and 2+ on the left side.  Pulmonary/Chest: Effort normal and breath sounds normal. No respiratory distress. She has no wheezes. She has no rales.  Abdominal: Soft. Bowel sounds are normal. She exhibits no distension and no mass. There is no tenderness. There is no rebound and no guarding.  Musculoskeletal: Normal range of motion. She exhibits no edema.  Lymphadenopathy:    She has no cervical adenopathy.  Neurological: She is alert and oriented to person, place, and time.  CN grossly intact, station and gait intact Recall 3/3 Calculation 5/5 serial 7s   Skin: Skin is warm and dry. No rash noted.  Psychiatric: She has a normal mood and affect. Her behavior is normal. Judgment and thought content normal.  Nursing note and vitals reviewed.  Results for orders placed or performed in visit on 02/12/16  HM MAMMOGRAPHY  Result Value Ref Range   HM Mammogram Self Reported Normal 0-4 Bi-Rad, Self Reported Normal  POCT Urinalysis Dipstick (Automated)  Result Value Ref Range  Color, UA Yellow    Clarity, UA Clear    Glucose, UA Negative    Bilirubin, UA Negative    Ketones, UA Negative    Spec Grav, UA 1.015    Blood, UA Negative    pH, UA 7.0    Protein, UA Negative    Urobilinogen, UA 0.2    Nitrite, UA Negative    Leukocytes, UA Negative Negative      Assessment & Plan:   Problem List Items Addressed This Visit    B12 deficiency    b12 level ok - continue 1078mcg injection monthly      HLD (hyperlipidemia)    Chronic, stable on simvastatin - continue.      Essential hypertension    Chronic, stable. Continue maxzide.       GERD, SEVERE     Doing well on aciphex, pepcid/zantac      IBS    Stable period - doing well on librax, has noted marked improvement with commencement of effexor XR.      Osteopenia    Continue cal/vit D. Followed by GYN.      Chronic cough - cyclical    Stable period.      Abdominal aortic atherosclerosis (HCC)    Continue statin. consider aspirin.       Advanced care planning/counseling discussion    Advanced directives: does not have this set up. Packet provided today. HCPOA would be daughter [name].       Welcome to Medicare preventive visit - Primary    I have personally reviewed the Medicare Annual Wellness questionnaire and have noted 1. The patient's medical and social history 2. Their use of alcohol, tobacco or illicit drugs 3. Their current medications and supplements 4. The patient's functional ability including ADL's, fall risks, home safety risks and hearing or visual impairment. Cognitive function has been assessed and addressed as indicated.  5. Diet and physical activity 6. Evidence for depression or mood disorders The patients weight, height, BMI have been recorded in the chart. I have made referrals, counseling and provided education to the patient based on review of the above and I have provided the pt with a written personalized care plan for preventive services. Provider list updated.. See scanned questionairre as needed for further documentation. Reviewed preventative protocols and updated unless pt declined.   EKG - NSR rate 80s, normal axis, intervals, no acute ST/T changes      Relevant Orders   EKG 12-Lead (Completed)    Other Visit Diagnoses    Nocturia        Relevant Orders    POCT Urinalysis Dipstick (Automated) (Completed)        Follow up plan: Return in about 1 year (around 02/11/2017), or as needed, for medicare wellness visit.  Ria Bush, MD

## 2016-02-12 NOTE — Assessment & Plan Note (Signed)
Advanced directives: does not have this set up. Packet provided today. HCPOA would be daughter Shelby Vazquez.

## 2016-02-12 NOTE — Assessment & Plan Note (Signed)
Continue cal/vit D. Followed by GYN.

## 2016-03-23 ENCOUNTER — Other Ambulatory Visit: Payer: Self-pay | Admitting: Family Medicine

## 2016-03-25 NOTE — Telephone Encounter (Signed)
Pt request status of effexor refill; advised already done. Pt voiced understanding.

## 2016-05-09 ENCOUNTER — Other Ambulatory Visit: Payer: Self-pay | Admitting: *Deleted

## 2016-05-09 NOTE — Telephone Encounter (Signed)
Ok to refill? Will need written Rx for mail order.

## 2016-05-10 MED ORDER — CILIDINIUM-CHLORDIAZEPOXIDE 2.5-5 MG PO CAPS: 1.0000 | ORAL_CAPSULE | Freq: Two times a day (BID) | ORAL | Status: DC

## 2016-05-10 NOTE — Telephone Encounter (Signed)
Printed and in Kim's box 

## 2016-05-10 NOTE — Telephone Encounter (Signed)
Rx faxed to mail order 

## 2016-06-05 ENCOUNTER — Encounter: Payer: Self-pay | Admitting: Family Medicine

## 2016-06-05 ENCOUNTER — Ambulatory Visit (INDEPENDENT_AMBULATORY_CARE_PROVIDER_SITE_OTHER): Payer: Medicare Other | Admitting: Family Medicine

## 2016-06-05 VITALS — BP 118/82 | HR 68 | Temp 98.1°F | Wt 146.8 lb

## 2016-06-05 DIAGNOSIS — M79641 Pain in right hand: Secondary | ICD-10-CM | POA: Diagnosis not present

## 2016-06-05 DIAGNOSIS — M79642 Pain in left hand: Secondary | ICD-10-CM | POA: Insufficient documentation

## 2016-06-05 LAB — CBC WITH DIFFERENTIAL/PLATELET
BASOS PCT: 0.4 % (ref 0.0–3.0)
Basophils Absolute: 0 10*3/uL (ref 0.0–0.1)
EOS PCT: 2.1 % (ref 0.0–5.0)
Eosinophils Absolute: 0.1 10*3/uL (ref 0.0–0.7)
HEMATOCRIT: 40.1 % (ref 36.0–46.0)
HEMOGLOBIN: 13.7 g/dL (ref 12.0–15.0)
LYMPHS PCT: 41.4 % (ref 12.0–46.0)
Lymphs Abs: 2.7 10*3/uL (ref 0.7–4.0)
MCHC: 34 g/dL (ref 30.0–36.0)
MCV: 88.8 fl (ref 78.0–100.0)
MONOS PCT: 7.4 % (ref 3.0–12.0)
Monocytes Absolute: 0.5 10*3/uL (ref 0.1–1.0)
Neutro Abs: 3.1 10*3/uL (ref 1.4–7.7)
Neutrophils Relative %: 48.7 % (ref 43.0–77.0)
Platelets: 330 10*3/uL (ref 150.0–400.0)
RBC: 4.52 Mil/uL (ref 3.87–5.11)
RDW: 13.5 % (ref 11.5–15.5)
WBC: 6.5 10*3/uL (ref 4.0–10.5)

## 2016-06-05 LAB — SEDIMENTATION RATE: Sed Rate: 4 mm/hr (ref 0–30)

## 2016-06-05 NOTE — Patient Instructions (Signed)
Labs today to evaluate for rheumatoid arthritis.  We will be in touch with results and plan.

## 2016-06-05 NOTE — Progress Notes (Signed)
Pre visit review using our clinic review tool, if applicable. No additional management support is needed unless otherwise documented below in the visit note. 

## 2016-06-05 NOTE — Progress Notes (Signed)
BP 118/82   Pulse 68   Temp 98.1 F (36.7 C) (Oral)   Wt 146 lb 12 oz (66.6 kg)   BMI 24.80 kg/m    CC: bilateral hand pains. Subjective:    Patient ID: Shelby Vazquez, female    DOB: 05/06/1951, 64 y.o.   MRN: [id number]  HPI: Shelby Vazquez is a 65 y.o. female presenting on 06/05/2016 for Hand Pain (joints in both hands)   Worsening hand pains at knuckles worse in the morning over last 2 months. Some weakness noted with grasping. Morning stiffness. R ring finger locks up. Aggravated after lots of packing and moving earlier this year. Denies redness/swelling/warmth of joints. No other joint pains currently.   Treating with ibuprofen 400-61m BID which hasn't really helped. Tried Emu cream. Also using honey/apple cider in warm water.   Endorses mother had undiagnosed RA with hand deformities.  H/o severe GERD.  No known h/o arthritis otherwise.   Relevant past medical, surgical, family and social history reviewed and updated as indicated. Interim medical history since our last visit reviewed. Allergies and medications reviewed and updated. Current Outpatient Prescriptions on File Prior to Visit  Medication Sig  . acetaminophen (TYLENOL) 500 MG tablet Take 500 mg by mouth as needed.  . Calcium Carb-Cholecalciferol 600-800 MG-UNIT TABS Take 1 tablet by mouth daily.  . clidinium-chlordiazePOXIDE (LIBRAX) 5-2.5 MG capsule Take 1 capsule by mouth 2 (two) times daily.  . cyanocobalamin (,VITAMIN B-12,) 1000 MCG/ML injection inject 1 mL intramuscularly every 30 DAYS  . famotidine (PEPCID) 40 MG tablet Take 2 tablets (80 mg total) by mouth at bedtime.  .Marland Kitchenloratadine (CLARITIN) 10 MG tablet Take 10 mg by mouth daily as needed for allergies.  .Marland Kitchenondansetron (ZOFRAN ODT) 4 MG disintegrating tablet Take 1 tablet (4 mg total) by mouth every 8 (eight) hours as needed for nausea or vomiting.  . Polyethylene Glycol 3350 (MIRALAX PO) Take 17 g by mouth daily as needed (constipation (in 8oz  fluid)).   .Marland KitchenPRESCRIPTION MEDICATION Apply topically 2 (two) times daily. Estradiol Progesterone cream 0.05-30 mg/ml  . Probiotic Product (PROBIOTIC PO) Take 1 capsule by mouth daily.  . promethazine (PHENERGAN) 25 MG tablet Take 1 tablet (25 mg total) by mouth every 8 (eight) hours as needed for nausea or vomiting.  . RABEprazole (ACIPHEX) 20 MG tablet Take 20 mg by mouth daily.  . ranitidine (ZANTAC) 150 MG tablet Take 300 mg by mouth 2 (two) times daily.  . simvastatin (ZOCOR) 40 MG tablet Take 1 tablet (40 mg total) by mouth every evening.  . sucralfate (CARAFATE) 1 GM/10ML suspension Take 10 mLs (1 g total) by mouth 4 (four) times daily as needed.  . triamterene-hydrochlorothiazide (DYAZIDE) 37.5-25 MG capsule Take 1 each (1 capsule total) by mouth every morning.  . venlafaxine XR (EFFEXOR-XR) 75 MG 24 hr capsule take 2 capsules by mouth once daily WITH BREAKFAST   No current facility-administered medications on file prior to visit.     Review of Systems Per HPI unless specifically indicated in ROS section     Objective:    BP 118/82   Pulse 68   Temp 98.1 F (36.7 C) (Oral)   Wt 146 lb 12 oz (66.6 kg)   BMI 24.80 kg/m   Wt Readings from Last 3 Encounters:  06/05/16 146 lb 12 oz (66.6 kg)  02/12/16 151 lb 12 oz (68.8 kg)  01/30/16 153 lb (69.4 kg)    Physical Exam  Constitutional: She appears  well-developed and well-nourished. No distress.  Musculoskeletal: She exhibits no edema.  Tender to palpation at R 1st IP and R 2nd MCP without active synovitis.   Neurological:  Grip strength intact  Skin: Skin is warm and dry. No rash noted. No erythema.  Psychiatric: She has a normal mood and affect.  Nursing note and vitals reviewed.     Assessment & Plan:   Problem List Items Addressed This Visit    Bilateral hand pain - Primary    With endorsed fmhx and am stiffness will check for RA with RF, CCP, ESR.  If normal, anticipate osteoarthritis related discomfort and will  recommend tylenol, topical NSAID, and glucosamine.  Discussed NSAID use in setting of severe GERD. Tylenol not as effective.       Relevant Orders   CBC with Differential/Platelet   Sedimentation rate   Rheumatoid factor   Cyclic citrul peptide antibody, IgG    Other Visit Diagnoses   None.      Follow up plan: Return if symptoms worsen or fail to improve.  Ria Bush, MD

## 2016-06-05 NOTE — Assessment & Plan Note (Signed)
With endorsed fmhx and am stiffness will check for RA with RF, CCP, ESR.  If normal, anticipate osteoarthritis related discomfort and will recommend tylenol, topical NSAID, and glucosamine.  Discussed NSAID use in setting of severe GERD. Tylenol not as effective.

## 2016-06-06 LAB — RHEUMATOID FACTOR

## 2016-06-06 LAB — CYCLIC CITRUL PEPTIDE ANTIBODY, IGG

## 2016-07-10 LAB — HM PAP SMEAR: HM Pap smear: NORMAL

## 2016-07-15 LAB — HM MAMMOGRAPHY

## 2016-07-26 ENCOUNTER — Encounter: Payer: Self-pay | Admitting: Family Medicine

## 2016-07-26 DIAGNOSIS — R339 Retention of urine, unspecified: Secondary | ICD-10-CM | POA: Insufficient documentation

## 2016-07-26 DIAGNOSIS — R32 Unspecified urinary incontinence: Secondary | ICD-10-CM

## 2016-07-26 DIAGNOSIS — N814 Uterovaginal prolapse, unspecified: Secondary | ICD-10-CM

## 2016-09-01 ENCOUNTER — Encounter: Payer: Self-pay | Admitting: Family Medicine

## 2016-09-03 DIAGNOSIS — N3281 Overactive bladder: Secondary | ICD-10-CM | POA: Insufficient documentation

## 2016-10-08 DIAGNOSIS — R339 Retention of urine, unspecified: Secondary | ICD-10-CM | POA: Insufficient documentation

## 2016-10-21 HISTORY — PX: INTERSTIM IMPLANT PLACEMENT: SHX5130

## 2016-12-21 ENCOUNTER — Other Ambulatory Visit: Payer: Self-pay | Admitting: Family Medicine

## 2017-01-01 ENCOUNTER — Other Ambulatory Visit: Payer: Self-pay | Admitting: *Deleted

## 2017-01-01 NOTE — Telephone Encounter (Signed)
Ok to refill? Last filled 05/10/16 #180 1RF. Will need written Rx for mail order

## 2017-01-02 MED ORDER — CILIDINIUM-CHLORDIAZEPOXIDE 2.5-5 MG PO CAPS: 1.0000 | ORAL_CAPSULE | Freq: Two times a day (BID) | ORAL | 1 refills | Status: DC

## 2017-01-02 NOTE — Telephone Encounter (Signed)
Rx faxed to [facility]

## 2017-01-02 NOTE — Telephone Encounter (Signed)
Printed and in Kmi's box. 

## 2017-01-15 ENCOUNTER — Emergency Department (HOSPITAL_COMMUNITY): Payer: Medicare Other

## 2017-01-15 ENCOUNTER — Emergency Department (HOSPITAL_COMMUNITY)
Admission: EM | Admit: 2017-01-15 | Discharge: 2017-01-15 | Disposition: A | Discharge status: Home / Self Care | Payer: Medicare Other | Attending: Emergency Medicine | Admitting: Emergency Medicine

## 2017-01-15 ENCOUNTER — Encounter (HOSPITAL_COMMUNITY): Payer: Self-pay | Admitting: Emergency Medicine

## 2017-01-15 ENCOUNTER — Telehealth: Payer: Self-pay | Admitting: Family Medicine

## 2017-01-15 DIAGNOSIS — S060X0A Concussion without loss of consciousness, initial encounter: Secondary | ICD-10-CM | POA: Diagnosis not present

## 2017-01-15 DIAGNOSIS — Y939 Activity, unspecified: Secondary | ICD-10-CM | POA: Diagnosis not present

## 2017-01-15 DIAGNOSIS — S0033XA Contusion of nose, initial encounter: Secondary | ICD-10-CM | POA: Insufficient documentation

## 2017-01-15 DIAGNOSIS — Y929 Unspecified place or not applicable: Secondary | ICD-10-CM | POA: Diagnosis not present

## 2017-01-15 DIAGNOSIS — S0990XA Unspecified injury of head, initial encounter: Secondary | ICD-10-CM | POA: Diagnosis present

## 2017-01-15 DIAGNOSIS — I10 Essential (primary) hypertension: Secondary | ICD-10-CM | POA: Insufficient documentation

## 2017-01-15 DIAGNOSIS — Y999 Unspecified external cause status: Secondary | ICD-10-CM | POA: Insufficient documentation

## 2017-01-15 DIAGNOSIS — Z79899 Other long term (current) drug therapy: Secondary | ICD-10-CM | POA: Insufficient documentation

## 2017-01-15 DIAGNOSIS — W06XXXA Fall from bed, initial encounter: Secondary | ICD-10-CM | POA: Insufficient documentation

## 2017-01-15 DIAGNOSIS — S0083XA Contusion of other part of head, initial encounter: Secondary | ICD-10-CM | POA: Insufficient documentation

## 2017-01-15 HISTORY — DX: Disorder of kidney and ureter, unspecified: N28.9

## 2017-01-15 MED ORDER — ACETAMINOPHEN 325 MG PO TABS
650.0000 mg | ORAL_TABLET | Freq: Once | ORAL | Status: AC
  Administered 2017-01-15: 650 mg via ORAL
  Filled 2017-01-15: qty 2

## 2017-01-15 MED ORDER — ONDANSETRON 4 MG PO TBDP: 4.0000 mg | ORAL_TABLET | Freq: Three times a day (TID) | ORAL | 0 refills | Status: DC | PRN

## 2017-01-15 MED ORDER — KETOROLAC TROMETHAMINE 60 MG/2ML IM SOLN
60.0000 mg | Freq: Once | INTRAMUSCULAR | Status: AC
  Administered 2017-01-15: 60 mg via INTRAMUSCULAR
  Filled 2017-01-15: qty 2

## 2017-01-15 NOTE — ED Provider Notes (Signed)
[facility] DEPT Provider Note   CSN: [id number] Arrival date & time: 01/15/17  1607     History   Chief Complaint Chief Complaint  Patient presents with  . Fall  . Head Injury    HPI Shelby Vazquez is a 66 y.o. female.  The history is provided by the patient and medical records.  Fall  Associated symptoms include headaches.  Head Injury      66 y.o. F with hx of GERD, chronic cough, B12 def, IBS, osteopenia, history of frequent UTIs, presenting to the ED after a fall.  Patient reports Friday morning (5 days ago) she was getting out of bed to use the bathroom when she tripped over her own feet and fell. States she slid across the floor and impacted her head against the wall. She did leave a rather large dent in the wall but she did not lose consciousness. States she was able to get up and walk, stairs and get herself an ice pack and go back to bed. States since this time she has had ongoing headache and some nausea. States today around 10 AM headache got a lot worse. She has not had any recurrent falls or trauma. She's not had any active emesis. She's not had any periods of confusion that are out of the ordinary for her (states she has memory issues at baseline).  She's not currently on anticoagulation. She has no history of severe head trauma in the past. Has been taking Motrin for her headache with some mild improvement.  Past Medical History:  Diagnosis Date  . Abdominal aortic atherosclerosis ([place]) 10/2014   by CT scan  . B12 deficiency   . Chronic anxiety   . Chronic cough    cyclical cough syndrome  . Complication of anesthesia 9 yrs ago   awake and in severe pain with colonoscopy  . GERD (gastroesophageal reflux disease)    severe leading to chronic cough and hoarseness  . History of UTI   . Hyperlipidemia   . Hypertension   . IBS (irritable bowel syndrome)    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, PB-8 probiotic, healthy diet and exercise  . 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 Osteopenia 01/2015   T -0.8 spine, -1.7 hip  . Renal disorder    UTI  . Uterine prolapse   . Vocal cord paralysis, bilateral partial 2001   evaluated at [facility] - did not require surgery    Patient Active Problem List   Diagnosis Date Noted  . Urinary incontinence 07/26/2016  . Bilateral hand pain 06/05/2016  . Advanced care planning/counseling discussion 02/12/2016  . Welcome to Medicare preventive visit 02/12/2016  . Left foot pain 08/16/2015  . Iron deficiency 02/06/2015  . Abdominal aortic atherosclerosis ([place]) 10/21/2014  . Lower abdominal pain 09/07/2014  . Trochanteric bursitis of left hip 02/09/2014  . Chronic cough - cyclical 65/46/5035  . Routine health maintenance 01/20/2012  . MDD (major depressive disorder), recurrent episode, moderate ([place]) 11/06/2011  . Itching 11/06/2011  . Headache in front of head 06/12/2011  . B12 deficiency 11/19/2010  . HLD (hyperlipidemia) 11/19/2010  . GAD (generalized anxiety disorder) 11/19/2010  . Essential hypertension 11/19/2010  . GERD, SEVERE 11/19/2010  . IBS 11/19/2010  . Uterine prolapse 11/19/2010  . Osteopenia 11/19/2010  . PERSONAL HISTORY OF TROPHOBLASTIC DISEASE 11/19/2010    Past Surgical History:  Procedure Laterality Date  . BUNIONECTOMY  [place] '2000  . CATARACT EXTRACTION     right 01/2010, left may 2011  . COLONOSCOPY WITH PROPOFOL N/A 03/31/2014   sessile serrated adenomas, sigmoid diverticulosis, rpt 3 yrs Cleotis Nipper, MD)  . DILATION AND CURETTAGE OF UTERUS  '77   for molar pregnancy excision  . ESOPHAGOGASTRODUODENOSCOPY N/A 03/31/2014   mult gastric polyps, mild mucosal hemorrhages; ESOPHAGOGASTRODUODENOSCOPY (EGD);  Surgeon: Cleotis Nipper, MD  . ESOPHAGOGASTRODUODENOSCOPY ENDOSCOPY  april 2014    OB History    No data available       Home Medications    Prior to Admission medications   Medication Sig  Start Date End Date Taking? Authorizing Provider  acetaminophen (TYLENOL) 500 MG tablet Take 500 mg by mouth as needed.    Historical Provider, MD  Calcium Carb-Cholecalciferol 600-800 MG-UNIT TABS Take 1 tablet by mouth daily.    Historical Provider, MD  clidinium-chlordiazePOXIDE (LIBRAX) 5-2.5 MG capsule Take 1 capsule by mouth 2 (two) times daily. 01/02/17   Ria Bush, MD  cyanocobalamin (,VITAMIN B-12,) 1000 MCG/ML injection inject 1 mL intramuscularly every 30 DAYS 01/25/16   Ria Bush, MD  famotidine (PEPCID) 40 MG tablet TAKE 2 TABLETS BY MOUTH AT  BEDTIME 12/23/16   Ria Bush, MD  loratadine (CLARITIN) 10 MG tablet Take 10 mg by mouth daily as needed for allergies.    Historical Provider, MD  ondansetron (ZOFRAN ODT) 4 MG disintegrating tablet Take 1 tablet (4 mg total) by mouth every 8 (eight) hours as needed for nausea or vomiting. 02/06/15   Ria Bush, MD  Polyethylene Glycol 3350 (MIRALAX PO) Take 17 g by mouth daily as needed (constipation (in 8oz fluid)).     Historical Provider, MD  PRESCRIPTION MEDICATION Apply topically 2 (two) times daily. Estradiol Progesterone cream 0.05-30 mg/ml    Historical Provider, MD  Probiotic Product (PROBIOTIC PO) Take 1 capsule by mouth daily.    Historical Provider, MD  promethazine (PHENERGAN) 25 MG tablet Take 1 tablet (25 mg total) by mouth every 8 (eight) hours as needed for nausea or vomiting. 11/03/14   Ria Bush, MD  RABEprazole (ACIPHEX) 20 MG tablet Take 20 mg by mouth daily.    Historical Provider, MD  ranitidine (ZANTAC) 150 MG tablet Take 300 mg by mouth 2 (two) times daily.    Historical Provider, MD  simvastatin (ZOCOR) 40 MG tablet TAKE 1 TABLET BY MOUTH  EVERY EVENING 12/23/16   Ria Bush, MD  sucralfate (CARAFATE) 1 GM/10ML suspension Take 10 mLs (1 g total) by mouth 4 (four) times daily as needed. 01/30/16   Ria Bush, MD  triamterene-hydrochlorothiazide (DYAZIDE) 37.5-25 MG capsule TAKE 1  CAPSULE BY MOUTH  EVERY MORNING 12/23/16   Ria Bush, MD  venlafaxine XR (EFFEXOR-XR) 75 MG 24 hr capsule take 2 capsules by mouth once daily WITH BREAKFAST 03/25/16   Ria Bush, MD    Family History Family History  Problem Relation Age of Onset  . Cancer Father   . Heart disease Mother   . Hyperlipidemia Mother   . Hypertension Mother   . Heart attack Mother   . Cancer Maternal Aunt     breast  . Heart disease Maternal Grandmother   . Heart disease Maternal Grandfather   . Heart disease Paternal Grandmother   . Heart disease Paternal Grandfather   . Hyperlipidemia Brother   . Hypertension Brother   . Diabetes Neg Hx     Social History Social History  Substance Use Topics  . Smoking status:  Never Smoker  . Smokeless tobacco: Never Used  . Alcohol use Yes     Comment: twice a year     Allergies   Augmentin [amoxicillin-pot clavulanate] and Erythromycin   Review of Systems Review of Systems  Neurological: Positive for headaches.  All other systems reviewed and are negative.    Physical Exam Updated Vital Signs BP (!) 155/101 (BP Location: Left Arm)   Pulse 85   Temp 98.1 F (36.7 C) (Oral)   Resp 18   Ht 5\' 4"  (1.626 m)   Wt 66.7 kg   SpO2 98%   BMI 25.23 kg/m   Physical Exam  Constitutional: She is oriented to person, place, and time. She appears well-developed and well-nourished.  HENT:  Head: Normocephalic and atraumatic.  Mouth/Throat: Oropharynx is clear and moist.  Areas of bruising surrounding both eyes and across bridge of nose, no obvious facial deformities; some mild tenderness of the right inferior orbital rim; mid-face stable; dentition intact  Eyes: Conjunctivae and EOM are normal. Pupils are equal, round, and reactive to light.  EOMs fully intact, no nystagmus  Neck: Normal range of motion.  Cardiovascular: Normal rate, regular rhythm and normal heart sounds.   Pulmonary/Chest: Effort normal and breath sounds normal. No  respiratory distress. She has no wheezes.  Abdominal: Soft. Bowel sounds are normal. There is no tenderness. There is no rebound.  Musculoskeletal: Normal range of motion.       Cervical back: Normal.  CS non-tender, no deformities, full ROM maintained  Neurological: She is alert and oriented to person, place, and time.  AAOx3, answering questions and following commands appropriately; equal strength UE and LE bilaterally; CN grossly intact; moves all extremities appropriately without ataxia; no focal neuro deficits or facial asymmetry appreciated  Skin: Skin is warm and dry.  Psychiatric: She has a normal mood and affect.  Nursing note and vitals reviewed.    ED Treatments / Results  Labs (all labs ordered are listed, but only abnormal results are displayed) Labs Reviewed - No data to display  EKG  EKG Interpretation None       Radiology Ct Head Wo Contrast  Result Date: 01/15/2017 CLINICAL DATA:  Fall 5 days ago.  Headache.  Facial bruising. EXAM: CT HEAD WITHOUT CONTRAST CT MAXILLOFACIAL WITHOUT CONTRAST TECHNIQUE: Multidetector CT imaging of the head and maxillofacial structures were performed using the standard protocol without intravenous contrast. Multiplanar CT image reconstructions of the maxillofacial structures were also generated. COMPARISON:  MRI head 06/13/2011. FINDINGS: CT HEAD FINDINGS Brain: Ventricle size normal. Cerebral volume normal. Mild hypodensity in the periventricular white matter bilaterally. No acute infarct. Negative for hemorrhage mass or fluid collection Vascular: Negative for hyperdense vessel. Skull: Negative for fracture. Other: None CT MAXILLOFACIAL FINDINGS Osseous: Negative for facial fracture. No fracture of the orbit or mandible. No nasal bone fracture. Orbits: Normal orbital contents.  Bilateral cataract extraction. Sinuses: Paranasal sinuses clear.  Hypoplastic frontal sinuses. Soft tissues: Facial soft tissues normal. IMPRESSION: No acute  intracranial abnormality Negative for facial fracture. Electronically Signed   By: Franchot Gallo M.D.   On: 01/15/2017 20:32   Ct Maxillofacial Wo Contrast  Result Date: 01/15/2017 CLINICAL DATA:  Fall 5 days ago.  Headache.  Facial bruising. EXAM: CT HEAD WITHOUT CONTRAST CT MAXILLOFACIAL WITHOUT CONTRAST TECHNIQUE: Multidetector CT imaging of the head and maxillofacial structures were performed using the standard protocol without intravenous contrast. Multiplanar CT image reconstructions of the maxillofacial structures were also generated. COMPARISON:  MRI head 06/13/2011.  FINDINGS: CT HEAD FINDINGS Brain: Ventricle size normal. Cerebral volume normal. Mild hypodensity in the periventricular white matter bilaterally. No acute infarct. Negative for hemorrhage mass or fluid collection Vascular: Negative for hyperdense vessel. Skull: Negative for fracture. Other: None CT MAXILLOFACIAL FINDINGS Osseous: Negative for facial fracture. No fracture of the orbit or mandible. No nasal bone fracture. Orbits: Normal orbital contents.  Bilateral cataract extraction. Sinuses: Paranasal sinuses clear.  Hypoplastic frontal sinuses. Soft tissues: Facial soft tissues normal. IMPRESSION: No acute intracranial abnormality Negative for facial fracture. Electronically Signed   By: Franchot Gallo M.D.   On: 01/15/2017 20:32    Procedures Procedures (including critical care time)  Medications Ordered in ED Medications  acetaminophen (TYLENOL) tablet 650 mg (650 mg Oral Given 01/15/17 1959)  ketorolac (TORADOL) injection 60 mg (60 mg Intramuscular Given 01/15/17 2134)     Initial Impression / Assessment and Plan / ED Course  I have reviewed the triage vital signs and the nursing notes.  Pertinent labs & imaging results that were available during my care of the patient were reviewed by me and considered in my medical decision making (see chart for details).  66 year old female here after a fall with head injury 5 days  ago. She tripped while getting up in the middle of the night to go to the bathroom. She struck her head against the wall but did not lose consciousness. She reports she had ongoing headaches since this time and has developed some bruising around her eyes. Patient is awake, alert, appropriately oriented here. She is not having any focal neurologic deficits. She does have some bruising around both eyes and across the bridge of her nose. She has no gross facial deformities, does have some mild tenderness of the right inferior orbital rim. EOMs are intact, no signs of entrapment. She is not on anticoagulation.   CT head and max/face were obtained, no acute findings. Cervical spine is non-tender and I do not suspect acute fracture/subluxation. Patient likely with concussion. Headache was treated here, patient has had some improvement. Remains neurologically intact.  I discussed with patient and her husband at the bedside that symptoms may persist for up to 2 weeks. She should try to control headache with Tylenol or Motrin as needed at home. She was prescribed Zofran for nausea. Advised her to get adequate rest, try to avoid reading, watching TV, etc. or other activities that will strain her eyes. I recommended that she follow-up closely with her primary care doctor. She was given strict return precautions for any new or worsening symptoms including severe headache, uncontrolled vomiting, confusion, changes in speech, difficult to walking, numbness, weakness, etc. Patient and husband both acknowledge understanding and agreed with plan of care.  Final Clinical Impressions(s) / ED Diagnoses   Final diagnoses:  Injury of head, initial encounter  Concussion without loss of consciousness, initial encounter    New Prescriptions New Prescriptions   ONDANSETRON (ZOFRAN ODT) 4 MG DISINTEGRATING TABLET    Take 1 tablet (4 mg total) by mouth every 8 (eight) hours as needed for nausea.     Larene Pickett,  PA-C 01/15/17 2155    Varney Biles, MD 01/16/17 [phone]

## 2017-01-15 NOTE — ED Notes (Signed)
Returned from CT.

## 2017-01-15 NOTE — Telephone Encounter (Signed)
Pt has appt 08/18/17 at 10:30 with Dr.G.

## 2017-01-15 NOTE — ED Notes (Signed)
Spoke with Dr. Lita Mains about patient's condition. No new orders at this time. Patient upset that she has to go back to lobby. Process explained to patient and patient verbalized understanding.

## 2017-01-15 NOTE — ED Notes (Signed)
Patient transported to CT 

## 2017-01-15 NOTE — Discharge Instructions (Signed)
As we discussed, your head and face CT were normal, there was no evidence of brain bleed or other fracture. He likely have suffered a concussion. You may continue to have intermittent headache, nausea, some mild memory issues, or other symptoms over the next 2-3 weeks, but should gradually start to improve. You can continue Tylenol or Motrin as needed for headache.  Can take the zofran I have prescribed for nausea when needed. Make sure you are getting enough rest at night. Try to limit reading, watching TV, playing on electronics etc. as to avoid eye strain or worsening headache. Follow-up with your primary care doctor. Return to the ED for new or worsening symptoms-- severe headache, uncontrolled vomiting, significant confusion, vision changes, trouble walking, numbness, weakness, etc.

## 2017-01-15 NOTE — Telephone Encounter (Signed)
Patient Name: Shelby Vazquez  DOB: [birth year]    Initial Comment caller states mother has headache after hitting her head last week   Nurse Assessment  Nurse: Jimmey Ralph, RN, Epifanio Lesches Date/Time (Eastern Time): 01/15/2017 3:05:34 PM  Confirm and document reason for call. If symptomatic, describe symptoms. ---Caller states mother has headache after hitting her head last week. She is cathing q 4hrs and more frequent UTIs and currently on antibiotics that makes her nausea. She stumbled out of bed last week and hit her head on drywall. She has had a mild headache since this. Today's headache is much worse.  Does the patient have any new or worsening symptoms? ---Yes  Will a triage be completed? ---Yes  Related visit to physician within the last 2 weeks? ---Yes  Does the PT have any chronic conditions? (i.e. diabetes, asthma, etc.) ---Yes  List chronic conditions. ---Chronic UTIs HTN Cholesterol meds.  Is this a behavioral health or substance abuse call? ---No     Guidelines    Guideline Title Affirmed Question Affirmed Notes  Headache [1] MODERATE headache (e.g., interferes with normal activities) AND [2] present > 24 hours AND [3] unexplained (Exceptions: analgesics not tried, typical migraine, or headache part of viral illness)    Final Disposition User   See Physician within 24 Hours Hammonds, RN, Lissa    Referrals  REFERRED TO PCP OFFICE  REFERRED TO PCP OFFICE   Disagree/Comply: Comply

## 2017-01-15 NOTE — ED Triage Notes (Signed)
Patient reports slipping and falling Friday morning. Patient hit the top of her head on the wall after sliding across the floor. Bruising noted to bilateral cheeks and bridge of nose. Denies blood thinner use. Patient complaining of headache. Denies losing consciousness at time of injury. Patient conscious, alert, oriented, ambulatory.

## 2017-01-16 ENCOUNTER — Ambulatory Visit: Payer: Medicare Other | Admitting: Family Medicine

## 2017-02-10 ENCOUNTER — Other Ambulatory Visit: Payer: Self-pay | Admitting: Family Medicine

## 2017-02-10 DIAGNOSIS — I10 Essential (primary) hypertension: Secondary | ICD-10-CM

## 2017-02-10 DIAGNOSIS — E785 Hyperlipidemia, unspecified: Secondary | ICD-10-CM

## 2017-02-10 DIAGNOSIS — E611 Iron deficiency: Secondary | ICD-10-CM

## 2017-02-10 DIAGNOSIS — E538 Deficiency of other specified B group vitamins: Secondary | ICD-10-CM

## 2017-02-13 ENCOUNTER — Other Ambulatory Visit (INDEPENDENT_AMBULATORY_CARE_PROVIDER_SITE_OTHER): Payer: Medicare Other

## 2017-02-13 DIAGNOSIS — E538 Deficiency of other specified B group vitamins: Secondary | ICD-10-CM | POA: Diagnosis not present

## 2017-02-13 DIAGNOSIS — E785 Hyperlipidemia, unspecified: Secondary | ICD-10-CM

## 2017-02-13 DIAGNOSIS — E611 Iron deficiency: Secondary | ICD-10-CM | POA: Diagnosis not present

## 2017-02-13 DIAGNOSIS — I10 Essential (primary) hypertension: Secondary | ICD-10-CM | POA: Diagnosis not present

## 2017-02-13 LAB — CBC WITH DIFFERENTIAL/PLATELET
BASOS ABS: 0 10*3/uL (ref 0.0–0.1)
BASOS PCT: 0.6 % (ref 0.0–3.0)
EOS PCT: 2.8 % (ref 0.0–5.0)
Eosinophils Absolute: 0.2 10*3/uL (ref 0.0–0.7)
HCT: 40.6 % (ref 36.0–46.0)
Hemoglobin: 13.7 g/dL (ref 12.0–15.0)
LYMPHS ABS: 2.1 10*3/uL (ref 0.7–4.0)
LYMPHS PCT: 37.8 % (ref 12.0–46.0)
MCHC: 33.8 g/dL (ref 30.0–36.0)
MCV: 89.5 fl (ref 78.0–100.0)
MONO ABS: 0.4 10*3/uL (ref 0.1–1.0)
MONOS PCT: 8 % (ref 3.0–12.0)
NEUTROS ABS: 2.8 10*3/uL (ref 1.4–7.7)
NEUTROS PCT: 50.8 % (ref 43.0–77.0)
Platelets: 314 10*3/uL (ref 150.0–400.0)
RBC: 4.53 Mil/uL (ref 3.87–5.11)
RDW: 13.3 % (ref 11.5–15.5)
WBC: 5.6 10*3/uL (ref 4.0–10.5)

## 2017-02-13 LAB — MICROALBUMIN / CREATININE URINE RATIO
CREATININE, U: 138.7 mg/dL
MICROALB UR: 0.9 mg/dL (ref 0.0–1.9)
MICROALB/CREAT RATIO: 0.6 mg/g (ref 0.0–30.0)

## 2017-02-13 LAB — VITAMIN B12: Vitamin B-12: 395 pg/mL (ref 211–911)

## 2017-02-13 LAB — COMPREHENSIVE METABOLIC PANEL
ALT: 17 U/L (ref 0–35)
AST: 23 U/L (ref 0–37)
Albumin: 4.4 g/dL (ref 3.5–5.2)
Alkaline Phosphatase: 40 U/L (ref 39–117)
BILIRUBIN TOTAL: 0.5 mg/dL (ref 0.2–1.2)
BUN: 12 mg/dL (ref 6–23)
CALCIUM: 9.6 mg/dL (ref 8.4–10.5)
CHLORIDE: 99 meq/L (ref 96–112)
CO2: 30 meq/L (ref 19–32)
Creatinine, Ser: 0.93 mg/dL (ref 0.40–1.20)
GFR: 64.09 mL/min (ref >60.00)
GLUCOSE: 127 mg/dL — AB (ref 70–99)
POTASSIUM: 3.9 meq/L (ref 3.5–5.1)
Sodium: 136 mEq/L (ref 135–145)
Total Protein: 6.8 g/dL (ref 6.0–8.3)

## 2017-02-13 LAB — TSH: TSH: 4.11 u[IU]/mL (ref 0.35–4.50)

## 2017-02-13 LAB — LIPID PANEL
CHOL/HDL RATIO: 3
Cholesterol: 166 mg/dL (ref 0–200)
HDL: 52.2 mg/dL (ref >39.00)
LDL CALC: 96 mg/dL (ref 0–99)
NONHDL: 113.96
TRIGLYCERIDES: 92 mg/dL (ref 0.0–149.0)
VLDL: 18.4 mg/dL (ref 0.0–40.0)

## 2017-02-13 LAB — IRON: IRON: 92 ug/dL (ref 42–145)

## 2017-02-17 ENCOUNTER — Encounter: Payer: Self-pay | Admitting: Family Medicine

## 2017-02-17 ENCOUNTER — Ambulatory Visit (INDEPENDENT_AMBULATORY_CARE_PROVIDER_SITE_OTHER): Payer: Medicare Other | Admitting: Family Medicine

## 2017-02-17 VITALS — BP 140/98 | HR 88 | Temp 97.8°F | Ht 64.0 in | Wt 155.5 lb

## 2017-02-17 DIAGNOSIS — E611 Iron deficiency: Secondary | ICD-10-CM

## 2017-02-17 DIAGNOSIS — F331 Major depressive disorder, recurrent, moderate: Secondary | ICD-10-CM

## 2017-02-17 DIAGNOSIS — K580 Irritable bowel syndrome with diarrhea: Secondary | ICD-10-CM

## 2017-02-17 DIAGNOSIS — E785 Hyperlipidemia, unspecified: Secondary | ICD-10-CM

## 2017-02-17 DIAGNOSIS — I700 Atherosclerosis of aorta: Secondary | ICD-10-CM

## 2017-02-17 DIAGNOSIS — R339 Retention of urine, unspecified: Secondary | ICD-10-CM

## 2017-02-17 DIAGNOSIS — E538 Deficiency of other specified B group vitamins: Secondary | ICD-10-CM

## 2017-02-17 DIAGNOSIS — N390 Urinary tract infection, site not specified: Secondary | ICD-10-CM

## 2017-02-17 DIAGNOSIS — Z0000 Encounter for general adult medical examination without abnormal findings: Secondary | ICD-10-CM | POA: Diagnosis not present

## 2017-02-17 DIAGNOSIS — M8580 Other specified disorders of bone density and structure, unspecified site: Secondary | ICD-10-CM

## 2017-02-17 DIAGNOSIS — Z7189 Other specified counseling: Secondary | ICD-10-CM

## 2017-02-17 DIAGNOSIS — I10 Essential (primary) hypertension: Secondary | ICD-10-CM

## 2017-02-17 MED ORDER — CYANOCOBALAMIN 1000 MCG/ML IJ SOLN: INTRAMUSCULAR | 1 refills | Status: DC

## 2017-02-17 NOTE — Progress Notes (Signed)
Pre visit review using our clinic review tool, if applicable. No additional management support is needed unless otherwise documented below in the visit note. 

## 2017-02-17 NOTE — Assessment & Plan Note (Signed)
Chronic. bp elevated despite maxzide - pt attributes to poor sleep. See above.

## 2017-02-17 NOTE — Patient Instructions (Addendum)
Colonoscopy due later this year.  Return as needed or in 6 months for follow up visit.  Check with pharmacy about new shingles shot (shingrix).  Restart B12 shots every 2-3 months.  Return in 6 months for follow up visit.   Health Maintenance, Female Adopting a healthy lifestyle and getting preventive care can go a long way to promote health and wellness. Talk with your health care provider about what schedule of regular examinations is right for you. This is a good chance for you to check in with your provider about disease prevention and staying healthy. In between checkups, there are plenty of things you can do on your own. Experts have done a lot of research about which lifestyle changes and preventive measures are most likely to keep you healthy. Ask your health care provider for more information. Weight and diet Eat a healthy diet  Be sure to include plenty of vegetables, fruits, low-fat dairy products, and lean protein.  Do not eat a lot of foods high in solid fats, added sugars, or salt.  Get regular exercise. This is one of the most important things you can do for your health.  Most adults should exercise for at least 150 minutes each week. The exercise should increase your heart rate and make you sweat (moderate-intensity exercise).  Most adults should also do strengthening exercises at least twice a week. This is in addition to the moderate-intensity exercise. Maintain a healthy weight  Body mass index (BMI) is a measurement that can be used to identify possible weight problems. It estimates body fat based on height and weight. Your health care provider can help determine your BMI and help you achieve or maintain a healthy weight.  For females 53 years of age and older:  A BMI below 18.5 is considered underweight.  A BMI of 18.5 to 24.9 is normal.  A BMI of 25 to 29.9 is considered overweight.  A BMI of 30 and above is considered obese. Watch levels of cholesterol and  blood lipids  You should start having your blood tested for lipids and cholesterol at 66 years of age, then have this test every 5 years.  You may need to have your cholesterol levels checked more often if:  Your lipid or cholesterol levels are high.  You are older than 66 years of age.  You are at high risk for heart disease. Cancer screening Lung Cancer  Lung cancer screening is recommended for adults 58-39 years old who are at high risk for lung cancer because of a history of smoking.  A yearly low-dose CT scan of the lungs is recommended for people who:  Currently smoke.  Have quit within the past 15 years.  Have at least a 30-pack-year history of smoking. A pack year is smoking an average of one pack of cigarettes a day for 1 year.  Yearly screening should continue until it has been 15 years since you quit.  Yearly screening should stop if you develop a health problem that would prevent you from having lung cancer treatment. Breast Cancer  Practice breast self-awareness. This means understanding how your breasts normally appear and feel.  It also means doing regular breast self-exams. Let your health care provider know about any changes, no matter how small.  If you are in your 20s or 30s, you should have a clinical breast exam (CBE) by a health care provider every 1-3 years as part of a regular health exam.  If you are 40 or older,  have a CBE every year. Also consider having a breast X-ray (mammogram) every year.  If you have a family history of breast cancer, talk to your health care provider about genetic screening.  If you are at high risk for breast cancer, talk to your health care provider about having an MRI and a mammogram every year.  Breast cancer gene (BRCA) assessment is recommended for women who have family members with BRCA-related cancers. BRCA-related cancers include:  Breast.  Ovarian.  Tubal.  Peritoneal cancers.  Results of the assessment  will determine the need for genetic counseling and BRCA1 and BRCA2 testing. Cervical Cancer  Your health care provider may recommend that you be screened regularly for cancer of the pelvic organs (ovaries, uterus, and vagina). This screening involves a pelvic examination, including checking for microscopic changes to the surface of your cervix (Pap test). You may be encouraged to have this screening done every 3 years, beginning at age 64.  For women ages 41-65, health care providers may recommend pelvic exams and Pap testing every 3 years, or they may recommend the Pap and pelvic exam, combined with testing for human papilloma virus (HPV), every 5 years. Some types of HPV increase your risk of cervical cancer. Testing for HPV may also be done on women of any age with unclear Pap test results.  Other health care providers may not recommend any screening for nonpregnant women who are considered low risk for pelvic cancer and who do not have symptoms. Ask your health care provider if a screening pelvic exam is right for you.  If you have had past treatment for cervical cancer or a condition that could lead to cancer, you need Pap tests and screening for cancer for at least 20 years after your treatment. If Pap tests have been discontinued, your risk factors (such as having a new sexual partner) need to be reassessed to determine if screening should resume. Some women have medical problems that increase the chance of getting cervical cancer. In these cases, your health care provider may recommend more frequent screening and Pap tests. Colorectal Cancer  This type of cancer can be detected and often prevented.  Routine colorectal cancer screening usually begins at 66 years of age and continues through 66 years of age.  Your health care provider may recommend screening at an earlier age if you have risk factors for colon cancer.  Your health care provider may also recommend using home test kits to check  for hidden blood in the stool.  A small camera at the end of a tube can be used to examine your colon directly (sigmoidoscopy or colonoscopy). This is done to check for the earliest forms of colorectal cancer.  Routine screening usually begins at age 88.  Direct examination of the colon should be repeated every 5-10 years through 66 years of age. However, you may need to be screened more often if early forms of precancerous polyps or small growths are found. Skin Cancer  Check your skin from head to toe regularly.  Tell your health care provider about any new moles or changes in moles, especially if there is a change in a mole's shape or color.  Also tell your health care provider if you have a mole that is larger than the size of a pencil eraser.  Always use sunscreen. Apply sunscreen liberally and repeatedly throughout the day.  Protect yourself by wearing long sleeves, pants, a wide-brimmed hat, and sunglasses whenever you are outside. Heart disease, diabetes,  and high blood pressure  High blood pressure causes heart disease and increases the risk of stroke. High blood pressure is more likely to develop in:  People who have blood pressure in the high end of the normal range (130-139/85-89 mm Hg).  People who are overweight or obese.  People who are African American.  If you are 63-50 years of age, have your blood pressure checked every 3-5 years. If you are 80 years of age or older, have your blood pressure checked every year. You should have your blood pressure measured twice-once when you are at a hospital or clinic, and once when you are not at a hospital or clinic. Record the average of the two measurements. To check your blood pressure when you are not at a hospital or clinic, you can use:  An automated blood pressure machine at a pharmacy.  A home blood pressure monitor.  If you are between 66 years and 21 years old, ask your health care provider if you should take aspirin  to prevent strokes.  Have regular diabetes screenings. This involves taking a blood sample to check your fasting blood sugar level.  If you are at a normal weight and have a low risk for diabetes, have this test once every three years after 66 years of age.  If you are overweight and have a high risk for diabetes, consider being tested at a younger age or more often. Preventing infection Hepatitis B  If you have a higher risk for hepatitis B, you should be screened for this virus. You are considered at high risk for hepatitis B if:  You were born in a country where hepatitis B is common. Ask your health care provider which countries are considered high risk.  Your parents were born in a high-risk country, and you have not been immunized against hepatitis B (hepatitis B vaccine).  You have HIV or AIDS.  You use needles to inject street drugs.  You live with someone who has hepatitis B.  You have had sex with someone who has hepatitis B.  You get hemodialysis treatment.  You take certain medicines for conditions, including cancer, organ transplantation, and autoimmune conditions. Hepatitis C  Blood testing is recommended for:  Everyone born from [birth year] through 1965.  Anyone with known risk factors for hepatitis C. Sexually transmitted infections (STIs)  You should be screened for sexually transmitted infections (STIs) including gonorrhea and chlamydia if:  You are sexually active and are younger than 66 years of age.  You are older than 66 years of age and your health care provider tells you that you are at risk for this type of infection.  Your sexual activity has changed since you were last screened and you are at an increased risk for chlamydia or gonorrhea. Ask your health care provider if you are at risk.  If you do not have HIV, but are at risk, it may be recommended that you take a prescription medicine daily to prevent HIV infection. This is called pre-exposure  prophylaxis (PrEP). You are considered at risk if:  You are sexually active and do not regularly use condoms or know the HIV status of your partner(s).  You take drugs by injection.  You are sexually active with a partner who has HIV. Talk with your health care provider about whether you are at high risk of being infected with HIV. If you choose to begin PrEP, you should first be tested for HIV. You should then be tested every  3 months for as long as you are taking PrEP. Pregnancy  If you are premenopausal and you may become pregnant, ask your health care provider about preconception counseling.  If you may become pregnant, take 400 to 800 micrograms (mcg) of folic acid every day.  If you want to prevent pregnancy, talk to your health care provider about birth control (contraception). Osteoporosis and menopause  Osteoporosis is a disease in which the bones lose minerals and strength with aging. This can result in serious bone fractures. Your risk for osteoporosis can be identified using a bone density scan.  If you are 54 years of age or older, or if you are at risk for osteoporosis and fractures, ask your health care provider if you should be screened.  Ask your health care provider whether you should take a calcium or vitamin D supplement to lower your risk for osteoporosis.  Menopause may have certain physical symptoms and risks.  Hormone replacement therapy may reduce some of these symptoms and risks. Talk to your health care provider about whether hormone replacement therapy is right for you. Follow these instructions at home:  Schedule regular health, dental, and eye exams.  Stay current with your immunizations.  Do not use any tobacco products including cigarettes, chewing tobacco, or electronic cigarettes.  If you are pregnant, do not drink alcohol.  If you are breastfeeding, limit how much and how often you drink alcohol.  Limit alcohol intake to no more than 1 drink  per day for nonpregnant women. One drink equals 12 ounces of beer, 5 ounces of wine, or 1 ounces of hard liquor.  Do not use street drugs.  Do not share needles.  Ask your health care provider for help if you need support or information about quitting drugs.  Tell your health care provider if you often feel depressed.  Tell your health care provider if you have ever been abused or do not feel safe at home. This information is not intended to replace advice given to you by your health care provider. Make sure you discuss any questions you have with your health care provider. Document Released: 04/22/2011 Document Revised: 03/14/2016 Document Reviewed: 07/11/2015 Elsevier Interactive Patient Education  2017 [employer].

## 2017-02-17 NOTE — Assessment & Plan Note (Signed)
Followed by GYN

## 2017-02-17 NOTE — Assessment & Plan Note (Signed)
Preventative protocols reviewed and updated unless pt declined. Discussed healthy diet and lifestyle.  

## 2017-02-17 NOTE — Assessment & Plan Note (Signed)
Iron levels normal. No longer anemic, will resolve.

## 2017-02-17 NOTE — Assessment & Plan Note (Addendum)
Ongoing, currently situational. Feels effexor is helpful. PHQ9 = 10 today.

## 2017-02-17 NOTE — Assessment & Plan Note (Signed)
Stable period.  

## 2017-02-17 NOTE — Assessment & Plan Note (Signed)
Continue statin. 

## 2017-02-17 NOTE — Assessment & Plan Note (Signed)
Followed by urology with daily macrobid or daily hiprex (methenamine)

## 2017-02-17 NOTE — Assessment & Plan Note (Signed)
Followed by urology - now using intermittent self catheterizations. Planned inter bladder stimulant implant by Dr Cecile Hearing.

## 2017-02-17 NOTE — Assessment & Plan Note (Addendum)
Overall stable readings on simvastatin - continue.

## 2017-02-17 NOTE — Progress Notes (Addendum)
BP (!) 140/98   Pulse 88   Temp 97.8 F (36.6 C) (Oral)   Ht 5\' 4"  (1.626 m)   Wt 155 lb 8 oz (70.5 kg)   SpO2 96%   BMI 26.69 kg/m    CC: AMW/CPE Subjective:    Patient ID: Shelby Vazquez, female    DOB: 03-15-1951, 66 y.o.   MRN: [id number]  HPI: Shelby Vazquez is a 66 y.o. female presenting on 02/17/2017 for Annual Exam (Medicare)   Stressful period with recent concussion and recurrent UTIs. Not sleeping well, gaining weight.   Seen by urology Dr Amalia Hailey with recurrent UTIs with urinary retention treated with intermittent self catheterization (08/2016) now Q4 hours, had cystoscopy earlier this month. Ongoing nausea, lower back pain, UTIs. Planning interbladder implant. She was placed on hiprex for UTI prevention.   She did have fall 1 month ago and was evaluated at ER for this - note reviewed. She did have nasal bridge hematoma. CT head and max/face were unrevealing. Dx concussion. Continues slow recuperation.   Hearing - passed Vision - with eye doctor 1 fall with injury Depression - struggles with mood despite effexor - will update PHQ9 today. Thinks this is situational - from lack of sleep from urinary issues.   Preventative: COLONOSCOPY WITH PROPOFOL Date: 03/31/2014 sessile serrated adenomas, sigmoid diverticulosis, rpt 3 yrs Cleotis Nipper, MD) ESOPHAGOGASTRODUODENOSCOPY Date: 03/31/2014 mult gastric polyps, mild mucosal hemorrhages; Cleotis Nipper, MD Well woman with Dr. 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 sees yearly in August. DEXA 01/2017 osteopenia ([place]).  Mammogram 06/2016 WNL Flu shot yearly Pneumovax 01/2013, prevnar 01/2016 Td 2011 Zostavax 04/2013, shingrix discussed - she will check with pharmacy.  Advanced directives: does not have this set up. Packet provided last visit. HCPOA would be daughter Shelby Vazquez.  Seat belt use discussed Sunscreen use discussed, no changing moles on skin Non smoker Alcohol - rare  Married 1973. 2  daughters '74, '80; 5 grandchildren.  No h/o physical or sexual abuse. 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  Edu: [employer] Activity: really enjoys hiking, now training for 5k Diet: good water, fruits/vegetables daily  Relevant past medical, surgical, family and social history reviewed and updated as indicated. Interim medical history since our last visit reviewed. Allergies and medications reviewed and updated. Outpatient Medications Prior to Visit  Medication Sig Dispense Refill  . acetaminophen (TYLENOL) 500 MG tablet Take 1,000 mg by mouth every 6 (six) hours as needed for moderate pain.     . Calcium Carbonate-Vitamin D 600-200 MG-UNIT CAPS Take 1 capsule by mouth daily.    . clidinium-chlordiazePOXIDE (LIBRAX) 5-2.5 MG capsule Take 1 capsule by mouth 2 (two) times daily. 180 capsule 1  . famotidine (PEPCID) 40 MG tablet TAKE 2 TABLETS BY MOUTH AT  BEDTIME 180 tablet 0  . loratadine (CLARITIN) 10 MG tablet Take 10 mg by mouth daily as needed for allergies.    . methenamine (HIPREX) 1 g tablet Take 1 g by mouth 2 (two) times daily with a meal.   1  . Omega-3 Fatty Acids (FISH OIL) 1000 MG CAPS Take 1 capsule by mouth daily.    . ondansetron (ZOFRAN ODT) 4 MG disintegrating tablet Take 1 tablet (4 mg total) by mouth every 8 (eight) hours as needed for nausea. 10 tablet 0  . Polyethylene Glycol 3350 (MIRALAX PO) Take 17 g by mouth daily as needed (constipation (in 8oz fluid)).     Marland Kitchen  PRESCRIPTION MEDICATION Apply topically 2 (two) times daily. Estradiol Progesterone cream 0.05-30 mg/ml    . Probiotic Product (PROBIOTIC PO) Take 1 capsule by mouth daily.    . promethazine (PHENERGAN) 25 MG tablet Take 1 tablet (25 mg total) by mouth every 8 (eight) hours as needed for nausea or vomiting. 20 tablet 0  . RABEprazole (ACIPHEX) 20 MG tablet Take 20 mg by mouth daily.    . ranitidine (ZANTAC) 150 MG tablet Take 150 mg by  mouth 2 (two) times daily as needed for heartburn.     . simvastatin (ZOCOR) 40 MG tablet TAKE 1 TABLET BY MOUTH  EVERY EVENING 90 tablet 0  . sucralfate (CARAFATE) 1 GM/10ML suspension Take 10 mLs (1 g total) by mouth 4 (four) times daily as needed. (Patient taking differently: Take 1 g by mouth 4 (four) times daily as needed (flare up). ) 420 mL 3  . triamterene-hydrochlorothiazide (DYAZIDE) 37.5-25 MG capsule TAKE 1 CAPSULE BY MOUTH  EVERY MORNING 90 capsule 0  . venlafaxine XR (EFFEXOR-XR) 75 MG 24 hr capsule take 2 capsules by mouth once daily WITH BREAKFAST (Patient taking differently: take 2 capsules by mouth once daily qhs) 60 capsule 11  . cyanocobalamin (,VITAMIN B-12,) 1000 MCG/ML injection inject 1 mL intramuscularly every 30 DAYS 30 mL 3  . Melatonin 10 MG TABS Take 30 tablets by mouth at bedtime.     No facility-administered medications prior to visit.      Per HPI unless specifically indicated in ROS section below Review of Systems  Constitutional: Negative for activity change, appetite change, chills, fatigue, fever and unexpected weight change.  HENT: Negative for hearing loss.   Eyes: Negative for visual disturbance.  Respiratory: Negative for cough, chest tightness, shortness of breath and wheezing.   Cardiovascular: Negative for chest pain, palpitations and leg swelling.  Gastrointestinal: Positive for nausea. Negative for abdominal distention, abdominal pain, blood in stool, constipation, diarrhea and vomiting.  Genitourinary: Negative for difficulty urinating and hematuria.  Musculoskeletal: Negative for arthralgias, myalgias and neck pain.  Skin: Negative for rash.  Neurological: Positive for headaches. Negative for dizziness, seizures and syncope.  Hematological: Negative for adenopathy. Does not bruise/bleed easily.  Psychiatric/Behavioral: Positive for dysphoric mood. The patient is not nervous/anxious.        Objective:    BP (!) 140/98   Pulse 88   Temp  97.8 F (36.6 C) (Oral)   Ht 5\' 4"  (1.626 m)   Wt 155 lb 8 oz (70.5 kg)   SpO2 96%   BMI 26.69 kg/m   Wt Readings from Last 3 Encounters:  02/17/17 155 lb 8 oz (70.5 kg)  01/15/17 147 lb (66.7 kg)  06/05/16 146 lb 12 oz (66.6 kg)    Physical Exam  Constitutional: She is oriented to person, place, and time. She appears well-developed and well-nourished. No distress.  HENT:  Head: Normocephalic and atraumatic.  Right Ear: Hearing, tympanic membrane, external ear and ear canal normal.  Left Ear: Hearing, tympanic membrane, external ear and ear canal normal.  Nose: Nose normal.  Mouth/Throat: Uvula is midline, oropharynx is clear and moist and mucous membranes are normal. No oropharyngeal exudate, posterior oropharyngeal edema or posterior oropharyngeal erythema.  Eyes: Conjunctivae and EOM are normal. Pupils are equal, round, and reactive to light. No scleral icterus.  Neck: Normal range of motion. Neck supple. Carotid bruit is not present. No thyromegaly present.  Cardiovascular: Normal rate, regular rhythm, normal heart sounds and intact distal pulses.   No  murmur heard. Pulses:      Radial pulses are 2+ on the right side, and 2+ on the left side.  Pulmonary/Chest: Effort normal and breath sounds normal. No respiratory distress. She has no wheezes. She has no rales.  Abdominal: Soft. Bowel sounds are normal. She exhibits no distension and no mass. There is no tenderness. There is no rebound and no guarding.  Musculoskeletal: Normal range of motion. She exhibits no edema.  Lymphadenopathy:    She has no cervical adenopathy.  Neurological: She is alert and oriented to person, place, and time.  CN grossly intact, station and gait intact Recall 3/3 Calculation 4/5 serial 3s  Skin: Skin is warm and dry. No rash noted.  Psychiatric: She has a normal mood and affect. Her behavior is normal. Judgment and thought content normal.  Nursing note and vitals reviewed.  Results for orders  placed or performed in visit on 02/13/17  Lipid panel  Result Value Ref Range   Cholesterol 166 0 - 200 mg/dL   Triglycerides 92.0 0.0 - 149.0 mg/dL   HDL 52.20 >39.00 mg/dL   VLDL 18.4 0.0 - 40.0 mg/dL   LDL Cholesterol 96 0 - 99 mg/dL   Total CHOL/HDL Ratio 3    NonHDL 113.96   Microalbumin / creatinine urine ratio  Result Value Ref Range   Microalb, Ur 0.9 0.0 - 1.9 mg/dL   Creatinine,U 138.7 mg/dL   Microalb Creat Ratio 0.6 0.0 - 30.0 mg/g  Comprehensive metabolic panel  Result Value Ref Range   Sodium 136 135 - 145 mEq/L   Potassium 3.9 3.5 - 5.1 mEq/L   Chloride 99 96 - 112 mEq/L   CO2 30 19 - 32 mEq/L   Glucose, Bld 127 (H) 70 - 99 mg/dL   BUN 12 6 - 23 mg/dL   Creatinine, Ser 0.93 0.40 - 1.20 mg/dL   Total Bilirubin 0.5 0.2 - 1.2 mg/dL   Alkaline Phosphatase 40 39 - 117 U/L   AST 23 0 - 37 U/L   ALT 17 0 - 35 U/L   Total Protein 6.8 6.0 - 8.3 g/dL   Albumin 4.4 3.5 - 5.2 g/dL   Calcium 9.6 8.4 - 10.5 mg/dL   GFR 64.09 >60.00 mL/min  TSH  Result Value Ref Range   TSH 4.11 0.35 - 4.50 uIU/mL  CBC with Differential/Platelet  Result Value Ref Range   WBC 5.6 4.0 - 10.5 K/uL   RBC 4.53 3.87 - 5.11 Mil/uL   Hemoglobin 13.7 12.0 - 15.0 g/dL   HCT 40.6 36.0 - 46.0 %   MCV 89.5 78.0 - 100.0 fl   MCHC 33.8 30.0 - 36.0 g/dL   RDW 13.3 11.5 - 15.5 %   Platelets 314.0 150.0 - 400.0 K/uL   Neutrophils Relative % 50.8 43.0 - 77.0 %   Lymphocytes Relative 37.8 12.0 - 46.0 %   Monocytes Relative 8.0 3.0 - 12.0 %   Eosinophils Relative 2.8 0.0 - 5.0 %   Basophils Relative 0.6 0.0 - 3.0 %   Neutro Abs 2.8 1.4 - 7.7 K/uL   Lymphs Abs 2.1 0.7 - 4.0 K/uL   Monocytes Absolute 0.4 0.1 - 1.0 K/uL   Eosinophils Absolute 0.2 0.0 - 0.7 K/uL   Basophils Absolute 0.0 0.0 - 0.1 K/uL  Iron  Result Value Ref Range   Iron 92 42 - 145 ug/dL  Vitamin B12  Result Value Ref Range   Vitamin B-12 395 211 - 911 pg/mL  Assessment & Plan:   Problem List Items Addressed This Visit      Abdominal aortic atherosclerosis ([place])    Continue statin.       Advanced care planning/counseling discussion    Advanced directives: does not have this set up. Packet provided last visit. HCPOA would be daughter Shelby Vazquez.       Essential hypertension    Chronic. bp elevated despite maxzide - pt attributes to poor sleep. See above.       HLD (hyperlipidemia)    Overall stable readings on simvastatin - continue.       IBS    Stable period.       RESOLVED: Iron deficiency    Iron levels normal. No longer anemic, will resolve.       MDD (major depressive disorder), recurrent episode, moderate ([place])    Ongoing, currently situational. Feels effexor is helpful. PHQ9 = 10 today.      Medicare annual wellness visit, initial - Primary    I have personally reviewed the Medicare Annual Wellness questionnaire and have noted 1. The patient's medical and social history 2. Their use of alcohol, tobacco or illicit drugs 3. Their current medications and supplements 4. The patient's functional ability including ADL's, fall risks, home safety risks and hearing or visual impairment. Cognitive function has been assessed and addressed as indicated.  5. Diet and physical activity 6. Evidence for depression or mood disorders The patients weight, height, BMI have been recorded in the chart. I have made referrals, counseling and provided education to the patient based on review of the above and I have provided the pt with a written personalized care plan for preventive services. Provider list updated.. See scanned questionairre as needed for further documentation. Reviewed preventative protocols and updated unless pt declined.       Osteopenia    Followed by GYN.       Recurrent UTI    Followed by urology with daily macrobid or daily hiprex (methenamine)      Relevant Medications   nitrofurantoin, macrocrystal-monohydrate, (MACROBID) 100 MG capsule   Routine health maintenance     Preventative protocols reviewed and updated unless pt declined. Discussed healthy diet and lifestyle.       Urinary retention with incomplete bladder emptying    Followed by urology - now using intermittent self catheterizations. Planned inter bladder stimulant implant by Dr Cecile Hearing.       Vitamin B12 deficiency    Levels slowly trending down - advised restart B12 shots Q2-3 months.           Follow up plan: Return in about 6 months (around 08/19/2017) for follow up visit.  Ria Bush, MD

## 2017-02-17 NOTE — Assessment & Plan Note (Signed)

## 2017-02-17 NOTE — Assessment & Plan Note (Signed)
Levels slowly trending down - advised restart B12 shots Q2-3 months.

## 2017-02-17 NOTE — Assessment & Plan Note (Signed)
Advanced directives: does not have this set up. Packet provided last visit. HCPOA would be daughter Shelby Vazquez.  

## 2017-02-18 ENCOUNTER — Other Ambulatory Visit: Payer: Self-pay | Admitting: Family Medicine

## 2017-02-20 ENCOUNTER — Encounter: Payer: Self-pay | Admitting: Family Medicine

## 2017-02-20 DIAGNOSIS — R739 Hyperglycemia, unspecified: Secondary | ICD-10-CM

## 2017-02-20 NOTE — Telephone Encounter (Signed)
Can we call and schedule lab appt for A1c? Thanks.

## 2017-02-21 ENCOUNTER — Other Ambulatory Visit (INDEPENDENT_AMBULATORY_CARE_PROVIDER_SITE_OTHER): Payer: Medicare Other

## 2017-02-21 DIAGNOSIS — R739 Hyperglycemia, unspecified: Secondary | ICD-10-CM | POA: Diagnosis not present

## 2017-02-21 LAB — HEMOGLOBIN A1C: Hgb A1c MFr Bld: 6.5 % (ref 4.6–6.5)

## 2017-02-22 ENCOUNTER — Encounter: Payer: Self-pay | Admitting: Family Medicine

## 2017-02-22 DIAGNOSIS — E119 Type 2 diabetes mellitus without complications: Secondary | ICD-10-CM

## 2017-02-22 HISTORY — DX: Type 2 diabetes mellitus without complications: E11.9

## 2017-03-12 ENCOUNTER — Other Ambulatory Visit: Payer: Self-pay | Admitting: Family Medicine

## 2017-03-18 ENCOUNTER — Other Ambulatory Visit: Payer: Self-pay | Admitting: Family Medicine

## 2017-03-19 NOTE — Telephone Encounter (Signed)
Last office visit 02/17/2017.  Last refilled 01/15/2017 for #10 with no refills.  Ok to refill?

## 2017-03-24 ENCOUNTER — Other Ambulatory Visit: Payer: Self-pay | Admitting: Family Medicine

## 2017-05-30 ENCOUNTER — Other Ambulatory Visit: Payer: Self-pay | Admitting: Family Medicine

## 2017-06-21 LAB — HM MAMMOGRAPHY: HM Mammogram: NORMAL (ref 0–4)

## 2017-07-16 ENCOUNTER — Inpatient Hospital Stay (HOSPITAL_COMMUNITY)
Admission: EM | Admit: 2017-07-16 | Discharge: 2017-07-20 | DRG: 699 | Disposition: A | Discharge status: Home / Self Care | Payer: Medicare Other | Attending: Internal Medicine | Admitting: Internal Medicine

## 2017-07-16 ENCOUNTER — Encounter (HOSPITAL_COMMUNITY): Payer: Self-pay | Admitting: Family Medicine

## 2017-07-16 DIAGNOSIS — N10 Acute tubulo-interstitial nephritis: Secondary | ICD-10-CM | POA: Diagnosis present

## 2017-07-16 DIAGNOSIS — R51 Headache: Secondary | ICD-10-CM | POA: Diagnosis present

## 2017-07-16 DIAGNOSIS — F411 Generalized anxiety disorder: Secondary | ICD-10-CM | POA: Diagnosis present

## 2017-07-16 DIAGNOSIS — E785 Hyperlipidemia, unspecified: Secondary | ICD-10-CM | POA: Diagnosis present

## 2017-07-16 DIAGNOSIS — I700 Atherosclerosis of aorta: Secondary | ICD-10-CM | POA: Diagnosis present

## 2017-07-16 DIAGNOSIS — B9620 Unspecified Escherichia coli [E. coli] as the cause of diseases classified elsewhere: Secondary | ICD-10-CM

## 2017-07-16 DIAGNOSIS — E538 Deficiency of other specified B group vitamins: Secondary | ICD-10-CM | POA: Diagnosis present

## 2017-07-16 DIAGNOSIS — N390 Urinary tract infection, site not specified: Secondary | ICD-10-CM | POA: Diagnosis present

## 2017-07-16 DIAGNOSIS — K589 Irritable bowel syndrome without diarrhea: Secondary | ICD-10-CM | POA: Diagnosis present

## 2017-07-16 DIAGNOSIS — N12 Tubulo-interstitial nephritis, not specified as acute or chronic: Secondary | ICD-10-CM

## 2017-07-16 DIAGNOSIS — R339 Retention of urine, unspecified: Secondary | ICD-10-CM | POA: Diagnosis present

## 2017-07-16 DIAGNOSIS — M85859 Other specified disorders of bone density and structure, unspecified thigh: Secondary | ICD-10-CM | POA: Diagnosis present

## 2017-07-16 DIAGNOSIS — Z881 Allergy status to other antibiotic agents status: Secondary | ICD-10-CM | POA: Diagnosis not present

## 2017-07-16 DIAGNOSIS — E119 Type 2 diabetes mellitus without complications: Secondary | ICD-10-CM | POA: Diagnosis present

## 2017-07-16 DIAGNOSIS — Z79899 Other long term (current) drug therapy: Secondary | ICD-10-CM | POA: Diagnosis not present

## 2017-07-16 DIAGNOSIS — R1030 Lower abdominal pain, unspecified: Secondary | ICD-10-CM | POA: Diagnosis not present

## 2017-07-16 DIAGNOSIS — Z8589 Personal history of malignant neoplasm of other organs and systems: Secondary | ICD-10-CM

## 2017-07-16 DIAGNOSIS — M8588 Other specified disorders of bone density and structure, other site: Secondary | ICD-10-CM | POA: Diagnosis present

## 2017-07-16 DIAGNOSIS — Z9221 Personal history of antineoplastic chemotherapy: Secondary | ICD-10-CM

## 2017-07-16 DIAGNOSIS — I10 Essential (primary) hypertension: Secondary | ICD-10-CM | POA: Diagnosis present

## 2017-07-16 DIAGNOSIS — T83518A Infection and inflammatory reaction due to other urinary catheter, initial encounter: Principal | ICD-10-CM | POA: Diagnosis present

## 2017-07-16 DIAGNOSIS — F331 Major depressive disorder, recurrent, moderate: Secondary | ICD-10-CM | POA: Diagnosis present

## 2017-07-16 DIAGNOSIS — E1169 Type 2 diabetes mellitus with other specified complication: Secondary | ICD-10-CM | POA: Diagnosis present

## 2017-07-16 DIAGNOSIS — N814 Uterovaginal prolapse, unspecified: Secondary | ICD-10-CM | POA: Diagnosis present

## 2017-07-16 DIAGNOSIS — E860 Dehydration: Secondary | ICD-10-CM | POA: Diagnosis present

## 2017-07-16 DIAGNOSIS — K219 Gastro-esophageal reflux disease without esophagitis: Secondary | ICD-10-CM | POA: Diagnosis present

## 2017-07-16 DIAGNOSIS — E784 Other hyperlipidemia: Secondary | ICD-10-CM | POA: Diagnosis not present

## 2017-07-16 HISTORY — DX: Unspecified Escherichia coli (E. coli) as the cause of diseases classified elsewhere: B96.20

## 2017-07-16 HISTORY — DX: Concussion with loss of consciousness status unknown, initial encounter: S06.0XAA

## 2017-07-16 HISTORY — DX: Tubulo-interstitial nephritis, not specified as acute or chronic: N12

## 2017-07-16 HISTORY — DX: Concussion with loss of consciousness of unspecified duration, initial encounter: S06.0X9A

## 2017-07-16 LAB — COMPREHENSIVE METABOLIC PANEL
ALK PHOS: 47 U/L (ref 38–126)
ALT: 24 U/L (ref 14–54)
ANION GAP: 11 (ref 5–15)
AST: 32 U/L (ref 15–41)
Albumin: 3.8 g/dL (ref 3.5–5.0)
BILIRUBIN TOTAL: 1.1 mg/dL (ref 0.3–1.2)
BUN: 17 mg/dL (ref 6–20)
CALCIUM: 9 mg/dL (ref 8.9–10.3)
CO2: 25 mmol/L (ref 22–32)
CREATININE: 0.9 mg/dL (ref 0.44–1.00)
Chloride: 97 mmol/L — ABNORMAL LOW (ref 101–111)
Glucose, Bld: 137 mg/dL — ABNORMAL HIGH (ref 65–99)
Potassium: 3.1 mmol/L — ABNORMAL LOW (ref 3.5–5.1)
Sodium: 133 mmol/L — ABNORMAL LOW (ref 135–145)
TOTAL PROTEIN: 6.8 g/dL (ref 6.5–8.1)

## 2017-07-16 LAB — BLOOD CULTURE ID PANEL (REFLEXED)
Acinetobacter baumannii: NOT DETECTED
CANDIDA KRUSEI: NOT DETECTED
CANDIDA PARAPSILOSIS: NOT DETECTED
CANDIDA TROPICALIS: NOT DETECTED
CARBAPENEM RESISTANCE: NOT DETECTED
Candida albicans: NOT DETECTED
Candida glabrata: NOT DETECTED
Enterobacter cloacae complex: NOT DETECTED
Enterobacteriaceae species: DETECTED — AB
Enterococcus species: NOT DETECTED
Escherichia coli: DETECTED — AB
HAEMOPHILUS INFLUENZAE: NOT DETECTED
KLEBSIELLA OXYTOCA: NOT DETECTED
KLEBSIELLA PNEUMONIAE: NOT DETECTED
Listeria monocytogenes: NOT DETECTED
Neisseria meningitidis: NOT DETECTED
Proteus species: NOT DETECTED
Pseudomonas aeruginosa: NOT DETECTED
SERRATIA MARCESCENS: NOT DETECTED
STAPHYLOCOCCUS AUREUS BCID: NOT DETECTED
STAPHYLOCOCCUS SPECIES: NOT DETECTED
Streptococcus agalactiae: NOT DETECTED
Streptococcus pneumoniae: NOT DETECTED
Streptococcus pyogenes: NOT DETECTED
Streptococcus species: NOT DETECTED

## 2017-07-16 LAB — CBC
HEMATOCRIT: 34.8 % — AB (ref 36.0–46.0)
HEMOGLOBIN: 11.8 g/dL — AB (ref 12.0–15.0)
MCH: 29.6 pg (ref 26.0–34.0)
MCHC: 33.9 g/dL (ref 30.0–36.0)
MCV: 87.2 fL (ref 78.0–100.0)
Platelets: 255 10*3/uL (ref 150–400)
RBC: 3.99 MIL/uL (ref 3.87–5.11)
RDW: 13.8 % (ref 11.5–15.5)
WBC: 11.3 10*3/uL — ABNORMAL HIGH (ref 4.0–10.5)

## 2017-07-16 LAB — URINALYSIS, ROUTINE W REFLEX MICROSCOPIC
Bilirubin Urine: NEGATIVE
GLUCOSE, UA: NEGATIVE mg/dL
Ketones, ur: NEGATIVE mg/dL
NITRITE: NEGATIVE
Protein, ur: 100 mg/dL — AB
Specific Gravity, Urine: 1.008 (ref 1.005–1.030)
pH: 5 (ref 5.0–8.0)

## 2017-07-16 LAB — GLUCOSE, CAPILLARY
GLUCOSE-CAPILLARY: 116 mg/dL — AB (ref 65–99)
Glucose-Capillary: 132 mg/dL — ABNORMAL HIGH (ref 65–99)

## 2017-07-16 LAB — I-STAT CG4 LACTIC ACID, ED: Lactic Acid, Venous: 1.01 mmol/L (ref 0.5–1.9)

## 2017-07-16 LAB — CBG MONITORING, ED: Glucose-Capillary: 136 mg/dL — ABNORMAL HIGH (ref 65–99)

## 2017-07-16 LAB — MAGNESIUM: MAGNESIUM: 1.9 mg/dL (ref 1.7–2.4)

## 2017-07-16 LAB — HEMOGLOBIN A1C
Hgb A1c MFr Bld: 6.1 % — ABNORMAL HIGH (ref 4.8–5.6)
MEAN PLASMA GLUCOSE: 128.37 mg/dL

## 2017-07-16 MED ORDER — LEVALBUTEROL HCL 0.63 MG/3ML IN NEBU: 0.6300 mg | INHALATION_SOLUTION | Freq: Four times a day (QID) | RESPIRATORY_TRACT | Status: DC | PRN

## 2017-07-16 MED ORDER — FENTANYL CITRATE (PF) 100 MCG/2ML IJ SOLN
12.5000 ug | Freq: Once | INTRAMUSCULAR | Status: AC
  Administered 2017-07-16: 12.5 ug via INTRAVENOUS
  Filled 2017-07-16: qty 2

## 2017-07-16 MED ORDER — POTASSIUM CHLORIDE IN NACL 40-0.9 MEQ/L-% IV SOLN
INTRAVENOUS | Status: DC
  Administered 2017-07-16 – 2017-07-20 (×9): 100 mL/h via INTRAVENOUS
  Filled 2017-07-16 (×10): qty 1000

## 2017-07-16 MED ORDER — POTASSIUM CHLORIDE CRYS ER 20 MEQ PO TBCR
40.0000 meq | EXTENDED_RELEASE_TABLET | Freq: Once | ORAL | Status: AC
  Administered 2017-07-16: 40 meq via ORAL
  Filled 2017-07-16: qty 2

## 2017-07-16 MED ORDER — METOCLOPRAMIDE HCL 5 MG/ML IJ SOLN
10.0000 mg | Freq: Once | INTRAMUSCULAR | Status: AC
  Administered 2017-07-16: 10 mg via INTRAVENOUS
  Filled 2017-07-16: qty 2

## 2017-07-16 MED ORDER — ONDANSETRON HCL 4 MG/2ML IJ SOLN
4.0000 mg | Freq: Once | INTRAMUSCULAR | Status: AC
  Administered 2017-07-16: 4 mg via INTRAVENOUS
  Filled 2017-07-16: qty 2

## 2017-07-16 MED ORDER — PANTOPRAZOLE SODIUM 40 MG PO TBEC
40.0000 mg | DELAYED_RELEASE_TABLET | Freq: Every day | ORAL | Status: DC
  Administered 2017-07-16 – 2017-07-20 (×5): 40 mg via ORAL
  Filled 2017-07-16 (×5): qty 1

## 2017-07-16 MED ORDER — ONDANSETRON HCL 4 MG PO TABS: 4.0000 mg | ORAL_TABLET | Freq: Four times a day (QID) | ORAL | Status: DC | PRN

## 2017-07-16 MED ORDER — VENLAFAXINE HCL ER 150 MG PO CP24: 150.0000 mg | ORAL_CAPSULE | Freq: Every day | ORAL | Status: DC

## 2017-07-16 MED ORDER — INSULIN ASPART 100 UNIT/ML ~~LOC~~ SOLN
0.0000 [IU] | Freq: Three times a day (TID) | SUBCUTANEOUS | Status: DC
  Administered 2017-07-17: 1 [IU] via SUBCUTANEOUS

## 2017-07-16 MED ORDER — CILIDINIUM-CHLORDIAZEPOXIDE 2.5-5 MG PO CAPS
1.0000 | ORAL_CAPSULE | Freq: Once | ORAL | Status: AC
  Administered 2017-07-16: 1 via ORAL
  Filled 2017-07-16: qty 1

## 2017-07-16 MED ORDER — LORATADINE 10 MG PO TABS
10.0000 mg | ORAL_TABLET | Freq: Every day | ORAL | Status: DC
  Administered 2017-07-17 – 2017-07-20 (×4): 10 mg via ORAL
  Filled 2017-07-16 (×5): qty 1

## 2017-07-16 MED ORDER — ENOXAPARIN SODIUM 40 MG/0.4ML ~~LOC~~ SOLN
40.0000 mg | SUBCUTANEOUS | Status: DC
  Administered 2017-07-16 – 2017-07-19 (×4): 40 mg via SUBCUTANEOUS
  Filled 2017-07-16 (×4): qty 0.4

## 2017-07-16 MED ORDER — ACETAMINOPHEN 650 MG RE SUPP: 650.0000 mg | Freq: Four times a day (QID) | RECTAL | Status: DC | PRN

## 2017-07-16 MED ORDER — SUCRALFATE 1 GM/10ML PO SUSP
1.0000 g | Freq: Three times a day (TID) | ORAL | Status: DC
  Administered 2017-07-19 – 2017-07-20 (×4): 1 g via ORAL
  Filled 2017-07-16 (×11): qty 10

## 2017-07-16 MED ORDER — DEXTROSE 5 % IV SOLN
1.0000 g | INTRAVENOUS | Status: DC
  Administered 2017-07-16: 1 g via INTRAVENOUS
  Filled 2017-07-16: qty 10

## 2017-07-16 MED ORDER — ACETAMINOPHEN 325 MG PO TABS
650.0000 mg | ORAL_TABLET | Freq: Four times a day (QID) | ORAL | Status: DC | PRN
Start: 2017-07-16 — End: 2017-07-16
  Administered 2017-07-16 (×2): 650 mg via ORAL
  Filled 2017-07-16: qty 2

## 2017-07-16 MED ORDER — CHLORHEXIDINE GLUCONATE 0.12 % MT SOLN
15.0000 mL | Freq: Two times a day (BID) | OROMUCOSAL | Status: DC
  Administered 2017-07-16 – 2017-07-20 (×8): 15 mL via OROMUCOSAL
  Filled 2017-07-16 (×8): qty 15

## 2017-07-16 MED ORDER — KETOROLAC TROMETHAMINE 30 MG/ML IJ SOLN
30.0000 mg | Freq: Once | INTRAMUSCULAR | Status: AC
  Administered 2017-07-16: 30 mg via INTRAVENOUS
  Filled 2017-07-16: qty 1

## 2017-07-16 MED ORDER — SIMVASTATIN 40 MG PO TABS
40.0000 mg | ORAL_TABLET | Freq: Every evening | ORAL | Status: DC
  Administered 2017-07-16 – 2017-07-19 (×4): 40 mg via ORAL
  Filled 2017-07-16 (×4): qty 1

## 2017-07-16 MED ORDER — ACETAMINOPHEN 325 MG PO TABS: 650.0000 mg | ORAL_TABLET | Freq: Four times a day (QID) | ORAL | Status: DC | PRN

## 2017-07-16 MED ORDER — ACETAMINOPHEN 325 MG PO TABS
650.0000 mg | ORAL_TABLET | Freq: Once | ORAL | Status: DC
  Filled 2017-07-16: qty 2

## 2017-07-16 MED ORDER — SODIUM CHLORIDE 0.9 % IV BOLUS (SEPSIS)
1000.0000 mL | Freq: Once | INTRAVENOUS | Status: AC
  Administered 2017-07-16: 1000 mL via INTRAVENOUS

## 2017-07-16 MED ORDER — ACETAMINOPHEN 325 MG PO TABS
650.0000 mg | ORAL_TABLET | Freq: Four times a day (QID) | ORAL | Status: DC | PRN
  Administered 2017-07-17 – 2017-07-20 (×8): 650 mg via ORAL
  Filled 2017-07-16 (×9): qty 2

## 2017-07-16 MED ORDER — ONDANSETRON HCL 4 MG/2ML IJ SOLN
4.0000 mg | Freq: Four times a day (QID) | INTRAMUSCULAR | Status: DC | PRN
  Administered 2017-07-16: 4 mg via INTRAVENOUS
  Filled 2017-07-16: qty 2

## 2017-07-16 MED ORDER — DIPHENHYDRAMINE HCL 50 MG/ML IJ SOLN
25.0000 mg | Freq: Once | INTRAMUSCULAR | Status: AC
  Administered 2017-07-16: 25 mg via INTRAVENOUS
  Filled 2017-07-16: qty 1

## 2017-07-16 MED ORDER — ACETAMINOPHEN 650 MG RE SUPP
650.0000 mg | Freq: Four times a day (QID) | RECTAL | Status: DC | PRN
Start: 2017-07-16 — End: 2017-07-16

## 2017-07-16 MED ORDER — DEXTROSE 5 % IV SOLN
2.0000 g | INTRAVENOUS | Status: DC
  Administered 2017-07-17 – 2017-07-20 (×4): 2 g via INTRAVENOUS
  Filled 2017-07-16 (×4): qty 2

## 2017-07-16 MED ORDER — VENLAFAXINE HCL ER 150 MG PO CP24
150.0000 mg | ORAL_CAPSULE | Freq: Every day | ORAL | Status: DC
  Administered 2017-07-16 – 2017-07-19 (×4): 150 mg via ORAL
  Filled 2017-07-16 (×4): qty 1

## 2017-07-16 MED ORDER — SODIUM CHLORIDE 0.9 % IV BOLUS (SEPSIS)
500.0000 mL | Freq: Once | INTRAVENOUS | Status: AC
  Administered 2017-07-16: 500 mL via INTRAVENOUS

## 2017-07-16 MED ORDER — ORAL CARE MOUTH RINSE
15.0000 mL | Freq: Two times a day (BID) | OROMUCOSAL | Status: DC
  Administered 2017-07-16 – 2017-07-17 (×3): 15 mL via OROMUCOSAL

## 2017-07-16 NOTE — ED Provider Notes (Signed)
[facility] DEPT Provider Note   CSN: [id number] Arrival date & time: 07/16/17  0456     History   Chief Complaint Chief Complaint  Patient presents with  . Urinary Frequency  . Fever    HPI Shelby Vazquez is a 66 y.o. female with a history of UTIs and an Interstim device placed in 05/18 for urinary retention who presents to the emergency department with fever, nausea, emesis, back pain, and urinary frequency.  She reports left-sided back pain with associated nausea x5 days. Her symptoms continued to worsen, and yesterday, she developed a fever of 101.5 at home and chills. She controlled her fever at home with ibuprofen, which resolved her fever. Last dose ~3:45 AM. She reports she awoke about 4:30 AM and has vomited 3-4 times. No hematemesis, CP, dyspnea, or abdominal pain.   Urology: Dr. Amalia Hailey  The history is provided by the patient. No language interpreter was used.    Past Medical History:  Diagnosis Date  . Abdominal aortic atherosclerosis ([place]) 10/2014   by CT scan  . B12 deficiency   . Chronic anxiety   . Chronic cough    cyclical cough syndrome  . Complication of anesthesia 9 yrs ago   awake and in severe pain with colonoscopy  . Concussion   . Diet-controlled diabetes mellitus ([place]) 02/22/2017   New dx 02/2017  . GERD (gastroesophageal reflux disease)    severe leading to chronic cough and hoarseness  . History of UTI   . Hyperlipidemia   . Hypertension   . IBS (irritable bowel syndrome)    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, PB-8 probiotic, healthy diet and exercise  . 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 Osteopenia 01/2015   T -0.8 spine, -1.7 hip  . Renal disorder    UTI  . Uterine prolapse   . Vocal cord paralysis, bilateral partial 2001   evaluated at [facility] - did not require surgery    Patient Active Problem List   Diagnosis Date Noted  . Acute pyelonephritis 07/16/2017  .  Diet-controlled diabetes mellitus ([place]) 02/22/2017  . Recurrent UTI 02/17/2017  . Urinary retention with incomplete bladder emptying 07/26/2016  . Bilateral hand pain 06/05/2016  . Advanced care planning/counseling discussion 02/12/2016  . Medicare annual wellness visit, initial 02/12/2016  . Left foot pain 08/16/2015  . Abdominal aortic atherosclerosis ([place]) 10/21/2014  . Lower abdominal pain 09/07/2014  . Trochanteric bursitis of left hip 02/09/2014  . Chronic cough - cyclical 13/24/4010  . Routine health maintenance 01/20/2012  . MDD (major depressive disorder), recurrent episode, moderate ([place]) 11/06/2011  . Itching 11/06/2011  . Headache in front of head 06/12/2011  . Vitamin B12 deficiency 11/19/2010  . HLD (hyperlipidemia) 11/19/2010  . GAD (generalized anxiety disorder) 11/19/2010  . Essential hypertension 11/19/2010  . GERD, SEVERE 11/19/2010  . IBS 11/19/2010  . Uterine prolapse 11/19/2010  . Osteopenia 11/19/2010  . PERSONAL HISTORY OF TROPHOBLASTIC DISEASE 11/19/2010    Past Surgical History:  Procedure Laterality Date  . BUNIONECTOMY     [place] '2000  . CATARACT EXTRACTION     right 01/2010, left may 2011  . COLONOSCOPY WITH PROPOFOL N/A 03/31/2014   sessile serrated adenomas, sigmoid diverticulosis, rpt 3 yrs Cleotis Nipper, MD)  . DILATION AND CURETTAGE OF UTERUS  '77   for molar pregnancy excision  . ESOPHAGOGASTRODUODENOSCOPY N/A 03/31/2014   mult gastric polyps, mild mucosal hemorrhages; ESOPHAGOGASTRODUODENOSCOPY (EGD);  Surgeon: Cleotis Nipper, MD  . ESOPHAGOGASTRODUODENOSCOPY ENDOSCOPY  april 2014    OB History    No data available       Home Medications    Prior to Admission medications   Medication Sig Start Date End Date Taking? Authorizing Provider  acetaminophen (TYLENOL) 500 MG tablet Take 1,000 mg by mouth every 6 (six) hours as needed for moderate pain.    Yes [provider]  Calcium Carbonate-Vitamin D 600-200 MG-UNIT CAPS  Take 1 capsule by mouth daily.   Yes [provider]  clidinium-chlordiazePOXIDE (LIBRAX) 5-2.5 MG capsule TAKE 1 CAPSULE BY MOUTH TWO TIMES DAILY 05/30/17  Yes Ria Bush, MD  famotidine (PEPCID) 40 MG tablet TAKE 2 TABLETS BY MOUTH AT  BEDTIME 02/18/17  Yes Ria Bush, MD  ibuprofen (ADVIL,MOTRIN) 200 MG tablet Take 600 mg by mouth every 6 (six) hours as needed for moderate pain.   Yes [provider]  loratadine (CLARITIN) 10 MG tablet Take 10 mg by mouth daily.    Yes [provider]  Melatonin 10 MG TBDP Take 20 mg by mouth daily.   Yes [provider]  methenamine (HIPREX) 1 g tablet Take 1 g by mouth 2 (two) times daily with a meal.  01/08/17  Yes [provider]  Multiple Vitamins-Minerals (EQ MULTIVITAMINS ADULT GUMMY PO) Take 2 tablets by mouth daily.   Yes [provider]  PRESCRIPTION MEDICATION Apply topically 2 (two) times daily. Estradiol Progesterone cream 0.05-30 mg/ml   Yes [provider]  Probiotic Product (PROBIOTIC PO) Take 2 capsules by mouth 2 (two) times daily.    Yes [provider]  RABEprazole (ACIPHEX) 20 MG tablet Take 20 mg by mouth daily.   Yes [provider]  ranitidine (ZANTAC) 150 MG tablet Take 150 mg by mouth 2 (two) times daily as needed for heartburn.    Yes [provider]  simvastatin (ZOCOR) 40 MG tablet TAKE 1 TABLET BY MOUTH  EVERY EVENING 02/18/17  Yes Ria Bush, MD  triamterene-hydrochlorothiazide (DYAZIDE) 37.5-25 MG capsule TAKE 1 CAPSULE BY MOUTH  EVERY MORNING 02/18/17  Yes Ria Bush, MD  venlafaxine XR (EFFEXOR-XR) 75 MG 24 hr capsule take 2 capsules by mouth once daily WITH BREAKFAST Patient taking differently: take 2 capsules by mouth once daily 03/24/17  Yes Ria Bush, MD  CARAFATE 1 GM/10ML suspension take 10 milliliters by mouth four times a day if needed Patient not taking: Reported on 07/16/2017 03/12/17   Ria Bush, MD   cyanocobalamin (,VITAMIN B-12,) 1000 MCG/ML injection INJECT INTRAMUSCULARLY 1 ML EVERY 30 DAYS Patient not taking: Reported on 07/16/2017 05/30/17   Ria Bush, MD  ondansetron (ZOFRAN-ODT) 4 MG disintegrating tablet dissolve 1 tablet ON TONGUE every 8 hours if needed for nausea and vomiting Patient not taking: Reported on 07/16/2017 03/19/17   Ria Bush, MD  promethazine (PHENERGAN) 25 MG tablet Take 1 tablet (25 mg total) by mouth every 8 (eight) hours as needed for nausea or vomiting. Patient not taking: Reported on 07/16/2017 11/03/14   Ria Bush, MD    Family History Family History  Problem Relation Age of Onset  . Cancer Father   . Heart disease Mother   . Hyperlipidemia Mother   . Hypertension Mother   . Heart attack Mother   . Cancer Maternal Aunt        breast  . Heart disease Maternal Grandmother   . Heart disease Maternal Grandfather   . Heart disease Paternal Grandmother   .  Heart disease Paternal Grandfather   . Hyperlipidemia Brother   . Hypertension Brother   . Diabetes Neg Hx     Social History Social History  Substance Use Topics  . Smoking status: Never Smoker  . Smokeless tobacco: Never Used  . Alcohol use Yes     Comment: twice a year     Allergies   Augmentin [amoxicillin-pot clavulanate] and Erythromycin   Review of Systems Review of Systems  Constitutional: Positive for chills and fever.  Respiratory: Negative for shortness of breath.   Cardiovascular: Negative for chest pain.  Gastrointestinal: Positive for diarrhea (chronic), nausea and vomiting.  Genitourinary: Positive for frequency. Negative for dysuria, flank pain, hematuria and urgency.  Musculoskeletal: Positive for back pain. Negative for arthralgias.     Physical Exam Updated Vital Signs BP (!) 94/55 (BP Location: Right Arm)   Pulse 93   Temp 99.1 F (37.3 C) (Oral)   Resp 17   Ht 5\' 4"  (1.626 m)   Wt 70.5 kg (155 lb 6.8 oz)   SpO2 94%   BMI 26.68 kg/m    Physical Exam  Constitutional: No distress.  Uncomfortable appearing.  HENT:  Head: Normocephalic.  Eyes: Conjunctivae are normal. No scleral icterus.  Neck: Neck supple.  Cardiovascular: Normal rate, regular rhythm and normal heart sounds.  Exam reveals no gallop and no friction rub.   No murmur heard. Pulmonary/Chest: Effort normal and breath sounds normal. No respiratory distress. She has no wheezes. She has no rales.  Abdominal: Soft. She exhibits no distension. There is no tenderness. There is no rebound and no guarding.  Left CVA tenderness. No TTP over right CVA.  Neurological: She is alert.  Skin: Skin is warm. No rash noted. She is not diaphoretic.  Psychiatric: Her behavior is normal.  Nursing note and vitals reviewed.  ED Treatments / Results  Labs (all labs ordered are listed, but only abnormal results are displayed) Labs Reviewed  URINALYSIS, ROUTINE W REFLEX MICROSCOPIC - Abnormal; Notable for the following:       Result Value   APPearance CLOUDY (*)    Hgb urine dipstick MODERATE (*)    Protein, ur 100 (*)    Leukocytes, UA LARGE (*)    Bacteria, UA MANY (*)    Squamous Epithelial / LPF 0-5 (*)    All other components within normal limits  CBC - Abnormal; Notable for the following:    WBC 11.3 (*)    Hemoglobin 11.8 (*)    HCT 34.8 (*)    All other components within normal limits  COMPREHENSIVE METABOLIC PANEL - Abnormal; Notable for the following:    Sodium 133 (*)    Potassium 3.1 (*)    Chloride 97 (*)    Glucose, Bld 137 (*)    All other components within normal limits  CBG MONITORING, ED - Abnormal; Notable for the following:    Glucose-Capillary 136 (*)    All other components within normal limits  URINE CULTURE  CULTURE, BLOOD (ROUTINE X 2)  CULTURE, BLOOD (ROUTINE X 2)  URINE CULTURE  MAGNESIUM  TSH  HEMOGLOBIN A1C  COMPREHENSIVE METABOLIC PANEL  CBC  I-STAT CG4 LACTIC ACID, ED    EKG  EKG Interpretation None        Radiology No results found.  Procedures Procedures (including critical care time)  Medications Ordered in ED Medications  sucralfate (CARAFATE) 1 GM/10ML suspension 1 g (not administered)  simvastatin (ZOCOR) tablet 40 mg (not administered)  pantoprazole (PROTONIX) EC  tablet 40 mg (not administered)  loratadine (CLARITIN) tablet 10 mg (not administered)  cefTRIAXone (ROCEPHIN) 2 g in dextrose 5 % 50 mL IVPB (not administered)  enoxaparin (LOVENOX) injection 40 mg (not administered)  0.9 % NaCl with KCl 40 mEq / L  infusion (not administered)  acetaminophen (TYLENOL) tablet 650 mg (650 mg Oral Given 07/16/17 1149)    Or  acetaminophen (TYLENOL) suppository 650 mg ( Rectal See Alternative 07/16/17 1149)  ondansetron (ZOFRAN) tablet 4 mg (not administered)    Or  ondansetron (ZOFRAN) injection 4 mg (not administered)  levalbuterol (XOPENEX) nebulizer solution 0.63 mg (not administered)  insulin aspart (novoLOG) injection 0-9 Units (0 Units Subcutaneous Refused 07/16/17 1405)  sodium chloride 0.9 % bolus 1,000 mL (0 mLs Intravenous Stopped 07/16/17 0820)  ondansetron (ZOFRAN) injection 4 mg (4 mg Intravenous Given 07/16/17 0820)  ondansetron (ZOFRAN) injection 4 mg (4 mg Intravenous Given 07/16/17 1101)  fentaNYL (SUBLIMAZE) injection 12.5 mcg (12.5 mcg Intravenous Given 07/16/17 1101)  potassium chloride SA (K-DUR,KLOR-CON) CR tablet 40 mEq (40 mEq Oral Given 07/16/17 1151)     Initial Impression / Assessment and Plan / ED Course  I have reviewed the triage vital signs and the nursing notes.  Pertinent labs & imaging results that were available during my care of the patient were reviewed by me and considered in my medical decision making (see chart for details).     2 her old female presenting with urinary frequency and fever. The patient was seen and evaluated with Dr. Ellender Hose, attending physician. Tachycardic to 100-110. WBC 11.3. Febrile to 100.5, orally. UA consistent with UTI.  The patient's most recent urine culture was in June 2018, which was sensitive to ceftriaxone. One dose of ceftriaxone given in the ET along with Zofran for nausea. IV fluid bolus given. On recheck, the patient appears to be in more discomfort and is requesting pain medication. One dose of fentanyl given. On patient recheck, the patient continues to appear uncomfortable. Participated in shared decision making conversation with the patient and her husband who feel the patient would benefit from continued inpatient treatment. Consulted the hospitalist team and spoke with Dr. Allyson Sabal who will admit the patient for continued treatment and evaluation. The patient appears reasonably stabilized for admission considering the current resources, flow, and capabilities available in the ED at this time, and I doubt any other [facility] requiring further screening and/or treatment in the ED prior to admission.   Final Clinical Impressions(s) / ED Diagnoses   Final diagnoses:  Pyelonephritis    New Prescriptions Current Discharge Medication List       Joanne Gavel, PA-C 07/16/17 1706    Duffy Bruce, MD 07/20/17 1944

## 2017-07-16 NOTE — ED Triage Notes (Signed)
Patient reports starting on Saturday she developed nausea that progressed to increase frequency and fever at home. 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 Also reports she self caths herself. IBUPROFEN 3 tablets at 10:30, given at home.

## 2017-07-16 NOTE — ED Notes (Signed)
Call report to [facility] at [phone] at [phone].

## 2017-07-16 NOTE — Progress Notes (Signed)
PHARMACY - PHYSICIAN COMMUNICATION CRITICAL VALUE ALERT - BLOOD CULTURE IDENTIFICATION (BCID)  Results for orders placed or performed during the hospital encounter of 07/16/17  Blood Culture ID Panel (Reflexed) (Collected: 07/16/2017  8:07 AM)  Result Value Ref Range   Enterococcus species NOT DETECTED NOT DETECTED   Listeria monocytogenes NOT DETECTED NOT DETECTED   Staphylococcus species NOT DETECTED NOT DETECTED   Staphylococcus aureus NOT DETECTED NOT DETECTED   Streptococcus species NOT DETECTED NOT DETECTED   Streptococcus agalactiae NOT DETECTED NOT DETECTED   Streptococcus pneumoniae NOT DETECTED NOT DETECTED   Streptococcus pyogenes NOT DETECTED NOT DETECTED   Acinetobacter baumannii NOT DETECTED NOT DETECTED   Enterobacteriaceae species DETECTED (A) NOT DETECTED   Enterobacter cloacae complex NOT DETECTED NOT DETECTED   Escherichia coli DETECTED (A) NOT DETECTED   Klebsiella oxytoca NOT DETECTED NOT DETECTED   Klebsiella pneumoniae NOT DETECTED NOT DETECTED   Proteus species NOT DETECTED NOT DETECTED   Serratia marcescens NOT DETECTED NOT DETECTED   Carbapenem resistance NOT DETECTED NOT DETECTED   Haemophilus influenzae NOT DETECTED NOT DETECTED   Neisseria meningitidis NOT DETECTED NOT DETECTED   Pseudomonas aeruginosa NOT DETECTED NOT DETECTED   Candida albicans NOT DETECTED NOT DETECTED   Candida glabrata NOT DETECTED NOT DETECTED   Candida krusei NOT DETECTED NOT DETECTED   Candida parapsilosis NOT DETECTED NOT DETECTED   Candida tropicalis NOT DETECTED NOT DETECTED    Name of physician (or Provider) Contacted: none  Changes to prescribed antibiotics required: none, already on recommended Ceftriaxone 2gm iv q24hr .  Nani Skillern Crowford 07/16/2017  11:28 PM

## 2017-07-16 NOTE — H&P (Addendum)
[facility] History and Physical  Shelby Vazquez ELF:[id number] DOB: [birth year] DOA: 07/16/2017  Referring physician:   PCP: Shelby Bush, MD   Chief Complaint:  Nausea  HPI:   66 year old female with a history of gastroesophageal reflux, hypertension, prediabetes, recurrent urinary tract infection, urinary retention followed by urologist Shelby Vazquez at [facility], status post InterStim placement   For urinary retention , with improvement in her PVR volumes per urology notes from [facility] , who presents with multiple complaints including nausea with progressive weakness 5 days, increased urinary frequency, fever at home up to 101.5. Patient self catheterizes herself due to chronic urinary retention. Her fever has been not been controlled with ibuprofen. Patient is now vomiting 3-4 times.  ED course BP 104/76 (BP Location: Right Arm)   Pulse (!) 110   Temp 98.1 F (36.7 C) (Oral)   Resp 20   Ht 5\' 4"  (1.626 m)   Wt 68 kg (150 lb)   SpO2 99%   BMI 25.75 kg/m  Sodium 133, potassium 3.1, glucose 137, creatinine 0.9, lactic acid 1.0, white count 11.3 UA grossly positive Patient admitted for pyelonephritis      Review of Systems: negative for the following  Constitutional: Positive for chills and fever.  Respiratory: Negative for shortness of breath.   Cardiovascular: Negative for chest pain.  Gastrointestinal: Positive for diarrhea (chronic), nausea and vomiting.  Genitourinary: Positive for frequency. Negative for dysuria, flank pain, hematuria and urgency.  Musculoskeletal: Positive for back pain. Negative for arthralgias. Skin: Denies pallor, rash and wound.  Neurological: Denies dizziness, seizures, syncope, weakness, light-headedness, numbness and headaches.  Hematological: Denies adenopathy. Easy bruising, personal or family bleeding history  Psychiatric/Behavioral: Denies suicidal ideation, mood changes, confusion, nervousness, sleep disturbance and agitation        Past Medical History:  Diagnosis Date  . Abdominal aortic atherosclerosis ([place]) 10/2014   by CT scan  . B12 deficiency   . Chronic anxiety   . Chronic cough    cyclical cough syndrome  . Complication of anesthesia 9 yrs ago   awake and in severe pain with colonoscopy  . Concussion   . Diet-controlled diabetes mellitus ([place]) 02/22/2017   New dx 02/2017  . GERD (gastroesophageal reflux disease)    severe leading to chronic cough and hoarseness  . History of UTI   . Hyperlipidemia   . Hypertension   . IBS (irritable bowel syndrome)    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, PB-8 probiotic, healthy diet and exercise  . 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 Osteopenia 01/2015   T -0.8 spine, -1.7 hip  . Renal disorder    UTI  . Uterine prolapse   . Vocal cord paralysis, bilateral partial 2001   evaluated at [facility] - did not require surgery     Past Surgical History:  Procedure Laterality Date  . BUNIONECTOMY     [place] '2000  . CATARACT EXTRACTION     right 01/2010, left may 2011  . COLONOSCOPY WITH PROPOFOL N/A 03/31/2014   sessile serrated adenomas, sigmoid diverticulosis, rpt 3 yrs Shelby Nipper, MD)  . DILATION AND CURETTAGE OF UTERUS  '77   for molar pregnancy excision  . ESOPHAGOGASTRODUODENOSCOPY N/A 03/31/2014   mult gastric polyps, mild mucosal hemorrhages; ESOPHAGOGASTRODUODENOSCOPY (EGD);  Surgeon: Shelby Nipper, MD  . ESOPHAGOGASTRODUODENOSCOPY ENDOSCOPY  april 2014      Social History:  reports that she has never smoked.  She has never used smokeless tobacco. She reports that she drinks alcohol. She reports that she does not use drugs.    Allergies  Allergen Reactions  . Augmentin [Amoxicillin-Pot Clavulanate]     Severe diarrhea, rash, worsened acid reflux  . Erythromycin Rash    Family History  Problem Relation Age of Onset  . Cancer Father   . Heart disease Mother   .  Hyperlipidemia Mother   . Hypertension Mother   . Heart attack Mother   . Cancer Maternal Aunt        breast  . Heart disease Maternal Grandmother   . Heart disease Maternal Grandfather   . Heart disease Paternal Grandmother   . Heart disease Paternal Grandfather   . Hyperlipidemia Brother   . Hypertension Brother   . Diabetes Neg Hx          Prior to Admission medications   Medication Sig Start Date End Date Taking? Authorizing Provider  acetaminophen (TYLENOL) 500 MG tablet Take 1,000 mg by mouth every 6 (six) hours as needed for moderate pain.    Yes [provider]  Calcium Carbonate-Vitamin D 600-200 MG-UNIT CAPS Take 1 capsule by mouth daily.   Yes [provider]  clidinium-chlordiazePOXIDE (LIBRAX) 5-2.5 MG capsule TAKE 1 CAPSULE BY MOUTH TWO TIMES DAILY 05/30/17  Yes Shelby Bush, MD  famotidine (PEPCID) 40 MG tablet TAKE 2 TABLETS BY MOUTH AT  BEDTIME 02/18/17  Yes Shelby Bush, MD  ibuprofen (ADVIL,MOTRIN) 200 MG tablet Take 600 mg by mouth every 6 (six) hours as needed for moderate pain.   Yes [provider]  loratadine (CLARITIN) 10 MG tablet Take 10 mg by mouth daily.    Yes [provider]  Melatonin 10 MG TBDP Take 20 mg by mouth daily.   Yes [provider]  methenamine (HIPREX) 1 g tablet Take 1 g by mouth 2 (two) times daily with a meal.  01/08/17  Yes [provider]  Multiple Vitamins-Minerals (EQ MULTIVITAMINS ADULT GUMMY PO) Take 2 tablets by mouth daily.   Yes [provider]  PRESCRIPTION MEDICATION Apply topically 2 (two) times daily. Estradiol Progesterone cream 0.05-30 mg/ml   Yes [provider]  Probiotic Product (PROBIOTIC PO) Take 2 capsules by mouth 2 (two) times daily.    Yes [provider]  RABEprazole (ACIPHEX) 20 MG tablet Take 20 mg by mouth daily.   Yes [provider]  ranitidine (ZANTAC) 150 MG tablet Take 150 mg by mouth 2 (two) times daily as  needed for heartburn.    Yes [provider]  simvastatin (ZOCOR) 40 MG tablet TAKE 1 TABLET BY MOUTH  EVERY EVENING 02/18/17  Yes Shelby Bush, MD  triamterene-hydrochlorothiazide (DYAZIDE) 37.5-25 MG capsule TAKE 1 CAPSULE BY MOUTH  EVERY MORNING 02/18/17  Yes Shelby Bush, MD  venlafaxine XR (EFFEXOR-XR) 75 MG 24 hr capsule take 2 capsules by mouth once daily WITH BREAKFAST Patient taking differently: take 2 capsules by mouth once daily 03/24/17  Yes Shelby Bush, MD  CARAFATE 1 GM/10ML suspension take 10 milliliters by mouth four times a day if needed Patient not taking: Reported on 07/16/2017 03/12/17   Shelby Bush, MD  cyanocobalamin (,VITAMIN B-12,) 1000 MCG/ML injection INJECT INTRAMUSCULARLY 1 ML EVERY 30 DAYS Patient not taking: Reported on 07/16/2017 05/30/17   Shelby Bush, MD  ondansetron (ZOFRAN-ODT) 4 MG disintegrating tablet dissolve 1 tablet ON TONGUE every 8 hours if needed for nausea and vomiting Patient not taking: Reported on 07/16/2017 03/19/17  Shelby Bush, MD  promethazine (PHENERGAN) 25 MG tablet Take 1 tablet (25 mg total) by mouth every 8 (eight) hours as needed for nausea or vomiting. Patient not taking: Reported on 07/16/2017 11/03/14   Shelby Bush, MD     Physical Exam: Vitals:   07/16/17 0533 07/16/17 0805  BP: 104/76 115/71  Pulse: (!) 110 97  Resp: 20 16  Temp: 98.1 F (36.7 C)   TempSrc: Oral   SpO2: 99% 96%  Weight: 68 kg (150 lb)   Height: 5\' 4"  (1.626 m)         Vitals:   07/16/17 0533 07/16/17 0805  BP: 104/76 115/71  Pulse: (!) 110 97  Resp: 20 16  Temp: 98.1 F (36.7 C)   TempSrc: Oral   SpO2: 99% 96%  Weight: 68 kg (150 lb)   Height: 5\' 4"  (1.626 m)    Constitutional: NAD, calm, comfortable Eyes: PERRL, lids and conjunctivae normal ENMT: Mucous membranes are moist. Posterior pharynx clear of any exudate or lesions.Normal dentition.  Neck: normal, supple, no masses, no thyromegaly Respiratory:  clear to auscultation bilaterally, no wheezing, no crackles. Normal respiratory effort. No accessory muscle use.  Cardiovascular: Regular rate and rhythm, no murmurs / rubs / gallops. No extremity edema. 2+ pedal pulses. No carotid bruits.  Abdomen:Left CVA tenderness. No TTP over right CVA.  Musculoskeletal: no clubbing / cyanosis. No joint deformity upper and lower extremities. Good ROM, no contractures. Normal muscle tone.  Skin: no rashes, lesions, ulcers. No induration Neurologic: CN 2-12 grossly intact. Sensation intact, DTR normal. Strength 5/5 in all 4.  Psychiatric: Normal judgment and insight. Alert and oriented x 3. Normal mood.     Labs on Admission: I have personally reviewed following labs and imaging studies  CBC:  Recent Labs Lab 07/16/17 0700  WBC 11.3*  HGB 11.8*  HCT 34.8*  MCV 87.2  PLT 443    Basic Metabolic Panel:  Recent Labs Lab 07/16/17 0700  NA 133*  K 3.1*  CL 97*  CO2 25  GLUCOSE 137*  BUN 17  CREATININE 0.90  CALCIUM 9.0    GFR: Estimated Creatinine Clearance: 58.2 mL/min (by C-G formula based on SCr of 0.9 mg/dL).  Liver Function Tests:  Recent Labs Lab 07/16/17 0700  AST 32  ALT 24  ALKPHOS 47  BILITOT 1.1  PROT 6.8  ALBUMIN 3.8   No results for input(s): LIPASE, AMYLASE in the last 168 hours. No results for input(s): AMMONIA in the last 168 hours.  Coagulation Profile: No results for input(s): INR, PROTIME in the last 168 hours. No results for input(s): DDIMER in the last 72 hours.  Cardiac Enzymes: No results for input(s): CKTOTAL, CKMB, CKMBINDEX, TROPONINI in the last 168 hours.  BNP (last 3 results) No results for input(s): PROBNP in the last 8760 hours.  HbA1C: No results for input(s): HGBA1C in the last 72 hours. Lab Results  Component Value Date   HGBA1C 6.5 02/21/2017     CBG: No results for input(s): GLUCAP in the last 168 hours.  Lipid Profile: No results for input(s): CHOL, HDL, LDLCALC, TRIG,  CHOLHDL, LDLDIRECT in the last 72 hours.  Thyroid Function Tests: No results for input(s): TSH, T4TOTAL, FREET4, T3FREE, THYROIDAB in the last 72 hours.  Anemia Panel: No results for input(s): VITAMINB12, FOLATE, FERRITIN, TIBC, IRON, RETICCTPCT in the last 72 hours.  Urine analysis:    Component Value Date/Time   COLORURINE YELLOW 07/16/2017 0630   APPEARANCEUR CLOUDY (A) 07/16/2017 0630  LABSPEC 1.008 07/16/2017 0630   PHURINE 5.0 07/16/2017 0630   GLUCOSEU NEGATIVE 07/16/2017 0630   GLUCOSEU NEGATIVE 08/24/2012 1703   HGBUR MODERATE (A) 07/16/2017 0630   BILIRUBINUR NEGATIVE 07/16/2017 0630   BILIRUBINUR Negative 02/12/2016 1208   KETONESUR NEGATIVE 07/16/2017 0630   PROTEINUR 100 (A) 07/16/2017 0630   UROBILINOGEN 0.2 02/12/2016 1208   UROBILINOGEN 0.2 08/24/2012 1703   NITRITE NEGATIVE 07/16/2017 0630   LEUKOCYTESUR LARGE (A) 07/16/2017 0630    Sepsis Labs: @LABRCNTIP (procalcitonin:4,lacticidven:4) )No results found for this or any previous visit (from the past 240 hour(s)).       Radiological Exams on Admission: No results found. No results found.    EKG: Independently reviewed.    Assessment/Plan Principal Problem:   Acute pyelonephritis In the setting of chronic urinary retention Admit to telemetry Started on Rocephin 2 g IV daily Follow urine culture, blood culture Continue to check post void residuals to ensure that the patient is emptying her bladder, in and out cath Patient is on hiprex at home for chronic prophylaxis Unable to keep oral stone therefore will need IV antibiotics for 2-3 days   Hypertension-hold Dyazide due to electrolyte abnormalities and dehydration     HLD (hyperlipidemia)-continue statin    GAD (generalized anxiety disorder)-continue Effexor     Uterine prolapse/chronic urinary retention-followed by urology at [facility]    MDD (major depressive disorder), recurrent episode, moderate (HCC)-continue Effexor    Gastroesophageal reflux disease-continue PPI and avoid NSAIDs  Vitamin B-12 deficiency of vitamin B-12 injections every 30 days at home       Diet-controlled diabetes mellitus (HCC)-will check hemoglobin A1c, last hemoglobin A1c was 6.5, will start SSI          DVT prophylaxis: Lovenox      Code Status Orders Full code        consults called:  Family Communication: Admission, patients condition and plan of care including tests being ordered have been discussed with the patient  who indicates understanding and agree with the plan and Code Status  Admission status: inpatient  Disposition plan: Further plan will depend as patient's clinical course evolves and further radiologic and laboratory data become available. Likely home when stable   At the time of admission, it appears that the appropriate admission status for this patient is INPATIENT .Thisis judged to be reasonable and necessary in order to provide the required intensity of service to ensure the patient's safetygiven thepresenting symptoms, physical exam findings, and initial radiographic and laboratory data in the context of their chronic comorbidities.   Reyne Dumas MD [facility] Pager [phone]  If 7PM-7AM, please contact night-coverage [email or web address] Password [email or web address]  07/16/2017, 10:33 AM

## 2017-07-17 DIAGNOSIS — N10 Acute tubulo-interstitial nephritis: Secondary | ICD-10-CM

## 2017-07-17 DIAGNOSIS — R1030 Lower abdominal pain, unspecified: Secondary | ICD-10-CM

## 2017-07-17 DIAGNOSIS — F411 Generalized anxiety disorder: Secondary | ICD-10-CM

## 2017-07-17 DIAGNOSIS — I10 Essential (primary) hypertension: Secondary | ICD-10-CM

## 2017-07-17 LAB — CBC
HCT: 29.7 % — ABNORMAL LOW (ref 36.0–46.0)
Hemoglobin: 10.1 g/dL — ABNORMAL LOW (ref 12.0–15.0)
MCH: 30.5 pg (ref 26.0–34.0)
MCHC: 34 g/dL (ref 30.0–36.0)
MCV: 89.7 fL (ref 78.0–100.0)
PLATELETS: 219 10*3/uL (ref 150–400)
RBC: 3.31 MIL/uL — ABNORMAL LOW (ref 3.87–5.11)
RDW: 14.3 % (ref 11.5–15.5)
WBC: 11.8 10*3/uL — AB (ref 4.0–10.5)

## 2017-07-17 LAB — COMPREHENSIVE METABOLIC PANEL
ALK PHOS: 53 U/L (ref 38–126)
ALT: 23 U/L (ref 14–54)
AST: 26 U/L (ref 15–41)
Albumin: 3 g/dL — ABNORMAL LOW (ref 3.5–5.0)
Anion gap: 6 (ref 5–15)
BILIRUBIN TOTAL: 0.7 mg/dL (ref 0.3–1.2)
BUN: 15 mg/dL (ref 6–20)
CALCIUM: 8.4 mg/dL — AB (ref 8.9–10.3)
CHLORIDE: 105 mmol/L (ref 101–111)
CO2: 24 mmol/L (ref 22–32)
CREATININE: 1.13 mg/dL — AB (ref 0.44–1.00)
GFR calc Af Amer: 57 mL/min — ABNORMAL LOW (ref >60)
GFR calc non Af Amer: 50 mL/min — ABNORMAL LOW (ref >60)
Glucose, Bld: 128 mg/dL — ABNORMAL HIGH (ref 65–99)
Potassium: 4.4 mmol/L (ref 3.5–5.1)
Sodium: 135 mmol/L (ref 135–145)
TOTAL PROTEIN: 5.7 g/dL — AB (ref 6.5–8.1)

## 2017-07-17 LAB — TSH: TSH: 5.898 u[IU]/mL — ABNORMAL HIGH (ref 0.350–4.500)

## 2017-07-17 LAB — GLUCOSE, CAPILLARY
GLUCOSE-CAPILLARY: 105 mg/dL — AB (ref 65–99)
GLUCOSE-CAPILLARY: 142 mg/dL — AB (ref 65–99)
GLUCOSE-CAPILLARY: 116 mg/dL — AB (ref 65–99)
Glucose-Capillary: 105 mg/dL — ABNORMAL HIGH (ref 65–99)

## 2017-07-17 MED ORDER — VENLAFAXINE HCL ER 150 MG PO CP24: 150.0000 mg | ORAL_CAPSULE | Freq: Every day | ORAL | Status: DC

## 2017-07-17 MED ORDER — CILIDINIUM-CHLORDIAZEPOXIDE 2.5-5 MG PO CAPS
1.0000 | ORAL_CAPSULE | Freq: Two times a day (BID) | ORAL | Status: DC
  Administered 2017-07-17 – 2017-07-20 (×6): 1 via ORAL
  Filled 2017-07-17 (×7): qty 1

## 2017-07-17 MED ORDER — ALPRAZOLAM 0.5 MG PO TABS: 0.5000 mg | ORAL_TABLET | Freq: Two times a day (BID) | ORAL | Status: DC | PRN

## 2017-07-17 NOTE — Progress Notes (Signed)
PROGRESS NOTE    Shelby Vazquez  VZC:[id number] DOB: [birth year] DOA: 07/16/2017 PCP: Ria Bush, MD    Brief Narrative:   66 year old female with a history of gastroesophageal reflux, hypertension, prediabetes, recurrent urinary tract infection, urinary retention followed by urologist [name] at [facility], status post InterStim placement   For urinary retention , with improvement in her PVR volumes per urology notes from [facility] , who presents with multiple complaints including nausea with progressive weakness 5 days, increased urinary frequency, fever at home up to 101.5. Patient self catheterizes herself due to chronic urinary retention. Her fever has been not been controlled with ibuprofen. In the emergency department, patient was noted to have findings suggestive of UTI. Patient was admitted for suspected pyelonephritis.  Assessment & Plan:   Principal Problem:   Acute pyelonephritis Active Problems:   HLD (hyperlipidemia)   GAD (generalized anxiety disorder)   Essential hypertension   Uterine prolapse   MDD (major depressive disorder), recurrent episode, moderate (HCC)   Lower abdominal pain   Recurrent UTI   Diet-controlled diabetes mellitus (HCC)  Principal Problem:   Acute pyelonephritis In the setting of chronic urinary retention -Patient has been continued on IV Rocephin. -Blood and urine cultures are positive for Escherichia coli. - patient noted be febrile overnight, thus will continue IV antibiotics. -Await results of sensitivities.  Hypertension -Continuing to hold Dyazide due to electrolyte abnormalities and dehydration     HLD (hyperlipidemia) -continue statin as tolerated    GAD (generalized anxiety disorder) -Patient is continued on Effexor -Presently stable     Uterine prolapse/chronic urinary retention -Patient has been followed by urology at [facility] at present    MDD (major depressive disorder), recurrent episode,  moderate ([place]) -Patient is continued on Effexor per above   Gastroesophageal reflux disease -continue PPI and avoid NSAIDs as tolerated  Vitamin B-12 deficiency  -patient receives vitamin B-12 injections every 30 days at home     Diet-controlled diabetes mellitus (HCC) -Hemoglobin A1c of 6.1 improved from previous hemoglobin A1c of 6.5 -We'll continue  SSI  DVT prophylaxis: Lovenox subQ Code Status: Full Family Communication: Pt in room, family not at bedside Disposition Plan: Uncertain at this time  Consultants:     Procedures:     Antimicrobials: Anti-infectives    Start     Dose/Rate Route Frequency Ordered Stop   07/17/17 0800  cefTRIAXone (ROCEPHIN) 2 g in dextrose 5 % 50 mL IVPB     2 g 100 mL/hr over 30 Minutes Intravenous Every 24 hours 07/16/17 1029     07/16/17 0730  cefTRIAXone (ROCEPHIN) 1 g in dextrose 5 % 50 mL IVPB  Status:  Discontinued     1 g 100 mL/hr over 30 Minutes Intravenous Every 24 hours 07/16/17 0726 07/16/17 1029       Subjective: No complaints. Feels generally weak this morning  Objective: Vitals:   07/16/17 1855 07/16/17 2058 07/16/17 2301 07/17/17 0707  BP:  (!) 104/55  117/71  Pulse:  (!) 103  91  Resp:  20  20  Temp: (!) 101.1 F (38.4 C) (!) 101 F (38.3 C) 99.5 F (37.5 C) 99.1 F (37.3 C)  TempSrc: Oral Oral Oral Oral  SpO2:  91%  95%  Weight:      Height:        Intake/Output Summary (Last 24 hours) at 07/17/17 1608 Last data filed at 07/17/17 1253  Gross per 24 hour  Intake  1375 ml  Output             1215 ml  Net              160 ml   Filed Weights   07/16/17 0533 07/16/17 1528  Weight: 68 kg (150 lb) 70.5 kg (155 lb 6.8 oz)    Examination:  General exam: Appears calm and comfortable  Respiratory system: Clear to auscultation. Respiratory effort normal. Cardiovascular system: S1 & S2 heard, RRR Gastrointestinal system: Abdomen is nondistended, soft and nontender. No organomegaly or masses  felt. Normal bowel sounds heard. Central nervous system: Alert and oriented. No focal neurological deficits. Extremities: Symmetric 5 x 5 power. Skin: No rashes, lesions Psychiatry: Judgement and insight appear normal. Mood & affect appropriate.   Data Reviewed: I have personally reviewed following labs and imaging studies  CBC:  Recent Labs Lab 07/16/17 0700 07/17/17 0546  WBC 11.3* 11.8*  HGB 11.8* 10.1*  HCT 34.8* 29.7*  MCV 87.2 89.7  PLT 255 884   Basic Metabolic Panel:  Recent Labs Lab 07/16/17 0700 07/16/17 1348 07/17/17 0546  NA 133*  --  135  K 3.1*  --  4.4  CL 97*  --  105  CO2 25  --  24  GLUCOSE 137*  --  128*  BUN 17  --  15  CREATININE 0.90  --  1.13*  CALCIUM 9.0  --  8.4*  MG  --  1.9  --    GFR: Estimated Creatinine Clearance: 47.2 mL/min (A) (by C-G formula based on SCr of 1.13 mg/dL (H)). Liver Function Tests:  Recent Labs Lab 07/16/17 0700 07/17/17 0546  AST 32 26  ALT 24 23  ALKPHOS 47 53  BILITOT 1.1 0.7  PROT 6.8 5.7*  ALBUMIN 3.8 3.0*   No results for input(s): LIPASE, AMYLASE in the last 168 hours. No results for input(s): AMMONIA in the last 168 hours. Coagulation Profile: No results for input(s): INR, PROTIME in the last 168 hours. Cardiac Enzymes: No results for input(s): CKTOTAL, CKMB, CKMBINDEX, TROPONINI in the last 168 hours. BNP (last 3 results) No results for input(s): PROBNP in the last 8760 hours. HbA1C:  Recent Labs  07/16/17 1355  HGBA1C 6.1*   CBG:  Recent Labs Lab 07/16/17 1309 07/16/17 1641 07/16/17 2121 07/17/17 0749 07/17/17 1227  GLUCAP 136* 116* 132* 105* 142*   Lipid Profile: No results for input(s): CHOL, HDL, LDLCALC, TRIG, CHOLHDL, LDLDIRECT in the last 72 hours. Thyroid Function Tests:  Recent Labs  07/17/17 0546  TSH 5.898*   Anemia Panel: No results for input(s): VITAMINB12, FOLATE, FERRITIN, TIBC, IRON, RETICCTPCT in the last 72 hours. Sepsis Labs:  Recent Labs Lab  07/16/17 0710  LATICACIDVEN 1.01    Recent Results (from the past 240 hour(s))  Urine C&S     Status: Abnormal (Preliminary result)   Collection Time: 07/16/17  6:30 AM  Result Value Ref Range Status   Specimen Description URINE, CLEAN CATCH  Final   Special Requests NONE  Final   Culture >=100,000 COLONIES/mL ESCHERICHIA COLI (A)  Final   Report Status PENDING  Incomplete  Blood culture (routine x 2)     Status: None (Preliminary result)   Collection Time: 07/16/17  7:00 AM  Result Value Ref Range Status   Specimen Description BLOOD LEFT HAND  Final   Special Requests   Final    BOTTLES DRAWN AEROBIC AND ANAEROBIC Blood Culture adequate volume   Culture   Final  NO GROWTH 1 DAY Performed at [facility], [place] [address] [place], [place] [address]    Report Status PENDING  Incomplete  Blood culture (routine x 2)     Status: Abnormal (Preliminary result)   Collection Time: 07/16/17  8:07 AM  Result Value Ref Range Status   Specimen Description BLOOD RIGHT ANTECUBITAL  Final   Special Requests   Final    BOTTLES DRAWN AEROBIC AND ANAEROBIC Blood Culture adequate volume   Culture  Setup Time   Final    GRAM NEGATIVE RODS IN BOTH AEROBIC AND ANAEROBIC BOTTLES CRITICAL RESULT CALLED TO, READ BACK BY AND VERIFIED WITH: Lavell Luster PHARMD [id number] 07/16/17 A BROWNING    Culture (A)  Final    ESCHERICHIA COLI SUSCEPTIBILITIES TO FOLLOW Performed at [facility], [place] [address] [place], [place] [address]    Report Status PENDING  Incomplete  Blood Culture ID Panel (Reflexed)     Status: Abnormal   Collection Time: 07/16/17  8:07 AM  Result Value Ref Range Status   Enterococcus species NOT DETECTED NOT DETECTED Final   Listeria monocytogenes NOT DETECTED NOT DETECTED Final   Staphylococcus species NOT DETECTED NOT DETECTED Final   Staphylococcus aureus NOT DETECTED NOT DETECTED Final   Streptococcus species NOT DETECTED NOT DETECTED Final   Streptococcus agalactiae NOT  DETECTED NOT DETECTED Final   Streptococcus pneumoniae NOT DETECTED NOT DETECTED Final   Streptococcus pyogenes NOT DETECTED NOT DETECTED Final   Acinetobacter baumannii NOT DETECTED NOT DETECTED Final   Enterobacteriaceae species DETECTED (A) NOT DETECTED Final    Comment: Enterobacteriaceae represent a large family of gram-negative bacteria, not a single organism. CRITICAL RESULT CALLED TO, READ BACK BY AND VERIFIED WITH: Lavell Luster PHARMD [id number] 07/16/17 A BROWNING    Enterobacter cloacae complex NOT DETECTED NOT DETECTED Final   Escherichia coli DETECTED (A) NOT DETECTED Final    Comment: CRITICAL RESULT CALLED TO, READ BACK BY AND VERIFIED WITH: Lavell Luster PHARMD [id number] 07/16/17 A BROWNING    Klebsiella oxytoca NOT DETECTED NOT DETECTED Final   Klebsiella pneumoniae NOT DETECTED NOT DETECTED Final   Proteus species NOT DETECTED NOT DETECTED Final   Serratia marcescens NOT DETECTED NOT DETECTED Final   Carbapenem resistance NOT DETECTED NOT DETECTED Final   Haemophilus influenzae NOT DETECTED NOT DETECTED Final   Neisseria meningitidis NOT DETECTED NOT DETECTED Final   Pseudomonas aeruginosa NOT DETECTED NOT DETECTED Final   Candida albicans NOT DETECTED NOT DETECTED Final   Candida glabrata NOT DETECTED NOT DETECTED Final   Candida krusei NOT DETECTED NOT DETECTED Final   Candida parapsilosis NOT DETECTED NOT DETECTED Final   Candida tropicalis NOT DETECTED NOT DETECTED Final    Comment: Performed at [facility], [place] [address] [place], [place] [address]     Radiology Studies: No results found.  Scheduled Meds: . chlorhexidine  15 mL Mouth Rinse BID  . clidinium-chlordiazePOXIDE  1 capsule Oral BID  . enoxaparin (LOVENOX) injection  40 mg Subcutaneous Q24H  . insulin aspart  0-9 Units Subcutaneous TID WC  . loratadine  10 mg Oral Daily  . mouth rinse  15 mL Mouth Rinse q12n4p  . pantoprazole  40 mg Oral Daily  . simvastatin  40 mg Oral QPM  . sucralfate  1 g Oral TID  WC & HS  . venlafaxine XR  150 mg Oral QHS   Continuous Infusions: . 0.9 % NaCl with KCl 40 mEq / L 100 mL/hr (  07/17/17 0458)  . cefTRIAXone (ROCEPHIN)  IV Stopped (07/17/17 0952)     LOS: 1 day   CHIU, Orpah Melter, MD [facility] Pager [phone]  If 7PM-7AM, please contact night-coverage [email or web address] Password [email or web address] 07/17/2017, 4:08 PM

## 2017-07-18 DIAGNOSIS — N390 Urinary tract infection, site not specified: Secondary | ICD-10-CM

## 2017-07-18 DIAGNOSIS — E784 Other hyperlipidemia: Secondary | ICD-10-CM

## 2017-07-18 DIAGNOSIS — N12 Tubulo-interstitial nephritis, not specified as acute or chronic: Secondary | ICD-10-CM

## 2017-07-18 LAB — URINE CULTURE: Culture: >=100000 — AB

## 2017-07-18 LAB — BASIC METABOLIC PANEL
Anion gap: 6 (ref 5–15)
BUN: 7 mg/dL (ref 6–20)
CALCIUM: 8.7 mg/dL — AB (ref 8.9–10.3)
CO2: 24 mmol/L (ref 22–32)
CREATININE: 0.85 mg/dL (ref 0.44–1.00)
Chloride: 106 mmol/L (ref 101–111)
Glucose, Bld: 102 mg/dL — ABNORMAL HIGH (ref 65–99)
Potassium: 4.6 mmol/L (ref 3.5–5.1)
SODIUM: 136 mmol/L (ref 135–145)

## 2017-07-18 LAB — GLUCOSE, CAPILLARY
GLUCOSE-CAPILLARY: 114 mg/dL — AB (ref 65–99)
GLUCOSE-CAPILLARY: 88 mg/dL (ref 65–99)
Glucose-Capillary: 89 mg/dL (ref 65–99)
Glucose-Capillary: 117 mg/dL — ABNORMAL HIGH (ref 65–99)

## 2017-07-18 LAB — CULTURE, BLOOD (ROUTINE X 2): SPECIAL REQUESTS: ADEQUATE

## 2017-07-18 NOTE — Care Management Note (Signed)
Case Management Note  Patient Details  Name: SHERLON NIED MRN: [id number] Date of Birth: [birth year]  Subjective/Objective: 66 y/o f admitted w/Acute pyelonephritis. From home.                   Action/Plan:d/c plan home.   Expected Discharge Date:   (unknown)               Expected Discharge Plan:  Home/Self Care  In-House Referral:     Discharge planning Services  CM Consult  Post Acute Care Choice:    Choice offered to:     DME Arranged:    DME Agency:     HH Arranged:    HH Agency:     Status of Service:  In process, will continue to follow  If discussed at Long Length of Stay Meetings, dates discussed:    Additional Comments:  Dessa Phi, RN 07/18/2017, 3:00 PM

## 2017-07-18 NOTE — Progress Notes (Signed)
PROGRESS NOTE    Shelby Vazquez  ERD:[id number] DOB: [birth year] DOA: 07/16/2017 PCP: Ria Bush, MD    Brief Narrative:   66 year old female with a history of gastroesophageal reflux, hypertension, prediabetes, recurrent urinary tract infection, urinary retention followed by urologist [name] at [facility], status post InterStim placement   For urinary retention , with improvement in her PVR volumes per urology notes from [facility] , who presents with multiple complaints including nausea with progressive weakness 5 days, increased urinary frequency, fever at home up to 101.5. Patient self catheterizes herself due to chronic urinary retention. Her fever has been not been controlled with ibuprofen. In the emergency department, patient was noted to have findings suggestive of UTI. Patient was admitted for suspected pyelonephritis.  Assessment & Plan:   Principal Problem:   Acute pyelonephritis Active Problems:   HLD (hyperlipidemia)   GAD (generalized anxiety disorder)   Essential hypertension   Uterine prolapse   MDD (major depressive disorder), recurrent episode, moderate (HCC)   Lower abdominal pain   Recurrent UTI   Diet-controlled diabetes mellitus (HCC)  Principal Problem:   Acute Escherichia coli pyelonephritis In the setting of chronic urinary retention -Patient has been continued on IV Rocephin. -Blood and urine cultures are positive for multidrug sensitive Escherichia coli. - patient continues to be febrile febrile overnight, thus will continue IV antibiotics untill afebrile for over 24 hours.  Hypertension -Continuing to hold Dyazide due to electrolyte abnormalities and dehydration -blood pressure stable at this time     HLD (hyperlipidemia) -continue statin as tolerated -presently stable    GAD (generalized anxiety disorder) -Patient is continued on Effexor -currently stable     Uterine prolapse/chronic urinary retention -Patient has been  followed by urology at [facility] -remains stable    MDD (major depressive disorder), recurrent episode, moderate ([place]) -Patient is continued on Effexor per above -stable presently   Gastroesophageal reflux disease -continue PPI and avoid NSAIDs as tolerated  Vitamin B-12 deficiency  -patient receives vitamin B-12 injections every 30 days at home     Diet-controlled diabetes mellitus (HCC) -Hemoglobin A1c of 6.1 improved from previous hemoglobin A1c of 6.5 -continuous lab scale insulin as tolerated  DVT prophylaxis: Lovenox subQ Code Status: Full Family Communication: Pt in room, family not at bedside Disposition Plan: Uncertain at this time  Consultants:     Procedures:     Antimicrobials: Anti-infectives    Start     Dose/Rate Route Frequency Ordered Stop   07/17/17 0800  cefTRIAXone (ROCEPHIN) 2 g in dextrose 5 % 50 mL IVPB     2 g 100 mL/hr over 30 Minutes Intravenous Every 24 hours 07/16/17 1029     07/16/17 0730  cefTRIAXone (ROCEPHIN) 1 g in dextrose 5 % 50 mL IVPB  Status:  Discontinued     1 g 100 mL/hr over 30 Minutes Intravenous Every 24 hours 07/16/17 0726 07/16/17 1029      Subjective: Reports feeling somewhat better today.  Objective: Vitals:   07/18/17 0240 07/18/17 0643 07/18/17 0836 07/18/17 1220  BP:  (!) 143/92 132/78 132/79  Pulse:  98 88 83  Resp:  18 20   Temp: 99.8 F (37.7 C) (!) 100.7 F (38.2 C) 99.7 F (37.6 C) 98.7 F (37.1 C)  TempSrc: Oral Oral Oral Oral  SpO2:  93% 93% 96%  Weight:      Height:        Intake/Output Summary (Last 24 hours) at 07/18/17 1432 Last data filed at 07/18/17  1000  Gross per 24 hour  Intake             1700 ml  Output             1470 ml  Net              230 ml   Filed Weights   07/16/17 0533 07/16/17 1528  Weight: 68 kg (150 lb) 70.5 kg (155 lb 6.8 oz)    Examination: General exam: Awake, laying in bed, in nad Respiratory system: Normal respiratory effort, no wheezing Cardiovascular  system: regular rate, s1, s2 Gastrointestinal system: Soft, nondistended, positive BS Central nervous system: CN2-12 grossly intact, strength intact Extremities: Perfused, no clubbing Skin: Normal skin turgor, no notable skin lesions seen Psychiatry: Mood normal // no visual hallucinations   Data Reviewed: I have personally reviewed following labs and imaging studies  CBC:  Recent Labs Lab 07/16/17 0700 07/17/17 0546  WBC 11.3* 11.8*  HGB 11.8* 10.1*  HCT 34.8* 29.7*  MCV 87.2 89.7  PLT 255 716   Basic Metabolic Panel:  Recent Labs Lab 07/16/17 0700 07/16/17 1348 07/17/17 0546 07/18/17 0458  NA 133*  --  135 136  K 3.1*  --  4.4 4.6  CL 97*  --  105 106  CO2 25  --  24 24  GLUCOSE 137*  --  128* 102*  BUN 17  --  15 7  CREATININE 0.90  --  1.13* 0.85  CALCIUM 9.0  --  8.4* 8.7*  MG  --  1.9  --   --    GFR: Estimated Creatinine Clearance: 62.7 mL/min (by C-G formula based on SCr of 0.85 mg/dL). Liver Function Tests:  Recent Labs Lab 07/16/17 0700 07/17/17 0546  AST 32 26  ALT 24 23  ALKPHOS 47 53  BILITOT 1.1 0.7  PROT 6.8 5.7*  ALBUMIN 3.8 3.0*   No results for input(s): LIPASE, AMYLASE in the last 168 hours. No results for input(s): AMMONIA in the last 168 hours. Coagulation Profile: No results for input(s): INR, PROTIME in the last 168 hours. Cardiac Enzymes: No results for input(s): CKTOTAL, CKMB, CKMBINDEX, TROPONINI in the last 168 hours. BNP (last 3 results) No results for input(s): PROBNP in the last 8760 hours. HbA1C:  Recent Labs  07/16/17 1355  HGBA1C 6.1*   CBG:  Recent Labs Lab 07/17/17 1227 07/17/17 1823 07/17/17 2131 07/18/17 0729 07/18/17 1149  GLUCAP 142* 116* 105* 114* 88   Lipid Profile: No results for input(s): CHOL, HDL, LDLCALC, TRIG, CHOLHDL, LDLDIRECT in the last 72 hours. Thyroid Function Tests:  Recent Labs  07/17/17 0546  TSH 5.898*   Anemia Panel: No results for input(s): VITAMINB12, FOLATE,  FERRITIN, TIBC, IRON, RETICCTPCT in the last 72 hours. Sepsis Labs:  Recent Labs Lab 07/16/17 0710  LATICACIDVEN 1.01    Recent Results (from the past 240 hour(s))  Urine C&S     Status: Abnormal   Collection Time: 07/16/17  6:30 AM  Result Value Ref Range Status   Specimen Description URINE, CLEAN CATCH  Final   Special Requests NONE  Final   Culture >=100,000 COLONIES/mL ESCHERICHIA COLI (A)  Final   Report Status 07/18/2017 FINAL  Final   Organism ID, Bacteria ESCHERICHIA COLI (A)  Final      Susceptibility   Escherichia coli - MIC*    AMPICILLIN >=32 RESISTANT Resistant     CEFAZOLIN <=4 SENSITIVE Sensitive     CEFTRIAXONE <=1 SENSITIVE Sensitive  CIPROFLOXACIN 1 SENSITIVE Sensitive     GENTAMICIN <=1 SENSITIVE Sensitive     IMIPENEM <=0.25 SENSITIVE Sensitive     NITROFURANTOIN <=16 SENSITIVE Sensitive     TRIMETH/SULFA >=320 RESISTANT Resistant     AMPICILLIN/SULBACTAM 16 INTERMEDIATE Intermediate     PIP/TAZO <=4 SENSITIVE Sensitive     Extended ESBL NEGATIVE Sensitive     * >=100,000 COLONIES/mL ESCHERICHIA COLI  Blood culture (routine x 2)     Status: None (Preliminary result)   Collection Time: 07/16/17  7:00 AM  Result Value Ref Range Status   Specimen Description BLOOD LEFT HAND  Final   Special Requests   Final    BOTTLES DRAWN AEROBIC AND ANAEROBIC Blood Culture adequate volume   Culture   Final    NO GROWTH 2 DAYS Performed at [facility], [place] [address], [place], [place] [address]    Report Status PENDING  Incomplete  Blood culture (routine x 2)     Status: Abnormal   Collection Time: 07/16/17  8:07 AM  Result Value Ref Range Status   Specimen Description BLOOD RIGHT ANTECUBITAL  Final   Special Requests   Final    BOTTLES DRAWN AEROBIC AND ANAEROBIC Blood Culture adequate volume   Culture  Setup Time   Final    GRAM NEGATIVE RODS IN BOTH AEROBIC AND ANAEROBIC BOTTLES CRITICAL RESULT CALLED TO, READ BACK BY AND VERIFIED WITHLavell Luster  [facility] 4742 07/16/17 A BROWNING Performed at [facility], [place] [address] [place], [place] [address]    Culture ESCHERICHIA COLI (A)  Final   Report Status 07/18/2017 FINAL  Final   Organism ID, Bacteria ESCHERICHIA COLI  Final      Susceptibility   Escherichia coli - MIC*    AMPICILLIN >=32 RESISTANT Resistant     CEFAZOLIN <=4 SENSITIVE Sensitive     CEFEPIME <=1 SENSITIVE Sensitive     CEFTAZIDIME <=1 SENSITIVE Sensitive     CEFTRIAXONE <=1 SENSITIVE Sensitive     CIPROFLOXACIN 0.5 SENSITIVE Sensitive     GENTAMICIN <=1 SENSITIVE Sensitive     IMIPENEM <=0.25 SENSITIVE Sensitive     TRIMETH/SULFA >=320 RESISTANT Resistant     AMPICILLIN/SULBACTAM >=32 RESISTANT Resistant     PIP/TAZO <=4 SENSITIVE Sensitive     Extended ESBL NEGATIVE Sensitive     * ESCHERICHIA COLI  Blood Culture ID Panel (Reflexed)     Status: Abnormal   Collection Time: 07/16/17  8:07 AM  Result Value Ref Range Status   Enterococcus species NOT DETECTED NOT DETECTED Final   Listeria monocytogenes NOT DETECTED NOT DETECTED Final   Staphylococcus species NOT DETECTED NOT DETECTED Final   Staphylococcus aureus NOT DETECTED NOT DETECTED Final   Streptococcus species NOT DETECTED NOT DETECTED Final   Streptococcus agalactiae NOT DETECTED NOT DETECTED Final   Streptococcus pneumoniae NOT DETECTED NOT DETECTED Final   Streptococcus pyogenes NOT DETECTED NOT DETECTED Final   Acinetobacter baumannii NOT DETECTED NOT DETECTED Final   Enterobacteriaceae species DETECTED (A) NOT DETECTED Final    Comment: Enterobacteriaceae represent a large family of gram-negative bacteria, not a single organism. CRITICAL RESULT CALLED TO, READ BACK BY AND VERIFIED WITH: Lavell Luster PHARMD [id number] 07/16/17 A BROWNING    Enterobacter cloacae complex NOT DETECTED NOT DETECTED Final   Escherichia coli DETECTED (A) NOT DETECTED Final    Comment: CRITICAL RESULT CALLED TO, READ BACK BY AND VERIFIED WITHLavell Luster PHARMD [id number] 07/16/17  A BROWNING    Klebsiella  oxytoca NOT DETECTED NOT DETECTED Final   Klebsiella pneumoniae NOT DETECTED NOT DETECTED Final   Proteus species NOT DETECTED NOT DETECTED Final   Serratia marcescens NOT DETECTED NOT DETECTED Final   Carbapenem resistance NOT DETECTED NOT DETECTED Final   Haemophilus influenzae NOT DETECTED NOT DETECTED Final   Neisseria meningitidis NOT DETECTED NOT DETECTED Final   Pseudomonas aeruginosa NOT DETECTED NOT DETECTED Final   Candida albicans NOT DETECTED NOT DETECTED Final   Candida glabrata NOT DETECTED NOT DETECTED Final   Candida krusei NOT DETECTED NOT DETECTED Final   Candida parapsilosis NOT DETECTED NOT DETECTED Final   Candida tropicalis NOT DETECTED NOT DETECTED Final    Comment: Performed at [facility], [place] [address], [place], [place] [address]     Radiology Studies: No results found.  Scheduled Meds: . chlorhexidine  15 mL Mouth Rinse BID  . clidinium-chlordiazePOXIDE  1 capsule Oral BID  . enoxaparin (LOVENOX) injection  40 mg Subcutaneous Q24H  . insulin aspart  0-9 Units Subcutaneous TID WC  . loratadine  10 mg Oral Daily  . mouth rinse  15 mL Mouth Rinse q12n4p  . pantoprazole  40 mg Oral Daily  . simvastatin  40 mg Oral QPM  . sucralfate  1 g Oral TID WC & HS  . venlafaxine XR  150 mg Oral QHS   Continuous Infusions: . 0.9 % NaCl with KCl 40 mEq / L 100 mL/hr (07/18/17 1326)  . cefTRIAXone (ROCEPHIN)  IV Stopped (07/18/17 0940)     LOS: 2 days   Jayelle Page, Orpah Melter, MD [facility] Pager [phone]  If 7PM-7AM, please contact night-coverage [email or web address] Password [facility] 07/18/2017, 2:32 PM

## 2017-07-19 DIAGNOSIS — N814 Uterovaginal prolapse, unspecified: Secondary | ICD-10-CM

## 2017-07-19 DIAGNOSIS — F331 Major depressive disorder, recurrent, moderate: Secondary | ICD-10-CM

## 2017-07-19 LAB — GLUCOSE, CAPILLARY
GLUCOSE-CAPILLARY: 109 mg/dL — AB (ref 65–99)
GLUCOSE-CAPILLARY: 108 mg/dL — AB (ref 65–99)
Glucose-Capillary: 119 mg/dL — ABNORMAL HIGH (ref 65–99)

## 2017-07-19 MED ORDER — KETOROLAC TROMETHAMINE 15 MG/ML IJ SOLN
15.0000 mg | Freq: Four times a day (QID) | INTRAMUSCULAR | Status: DC | PRN
  Administered 2017-07-19 – 2017-07-20 (×2): 15 mg via INTRAVENOUS
  Filled 2017-07-19 (×3): qty 1

## 2017-07-19 NOTE — Progress Notes (Signed)
Patient voided 225 mL of urine. Patient self cathed 50 mL of urine after voiding. PVR per bladder scanner <54mL.  Shelby Vazquez. Brigitte Pulse, RN

## 2017-07-19 NOTE — Progress Notes (Signed)
PROGRESS NOTE    Shelby Vazquez  OEH:[id number] DOB: [birth year] DOA: 07/16/2017 PCP: Ria Bush, MD    Brief Narrative:   66 year old female with a history of gastroesophageal reflux, hypertension, prediabetes, recurrent urinary tract infection, urinary retention followed by urologist [name] at [facility], status post InterStim placement   For urinary retention , with improvement in her PVR volumes per urology notes from [facility] , who presents with multiple complaints including nausea with progressive weakness 5 days, increased urinary frequency, fever at home up to 101.5. Patient self catheterizes herself due to chronic urinary retention. Her fever has been not been controlled with ibuprofen. In the emergency department, patient was noted to have findings suggestive of UTI. Patient was admitted for suspected pyelonephritis.  Assessment & Plan:   Principal Problem:   Acute pyelonephritis Active Problems:   HLD (hyperlipidemia)   GAD (generalized anxiety disorder)   Essential hypertension   Uterine prolapse   MDD (major depressive disorder), recurrent episode, moderate (HCC)   Lower abdominal pain   Recurrent UTI   Diet-controlled diabetes mellitus (HCC)  Principal Problem:   Acute Escherichia coli pyelonephritis In the setting of chronic urinary retention -Patient has been continued on IV Rocephin. -Blood and urine cultures are positive for multidrug sensitive Escherichia coli. - Overall improved, however, patient remains febrile early this AM. Will therefore continue IV antibiotics  Hypertension -Continuing to hold Dyazide due to electrolyte abnormalities and dehydration -blood pressure stable at this time -Cont to monitor     HLD (hyperlipidemia) -continue statin as tolerated -remains stable    GAD (generalized anxiety disorder) -Patient is continued on Effexor -presently stable     Uterine prolapse/chronic urinary retention -Patient has been  followed by urology at [facility] -currently stable    MDD (major depressive disorder), recurrent episode, moderate ([place]) -Patient is continued on Effexor per above -presently stable   Gastroesophageal reflux disease -continue PPI and avoid NSAIDs as tolerated -stable at present  Vitamin B-12 deficiency  -patient receives vitamin B-12 injections every 30 days at home     Diet-controlled diabetes mellitus (HCC) -Hemoglobin A1c of 6.1 improved from previous hemoglobin A1c of 6.5 -continue sliding insulin as tolerated  DVT prophylaxis: Lovenox subQ Code Status: Full Family Communication: Pt in room, family at bedside Disposition Plan: Uncertain at this time  Consultants:     Procedures:     Antimicrobials: Anti-infectives    Start     Dose/Rate Route Frequency Ordered Stop   07/17/17 0800  cefTRIAXone (ROCEPHIN) 2 g in dextrose 5 % 50 mL IVPB     2 g 100 mL/hr over 30 Minutes Intravenous Every 24 hours 07/16/17 1029     07/16/17 0730  cefTRIAXone (ROCEPHIN) 1 g in dextrose 5 % 50 mL IVPB  Status:  Discontinued     1 g 100 mL/hr over 30 Minutes Intravenous Every 24 hours 07/16/17 0726 07/16/17 1029      Subjective: Ambulating well. Reports headache this AM  Objective: Vitals:   07/18/17 2141 07/19/17 0026 07/19/17 0604 07/19/17 1400  BP: (!) 141/82  131/83 127/75  Pulse: 91  87 78  Resp: 20  18 18   Temp: (!) 100.4 F (38 C) 99.3 F (37.4 C) 99.4 F (37.4 C) 98.4 F (36.9 C)  TempSrc: Oral Oral Oral Oral  SpO2: 95%  91% 97%  Weight:      Height:        Intake/Output Summary (Last 24 hours) at 07/19/17 1647 Last data filed at  07/19/17 1513  Gross per 24 hour  Intake          3511.67 ml  Output              763 ml  Net          2748.67 ml   Filed Weights   07/16/17 0533 07/16/17 1528  Weight: 68 kg (150 lb) 70.5 kg (155 lb 6.8 oz)    Examination: General exam: Conversant, in no acute distress Respiratory system: normal chest rise, clear, no  audible wheezing Cardiovascular system: regular rhythm, s1-s2 Gastrointestinal system: Nondistended, nontender, pos BS Central nervous system: No seizures, no tremors Extremities: No cyanosis, no joint deformities Skin: No rashes, no pallor Psychiatry: Affect normal // no auditory hallucinations   Data Reviewed: I have personally reviewed following labs and imaging studies  CBC:  Recent Labs Lab 07/16/17 0700 07/17/17 0546  WBC 11.3* 11.8*  HGB 11.8* 10.1*  HCT 34.8* 29.7*  MCV 87.2 89.7  PLT 255 297   Basic Metabolic Panel:  Recent Labs Lab 07/16/17 0700 07/16/17 1348 07/17/17 0546 07/18/17 0458  NA 133*  --  135 136  K 3.1*  --  4.4 4.6  CL 97*  --  105 106  CO2 25  --  24 24  GLUCOSE 137*  --  128* 102*  BUN 17  --  15 7  CREATININE 0.90  --  1.13* 0.85  CALCIUM 9.0  --  8.4* 8.7*  MG  --  1.9  --   --    GFR: Estimated Creatinine Clearance: 62.7 mL/min (by C-G formula based on SCr of 0.85 mg/dL). Liver Function Tests:  Recent Labs Lab 07/16/17 0700 07/17/17 0546  AST 32 26  ALT 24 23  ALKPHOS 47 53  BILITOT 1.1 0.7  PROT 6.8 5.7*  ALBUMIN 3.8 3.0*   No results for input(s): LIPASE, AMYLASE in the last 168 hours. No results for input(s): AMMONIA in the last 168 hours. Coagulation Profile: No results for input(s): INR, PROTIME in the last 168 hours. Cardiac Enzymes: No results for input(s): CKTOTAL, CKMB, CKMBINDEX, TROPONINI in the last 168 hours. BNP (last 3 results) No results for input(s): PROBNP in the last 8760 hours. HbA1C: No results for input(s): HGBA1C in the last 72 hours. CBG:  Recent Labs Lab 07/18/17 0729 07/18/17 1149 07/18/17 1807 07/18/17 2140 07/19/17 0746  GLUCAP 114* 88 89 117* 109*   Lipid Profile: No results for input(s): CHOL, HDL, LDLCALC, TRIG, CHOLHDL, LDLDIRECT in the last 72 hours. Thyroid Function Tests:  Recent Labs  07/17/17 0546  TSH 5.898*   Anemia Panel: No results for input(s): VITAMINB12,  FOLATE, FERRITIN, TIBC, IRON, RETICCTPCT in the last 72 hours. Sepsis Labs:  Recent Labs Lab 07/16/17 0710  LATICACIDVEN 1.01    Recent Results (from the past 240 hour(s))  Urine C&S     Status: Abnormal   Collection Time: 07/16/17  6:30 AM  Result Value Ref Range Status   Specimen Description URINE, CLEAN CATCH  Final   Special Requests NONE  Final   Culture >=100,000 COLONIES/mL ESCHERICHIA COLI (A)  Final   Report Status 07/18/2017 FINAL  Final   Organism ID, Bacteria ESCHERICHIA COLI (A)  Final      Susceptibility   Escherichia coli - MIC*    AMPICILLIN >=32 RESISTANT Resistant     CEFAZOLIN <=4 SENSITIVE Sensitive     CEFTRIAXONE <=1 SENSITIVE Sensitive     CIPROFLOXACIN 1 SENSITIVE Sensitive  GENTAMICIN <=1 SENSITIVE Sensitive     IMIPENEM <=0.25 SENSITIVE Sensitive     NITROFURANTOIN <=16 SENSITIVE Sensitive     TRIMETH/SULFA >=320 RESISTANT Resistant     AMPICILLIN/SULBACTAM 16 INTERMEDIATE Intermediate     PIP/TAZO <=4 SENSITIVE Sensitive     Extended ESBL NEGATIVE Sensitive     * >=100,000 COLONIES/mL ESCHERICHIA COLI  Blood culture (routine x 2)     Status: None (Preliminary result)   Collection Time: 07/16/17  7:00 AM  Result Value Ref Range Status   Specimen Description BLOOD LEFT HAND  Final   Special Requests   Final    BOTTLES DRAWN AEROBIC AND ANAEROBIC Blood Culture adequate volume   Culture   Final    NO GROWTH 3 DAYS Performed at [facility], [place] [address], [place], [place] [address]    Report Status PENDING  Incomplete  Blood culture (routine x 2)     Status: Abnormal   Collection Time: 07/16/17  8:07 AM  Result Value Ref Range Status   Specimen Description BLOOD RIGHT ANTECUBITAL  Final   Special Requests   Final    BOTTLES DRAWN AEROBIC AND ANAEROBIC Blood Culture adequate volume   Culture  Setup Time   Final    GRAM NEGATIVE RODS IN BOTH AEROBIC AND ANAEROBIC BOTTLES CRITICAL RESULT CALLED TO, READ BACK BY AND VERIFIED WITHLavell Luster [facility] [id number] 07/16/17 A BROWNING Performed at [facility], [place] [address], [place], [place] [address]    Culture ESCHERICHIA COLI (A)  Final   Report Status 07/18/2017 FINAL  Final   Organism ID, Bacteria ESCHERICHIA COLI  Final      Susceptibility   Escherichia coli - MIC*    AMPICILLIN >=32 RESISTANT Resistant     CEFAZOLIN <=4 SENSITIVE Sensitive     CEFEPIME <=1 SENSITIVE Sensitive     CEFTAZIDIME <=1 SENSITIVE Sensitive     CEFTRIAXONE <=1 SENSITIVE Sensitive     CIPROFLOXACIN 0.5 SENSITIVE Sensitive     GENTAMICIN <=1 SENSITIVE Sensitive     IMIPENEM <=0.25 SENSITIVE Sensitive     TRIMETH/SULFA >=320 RESISTANT Resistant     AMPICILLIN/SULBACTAM >=32 RESISTANT Resistant     PIP/TAZO <=4 SENSITIVE Sensitive     Extended ESBL NEGATIVE Sensitive     * ESCHERICHIA COLI  Blood Culture ID Panel (Reflexed)     Status: Abnormal   Collection Time: 07/16/17  8:07 AM  Result Value Ref Range Status   Enterococcus species NOT DETECTED NOT DETECTED Final   Listeria monocytogenes NOT DETECTED NOT DETECTED Final   Staphylococcus species NOT DETECTED NOT DETECTED Final   Staphylococcus aureus NOT DETECTED NOT DETECTED Final   Streptococcus species NOT DETECTED NOT DETECTED Final   Streptococcus agalactiae NOT DETECTED NOT DETECTED Final   Streptococcus pneumoniae NOT DETECTED NOT DETECTED Final   Streptococcus pyogenes NOT DETECTED NOT DETECTED Final   Acinetobacter baumannii NOT DETECTED NOT DETECTED Final   Enterobacteriaceae species DETECTED (A) NOT DETECTED Final    Comment: Enterobacteriaceae represent a large family of gram-negative bacteria, not a single organism. CRITICAL RESULT CALLED TO, READ BACK BY AND VERIFIED WITH: Lavell Luster PHARMD [id number] 07/16/17 A BROWNING    Enterobacter cloacae complex NOT DETECTED NOT DETECTED Final   Escherichia coli DETECTED (A) NOT DETECTED Final    Comment: CRITICAL RESULT CALLED TO, READ BACK BY AND VERIFIED WITHLavell Luster PHARMD  [id number] 07/16/17 A BROWNING    Klebsiella oxytoca NOT DETECTED NOT DETECTED Final  Klebsiella pneumoniae NOT DETECTED NOT DETECTED Final   Proteus species NOT DETECTED NOT DETECTED Final   Serratia marcescens NOT DETECTED NOT DETECTED Final   Carbapenem resistance NOT DETECTED NOT DETECTED Final   Haemophilus influenzae NOT DETECTED NOT DETECTED Final   Neisseria meningitidis NOT DETECTED NOT DETECTED Final   Pseudomonas aeruginosa NOT DETECTED NOT DETECTED Final   Candida albicans NOT DETECTED NOT DETECTED Final   Candida glabrata NOT DETECTED NOT DETECTED Final   Candida krusei NOT DETECTED NOT DETECTED Final   Candida parapsilosis NOT DETECTED NOT DETECTED Final   Candida tropicalis NOT DETECTED NOT DETECTED Final    Comment: Performed at [facility], [place] [address] [place], [place] [address]     Radiology Studies: No results found.  Scheduled Meds: . chlorhexidine  15 mL Mouth Rinse BID  . clidinium-chlordiazePOXIDE  1 capsule Oral BID  . enoxaparin (LOVENOX) injection  40 mg Subcutaneous Q24H  . insulin aspart  0-9 Units Subcutaneous TID WC  . loratadine  10 mg Oral Daily  . mouth rinse  15 mL Mouth Rinse q12n4p  . pantoprazole  40 mg Oral Daily  . simvastatin  40 mg Oral QPM  . sucralfate  1 g Oral TID WC & HS  . venlafaxine XR  150 mg Oral QHS   Continuous Infusions: . 0.9 % NaCl with KCl 40 mEq / L 100 mL/hr (07/19/17 0922)  . cefTRIAXone (ROCEPHIN)  IV Stopped (07/19/17 0952)     LOS: 3 days   CHIU, Orpah Melter, MD [facility] Pager [phone]  If 7PM-7AM, please contact night-coverage [email or web address] Password [facility] 07/19/2017, 4:47 PM

## 2017-07-19 NOTE — Progress Notes (Signed)
Pt urinated 350 mL, self catheterized 50 mL and pvr showed 59mL.

## 2017-07-20 LAB — GLUCOSE, CAPILLARY
Glucose-Capillary: 116 mg/dL — ABNORMAL HIGH (ref 65–99)
Glucose-Capillary: 136 mg/dL — ABNORMAL HIGH (ref 65–99)

## 2017-07-20 LAB — CBC
HCT: 32.5 % — ABNORMAL LOW (ref 36.0–46.0)
Hemoglobin: 10.6 g/dL — ABNORMAL LOW (ref 12.0–15.0)
MCH: 29.1 pg (ref 26.0–34.0)
MCHC: 32.6 g/dL (ref 30.0–36.0)
MCV: 89.3 fL (ref 78.0–100.0)
PLATELETS: 307 10*3/uL (ref 150–400)
RBC: 3.64 MIL/uL — AB (ref 3.87–5.11)
RDW: 14 % (ref 11.5–15.5)
WBC: 5.5 10*3/uL (ref 4.0–10.5)

## 2017-07-20 MED ORDER — CEFPODOXIME PROXETIL 200 MG PO TABS: 200.0000 mg | ORAL_TABLET | Freq: Two times a day (BID) | ORAL | 0 refills | Status: DC

## 2017-07-20 MED ORDER — KETOROLAC TROMETHAMINE 10 MG PO TABS: 10.0000 mg | ORAL_TABLET | Freq: Four times a day (QID) | ORAL | 0 refills | Status: DC | PRN

## 2017-07-20 MED ORDER — KETOROLAC TROMETHAMINE 30 MG/ML IJ SOLN
30.0000 mg | Freq: Once | INTRAMUSCULAR | Status: AC
  Administered 2017-07-20: 30 mg via INTRAVENOUS
  Filled 2017-07-20: qty 1

## 2017-07-20 NOTE — Discharge Summary (Signed)
Physician Discharge Summary  Shelby Vazquez DPO:[id number] DOB: 1950-11-26 DOA: 07/16/2017  PCP: Ria Bush, MD  Admit date: 07/16/2017 Discharge date: 07/20/2017  Admitted From: Home Disposition:  Home  Recommendations for Outpatient Follow-up:  1. Follow up with PCP in 1-2 weeks 2. Please follow up on continued posterior headaches. Patient was offered imaging, however declined further inpatient work up.  Discharge Condition:Improved CODE STATUS:Full Diet recommendation: Regular   Brief/Interim Summary: 66 year old female with a history of gastroesophageal reflux, hypertension, prediabetes, recurrent urinary tract infection, urinary retention followed by urologist [name] at [facility], status post InterStim placement For urinary retention , with improvement in her PVR volumes per urology notes from [facility] , who presents with multiple complaints including nausea with progressive weakness 5 days, increased urinary frequency, fever at home up to 101.5.Patient self catheterizes herself due to chronic urinary retention. Her fever has been not been controlled with ibuprofen. In the emergency department, patient was noted to have findings suggestive of UTI. Patient was admitted for suspected pyelonephritis.  Principal Problem: Acute Escherichia coli pyelonephritis In the setting of chronic urinary retention -Patient was continued on IV Rocephin. -Blood and urine cultures are positive for multidrug sensitive Escherichia coli. - Overall improved with fevers later resolving -Patient to complete course of vantin on discharge  Hypertension -Continuing to hold Dyazide due to electrolyte abnormalities and dehydration -blood pressure stable at this time  HLD (hyperlipidemia) -continue statin as tolerated -remains stable  GAD (generalized anxiety disorder) -Patient is continued on Effexor -presently stable  Uterine prolapse/chronic urinary  retention -Patient has been followed by urology at [facility] -currently stable  MDD (major depressive disorder), recurrent episode, moderate ([place]) -Patient is continued on Effexor per above -presently stable  Gastroesophageal reflux disease -continue PPI and avoid NSAIDs as tolerated -stable at present  Vitamin B-12 deficiency  -patient receives vitamin B-12 injections every 30 days at home  Diet-controlled diabetes mellitus ([place]) -Hemoglobin A1c of 6.1 improved from previous hemoglobin A1c of 6.5 -was continued on sliding insulin as tolerated while inpatient  Posterior headaches -Improved with Toradol -Patient reports photophobia, phonophobia. No aura noted -Patient was recommended inpatient imaging, however patient declined further inpatient work up -will recommend close outpatient follow up and further workup of headache -Patient was given prescription of ketorolac on discharge -Patient is neurologically intact  Discharge Diagnoses:  Principal Problem:   Acute pyelonephritis Active Problems:   HLD (hyperlipidemia)   GAD (generalized anxiety disorder)   Essential hypertension   Uterine prolapse   MDD (major depressive disorder), recurrent episode, moderate ([place])   Lower abdominal pain   Recurrent UTI   Diet-controlled diabetes mellitus ([place])    Discharge Instructions   Allergies as of 07/20/2017      Reactions   Augmentin [amoxicillin-pot Clavulanate]    Severe diarrhea, rash, worsened acid reflux   Erythromycin Rash      Medication List    STOP taking these medications   cyanocobalamin 1000 MCG/ML injection Commonly known as:  (VITAMIN B-12)   ondansetron 4 MG disintegrating tablet Commonly known as:  ZOFRAN-ODT   promethazine 25 MG tablet Commonly known as:  PHENERGAN     TAKE these medications   acetaminophen 500 MG tablet Commonly known as:  TYLENOL Take 1,000 mg by mouth every 6 (six) hours as needed for moderate pain.   Calcium  Carbonate-Vitamin D 600-200 MG-UNIT Caps Take 1 capsule by mouth daily.   CARAFATE 1 GM/10ML suspension Generic drug:  sucralfate take 10 milliliters by mouth four  times a day if needed   cefpodoxime 200 MG tablet Commonly known as:  VANTIN Take 1 tablet (200 mg total) by mouth 2 (two) times daily.   clidinium-chlordiazePOXIDE 5-2.5 MG capsule Commonly known as:  LIBRAX TAKE 1 CAPSULE BY MOUTH TWO TIMES DAILY   EQ MULTIVITAMINS ADULT GUMMY PO Take 2 tablets by mouth daily.   famotidine 40 MG tablet Commonly known as:  PEPCID TAKE 2 TABLETS BY MOUTH AT  BEDTIME   ibuprofen 200 MG tablet Commonly known as:  ADVIL,MOTRIN Take 600 mg by mouth every 6 (six) hours as needed for moderate pain.   ketorolac 10 MG tablet Commonly known as:  TORADOL Take 1 tablet (10 mg total) by mouth every 6 (six) hours as needed.   loratadine 10 MG tablet Commonly known as:  CLARITIN Take 10 mg by mouth daily.   Melatonin 10 MG Tbdp Take 20 mg by mouth daily.   methenamine 1 g tablet Commonly known as:  HIPREX Take 1 g by mouth 2 (two) times daily with a meal.   PRESCRIPTION MEDICATION Apply topically 2 (two) times daily. Estradiol Progesterone cream 0.05-30 mg/ml   PROBIOTIC PO Take 2 capsules by mouth 2 (two) times daily.   RABEprazole 20 MG tablet Commonly known as:  ACIPHEX Take 20 mg by mouth daily.   ranitidine 150 MG tablet Commonly known as:  ZANTAC Take 150 mg by mouth 2 (two) times daily as needed for heartburn.   simvastatin 40 MG tablet Commonly known as:  ZOCOR TAKE 1 TABLET BY MOUTH  EVERY EVENING   triamterene-hydrochlorothiazide 37.5-25 MG capsule Commonly known as:  DYAZIDE TAKE 1 CAPSULE BY MOUTH  EVERY MORNING   venlafaxine XR 75 MG 24 hr capsule Commonly known as:  EFFEXOR-XR take 2 capsules by mouth once daily WITH BREAKFAST What changed:  See the new instructions.            Discharge Care Instructions        Start     Ordered   07/20/17 0000   cefpodoxime (VANTIN) 200 MG tablet  2 times daily    Comments:  Tolerating rocephin   07/20/17 1431   07/20/17 0000  ketorolac (TORADOL) 10 MG tablet  Every 6 hours PRN     07/20/17 1431     Follow-up Information    Ria Bush, MD. Schedule an appointment as soon as possible for a visit in 1 week(s).   Specialty:  Family Medicine Contact information: [place] [address] [phone]          Allergies  Allergen Reactions  . Augmentin [Amoxicillin-Pot Clavulanate]     Severe diarrhea, rash, worsened acid reflux  . Erythromycin Rash     Procedures/Studies: No results found.  Subjective: Eager to go home  Discharge Exam: Vitals:   07/19/17 2129 07/20/17 0514  BP: (!) 145/87 135/72  Pulse: 84 83  Resp: 18 16  Temp: 99.2 F (37.3 C) 99.3 F (37.4 C)  SpO2: 98% 95%   Vitals:   07/19/17 0604 07/19/17 1400 07/19/17 2129 07/20/17 0514  BP: 131/83 127/75 (!) 145/87 135/72  Pulse: 87 78 84 83  Resp: 18 18 18 16   Temp: 99.4 F (37.4 C) 98.4 F (36.9 C) 99.2 F (37.3 C) 99.3 F (37.4 C)  TempSrc: Oral Oral Oral Oral  SpO2: 91% 97% 98% 95%  Weight:      Height:        General: Pt is alert, awake, not  in acute distress Cardiovascular: RRR, S1/S2 +, no rubs, no gallops Respiratory: CTA bilaterally, no wheezing, no rhonchi Abdominal: Soft, NT, ND, bowel sounds + Extremities: no edema, no cyanosis   The results of significant diagnostics from this hospitalization (including imaging, microbiology, ancillary and laboratory) are listed below for reference.     Microbiology: Recent Results (from the past 240 hour(s))  Urine C&S     Status: Abnormal   Collection Time: 07/16/17  6:30 AM  Result Value Ref Range Status   Specimen Description URINE, CLEAN CATCH  Final   Special Requests NONE  Final   Culture >=100,000 COLONIES/mL ESCHERICHIA COLI (A)  Final   Report Status 07/18/2017 FINAL  Final   Organism ID, Bacteria ESCHERICHIA  COLI (A)  Final      Susceptibility   Escherichia coli - MIC*    AMPICILLIN >=32 RESISTANT Resistant     CEFAZOLIN <=4 SENSITIVE Sensitive     CEFTRIAXONE <=1 SENSITIVE Sensitive     CIPROFLOXACIN 1 SENSITIVE Sensitive     GENTAMICIN <=1 SENSITIVE Sensitive     IMIPENEM <=0.25 SENSITIVE Sensitive     NITROFURANTOIN <=16 SENSITIVE Sensitive     TRIMETH/SULFA >=320 RESISTANT Resistant     AMPICILLIN/SULBACTAM 16 INTERMEDIATE Intermediate     PIP/TAZO <=4 SENSITIVE Sensitive     Extended ESBL NEGATIVE Sensitive     * >=100,000 COLONIES/mL ESCHERICHIA COLI  Blood culture (routine x 2)     Status: None (Preliminary result)   Collection Time: 07/16/17  7:00 AM  Result Value Ref Range Status   Specimen Description BLOOD LEFT HAND  Final   Special Requests   Final    BOTTLES DRAWN AEROBIC AND ANAEROBIC Blood Culture adequate volume   Culture   Final    NO GROWTH 4 DAYS Performed at [facility], [address], [place], [place] [address]    Report Status PENDING  Incomplete  Blood culture (routine x 2)     Status: Abnormal   Collection Time: 07/16/17  8:07 AM  Result Value Ref Range Status   Specimen Description BLOOD RIGHT ANTECUBITAL  Final   Special Requests   Final    BOTTLES DRAWN AEROBIC AND ANAEROBIC Blood Culture adequate volume   Culture  Setup Time   Final    GRAM NEGATIVE RODS IN BOTH AEROBIC AND ANAEROBIC BOTTLES CRITICAL RESULT CALLED TO, READ BACK BY AND VERIFIED WITHLavell Luster [facility] 9211 07/16/17 A BROWNING Performed at [facility], [place] [address], [place], [place] [address]    Culture ESCHERICHIA COLI (A)  Final   Report Status 07/18/2017 FINAL  Final   Organism ID, Bacteria ESCHERICHIA COLI  Final      Susceptibility   Escherichia coli - MIC*    AMPICILLIN >=32 RESISTANT Resistant     CEFAZOLIN <=4 SENSITIVE Sensitive     CEFEPIME <=1 SENSITIVE Sensitive     CEFTAZIDIME <=1 SENSITIVE Sensitive     CEFTRIAXONE <=1 SENSITIVE Sensitive      CIPROFLOXACIN 0.5 SENSITIVE Sensitive     GENTAMICIN <=1 SENSITIVE Sensitive     IMIPENEM <=0.25 SENSITIVE Sensitive     TRIMETH/SULFA >=320 RESISTANT Resistant     AMPICILLIN/SULBACTAM >=32 RESISTANT Resistant     PIP/TAZO <=4 SENSITIVE Sensitive     Extended ESBL NEGATIVE Sensitive     * ESCHERICHIA COLI  Blood Culture ID Panel (Reflexed)     Status: Abnormal   Collection Time: 07/16/17  8:07 AM  Result Value Ref Range  Status   Enterococcus species NOT DETECTED NOT DETECTED Final   Listeria monocytogenes NOT DETECTED NOT DETECTED Final   Staphylococcus species NOT DETECTED NOT DETECTED Final   Staphylococcus aureus NOT DETECTED NOT DETECTED Final   Streptococcus species NOT DETECTED NOT DETECTED Final   Streptococcus agalactiae NOT DETECTED NOT DETECTED Final   Streptococcus pneumoniae NOT DETECTED NOT DETECTED Final   Streptococcus pyogenes NOT DETECTED NOT DETECTED Final   Acinetobacter baumannii NOT DETECTED NOT DETECTED Final   Enterobacteriaceae species DETECTED (A) NOT DETECTED Final    Comment: Enterobacteriaceae represent a large family of gram-negative bacteria, not a single organism. CRITICAL RESULT CALLED TO, READ BACK BY AND VERIFIED WITH: Lavell Luster PHARMD [id number] 07/16/17 A BROWNING    Enterobacter cloacae complex NOT DETECTED NOT DETECTED Final   Escherichia coli DETECTED (A) NOT DETECTED Final    Comment: CRITICAL RESULT CALLED TO, READ BACK BY AND VERIFIED WITH: Lavell Luster PHARMD [id number] 07/16/17 A BROWNING    Klebsiella oxytoca NOT DETECTED NOT DETECTED Final   Klebsiella pneumoniae NOT DETECTED NOT DETECTED Final   Proteus species NOT DETECTED NOT DETECTED Final   Serratia marcescens NOT DETECTED NOT DETECTED Final   Carbapenem resistance NOT DETECTED NOT DETECTED Final   Haemophilus influenzae NOT DETECTED NOT DETECTED Final   Neisseria meningitidis NOT DETECTED NOT DETECTED Final   Pseudomonas aeruginosa NOT DETECTED NOT DETECTED Final   Candida albicans NOT  DETECTED NOT DETECTED Final   Candida glabrata NOT DETECTED NOT DETECTED Final   Candida krusei NOT DETECTED NOT DETECTED Final   Candida parapsilosis NOT DETECTED NOT DETECTED Final   Candida tropicalis NOT DETECTED NOT DETECTED Final    Comment: Performed at [facility], [place] [address], [place],  [address]     Labs: BNP (last 3 results) No results for input(s): BNP in the last 8760 hours. Basic Metabolic Panel:  Recent Labs Lab 07/16/17 0700 07/16/17 1348 07/17/17 0546 07/18/17 0458  NA 133*  --  135 136  K 3.1*  --  4.4 4.6  CL 97*  --  105 106  CO2 25  --  24 24  GLUCOSE 137*  --  128* 102*  BUN 17  --  15 7  CREATININE 0.90  --  1.13* 0.85  CALCIUM 9.0  --  8.4* 8.7*  MG  --  1.9  --   --    Liver Function Tests:  Recent Labs Lab 07/16/17 0700 07/17/17 0546  AST 32 26  ALT 24 23  ALKPHOS 47 53  BILITOT 1.1 0.7  PROT 6.8 5.7*  ALBUMIN 3.8 3.0*   No results for input(s): LIPASE, AMYLASE in the last 168 hours. No results for input(s): AMMONIA in the last 168 hours. CBC:  Recent Labs Lab 07/16/17 0700 07/17/17 0546 07/20/17 0618  WBC 11.3* 11.8* 5.5  HGB 11.8* 10.1* 10.6*  HCT 34.8* 29.7* 32.5*  MCV 87.2 89.7 89.3  PLT 255 219 307   Cardiac Enzymes: No results for input(s): CKTOTAL, CKMB, CKMBINDEX, TROPONINI in the last 168 hours. BNP: Invalid input(s): POCBNP CBG:  Recent Labs Lab 07/19/17 0746 07/19/17 1647 07/19/17 2130 07/20/17 0720 07/20/17 1136  GLUCAP 109* 119* 108* 116* 136*   D-Dimer No results for input(s): DDIMER in the last 72 hours. Hgb A1c No results for input(s): HGBA1C in the last 72 hours. Lipid Profile No results for input(s): CHOL, HDL, LDLCALC, TRIG, CHOLHDL, LDLDIRECT in the last 72 hours. Thyroid function studies No results for input(s): TSH,  T4TOTAL, T3FREE, THYROIDAB in the last 72 hours.  Invalid input(s): FREET3 Anemia work up No results for input(s): VITAMINB12, FOLATE, FERRITIN, TIBC, IRON,  RETICCTPCT in the last 72 hours. Urinalysis    Component Value Date/Time   COLORURINE YELLOW 07/16/2017 0630   APPEARANCEUR CLOUDY (A) 07/16/2017 0630   LABSPEC 1.008 07/16/2017 0630   PHURINE 5.0 07/16/2017 0630   GLUCOSEU NEGATIVE 07/16/2017 0630   GLUCOSEU NEGATIVE 08/24/2012 1703   HGBUR MODERATE (A) 07/16/2017 0630   BILIRUBINUR NEGATIVE 07/16/2017 0630   BILIRUBINUR Negative 02/12/2016 1208   KETONESUR NEGATIVE 07/16/2017 0630   PROTEINUR 100 (A) 07/16/2017 0630   UROBILINOGEN 0.2 02/12/2016 1208   UROBILINOGEN 0.2 08/24/2012 1703   NITRITE NEGATIVE 07/16/2017 0630   LEUKOCYTESUR LARGE (A) 07/16/2017 0630   Sepsis Labs Invalid input(s): PROCALCITONIN,  WBC,  LACTICIDVEN Microbiology Recent Results (from the past 240 hour(s))  Urine C&S     Status: Abnormal   Collection Time: 07/16/17  6:30 AM  Result Value Ref Range Status   Specimen Description URINE, CLEAN CATCH  Final   Special Requests NONE  Final   Culture >=100,000 COLONIES/mL ESCHERICHIA COLI (A)  Final   Report Status 07/18/2017 FINAL  Final   Organism ID, Bacteria ESCHERICHIA COLI (A)  Final      Susceptibility   Escherichia coli - MIC*    AMPICILLIN >=32 RESISTANT Resistant     CEFAZOLIN <=4 SENSITIVE Sensitive     CEFTRIAXONE <=1 SENSITIVE Sensitive     CIPROFLOXACIN 1 SENSITIVE Sensitive     GENTAMICIN <=1 SENSITIVE Sensitive     IMIPENEM <=0.25 SENSITIVE Sensitive     NITROFURANTOIN <=16 SENSITIVE Sensitive     TRIMETH/SULFA >=320 RESISTANT Resistant     AMPICILLIN/SULBACTAM 16 INTERMEDIATE Intermediate     PIP/TAZO <=4 SENSITIVE Sensitive     Extended ESBL NEGATIVE Sensitive     * >=100,000 COLONIES/mL ESCHERICHIA COLI  Blood culture (routine x 2)     Status: None (Preliminary result)   Collection Time: 07/16/17  7:00 AM  Result Value Ref Range Status   Specimen Description BLOOD LEFT HAND  Final   Special Requests   Final    BOTTLES DRAWN AEROBIC AND ANAEROBIC Blood Culture adequate volume    Culture   Final    NO GROWTH 4 DAYS Performed at [facility], [address], [place], [place] [address]    Report Status PENDING  Incomplete  Blood culture (routine x 2)     Status: Abnormal   Collection Time: 07/16/17  8:07 AM  Result Value Ref Range Status   Specimen Description BLOOD RIGHT ANTECUBITAL  Final   Special Requests   Final    BOTTLES DRAWN AEROBIC AND ANAEROBIC Blood Culture adequate volume   Culture  Setup Time   Final    GRAM NEGATIVE RODS IN BOTH AEROBIC AND ANAEROBIC BOTTLES CRITICAL RESULT CALLED TO, READ BACK BY AND VERIFIED WITHLavell Luster [facility] 0350 07/16/17 A BROWNING Performed at [facility], [place] [address], [place], [place] [address]    Culture ESCHERICHIA COLI (A)  Final   Report Status 07/18/2017 FINAL  Final   Organism ID, Bacteria ESCHERICHIA COLI  Final      Susceptibility   Escherichia coli - MIC*    AMPICILLIN >=32 RESISTANT Resistant     CEFAZOLIN <=4 SENSITIVE Sensitive     CEFEPIME <=1 SENSITIVE Sensitive     CEFTAZIDIME <=1 SENSITIVE Sensitive     CEFTRIAXONE <=1 SENSITIVE Sensitive  CIPROFLOXACIN 0.5 SENSITIVE Sensitive     GENTAMICIN <=1 SENSITIVE Sensitive     IMIPENEM <=0.25 SENSITIVE Sensitive     TRIMETH/SULFA >=320 RESISTANT Resistant     AMPICILLIN/SULBACTAM >=32 RESISTANT Resistant     PIP/TAZO <=4 SENSITIVE Sensitive     Extended ESBL NEGATIVE Sensitive     * ESCHERICHIA COLI  Blood Culture ID Panel (Reflexed)     Status: Abnormal   Collection Time: 07/16/17  8:07 AM  Result Value Ref Range Status   Enterococcus species NOT DETECTED NOT DETECTED Final   Listeria monocytogenes NOT DETECTED NOT DETECTED Final   Staphylococcus species NOT DETECTED NOT DETECTED Final   Staphylococcus aureus NOT DETECTED NOT DETECTED Final   Streptococcus species NOT DETECTED NOT DETECTED Final   Streptococcus agalactiae NOT DETECTED NOT DETECTED Final   Streptococcus pneumoniae NOT DETECTED NOT DETECTED Final   Streptococcus  pyogenes NOT DETECTED NOT DETECTED Final   Acinetobacter baumannii NOT DETECTED NOT DETECTED Final   Enterobacteriaceae species DETECTED (A) NOT DETECTED Final    Comment: Enterobacteriaceae represent a large family of gram-negative bacteria, not a single organism. CRITICAL RESULT CALLED TO, READ BACK BY AND VERIFIED WITH: Lavell Luster PHARMD [id number] 07/16/17 A BROWNING    Enterobacter cloacae complex NOT DETECTED NOT DETECTED Final   Escherichia coli DETECTED (A) NOT DETECTED Final    Comment: CRITICAL RESULT CALLED TO, READ BACK BY AND VERIFIED WITH: Lavell Luster PHARMD [id number] 07/16/17 A BROWNING    Klebsiella oxytoca NOT DETECTED NOT DETECTED Final   Klebsiella pneumoniae NOT DETECTED NOT DETECTED Final   Proteus species NOT DETECTED NOT DETECTED Final   Serratia marcescens NOT DETECTED NOT DETECTED Final   Carbapenem resistance NOT DETECTED NOT DETECTED Final   Haemophilus influenzae NOT DETECTED NOT DETECTED Final   Neisseria meningitidis NOT DETECTED NOT DETECTED Final   Pseudomonas aeruginosa NOT DETECTED NOT DETECTED Final   Candida albicans NOT DETECTED NOT DETECTED Final   Candida glabrata NOT DETECTED NOT DETECTED Final   Candida krusei NOT DETECTED NOT DETECTED Final   Candida parapsilosis NOT DETECTED NOT DETECTED Final   Candida tropicalis NOT DETECTED NOT DETECTED Final    Comment: Performed at [facility], [place] [address] [place], [place] [address]     SIGNED:   Donne Hazel, MD  [facility] 07/20/2017, 5:11 PM  If 7PM-7AM, please contact night-coverage [email or web address] Password [email or web address]

## 2017-07-20 NOTE — Progress Notes (Signed)
Reviewed discharge information with patient and caregiver. Answered all questions. Patient/caregiver able to teach back medications and reasons to contact MD/911. Patient verbalizes importance of PCP follow up appointment.  Adnan Vanvoorhis M. Nolah Krenzer, RN  

## 2017-07-20 NOTE — Care Management Important Message (Signed)
Important Message  Patient Details  Name: Shelby Vazquez MRN: [id number] Date of Birth: November 10, 1950   Medicare Important Message Given:  Yes    Erenest Rasher, RN 07/20/2017, 3:09 PM

## 2017-07-21 ENCOUNTER — Ambulatory Visit (INDEPENDENT_AMBULATORY_CARE_PROVIDER_SITE_OTHER): Payer: Medicare Other | Admitting: Family Medicine

## 2017-07-21 ENCOUNTER — Encounter: Payer: Self-pay | Admitting: Family Medicine

## 2017-07-21 ENCOUNTER — Telehealth: Payer: Self-pay | Admitting: Family Medicine

## 2017-07-21 VITALS — BP 122/78 | HR 95 | Temp 98.4°F | Wt 147.2 lb

## 2017-07-21 DIAGNOSIS — B9620 Unspecified Escherichia coli [E. coli] as the cause of diseases classified elsewhere: Secondary | ICD-10-CM | POA: Diagnosis not present

## 2017-07-21 DIAGNOSIS — R339 Retention of urine, unspecified: Secondary | ICD-10-CM

## 2017-07-21 DIAGNOSIS — R519 Headache, unspecified: Secondary | ICD-10-CM

## 2017-07-21 DIAGNOSIS — N12 Tubulo-interstitial nephritis, not specified as acute or chronic: Secondary | ICD-10-CM | POA: Diagnosis not present

## 2017-07-21 DIAGNOSIS — I10 Essential (primary) hypertension: Secondary | ICD-10-CM

## 2017-07-21 DIAGNOSIS — R51 Headache: Secondary | ICD-10-CM | POA: Diagnosis not present

## 2017-07-21 LAB — CULTURE, BLOOD (ROUTINE X 2)
CULTURE: NO GROWTH
SPECIAL REQUESTS: ADEQUATE

## 2017-07-21 LAB — SEDIMENTATION RATE: SED RATE: 18 mm/h (ref 0–30)

## 2017-07-21 NOTE — Assessment & Plan Note (Signed)
Chronic, she did restart diazide - took diuretic this morning. Will continue for now.

## 2017-07-21 NOTE — Patient Instructions (Signed)
Finish vantin. Labs today. See Rosaria Ferries to schedule head CT.  We will be in touch with results.

## 2017-07-21 NOTE — Telephone Encounter (Signed)
Left message asking pt to call office.  Please let pt know about appointment scheduled today 10/1 with debbie

## 2017-07-21 NOTE — Assessment & Plan Note (Signed)
H/o cluster headaches, denies h/o migraines.  Current headache ongoing since recent hospitalization for urosepsis with E coli pyelonephritis. Currently of unclear cause. ?dehydration related. Given significant temporal pain, I will check ESR to eval for GCA.  Given markedly positive romberg on exam, I do think rpt CT head is warranted and I have ordered this today.  Discussed all this with patient. If ongoing headaches, she would be interested in referral to headache wellness center.

## 2017-07-21 NOTE — Progress Notes (Signed)
BP 122/78 (BP Location: Left Arm, Patient Position: Sitting, Cuff Size: Normal)   Pulse 95   Temp 98.4 F (36.9 C) (Oral)   Wt 147 lb 4 oz (66.8 kg)   SpO2 96%   BMI 25.28 kg/m    CC: hosp f/u visit Subjective:    Patient ID: Shelby Vazquez, female    DOB: [birth year], 66 y.o.   MRN: [id number]  HPI: Shelby Vazquez is a 66 y.o. female presenting on 07/21/2017 for Hospitalization Follow-up (Discharged from [facility] due to severe HA. Taking toradol. Still having HA. Suggested she have a CT. )   Here with husband.   Recent hospitalization for E coli pyelonephritis. Blood culture x1 and urine culture were positive for E coli, treated with vantin by mouth to complete 10d course. Last fever was yesterday.   Known recurrent UTI with urinary retention followed by Dr Alona Bene at [facility] s/p InterStim placement, managed by intermittent self catheterizations 3x/day, new catheter each time.   Dyazide was held due to dehydration. BP remains well controlled off antihypertensive. She restarted this morning.   She developed bad posterior headache associated with photo/phonophobia without aura, treated in hospital with toradol, declined further evaluation while inpatient because she desired to go home. Discharged on ketorolac PO.  Headache returned last night 9pm, lasted until 10am this morning. Describes occipital headache with radiation to temples. Tylenol, ice pack without improvement. No neck pain. Nothing relieves pain. Toradol does help some. Phenergan helped headache when mild. No vision changes. No h/o migraines that she knows of.  H/o remote cluster headaches noted in EMR (age 56 around menopause, none in years).  MRI 05/2011 for headache with facial droop - no acute CVA. 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  She did have concussion 12/2016 - stumbled into a wall. Seen at ER with reassuring CT. She did have nasal bridge hematoma.   Admit date: 07/16/2017 Discharge date: 07/20/2017  (yesterday) F/u phone call - not done (TCM scheduled too soon)  Admitted From: Home Disposition:  Home  Recommendations for Outpatient Follow-up:  1. Follow up with PCP in 1-2 weeks 2. Please follow up on continued posterior headaches. Patient was offered imaging, however declined further inpatient work up.  Discharge Condition:Improved CODE STATUS:Full Diet recommendation: Regular   Discharge Diagnoses:  Principal Problem:   Acute pyelonephritis Active Problems:   HLD (hyperlipidemia)   GAD (generalized anxiety disorder)   Essential hypertension   Uterine prolapse   MDD (major depressive disorder), recurrent episode, moderate (HCC)   Lower abdominal pain   Recurrent UTI   Diet-controlled diabetes mellitus ([place])  Relevant past medical, surgical, family and social history reviewed and updated as indicated. Interim medical history since our last visit reviewed. Allergies and medications reviewed and updated. Outpatient Medications Prior to Visit  Medication Sig Dispense Refill  . acetaminophen (TYLENOL) 500 MG tablet Take 1,000 mg by mouth every 6 (six) hours as needed for moderate pain.     . Calcium Carbonate-Vitamin D 600-200 MG-UNIT CAPS Take 1 capsule by mouth daily.    Marland Kitchen CARAFATE 1 GM/10ML suspension take 10 milliliters by mouth four times a day if needed 420 mL 3  . cefpodoxime (VANTIN) 200 MG tablet Take 1 tablet (200 mg total) by mouth 2 (two) times daily. 20 tablet 0  . clidinium-chlordiazePOXIDE (LIBRAX) 5-2.5 MG capsule TAKE 1 CAPSULE BY MOUTH TWO TIMES DAILY 180 capsule 1  . famotidine (PEPCID) 40 MG tablet TAKE 2 TABLETS BY MOUTH  AT  BEDTIME 180 tablet 3  . ibuprofen (ADVIL,MOTRIN) 200 MG tablet Take 600 mg by mouth every 6 (six) hours as needed for moderate pain.    Marland Kitchen ketorolac (TORADOL) 10 MG tablet Take 1 tablet (10 mg total) by mouth every 6 (six) hours as needed. 20 tablet 0  . loratadine (CLARITIN) 10 MG tablet Take 10 mg by mouth daily.     . Melatonin 10  MG TBDP Take 20 mg by mouth daily.    . methenamine (HIPREX) 1 g tablet Take 1 g by mouth 2 (two) times daily with a meal.   1  . Multiple Vitamins-Minerals (EQ MULTIVITAMINS ADULT GUMMY PO) Take 2 tablets by mouth daily.    Marland Kitchen PRESCRIPTION MEDICATION Apply topically 2 (two) times daily. Estradiol Progesterone cream 0.05-30 mg/ml    . Probiotic Product (PROBIOTIC PO) Take 2 capsules by mouth 2 (two) times daily.     . RABEprazole (ACIPHEX) 20 MG tablet Take 20 mg by mouth daily.    . ranitidine (ZANTAC) 150 MG tablet Take 150 mg by mouth 2 (two) times daily as needed for heartburn.     . simvastatin (ZOCOR) 40 MG tablet TAKE 1 TABLET BY MOUTH  EVERY EVENING 90 tablet 3  . triamterene-hydrochlorothiazide (DYAZIDE) 37.5-25 MG capsule TAKE 1 CAPSULE BY MOUTH  EVERY MORNING 90 capsule 3  . venlafaxine XR (EFFEXOR-XR) 75 MG 24 hr capsule take 2 capsules by mouth once daily WITH BREAKFAST (Patient taking differently: take 2 capsules by mouth once daily) 60 capsule 4   No facility-administered medications prior to visit.      Per HPI unless specifically indicated in ROS section below Review of Systems     Objective:    BP 122/78 (BP Location: Left Arm, Patient Position: Sitting, Cuff Size: Normal)   Pulse 95   Temp 98.4 F (36.9 C) (Oral)   Wt 147 lb 4 oz (66.8 kg)   SpO2 96%   BMI 25.28 kg/m   Wt Readings from Last 3 Encounters:  07/21/17 147 lb 4 oz (66.8 kg)  07/16/17 155 lb 6.8 oz (70.5 kg)  02/17/17 155 lb 8 oz (70.5 kg)    Physical Exam  Constitutional: She is oriented to person, place, and time. She appears well-developed and well-nourished. No distress.  HENT:  Head: Normocephalic and atraumatic.  Mouth/Throat: Oropharynx is clear and moist. No oropharyngeal exudate.  Eyes: Pupils are equal, round, and reactive to light. Conjunctivae are normal.  Neck: Normal range of motion. Neck supple. No thyromegaly present.  Cardiovascular: Normal rate, regular rhythm, normal heart sounds  and intact distal pulses.   No murmur heard. Pulmonary/Chest: Effort normal and breath sounds normal. No respiratory distress. She has no wheezes. She has no rales.  Abdominal: Soft. Normal appearance and bowel sounds are normal. She exhibits no distension and no mass. There is no tenderness. There is no rebound, no guarding and no CVA tenderness.  Musculoskeletal: She exhibits no edema.  Neurological: She is alert and oriented to person, place, and time. She has normal strength. No cranial nerve deficit or sensory deficit. Coordination abnormal. Gait normal.  CN 2-12 intact FTN intact Gait seemed intact without ataxia noted Marked positive romberg  Skin: Skin is warm and dry. No rash noted.  Psychiatric: She has a normal mood and affect.  Nursing note and vitals reviewed.      Assessment & Plan:   Problem List Items Addressed This Visit    Essential hypertension    Chronic,  she did restart diazide - took diuretic this morning. Will continue for now.       Headache    H/o cluster headaches, denies h/o migraines.  Current headache ongoing since recent hospitalization for urosepsis with E coli pyelonephritis. Currently of unclear cause. ?dehydration related. Given significant temporal pain, I will check ESR to eval for GCA.  Given markedly positive romberg on exam, I do think rpt CT head is warranted and I have ordered this today.  Discussed all this with patient. If ongoing headaches, she would be interested in referral to headache wellness center.       Relevant Medications   venlafaxine XR (EFFEXOR-XR) 75 MG 24 hr capsule   Other Relevant Orders   Sedimentation rate (Completed)   CT Head Wo Contrast   Pyelonephritis due to Escherichia coli [facility] records reviewed. H/o urosepsis with Ecoli growing from 1/2 blood cultures. Seems to be improving on current oral vantin regimen to complete 2 wks abx therapy.       Urinary retention with incomplete bladder emptying     Continue f/u with urology Amalia Hailey @ [facility])           Follow up plan: Return if symptoms worsen or fail to improve.  Ria Bush, MD

## 2017-07-21 NOTE — Telephone Encounter (Signed)
Disregard message.  Made in error

## 2017-07-21 NOTE — Assessment & Plan Note (Addendum)
Continue f/u with urology Amalia Hailey @ [place])

## 2017-07-21 NOTE — Assessment & Plan Note (Signed)
Hospital records reviewed. H/o urosepsis with Ecoli growing from 1/2 blood cultures. Seems to be improving on current oral vantin regimen to complete 2 wks abx therapy.

## 2017-07-22 ENCOUNTER — Ambulatory Visit (INDEPENDENT_AMBULATORY_CARE_PROVIDER_SITE_OTHER)
Admission: RE | Admit: 2017-07-22 | Discharge: 2017-07-22 | Disposition: A | Discharge status: Home / Self Care | Payer: Medicare Other | Source: Ambulatory Visit | Attending: Family Medicine | Admitting: Family Medicine

## 2017-07-22 DIAGNOSIS — R51 Headache: Secondary | ICD-10-CM | POA: Diagnosis not present

## 2017-07-22 DIAGNOSIS — R519 Headache, unspecified: Secondary | ICD-10-CM

## 2017-07-23 ENCOUNTER — Ambulatory Visit: Payer: Medicare Other | Admitting: Family Medicine

## 2017-07-23 ENCOUNTER — Telehealth: Payer: Self-pay | Admitting: Family Medicine

## 2017-07-23 NOTE — Telephone Encounter (Signed)
I notified patient appointment was cancelled for today and she said she saw her results on my chart.

## 2017-07-23 NOTE — Telephone Encounter (Signed)
Ok to cancel appt. Thanks. I've also sent her CT results.

## 2017-07-23 NOTE — Telephone Encounter (Signed)
Noted  

## 2017-07-23 NOTE — Telephone Encounter (Signed)
Patient has an appointment today at 4:30  Patient wanted to know if Dr.G wants her to cancel the appointment because she was seen for hospital follow up on Monday or does Dr Darnell Level want her to be seen to discuss results.  Patient said she's feeling better and her headache is gone. Please advise.  Patient said a detailed message can be left on voice mail.

## 2017-07-29 ENCOUNTER — Telehealth: Payer: Self-pay | Admitting: *Deleted

## 2017-07-29 DIAGNOSIS — D649 Anemia, unspecified: Secondary | ICD-10-CM

## 2017-07-29 DIAGNOSIS — R5383 Other fatigue: Secondary | ICD-10-CM

## 2017-07-29 DIAGNOSIS — R7989 Other specified abnormal findings of blood chemistry: Secondary | ICD-10-CM

## 2017-07-29 DIAGNOSIS — E538 Deficiency of other specified B group vitamins: Secondary | ICD-10-CM

## 2017-07-29 DIAGNOSIS — E440 Moderate protein-calorie malnutrition: Secondary | ICD-10-CM

## 2017-07-29 NOTE — Telephone Encounter (Signed)
Patient left a voicemail stating that she was in the hospital with an infection and saw you afterwards. Patent stated that her thyroid was off and was told to let you know if she continued to have issues. Patient stated that she has no appetite and is extremely exhausted. Patient stated that she has been holding her BP medication, but took it yesterday because her reading was 140/90. Patient stated that her pulse rate has been up to 120. Patient stated that she cleaned a little and was really tired afterwards. Patient wanted to know if you think that she needs more lab work done to check her thyroid because she is not feeling right?

## 2017-07-31 ENCOUNTER — Other Ambulatory Visit (INDEPENDENT_AMBULATORY_CARE_PROVIDER_SITE_OTHER): Payer: Medicare Other

## 2017-07-31 DIAGNOSIS — R7989 Other specified abnormal findings of blood chemistry: Secondary | ICD-10-CM | POA: Diagnosis not present

## 2017-07-31 DIAGNOSIS — D649 Anemia, unspecified: Secondary | ICD-10-CM | POA: Diagnosis not present

## 2017-07-31 DIAGNOSIS — E538 Deficiency of other specified B group vitamins: Secondary | ICD-10-CM

## 2017-07-31 DIAGNOSIS — R5383 Other fatigue: Secondary | ICD-10-CM | POA: Diagnosis not present

## 2017-07-31 DIAGNOSIS — E440 Moderate protein-calorie malnutrition: Secondary | ICD-10-CM

## 2017-07-31 LAB — CBC WITH DIFFERENTIAL/PLATELET
BASOS ABS: 0 10*3/uL (ref 0.0–0.1)
Basophils Relative: 0.6 % (ref 0.0–3.0)
EOS ABS: 0.1 10*3/uL (ref 0.0–0.7)
Eosinophils Relative: 0.9 % (ref 0.0–5.0)
HCT: 39.3 % (ref 36.0–46.0)
Hemoglobin: 12.9 g/dL (ref 12.0–15.0)
LYMPHS ABS: 2 10*3/uL (ref 0.7–4.0)
LYMPHS PCT: 28 % (ref 12.0–46.0)
MCHC: 32.8 g/dL (ref 30.0–36.0)
MCV: 90.9 fl (ref 78.0–100.0)
MONOS PCT: 7.2 % (ref 3.0–12.0)
Monocytes Absolute: 0.5 10*3/uL (ref 0.1–1.0)
NEUTROS ABS: 4.5 10*3/uL (ref 1.4–7.7)
NEUTROS PCT: 63.3 % (ref 43.0–77.0)
PLATELETS: 571 10*3/uL — AB (ref 150.0–400.0)
RBC: 4.32 Mil/uL (ref 3.87–5.11)
RDW: 14.3 % (ref 11.5–15.5)
WBC: 7.1 10*3/uL (ref 4.0–10.5)

## 2017-07-31 LAB — FOLATE: FOLATE: 16.7 ng/mL (ref >5.9)

## 2017-07-31 LAB — BASIC METABOLIC PANEL
BUN: 18 mg/dL (ref 6–23)
CHLORIDE: 99 meq/L (ref 96–112)
CO2: 27 meq/L (ref 19–32)
Calcium: 9 mg/dL (ref 8.4–10.5)
Creatinine, Ser: 0.9 mg/dL (ref 0.40–1.20)
GFR: 66.47 mL/min (ref >60.00)
Glucose, Bld: 147 mg/dL — ABNORMAL HIGH (ref 70–99)
POTASSIUM: 3.9 meq/L (ref 3.5–5.1)
Sodium: 135 mEq/L (ref 135–145)

## 2017-07-31 LAB — IBC PANEL
IRON: 109 ug/dL (ref 42–145)
Saturation Ratios: 24.1 % (ref 20.0–50.0)
TRANSFERRIN: 323 mg/dL (ref 212.0–360.0)

## 2017-07-31 LAB — FERRITIN: FERRITIN: 27.7 ng/mL (ref 10.0–291.0)

## 2017-07-31 LAB — ALBUMIN: ALBUMIN: 4.3 g/dL (ref 3.5–5.2)

## 2017-07-31 LAB — T4, FREE: FREE T4: 0.77 ng/dL (ref 0.60–1.60)

## 2017-07-31 LAB — TSH: TSH: 3.9 u[IU]/mL (ref 0.35–4.50)

## 2017-07-31 LAB — VITAMIN B12: VITAMIN B 12: 1072 pg/mL — AB (ref 211–911)

## 2017-07-31 NOTE — Telephone Encounter (Signed)
PT called for update on 10/9 concerns. She is sch to come in for labs today at 10 am.

## 2017-07-31 NOTE — Telephone Encounter (Signed)
Noted. Fwd to Dr. G. 

## 2017-07-31 NOTE — Telephone Encounter (Signed)
Yes plz have her come in for labs, doesn't have to be fasting.

## 2017-08-04 ENCOUNTER — Telehealth: Payer: Self-pay | Admitting: Family Medicine

## 2017-08-04 ENCOUNTER — Encounter: Payer: Self-pay | Admitting: Family Medicine

## 2017-08-04 ENCOUNTER — Ambulatory Visit (INDEPENDENT_AMBULATORY_CARE_PROVIDER_SITE_OTHER)
Admission: RE | Admit: 2017-08-04 | Discharge: 2017-08-04 | Disposition: A | Discharge status: Home / Self Care | Payer: Medicare Other | Source: Ambulatory Visit | Attending: Family Medicine | Admitting: Family Medicine

## 2017-08-04 ENCOUNTER — Ambulatory Visit (INDEPENDENT_AMBULATORY_CARE_PROVIDER_SITE_OTHER): Payer: Medicare Other | Admitting: Family Medicine

## 2017-08-04 ENCOUNTER — Other Ambulatory Visit (INDEPENDENT_AMBULATORY_CARE_PROVIDER_SITE_OTHER): Payer: Medicare Other

## 2017-08-04 VITALS — BP 112/80 | HR 88 | Temp 98.0°F | Wt 149.5 lb

## 2017-08-04 DIAGNOSIS — N12 Tubulo-interstitial nephritis, not specified as acute or chronic: Secondary | ICD-10-CM

## 2017-08-04 DIAGNOSIS — R0789 Other chest pain: Secondary | ICD-10-CM

## 2017-08-04 DIAGNOSIS — D473 Essential (hemorrhagic) thrombocythemia: Secondary | ICD-10-CM | POA: Diagnosis not present

## 2017-08-04 DIAGNOSIS — I10 Essential (primary) hypertension: Secondary | ICD-10-CM

## 2017-08-04 DIAGNOSIS — R0601 Orthopnea: Secondary | ICD-10-CM | POA: Diagnosis not present

## 2017-08-04 DIAGNOSIS — D75839 Thrombocytosis, unspecified: Secondary | ICD-10-CM

## 2017-08-04 DIAGNOSIS — R0609 Other forms of dyspnea: Secondary | ICD-10-CM | POA: Insufficient documentation

## 2017-08-04 DIAGNOSIS — B9620 Unspecified Escherichia coli [E. coli] as the cause of diseases classified elsewhere: Secondary | ICD-10-CM

## 2017-08-04 DIAGNOSIS — R0600 Dyspnea, unspecified: Secondary | ICD-10-CM | POA: Insufficient documentation

## 2017-08-04 LAB — CBC WITH DIFFERENTIAL/PLATELET
BASOS ABS: 0 10*3/uL (ref 0.0–0.1)
Basophils Relative: 0.7 % (ref 0.0–3.0)
EOS PCT: 1.2 % (ref 0.0–5.0)
Eosinophils Absolute: 0.1 10*3/uL (ref 0.0–0.7)
HCT: 41.3 % (ref 36.0–46.0)
HEMOGLOBIN: 13.5 g/dL (ref 12.0–15.0)
Lymphocytes Relative: 39 % (ref 12.0–46.0)
Lymphs Abs: 2.5 10*3/uL (ref 0.7–4.0)
MCHC: 32.8 g/dL (ref 30.0–36.0)
MCV: 90.8 fl (ref 78.0–100.0)
MONO ABS: 0.4 10*3/uL (ref 0.1–1.0)
Monocytes Relative: 6.2 % (ref 3.0–12.0)
Neutro Abs: 3.4 10*3/uL (ref 1.4–7.7)
Neutrophils Relative %: 52.9 % (ref 43.0–77.0)
Platelets: 460 10*3/uL — ABNORMAL HIGH (ref 150.0–400.0)
RBC: 4.55 Mil/uL (ref 3.87–5.11)
RDW: 14.1 % (ref 11.5–15.5)
WBC: 6.4 10*3/uL (ref 4.0–10.5)

## 2017-08-04 LAB — POC URINALSYSI DIPSTICK (AUTOMATED)
BILIRUBIN UA: NEGATIVE
Glucose, UA: NEGATIVE
Ketones, UA: NEGATIVE
LEUKOCYTES UA: NEGATIVE
NITRITE UA: NEGATIVE
PH UA: 6 (ref 5.0–8.0)
PROTEIN UA: NEGATIVE
RBC UA: NEGATIVE
Spec Grav, UA: 1.015 (ref 1.010–1.025)
UROBILINOGEN UA: 0.2 U/dL

## 2017-08-04 LAB — BRAIN NATRIURETIC PEPTIDE: PRO B NATRI PEPTIDE: 6 pg/mL (ref 0.0–100.0)

## 2017-08-04 LAB — SEDIMENTATION RATE: SED RATE: 14 mm/h (ref 0–30)

## 2017-08-04 NOTE — Telephone Encounter (Signed)
Pt received lab results at today's OV.

## 2017-08-04 NOTE — Patient Instructions (Addendum)
EKG today Chest xray today Repeat platelet count today.  Urinalysis today.

## 2017-08-04 NOTE — Telephone Encounter (Signed)
Best number [phone] Pt called wanting to get her lab results  She has no Internet to check my chart

## 2017-08-04 NOTE — Progress Notes (Signed)
BP 112/80 (BP Location: Left Arm, Patient Position: Sitting, Cuff Size: Normal)   Pulse 88   Temp 98 F (36.7 C) (Oral)   Wt 149 lb 8 oz (67.8 kg)   SpO2 97%   BMI 25.66 kg/m    CC: ongoing fatigue, dizziness Subjective:    Patient ID: Shelby Vazquez, female    DOB: Jun 22, 1951, 66 y.o.   MRN: [id number]  HPI: Shelby Vazquez is a 66 y.o. female presenting on 08/04/2017 for Elevated HR (Started 07/29/17, pulse was 120. This morning, 110. Told not to take BP med 07/20/17 after d/c from hospital. Provided copy of recent readings); Fatigue (Sleeping good but no energy to do daily activities); Dizziness (Sometimes unsteady on feet); and Nausea (in the mornings)   See prior note for details. Recent hospitalization for E coli pyelonephritis with bacteremia. Completed vantin antibiotic course. She also developed posterior headaches that have now resolved. Recent labwork unrevealing. She did have new thrombocytosis, likely reactive (previous reading 07/20/2017 was normal at 307).   Here today with ongoing fatigue, marked dyspnea and tachycardia (after taking a shower), nausea, low appetite, and unsteadiness on feet. She has had some episodes of chest pressure. Progressively worsening despite resting over the past week.   No fevers or urinary symptoms at this time. No cough, no leg swelling.   She has kept track of blood pressure and pulse - 120-160/70-100, HR 80-110. She has been taking intermittent maxzide.   Known recurrent UTI with urinary retention followed by Dr Alona Bene at [facility] s/p InterStim placement, managed by intermittent self catheterizations 3x/day, new catheter each time.   Relevant past medical, surgical, family and social history reviewed and updated as indicated. Interim medical history since our last visit reviewed. Allergies and medications reviewed and updated. Outpatient Medications Prior to Visit  Medication Sig Dispense Refill  . acetaminophen (TYLENOL) 500 MG tablet  Take 1,000 mg by mouth every 6 (six) hours as needed for moderate pain.     . Calcium Carbonate-Vitamin D 600-200 MG-UNIT CAPS Take 1 capsule by mouth daily.    Marland Kitchen CARAFATE 1 GM/10ML suspension take 10 milliliters by mouth four times a day if needed 420 mL 3  . cefpodoxime (VANTIN) 200 MG tablet Take 1 tablet (200 mg total) by mouth 2 (two) times daily. 20 tablet 0  . clidinium-chlordiazePOXIDE (LIBRAX) 5-2.5 MG capsule TAKE 1 CAPSULE BY MOUTH TWO TIMES DAILY 180 capsule 1  . famotidine (PEPCID) 40 MG tablet TAKE 2 TABLETS BY MOUTH AT  BEDTIME 180 tablet 3  . ibuprofen (ADVIL,MOTRIN) 200 MG tablet Take 600 mg by mouth every 6 (six) hours as needed for moderate pain.    Marland Kitchen ketorolac (TORADOL) 10 MG tablet Take 1 tablet (10 mg total) by mouth every 6 (six) hours as needed. 20 tablet 0  . loratadine (CLARITIN) 10 MG tablet Take 10 mg by mouth daily.     . Melatonin 10 MG TBDP Take 20 mg by mouth daily.    . methenamine (HIPREX) 1 g tablet Take 1 g by mouth 2 (two) times daily with a meal.   1  . Multiple Vitamins-Minerals (EQ MULTIVITAMINS ADULT GUMMY PO) Take 2 tablets by mouth daily.    Marland Kitchen PRESCRIPTION MEDICATION Apply topically 2 (two) times daily. Estradiol Progesterone cream 0.05-30 mg/ml    . Probiotic Product (PROBIOTIC PO) Take 2 capsules by mouth 2 (two) times daily.     . RABEprazole (ACIPHEX) 20 MG tablet Take 20 mg by mouth daily.    Marland Kitchen  ranitidine (ZANTAC) 150 MG tablet Take 150 mg by mouth 2 (two) times daily as needed for heartburn.     . simvastatin (ZOCOR) 40 MG tablet TAKE 1 TABLET BY MOUTH  EVERY EVENING 90 tablet 3  . venlafaxine XR (EFFEXOR-XR) 75 MG 24 hr capsule Take 1 capsule (75 mg total) by mouth at bedtime.    . triamterene-hydrochlorothiazide (DYAZIDE) 37.5-25 MG capsule TAKE 1 CAPSULE BY MOUTH  EVERY MORNING 90 capsule 3   No facility-administered medications prior to visit.      Per HPI unless specifically indicated in ROS section below Review of Systems       Objective:    BP 112/80 (BP Location: Left Arm, Patient Position: Sitting, Cuff Size: Normal)   Pulse 88   Temp 98 F (36.7 C) (Oral)   Wt 149 lb 8 oz (67.8 kg)   SpO2 97%   BMI 25.66 kg/m   Wt Readings from Last 3 Encounters:  08/04/17 149 lb 8 oz (67.8 kg)  07/21/17 147 lb 4 oz (66.8 kg)  07/16/17 155 lb 6.8 oz (70.5 kg)    Physical Exam  Constitutional: She appears well-developed and well-nourished. No distress.  HENT:  Mouth/Throat: Oropharynx is clear and moist. No oropharyngeal exudate.  Eyes: Pupils are equal, round, and reactive to light. Conjunctivae are normal.  Neck: Normal range of motion. Neck supple. No hepatojugular reflux and no JVD present. No thyromegaly present.  Cardiovascular: Normal rate, regular rhythm, normal heart sounds and intact distal pulses.   No murmur heard. Pulmonary/Chest: Effort normal and breath sounds normal. No respiratory distress. She has no wheezes. She has no rales.  More short winded when laying supine  Abdominal: Soft. Normal appearance and bowel sounds are normal. She exhibits no distension and no mass. There is no hepatosplenomegaly. There is no tenderness. There is no rigidity, no rebound, no guarding, no CVA tenderness and negative Murphy's sign.  Musculoskeletal: She exhibits no edema.  Lymphadenopathy:    She has no cervical adenopathy.  Skin: Skin is warm and dry. No rash noted.  Psychiatric: She has a normal mood and affect.  Nursing note and vitals reviewed.  Results for orders placed or performed in visit on 08/04/17  POCT Urinalysis Dipstick (Automated)  Result Value Ref Range   Color, UA yellow    Clarity, UA clear    Glucose, UA negative    Bilirubin, UA negative    Ketones, UA negative    Spec Grav, UA 1.015 1.010 - 1.025   Blood, UA negative    pH, UA 6.0 5.0 - 8.0   Protein, UA negative    Urobilinogen, UA 0.2 0.2 or 1.0 E.U./dL   Nitrite, UA negative    Leukocytes, UA Negative Negative   EKG - NSR rate  80s, LAD, normal intervals, no acute ST/T changes, good R wave progression.      Assessment & Plan:   Problem List Items Addressed This Visit    Essential hypertension    Given ongoing unsteadiness and overall stable bp readings, I did suggest she hold maxzide for now - and if persistently elevated bp readings to let me know to start different antihypertensive.       Orthopnea    New orthopnea, chest discomfort, malaise and unsteadiness over last 1-2 wks, not improving as would be expected from deconditioning after recent hospitalization.  EKG ok today. CXR clear on my read.  Check labs today including BNP.       Relevant Orders  Brain natriuretic peptide   Pyelonephritis due to Escherichia coli    Completed vantin.      Relevant Orders   POCT Urinalysis Dipstick (Automated) (Completed)   Thrombocytosis (HCC) - Primary    New, developed over the last 2 weeks, presumed reactive. Will rpt labs as well as ESR and periph smear. Check CXR today.       Relevant Orders   DG Chest 2 View   Pathologist smear review   CBC with Differential/Platelet   Sedimentation rate    Other Visit Diagnoses    Chest discomfort       Relevant Orders   DG Chest 2 View   EKG 12-Lead (Completed)   Brain natriuretic peptide       Follow up plan: Return if symptoms worsen or fail to improve.  Ria Bush, MD

## 2017-08-04 NOTE — Assessment & Plan Note (Signed)
New, developed over the last 2 weeks, presumed reactive. Will rpt labs as well as ESR and periph smear. Check CXR today.

## 2017-08-04 NOTE — Assessment & Plan Note (Signed)
Completed vantin.

## 2017-08-04 NOTE — Assessment & Plan Note (Signed)
Given ongoing unsteadiness and overall stable bp readings, I did suggest she hold maxzide for now - and if persistently elevated bp readings to let me know to start different antihypertensive.

## 2017-08-04 NOTE — Assessment & Plan Note (Addendum)
New orthopnea, chest discomfort, malaise and unsteadiness over last 1-2 wks, not improving as would be expected from deconditioning after recent hospitalization.  EKG ok today. CXR clear on my read.  Check labs today including BNP.

## 2017-08-05 LAB — PATHOLOGIST SMEAR REVIEW

## 2017-08-07 ENCOUNTER — Ambulatory Visit: Payer: Medicare Other

## 2017-08-08 ENCOUNTER — Encounter: Payer: Self-pay | Admitting: Family Medicine

## 2017-08-18 ENCOUNTER — Encounter: Payer: Self-pay | Admitting: Family Medicine

## 2017-08-18 ENCOUNTER — Ambulatory Visit (INDEPENDENT_AMBULATORY_CARE_PROVIDER_SITE_OTHER): Payer: Medicare Other | Admitting: Family Medicine

## 2017-08-18 VITALS — BP 158/100 | HR 95 | Temp 97.6°F | Wt 153.0 lb

## 2017-08-18 DIAGNOSIS — R0609 Other forms of dyspnea: Secondary | ICD-10-CM | POA: Diagnosis not present

## 2017-08-18 DIAGNOSIS — R0600 Dyspnea, unspecified: Secondary | ICD-10-CM

## 2017-08-18 MED ORDER — ASPIRIN 81 MG PO TABS: 81.0000 mg | ORAL_TABLET | Freq: Every day | ORAL | Status: AC

## 2017-08-18 MED ORDER — METOPROLOL TARTRATE 25 MG PO TABS: 25.0000 mg | ORAL_TABLET | Freq: Two times a day (BID) | ORAL | 3 refills | Status: DC

## 2017-08-18 NOTE — Assessment & Plan Note (Addendum)
Marked fatigue associated with exertional dyspnea, chest discomfort and lightheadedness after recent hospitalization. She also has had elevated blood pressures and mild tachycardia (after discontinuing maxzide). Workup unrevealing overall. She does have fmhx CAD. I did recommend cardiology evaluation to r/o cardiac cause of symptoms. In interim, start metoprolol 25mg  bid and aspirin 81mg  daily. Pt agrees with plan.  nonfocal neurological exam. Consider neurology eval for new onset unsteadiness/imbalance and marked fatigue if unrevealing.

## 2017-08-18 NOTE — Progress Notes (Addendum)
BP (!) 158/100 (BP Location: Right Arm, Vazquez Size: Normal)   Pulse 95   Temp 97.6 F (36.4 C) (Oral)   Wt 153 lb (69.4 kg)   SpO2 98%   BMI 26.26 kg/m    CC: 6 mo f/u visit Subjective:    Patient ID: Shelby Vazquez, female    DOB: 1950/10/23, 66 y.o.   MRN: [id number]  HPI: Shelby Vazquez is a 66 y.o. female presenting on 08/18/2017 for 6 mo follow-up (Pt provided recent BP and HR readings)   Urinary retention treated with intermittent self catheterizations and interblader stim implant, followed by urology (Dr Amalia Hailey). Recent hospitalization for E coli pyelonephritis with bacteremia treated with vantin antibiotic course, she also had posterior headaches that have resolved but has had persistent residual fatigue since hospitalization (see below).   Marked fatigue with exertion associated with occasional nausea, HA, faintness, and chest discomfort described as burning with trouble catching breath. More noticeable after shower. describes sense of imbalance - ataxia. Persistent She brings log of blood pressures and pulse - 130-150/80-90s, 80-110s. Denies paresthesias numbness or weakness. No vertigo, presyncope. No cough or leg swelling. Despite feeling like this, she continues "pushing through". She has increased protein in diet.   Last visit we held maxzide in case hypotension contributing to fatigue. Labs and CXR overall ok as well as EKG.   Never smoker. No h/o asthma/COPD. Strong fmhx CAD.   Relevant past medical, surgical, family and social history reviewed and updated as indicated. Interim medical history since our last visit reviewed. Allergies and medications reviewed and updated. Outpatient Medications Prior to Visit  Medication Sig Dispense Refill  . acetaminophen (TYLENOL) 500 MG tablet Take 1,000 mg by mouth every 6 (six) hours as needed for moderate pain.     . Calcium Carbonate-Vitamin D 600-200 MG-UNIT CAPS Take 1 capsule by mouth daily.    Marland Kitchen CARAFATE 1 GM/10ML  suspension take 10 milliliters by mouth four times a day if needed 420 mL 3  . clidinium-chlordiazePOXIDE (LIBRAX) 5-2.5 MG capsule TAKE 1 CAPSULE BY MOUTH TWO TIMES DAILY 180 capsule 1  . famotidine (PEPCID) 40 MG tablet TAKE 2 TABLETS BY MOUTH AT  BEDTIME 180 tablet 3  . ibuprofen (ADVIL,MOTRIN) 200 MG tablet Take 600 mg by mouth every 6 (six) hours as needed for moderate pain.    Marland Kitchen loratadine (CLARITIN) 10 MG tablet Take 10 mg by mouth daily.     . Melatonin 10 MG TBDP Take 20 mg by mouth daily.    . methenamine (HIPREX) 1 g tablet Take 1 g by mouth 2 (two) times daily with a meal.   1  . Multiple Vitamins-Minerals (EQ MULTIVITAMINS ADULT GUMMY PO) Take 2 tablets by mouth daily.    Marland Kitchen PRESCRIPTION MEDICATION Apply topically 2 (two) times daily. Estradiol Progesterone cream 0.05-30 mg/ml    . Probiotic Product (PROBIOTIC PO) Take 2 capsules by mouth 2 (two) times daily.     . RABEprazole (ACIPHEX) 20 MG tablet Take 20 mg by mouth daily.    . ranitidine (ZANTAC) 150 MG tablet Take 150 mg by mouth 2 (two) times daily as needed for heartburn.     . simvastatin (ZOCOR) 40 MG tablet TAKE 1 TABLET BY MOUTH  EVERY EVENING 90 tablet 3  . venlafaxine XR (EFFEXOR-XR) 75 MG 24 hr capsule Take 1 capsule (75 mg total) by mouth at bedtime.    . cefpodoxime (VANTIN) 200 MG tablet Take 1 tablet (200 mg total) by mouth  2 (two) times daily. 20 tablet 0  . ketorolac (TORADOL) 10 MG tablet Take 1 tablet (10 mg total) by mouth every 6 (six) hours as needed. 20 tablet 0   No facility-administered medications prior to visit.      Per HPI unless specifically indicated in ROS section below Review of Systems     Objective:    BP (!) 158/100 (BP Location: Right Arm, Vazquez Size: Normal)   Pulse 95   Temp 97.6 F (36.4 C) (Oral)   Wt 153 lb (69.4 kg)   SpO2 98%   BMI 26.26 kg/m   Wt Readings from Last 3 Encounters:  08/18/17 153 lb (69.4 kg)  08/04/17 149 lb 8 oz (67.8 kg)  07/21/17 147 lb 4 oz (66.8 kg)      Physical Exam  Constitutional: She is oriented to person, place, and time. She appears well-developed and well-nourished. No distress.  HENT:  Mouth/Throat: Oropharynx is clear and moist. No oropharyngeal exudate.  Eyes: Pupils are equal, round, and reactive to light. Conjunctivae and EOM are normal. No scleral icterus.  Neck: Normal range of motion. Neck supple.  Cardiovascular: Normal rate, regular rhythm, normal heart sounds and intact distal pulses.   No murmur heard. Pulmonary/Chest: Effort normal and breath sounds normal. No respiratory distress. She has no wheezes. She has no rales.  Musculoskeletal: She exhibits no edema.  Neurological: She is alert and oriented to person, place, and time. She has normal strength. No cranial nerve deficit or sensory deficit. Coordination and gait (unsteady) abnormal.  CN 2-12 intact EOMI FTN overall intact Unsteady with romberg but without marked ataxia  Skin: Skin is warm and dry. No rash noted.  Psychiatric: She has a normal mood and affect.  Nursing note and vitals reviewed.  Results for orders placed or performed in visit on 08/04/17  Brain natriuretic peptide  Result Value Ref Range   Pro B Natriuretic peptide (BNP) 6.0 0.0 - 100.0 pg/mL   Lab Results  Component Value Date   ALT 23 07/17/2017   AST 26 07/17/2017   ALKPHOS 53 07/17/2017   BILITOT 0.7 07/17/2017    Lab Results  Component Value Date   CREATININE 0.90 07/31/2017    Lab Results  Component Value Date   WBC 6.4 08/04/2017   HGB 13.5 08/04/2017   HCT 41.3 08/04/2017   MCV 90.8 08/04/2017   PLT 460.0 (H) 08/04/2017    Orthostatic vital signs negative today. Maintains good O2 sats with ambulatory pulse ox (96-97%).     Assessment & Plan:   Problem List Items Addressed This Visit    Exertional dyspnea - Primary    Marked fatigue associated with exertional dyspnea, chest discomfort and lightheadedness after recent hospitalization. She also has had elevated blood  pressures and mild tachycardia (after discontinuing maxzide). Workup unrevealing overall. She does have fmhx CAD. I did recommend cardiology evaluation to r/o cardiac cause of symptoms. In interim, start metoprolol 25mg  bid and aspirin 81mg  daily. Pt agrees with plan.  nonfocal neurological exam. Consider neurology eval for new onset unsteadiness/imbalance and marked fatigue if unrevealing.       Relevant Orders   Ambulatory referral to Cardiology       Follow up plan: Return if symptoms worsen or fail to improve.  Ria Bush, MD

## 2017-08-18 NOTE — Patient Instructions (Addendum)
For ongoing fatigue I would like to refer you to cardiology for evaluation.  Ambulatory pulse ox today, orthostatic vital signs today.  See Rosaria Ferries for referral to cardiology. Start aspirin 81mg  daily, start metoprolol 25mg  twice daily

## 2017-08-19 ENCOUNTER — Other Ambulatory Visit: Payer: Self-pay | Admitting: Family Medicine

## 2017-08-21 NOTE — Telephone Encounter (Signed)
Rx faxed to [facility]

## 2017-08-21 NOTE — Telephone Encounter (Signed)
Printed and in Lisa's box.  

## 2017-09-04 ENCOUNTER — Ambulatory Visit: Payer: Medicare Other | Admitting: Cardiology

## 2017-09-08 ENCOUNTER — Other Ambulatory Visit: Payer: Self-pay

## 2017-09-08 ENCOUNTER — Ambulatory Visit: Payer: Medicare Other | Admitting: Cardiology

## 2017-09-08 ENCOUNTER — Encounter: Payer: Self-pay | Admitting: Cardiology

## 2017-09-08 VITALS — BP 158/103 | HR 96 | Ht 64.0 in | Wt 152.0 lb

## 2017-09-08 DIAGNOSIS — Z01818 Encounter for other preprocedural examination: Secondary | ICD-10-CM | POA: Diagnosis not present

## 2017-09-08 DIAGNOSIS — R079 Chest pain, unspecified: Secondary | ICD-10-CM

## 2017-09-08 DIAGNOSIS — I700 Atherosclerosis of aorta: Secondary | ICD-10-CM | POA: Diagnosis not present

## 2017-09-08 DIAGNOSIS — Z8249 Family history of ischemic heart disease and other diseases of the circulatory system: Secondary | ICD-10-CM | POA: Diagnosis not present

## 2017-09-08 DIAGNOSIS — E782 Mixed hyperlipidemia: Secondary | ICD-10-CM

## 2017-09-08 DIAGNOSIS — I10 Essential (primary) hypertension: Secondary | ICD-10-CM

## 2017-09-08 DIAGNOSIS — R0600 Dyspnea, unspecified: Secondary | ICD-10-CM

## 2017-09-08 DIAGNOSIS — R0609 Other forms of dyspnea: Secondary | ICD-10-CM

## 2017-09-08 DIAGNOSIS — R002 Palpitations: Secondary | ICD-10-CM | POA: Insufficient documentation

## 2017-09-08 MED ORDER — METOPROLOL TARTRATE 50 MG PO TABS: 50.0000 mg | ORAL_TABLET | Freq: Two times a day (BID) | ORAL | 6 refills | Status: DC

## 2017-09-08 NOTE — Assessment & Plan Note (Signed)
With HTN & HLD - add to CV Risk Factors -- checking Cor Calcium Score & CT Angiogram.

## 2017-09-08 NOTE — Assessment & Plan Note (Signed)
With CP & DOE - will check Coronary Calcium Score CT & CT Angiogram-CT FFR.

## 2017-09-08 NOTE — Assessment & Plan Note (Signed)
S4 on exam - fam Hx CAD & HTN -->  Check 2 D Echo.

## 2017-09-08 NOTE — Assessment & Plan Note (Signed)
BP high since d/c from Hospital w/ Pyelonephritis --> no longer on Maxide - PCP started Metoprolol 25 mg BID  = will increase Metoprolol to 50 mg BID & consider restarting Maxide.

## 2017-09-08 NOTE — Assessment & Plan Note (Signed)
Currently using portable EKG Monitor --- mostly shows SR-Stach with intermittent PVCs.  No arrhythmia.  Continue to monitor.  Increase Metoprolol to 25 mg BID

## 2017-09-08 NOTE — Progress Notes (Addendum)
PCP: Ria Bush, MD  Clinic Note: Chief Complaint  Patient presents with  . New Patient (Initial Visit)    pt c/o fatigue, pules rises over 120, irrergulr bp, family history of heart disease    HPI: Shelby Vazquez is a 66 y.o. female with a PMH below who presents today for evaluation of multiple complaints: Fatigue, rapid heartbeat, shortness of breath with exertion, family history of CAD at the request of Ria Bush, MD.  Shelby Vazquez was referred after clinic visit with Dr. Danise Mina on October 29.  She noted marked fatigue on exertion with nausea headache and faintness.  She also noted some chest discomfort described as a burning sensation associated with dyspnea.  More notable after shower.  Also noted imbalance. --Started on metoprolol 25 twice daily as well as aspirin 81 mg daily.    Recent Hospitalizations: none  Studies Personally Reviewed - (if available, images/films reviewed: From Epic Chart or Care Everywhere)  Tele strips from portable monitor - NSR, S Tachycardia & occasional PVCs.   Interval History: Shelby Vazquez presents here today with multiple complaints but most notably has noted having spells of fast heartbeats with rates going into the 120s to clinic come out of the sudden.  She also notes these with elevated blood pressures.  These have occurred often times at rest, but all of so have occurred when when  doing simple activities despite such as folding laundry. She is in the process of recovering from a pyelonephritis episode where she was septic and probably hypotensive.  Most of her symptoms have all started since that time.  She apparently had been on Maxide prior to the hospital and that was stopped in the setting of hypotension and shock.  She says that she is finally starting to get her energy back since her hospitalization but still has having difficulty doing her chores around the house.  She is taking multiple naps.  She gets short of breath doing  mild activities.  But she also starting notes having some chest tightness with this dyspnea.  It is mostly associated with complete fatigue.  She has not had any syncope or nursing B, but does get lightheaded and dizzy when her heart rate is racing.  Her daughter brought her a portable monitor which he is using. She tells me that she drinks 3-4 bottles of water a day 1 cup of coffee and 2 cups of decaf tea.  Prior to her illness, she is quite active.   Cardiac review of symptoms if not noted above:  \No PND, orthopnea or edema.  No syncope/near syncope, or TIA/amaurosis fugax symptoms. No melena, hematochezia, hematuria, or epstaxis. No claudication.  ROS: A comprehensive was performed.  Pertinent symptoms noted in HPI Review of Systems  Constitutional: Positive for malaise/fatigue.  HENT: Negative for congestion and nosebleeds.   Respiratory: Negative for cough and wheezing.   Gastrointestinal: Positive for nausea (Not since illness). Negative for abdominal pain, blood in stool and melena.  Genitourinary: Negative for hematuria.  Musculoskeletal: Negative for joint pain.  Neurological: Negative for focal weakness and seizures.  Psychiatric/Behavioral: Negative for depression and memory loss. The patient is nervous/anxious. The patient does not have insomnia.   All other systems reviewed and are negative.  I have reviewed and (if needed) personally updated the patient's problem list, medications, allergies, past medical and surgical history, social and family history.   Past Medical History:  Diagnosis Date  . Abdominal aortic atherosclerosis ([place]) 10/2014   by CT  scan  . B12 deficiency   . Chronic anxiety   . Chronic cough    cyclical cough syndrome  . Complication of anesthesia 9 yrs ago   awake and in severe pain with colonoscopy  . Concussion   . Diet-controlled diabetes mellitus ([place]) 02/22/2017   New dx 02/2017  . GERD (gastroesophageal reflux disease)    severe leading to  chronic cough and hoarseness  . History of UTI   . Hyperlipidemia   . Hypertension   . IBS (irritable bowel syndrome)    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, PB-8 probiotic, healthy diet and exercise  . 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 Osteopenia 01/2015   T -0.8 spine, -1.7 hip  . Renal disorder    UTI  . Uterine prolapse   . Vocal cord paralysis, bilateral partial 2001   evaluated at [facility] - did not require surgery    Past Surgical History:  Procedure Laterality Date  . BUNIONECTOMY     [place] '2000  . CATARACT EXTRACTION     right 01/2010, left may 2011  . COLONOSCOPY WITH PROPOFOL N/A 03/31/2014   sessile serrated adenomas, sigmoid diverticulosis, rpt 3 yrs Cleotis Nipper, MD)  . DILATION AND CURETTAGE OF UTERUS  '77   for molar pregnancy excision  . ESOPHAGOGASTRODUODENOSCOPY N/A 03/31/2014   mult gastric polyps, mild mucosal hemorrhages; ESOPHAGOGASTRODUODENOSCOPY (EGD);  Surgeon: Cleotis Nipper, MD  . ESOPHAGOGASTRODUODENOSCOPY ENDOSCOPY  april 2014    Current Meds  Medication Sig  . acetaminophen (TYLENOL) 500 MG tablet Take 1,000 mg by mouth every 6 (six) hours as needed for moderate pain.   Marland Kitchen aspirin 81 MG tablet Take 1 tablet (81 mg total) by mouth daily.  . Calcium Carbonate-Vitamin D 600-200 MG-UNIT CAPS Take 1 capsule by mouth daily.  Marland Kitchen CARAFATE 1 GM/10ML suspension take 10 milliliters by mouth four times a day if needed  . cyanocobalamin (,VITAMIN B-12,) 1000 MCG/ML injection INJECT INTRAMUSCULARLY 1ML  EVERY 30 DAYS (DISCARD 28  DAYS AFTER FIRST USE.)  . famotidine (PEPCID) 40 MG tablet TAKE 2 TABLETS BY MOUTH AT  BEDTIME  . ibuprofen (ADVIL,MOTRIN) 200 MG tablet Take 600 mg by mouth every 6 (six) hours as needed for moderate pain.  Marland Kitchen LIBRAX 5-2.5 MG capsule TAKE 1 CAPSULE BY MOUTH TWO TIMES DAILY  . loratadine (CLARITIN) 10 MG tablet Take 10 mg by mouth daily.   . Melatonin 10 MG TBDP Take 20 mg  by mouth daily.  . methenamine (HIPREX) 1 g tablet Take 1 g by mouth 2 (two) times daily with a meal.   . Multiple Vitamins-Minerals (EQ MULTIVITAMINS ADULT GUMMY PO) Take 2 tablets by mouth daily.  Marland Kitchen PRESCRIPTION MEDICATION Apply topically 2 (two) times daily. Estradiol Progesterone cream 0.05-30 mg/ml  . Probiotic Product (PROBIOTIC PO) Take 2 capsules by mouth 2 (two) times daily.   . RABEprazole (ACIPHEX) 20 MG tablet Take 20 mg by mouth daily.  . ranitidine (ZANTAC) 150 MG tablet Take 150 mg by mouth 2 (two) times daily as needed for heartburn.   . simvastatin (ZOCOR) 40 MG tablet TAKE 1 TABLET BY MOUTH  EVERY EVENING  . venlafaxine XR (EFFEXOR-XR) 75 MG 24 hr capsule Take 1 capsule (75 mg total) by mouth at bedtime.  . [DISCONTINUED] metoprolol tartrate (LOPRESSOR) 25 MG tablet Take 1 tablet (25 mg total) by mouth 2 (two) times daily.    Allergies  Allergen Reactions  .  Augmentin [Amoxicillin-Pot Clavulanate] Other (See Comments)    Severe diarrhea, rash, worsened acid reflux  . Erythromycin Rash    Social History   Socioeconomic History  . Marital status: Married    Spouse name: None  . Number of children: 2  . Years of education: 51  . Highest education level: None  Social Needs  . Financial resource strain: None  . Food insecurity - worry: None  . Food insecurity - inability: None  . Transportation needs - medical: None  . Transportation needs - non-medical: None  Occupational History  . Occupation: Product manager: Psychologist, sport and exercise [employer]  Tobacco Use  . Smoking status: Never Smoker  . Smokeless tobacco: Never Used  Substance and Sexual Activity  . Alcohol use: Yes    Comment: twice a year  . Drug use: No  . Sexual activity: Yes    Partners: Male  Other Topics Concern  . None  Social History Narrative   Married 1973. 2 daughters '74, '80; 5 grandchildren.    No h/o physical or sexual abuse. 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    Edu: [employer].    Activity: stays active.   Diet: good water, fruits/vegetables daily    family history includes Cancer in her father and maternal aunt; Heart attack in her maternal grandfather and maternal grandmother; Heart attack (age of onset: 53) in her mother; Heart disease in her paternal grandfather and paternal grandmother; Heart disease (age of onset: 6) in her mother; Hyperlipidemia in her brother and mother; Hypertension in her brother and mother.  Wt Readings from Last 3 Encounters:  09/08/17 152 lb (68.9 kg)  08/18/17 153 lb (69.4 kg)  08/04/17 149 lb 8 oz (67.8 kg)    PHYSICAL EXAM BP (!) 158/103 (BP Location: Right Arm, Patient Position: Sitting, Cuff Size: Normal)   Pulse 96   Ht 5\' 4"  (1.626 m)   Wt 152 lb (68.9 kg)   BMI 26.09 kg/m  Physical Exam  Constitutional: She is oriented to person, place, and time. She appears well-developed and well-nourished. No distress.  Appears younger than her stated age.    HENT:  Head: Normocephalic and atraumatic.  Nose: Nose normal.  Mouth/Throat: No oropharyngeal exudate.  Eyes: EOM are normal.  Neck: Neck supple. No hepatojugular reflux and no JVD present. Carotid bruit is not present. No tracheal deviation present. No thyromegaly present.  Cardiovascular: Normal rate, regular rhythm and intact distal pulses.  No extrasystoles are present. PMI is not displaced. Exam reveals gallop and S4. Exam reveals no friction rub.  No murmur heard. Pulmonary/Chest: Effort normal and breath sounds normal. No respiratory distress. She has no wheezes. She has no rales.  Abdominal: Soft. Bowel sounds are normal. She exhibits no distension. There is no tenderness. There is no rebound.  Musculoskeletal: Normal range of motion. She exhibits no edema or deformity.  R great toe - hyperflexed medially as a complication of bunion Sgx -- she uses a wrap to hold it in toward her foot.  Neurological: She is  alert and oriented to person, place, and time. No cranial nerve deficit.  Skin: Skin is warm and dry. No rash noted. No erythema.  Psychiatric: She has a normal mood and affect. Her behavior is normal. Judgment and thought content normal.  Nursing note and vitals reviewed.   Adult ECG Report Not checked   Other studies Reviewed: Additional studies/ records that  were reviewed today include:  Recent Labs:   Lab Results  Component Value Date   CREATININE 0.90 07/31/2017   BUN 18 07/31/2017   NA 135 07/31/2017   K 3.9 07/31/2017   CL 99 07/31/2017   CO2 27 07/31/2017    ASSESSMENT / PLAN: Problem List Items Addressed This Visit    Abdominal aortic atherosclerosis ([place]) (Chronic)    With HTN & HLD - add to CV Risk Factors -- checking Cor Calcium Score & CT Angiogram.      Relevant Medications   metoprolol tartrate (LOPRESSOR) 50 MG tablet   Chest pain with moderate risk for cardiac etiology   Relevant Orders   CT CORONARY MORPH W/CTA COR W/SCORE W/CA W/CM &/OR WO/CM   CT CORONARY FRACTIONAL FLOW RESERVE DATA PREP   CT CORONARY FRACTIONAL FLOW RESERVE FLUID ANALYSIS   Basic metabolic panel   ECHOCARDIOGRAM COMPLETE   Essential hypertension (Chronic)    BP high since d/c from Hospital w/ Pyelonephritis --> no longer on Maxide - PCP started Metoprolol 25 mg BID  = will increase Metoprolol to 50 mg BID & consider restarting Maxide.      Relevant Medications   metoprolol tartrate (LOPRESSOR) 50 MG tablet   Exertional dyspnea - Primary    S4 on exam - fam Hx CAD & HTN -->  Check 2 D Echo.      Relevant Orders   CT CORONARY MORPH W/CTA COR W/SCORE W/CA W/CM &/OR WO/CM   CT CORONARY FRACTIONAL FLOW RESERVE DATA PREP   CT CORONARY FRACTIONAL FLOW RESERVE FLUID ANALYSIS   Basic metabolic panel   ECHOCARDIOGRAM COMPLETE   Family history of premature coronary artery disease    With CP & DOE - will check Coronary Calcium Score CT & CT Angiogram-CT FFR.      HLD  (hyperlipidemia) (Chronic)   Relevant Medications   metoprolol tartrate (LOPRESSOR) 50 MG tablet   Rapid palpitations    Currently using portable EKG Monitor --- mostly shows SR-Stach with intermittent PVCs.  No arrhythmia.  Continue to monitor.  Increase Metoprolol to 25 mg BID       Relevant Orders   CT CORONARY MORPH W/CTA COR W/SCORE W/CA W/CM &/OR WO/CM   CT CORONARY FRACTIONAL FLOW RESERVE DATA PREP   CT CORONARY FRACTIONAL FLOW RESERVE FLUID ANALYSIS   Basic metabolic panel   ECHOCARDIOGRAM COMPLETE    Other Visit Diagnoses    Pre-op testing       Relevant Orders   Basic metabolic panel      Current medicines are reviewed at length with the patient today. (+/- concerns) n/a The following changes have been made: n/a  Patient Instructions  Schedule at [place] has requested that you have an echocardiogram. Echocardiography is a painless test that uses sound waves to create images of your heart. It provides your doctor with information about the size and shape of your heart and how well your heart's chambers and valves are working. This procedure takes approximately one hour. There are no restrictions for this procedure.    SCHEDULE AT [facility] Your physician has requested that you have cardiac CT. Cardiac computed tomography (CT) is a painless test that uses an x-ray machine to take clear, detailed pictures of your heart. For further information please visit [facility]ugeFiesta[facility]tn. Please follow instruction sheet as given. PLEASE HAVE LAB - BMP AT LEAST ONE WEEK PRIOR TO TEST.   CONTINUE TO USE YOUR PORTABLE HEART MONITOR  Your physician recommends that you schedule a follow-up appointment in 2 [place].  Studies Ordered:   Orders Placed This Encounter  Procedures  . CT CORONARY MORPH W/CTA COR W/SCORE W/CA W/CM &/OR WO/CM  . CT CORONARY FRACTIONAL FLOW RESERVE DATA PREP  . CT CORONARY FRACTIONAL FLOW  RESERVE FLUID ANALYSIS  . Basic metabolic panel  . ECHOCARDIOGRAM COMPLETE      Glenetta Hew, M.D., M.S. Interventional Cardiologist   Pager # [phone] Phone # [phone] [address] [place], [place] [address]

## 2017-09-08 NOTE — Patient Instructions (Addendum)
Schedule at [place] has requested that you have an echocardiogram. Echocardiography is a painless test that uses sound waves to create images of your heart. It provides your doctor with information about the size and shape of your heart and how well your heart's chambers and valves are working. This procedure takes approximately one hour. There are no restrictions for this procedure.    SCHEDULE AT [facility] Your physician has requested that you have cardiac CT. Cardiac computed tomography (CT) is a painless test that uses an x-ray machine to take clear, detailed pictures of your heart. For further information please visit [facility]uge[facility]iesta[facility]tn. Please follow instruction sheet as given. PLEASE HAVE LAB - BMP AT LEAST ONE WEEK PRIOR TO TEST.   CONTINUE TO USE YOUR PORTABLE HEART MONITOR   Your physician recommends that you schedule a follow-up appointment in [place].     Please arrive at the [facility] main entrance of [facility] at (30-45 minutes prior to test start time)  [facility] [address] [place], [place] [address] [phone]  Proceed to the [facility] Radiology Department (First Floor).  Please follow these instructions carefully (unless otherwise directed):    On the Night Before the Test: . Drink plenty of water. . Do not consume any caffeinated/decaffeinated beverages or chocolate 12 hours prior to your test. . Do not take any antihistamines 12 hours prior to your test. . If you take Metformin do not take 24 hours prior to test.  On the Day of the Test: . Drink plenty of water. Do not drink any water within one hour of the test. . Do not eat any food 4 hours prior to the test. . You may take your regular medications prior to the test. . IF NOT ON A BETA BLOCKER - Take 50 mg of lopressor (metoprolol) one hour before the test. . HOLD Furosemide morning of the test.  After the  Test: . Drink plenty of water. . After receiving IV contrast, you may experience a mild flushed feeling. This is normal. . On occasion, you may experience a mild rash up to 24 hours after the test. This is not dangerous. If this occurs, you can take Benadryl 25 mg and increase your fluid intake. . If you experience trouble breathing, this can be serious. If it is severe call 911 IMMEDIATELY. If it is mild, please call our office.

## 2017-09-11 ENCOUNTER — Encounter: Payer: Self-pay | Admitting: Cardiology

## 2017-09-20 HISTORY — PX: OTHER SURGICAL HISTORY: SHX169

## 2017-09-20 HISTORY — PX: TRANSTHORACIC ECHOCARDIOGRAM: SHX275

## 2017-09-23 LAB — BASIC METABOLIC PANEL
BUN / CREAT RATIO: 15 (ref 12–28)
BUN: 12 mg/dL (ref 8–27)
CHLORIDE: 101 mmol/L (ref 96–106)
CO2: 24 mmol/L (ref 20–29)
CREATININE: 0.82 mg/dL (ref 0.57–1.00)
Calcium: 9.6 mg/dL (ref 8.7–10.3)
GFR calc Af Amer: 86 mL/min/{1.73_m2} (ref >59)
GFR calc non Af Amer: 75 mL/min/{1.73_m2} (ref >59)
GLUCOSE: 102 mg/dL — AB (ref 65–99)
Potassium: 4.9 mmol/L (ref 3.5–5.2)
SODIUM: 140 mmol/L (ref 134–144)

## 2017-09-24 ENCOUNTER — Ambulatory Visit (HOSPITAL_COMMUNITY): Payer: Medicare Other | Attending: Cardiology

## 2017-09-24 ENCOUNTER — Other Ambulatory Visit: Payer: Self-pay

## 2017-09-24 DIAGNOSIS — R002 Palpitations: Secondary | ICD-10-CM | POA: Diagnosis present

## 2017-09-24 DIAGNOSIS — E785 Hyperlipidemia, unspecified: Secondary | ICD-10-CM | POA: Diagnosis not present

## 2017-09-24 DIAGNOSIS — R079 Chest pain, unspecified: Secondary | ICD-10-CM | POA: Diagnosis not present

## 2017-09-24 DIAGNOSIS — R0609 Other forms of dyspnea: Secondary | ICD-10-CM | POA: Diagnosis present

## 2017-09-24 DIAGNOSIS — I10 Essential (primary) hypertension: Secondary | ICD-10-CM | POA: Insufficient documentation

## 2017-09-24 DIAGNOSIS — R0600 Dyspnea, unspecified: Secondary | ICD-10-CM

## 2017-10-06 ENCOUNTER — Ambulatory Visit (HOSPITAL_COMMUNITY)
Admission: RE | Admit: 2017-10-06 | Discharge: 2017-10-06 | Disposition: A | Discharge status: Home / Self Care | Payer: Medicare Other | Source: Ambulatory Visit | Attending: Cardiology | Admitting: Cardiology

## 2017-10-06 ENCOUNTER — Ambulatory Visit (HOSPITAL_COMMUNITY): Admission: RE | Admit: 2017-10-06 | Payer: Medicare Other | Source: Ambulatory Visit

## 2017-10-06 DIAGNOSIS — R0600 Dyspnea, unspecified: Secondary | ICD-10-CM

## 2017-10-06 DIAGNOSIS — R002 Palpitations: Secondary | ICD-10-CM | POA: Diagnosis not present

## 2017-10-06 DIAGNOSIS — I712 Thoracic aortic aneurysm, without rupture: Secondary | ICD-10-CM | POA: Insufficient documentation

## 2017-10-06 DIAGNOSIS — I2510 Atherosclerotic heart disease of native coronary artery without angina pectoris: Secondary | ICD-10-CM | POA: Diagnosis not present

## 2017-10-06 DIAGNOSIS — R079 Chest pain, unspecified: Secondary | ICD-10-CM | POA: Diagnosis not present

## 2017-10-06 DIAGNOSIS — R0609 Other forms of dyspnea: Secondary | ICD-10-CM

## 2017-10-06 MED ORDER — NITROGLYCERIN 0.4 MG SL SUBL
SUBLINGUAL_TABLET | SUBLINGUAL | Status: AC
  Filled 2017-10-06: qty 2

## 2017-10-06 MED ORDER — NITROGLYCERIN 0.4 MG SL SUBL
0.8000 mg | SUBLINGUAL_TABLET | Freq: Once | SUBLINGUAL | Status: AC
  Administered 2017-10-06: 0.8 mg via SUBLINGUAL
  Filled 2017-10-06: qty 25

## 2017-10-06 MED ORDER — METOPROLOL TARTRATE 5 MG/5ML IV SOLN
5.0000 mg | Freq: Once | INTRAVENOUS | Status: AC
  Administered 2017-10-06: 5 mg via INTRAVENOUS
  Filled 2017-10-06: qty 5

## 2017-10-06 MED ORDER — METOPROLOL TARTRATE 5 MG/5ML IV SOLN
INTRAVENOUS | Status: AC
  Filled 2017-10-06: qty 5

## 2017-10-06 MED ORDER — IOPAMIDOL (ISOVUE-370) INJECTION 76%
INTRAVENOUS | Status: AC
  Administered 2017-10-06: 100 mL
  Filled 2017-10-06: qty 100

## 2017-10-06 NOTE — Progress Notes (Signed)
CT scan completed. Tolerated well. D/C home with husband, Awake and alert. In no distress.

## 2017-10-08 ENCOUNTER — Telehealth: Payer: Self-pay

## 2017-10-08 NOTE — Telephone Encounter (Signed)
Dr. Darnell Level,  Please review labs. Any labs needed? If so, please order. Insurance: [facility] Medicare  When is patient to return for a CPE with you? Please advise.   Thank you, Katha Cabal

## 2017-10-09 ENCOUNTER — Ambulatory Visit: Payer: Medicare Other

## 2017-10-09 NOTE — Telephone Encounter (Signed)
No labs needed at this time. Last physical and medicare wellness visit were both on 02/17/2017. I don't think she needs this until 4/30. Will a medicare wellness visit be covered prior?

## 2017-10-20 DIAGNOSIS — O0289 Other abnormal products of conception: Secondary | ICD-10-CM

## 2017-10-20 DIAGNOSIS — S060X9A Concussion with loss of consciousness of unspecified duration, initial encounter: Secondary | ICD-10-CM | POA: Insufficient documentation

## 2017-10-20 DIAGNOSIS — I10 Essential (primary) hypertension: Secondary | ICD-10-CM | POA: Insufficient documentation

## 2017-10-20 DIAGNOSIS — C58 Malignant neoplasm of placenta: Secondary | ICD-10-CM | POA: Insufficient documentation

## 2017-10-20 DIAGNOSIS — N289 Disorder of kidney and ureter, unspecified: Secondary | ICD-10-CM | POA: Insufficient documentation

## 2017-10-20 DIAGNOSIS — Z87440 Personal history of urinary (tract) infections: Secondary | ICD-10-CM | POA: Insufficient documentation

## 2017-10-20 DIAGNOSIS — T4145XA Adverse effect of unspecified anesthetic, initial encounter: Secondary | ICD-10-CM | POA: Insufficient documentation

## 2017-10-20 DIAGNOSIS — S060XAA Concussion with loss of consciousness status unknown, initial encounter: Secondary | ICD-10-CM | POA: Insufficient documentation

## 2017-10-20 DIAGNOSIS — F419 Anxiety disorder, unspecified: Secondary | ICD-10-CM | POA: Insufficient documentation

## 2017-10-20 DIAGNOSIS — T8859XA Other complications of anesthesia, initial encounter: Secondary | ICD-10-CM | POA: Insufficient documentation

## 2017-10-24 NOTE — Progress Notes (Signed)
Office Visit Note  Patient: Shelby Vazquez             Date of Birth: [birth year]           MRN: [id number]             PCP: Ria Bush, MD Referring: Dian Queen, MD Visit Date: 10/27/2017 Occupation: _0 @    Subjective:  Other (positive ANA )   History of Present Illness: Shelby Vazquez is a 67 y.o. female seen in consultation per request of Dr. Helane Rima. According to patient she has had problems with urinary retention for a long time. In September 2018 she was hospitalized with urosepsis. After discharge she started having generalized pain and fatigue and difficulty walking. She states she was seen by her PCP and had some lab work done. She was also given antihypertensive medications which helped her to some extent. She had a follow-up with her urologist for elevated WBC count. According to patient after that she followed up with Dr. Helane Rima and mentioned her symptoms of fatigue. She states Dr. Helane Rima treated her for iron deficiency anemia and also obtain some lab work which came positive for ANA and past infection with EBV titer. Patient recalls that she had positive mononucleosis in 2012. She says gradually her fatigue has improved. She continues to have some arthralgias. She states she's a stiff in the morning almost in all of her joints. She has not noticed any joint swelling.  Activities of Daily Living:  Patient reports morning stiffness for 3-4 hours.   Patient Denies nocturnal pain.  Difficulty dressing/grooming: Denies Difficulty climbing stairs: Denies Difficulty getting out of chair: Reports Difficulty using hands for taps, buttons, cutlery, and/or writing: Denies   Review of Systems  Constitutional: Positive for fatigue. Negative for night sweats, weight gain, weight loss and weakness.  HENT: Positive for mouth dryness. Negative for mouth sores, trouble swallowing, trouble swallowing and nose dryness.   Eyes: Negative for pain, redness, visual disturbance  and dryness.  Respiratory: Negative for cough, shortness of breath and difficulty breathing.   Cardiovascular: Positive for palpitations. Negative for chest pain, hypertension, irregular heartbeat and swelling in legs/feet.       She has appointment with cardiologist.  Gastrointestinal: Negative for blood in stool, constipation and diarrhea.       History of IBS  Endocrine: Negative for increased urination.  Genitourinary: Positive for difficulty urinating. Negative for vaginal dryness.  Musculoskeletal: Positive for arthralgias, joint pain and morning stiffness. Negative for joint swelling, myalgias, muscle weakness, muscle tenderness and myalgias.  Skin: Negative for color change, rash, hair loss, skin tightness, ulcers and sensitivity to sunlight.  Allergic/Immunologic: Positive for susceptible to infections.  Neurological: Negative for dizziness, memory loss and night sweats.  Hematological: Negative for swollen glands.  Psychiatric/Behavioral: Positive for depressed mood and sleep disturbance. The patient is nervous/anxious.     PMFS History:  Patient Active Problem List   Diagnosis Date Noted  . Renal disorder   . 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 IBS (irritable bowel syndrome)   . Hypertension   . Hyperlipidemia   . History of UTI   . GERD (gastroesophageal reflux disease)   . Concussion   . Complication of anesthesia   . Chronic anxiety   . B12 deficiency   . Chest pain with moderate risk for cardiac etiology 09/08/2017  . Family history of premature coronary artery disease 09/08/2017  . Rapid palpitations 09/08/2017  . Thrombocytosis ([place]) 08/04/2017  .  Exertional dyspnea 08/04/2017  . Pyelonephritis due to Escherichia coli 07/16/2017  . Diet-controlled diabetes mellitus ([place]) 02/22/2017  . Recurrent UTI 02/17/2017  . Urinary retention with incomplete bladder emptying 07/26/2016  . Bilateral hand pain 06/05/2016  . Advanced care planning/counseling  discussion 02/12/2016  . Medicare annual wellness visit, initial 02/12/2016  . Left foot pain 08/16/2015  . Abdominal aortic atherosclerosis ([place]) 10/21/2014  . Lower abdominal pain 09/07/2014  . Trochanteric bursitis of left hip 02/09/2014  . Chronic cough - cyclical 26/94/8546  . Routine health maintenance 01/20/2012  . MDD (major depressive disorder), recurrent episode, moderate ([place]) 11/06/2011  . Itching 11/06/2011  . Headache 06/12/2011  . Vitamin B12 deficiency 11/19/2010  . HLD (hyperlipidemia) 11/19/2010  . GAD (generalized anxiety disorder) 11/19/2010  . Essential hypertension 11/19/2010  . GERD, SEVERE 11/19/2010  . IBS 11/19/2010  . Uterine prolapse 11/19/2010  . Osteopenia 11/19/2010  . PERSONAL HISTORY OF TROPHOBLASTIC DISEASE 11/19/2010  . Vocal cord paralysis, bilateral partial 10/22/1999    Past Medical History:  Diagnosis Date  . Abdominal aortic atherosclerosis ([place]) 10/2014   by CT scan  . B12 deficiency   . Chronic anxiety   . Chronic cough    cyclical cough syndrome  . Complication of anesthesia 9 yrs ago   awake and in severe pain with colonoscopy  . Concussion   . Diet-controlled diabetes mellitus ([place]) 02/22/2017   New dx 02/2017  . GERD (gastroesophageal reflux disease)    severe leading to chronic cough and hoarseness  . History of UTI   . Hyperlipidemia   . Hypertension   . IBS (irritable bowel syndrome)    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, PB-8 probiotic, healthy diet and exercise  . Molar pregnancy with choriocarcinoma [facility]) '77   had surgery with 5 days of chemotherapy  . Osteopenia 01/2015   T -0.8 spine, -1.7 hip  . Renal disorder    UTI  . Uterine prolapse   . Vocal cord paralysis, bilateral partial 2001   evaluated at [facility] - did not require surgery    Family History  Problem Relation Age of Onset  . Cancer Father        Esophagus  . Heart disease Mother 46  . Hyperlipidemia Mother   . Hypertension Mother   . Heart attack  Mother 1  . Cancer Maternal Aunt        breast  . Heart attack Maternal Grandmother   . Heart attack Maternal Grandfather   . Heart disease Paternal Grandmother   . Heart disease Paternal Grandfather   . Hyperlipidemia Brother   . Hypertension Brother   . Diabetes Neg Hx    Past Surgical History:  Procedure Laterality Date  . BUNIONECTOMY     [place] '2000  . CATARACT EXTRACTION     right 01/2010, left may 2011  . COLONOSCOPY WITH PROPOFOL N/A 03/31/2014   sessile serrated adenomas, sigmoid diverticulosis, rpt 3 yrs Cleotis Nipper, MD)  . DILATION AND CURETTAGE OF UTERUS  '77   for molar pregnancy excision  . ESOPHAGOGASTRODUODENOSCOPY N/A 03/31/2014   mult gastric polyps, mild mucosal hemorrhages; ESOPHAGOGASTRODUODENOSCOPY (EGD);  Surgeon: Cleotis Nipper, MD  . ESOPHAGOGASTRODUODENOSCOPY ENDOSCOPY  april 2014  . INTERSTIM IMPLANT PLACEMENT  2018   Social History   Social History Narrative   Married 1973. 2 daughters '74, '80; 5 grandchildren.    No h/o physical or sexual abuse. 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    Edu: [employer].    Activity: stays active.   Diet: good water, fruits/vegetables daily     Objective: Vital Signs: BP (!) 153/92 (BP Location: Left Arm, Patient Position: Sitting, Cuff Size: Normal)   Pulse 77   Resp 15   Ht 5' 4.5" (1.638 m)   Wt 159 lb (72.1 kg)   BMI 26.87 kg/m    Physical Exam  Constitutional: She is oriented to person, place, and time. She appears well-developed and well-nourished.  HENT:  Head: Normocephalic and atraumatic.  Eyes: Conjunctivae and EOM are normal.  Neck: Normal range of motion.  Cardiovascular: Normal rate, regular rhythm, normal heart sounds and intact distal pulses.  Pulmonary/Chest: Effort normal and breath sounds normal.  Abdominal: Soft. Bowel sounds are normal.  Lymphadenopathy:    She has no cervical adenopathy.  Neurological: She  is alert and oriented to person, place, and time.  Skin: Skin is warm and dry. Capillary refill takes less than 2 seconds.  Psychiatric: She has a normal mood and affect. Her behavior is normal.  Nursing note and vitals reviewed.    Musculoskeletal Exam: C-spine and thoracic lumbar spine good range of motion. Shoulder joints elbow joints wrist joints are good range of motion. She has DIP PIP and right first MCP thickening with no synovitis. Hip joints knee joints and ankle joints are good range of motion. She had bilateral hammertoes and right foot first MTP subluxation.  CDAI Exam: No CDAI exam completed.    Investigation: No additional findings.  09/04/17: ANA positive 1:160 speckled  EBV VCA elevated-587, EBV nuclear Ag elevated-196 Ferritin low (12), vitamin D 31  CBC Latest Ref Rng & Units 08/04/2017 07/31/2017 07/20/2017  WBC 4.0 - 10.5 K/uL 6.4 7.1 5.5  Hemoglobin 12.0 - 15.0 g/dL 13.5 12.9 10.6(L)  Hematocrit 36.0 - 46.0 % 41.3 39.3 32.5(L)  Platelets 150.0 - 400.0 K/uL 460.0(H) 571.0(H) 307   CMP Latest Ref Rng & Units 09/23/2017 07/31/2017 07/18/2017  Glucose 65 - 99 mg/dL 102(H) 147(H) 102(H)  BUN 8 - 27 mg/dL _0 Creatinine 0.57 - 1.00 mg/dL 0.82 0.90 0.85  Sodium 134 - 144 mmol/L 140 135 136  Potassium 3.5 - 5.2 mmol/L 4.9 3.9 4.6  Chloride 96 - 106 mmol/L 101 99 106  CO2 20 - 29 mmol/L _1 Calcium 8.7 - 10.3 mg/dL 9.6 9.0 8.7(L)  Total Protein 6.5 - 8.1 g/dL - - -  Total Bilirubin 0.3 - 1.2 mg/dL - - -  Alkaline Phos 38 - 126 U/L - - -  AST 15 - 41 U/L - - -  ALT 14 - 54 U/L - - -    Imaging: Ct Coronary Morph W/cta Cor W/score W/ca W/cm &/or Wo/cm  Addendum Date: 10/06/2017   ADDENDUM REPORT: 10/06/2017 18:31 CLINICAL DATA:  67 year old female with h/o hypertension, hyperlipidemia, DOE and family h/o premature CAD. EXAM: Cardiac/Coronary  CT TECHNIQUE: The patient was scanned on a [employer]. FINDINGS: A 120 kV prospective scan was  triggered in the descending thoracic aorta at 111 HU's. Axial non-contrast 3 mm slices were carried out through the heart. The data set was analyzed on a dedicated work station and scored using the [place]. Gantry rotation speed was 250 msecs and collimation was .6 mm. 5 mg of iv Metoprolol and 0.8 mg of sl NTG was given. The 3D data set was reconstructed in 5% intervals of the 67-82 % of the  R-R cycle. Diastolic phases were analyzed on a dedicated work station using MPR, MIP and VRT modes. The patient received 80 cc of contrast. Aorta: Aneurysmal dilatation of the ascending aorta with maximum diameter 40 x 38 mm. No calcifications. No dissection. Aortic Valve:  Trileaflet.  No calcifications. Coronary Arteries:  Normal coronary origin.  Right dominance. RCA is a large dominant artery that gives rise to PDA and PLA. There is trivial plaque in the proximal RCA. Left main is a short artery that gives rise to LAD and LCX arteries. LAD is a large and long vessel that gives rise to one diagonal artery and wraps around the apex. There is mild calcified plaque in the proximal LAD with associated stenosis 25-50% LCX is a non-dominant artery that gives rise to one OM1 branch. There is minimal calcified plaque in the proximal LCX artery with associated stenosis 0-25%. Other findings: Normal pulmonary vein drainage into the left atrium. Normal let atrial appendage without a thrombus. Normal size of the pulmonary artery. IMPRESSION: 1. Coronary calcium score of 53. This was 59 percentile for age and sex matched control. 2. Normal coronary origin with right dominance. 3. Mild non-obstructive CAD. Aggressive risk factor modification is recommended. 4. Aneurysmal dilatation of the ascending aorta with maximum diameter 40 x 38 mm. Annual follow up with CTA or MRA is recommended. Electronically Signed   By: Ena Dawley   On: 10/06/2017 18:31   Result Date: 10/06/2017 EXAM: OVER-READ INTERPRETATION  CT CHEST The  following report is an over-read performed by radiologist Dr. Barnetta Hammersmith [facility] on 10/06/2017. This over-read does not include interpretation of cardiac or coronary anatomy or pathology. The coronary calcium score/coronary CTA interpretation by the cardiologist is attached. COMPARISON:  None. FINDINGS: Mediastinum/Nodes: No masses or lymphadenopathy. Lungs/Pleura: Lungs show mild bilateral scarring. There is no evidence of pulmonary edema, consolidation, pneumothorax, nodule or pleural fluid in the visualized chest. Upper Abdomen: No acute abnormality. Musculoskeletal: No chest wall mass or suspicious bone lesions identified. IMPRESSION: No significant nonvascular findings in the imaged portions of the chest. Electronically Signed: By: Aletta Edouard M.D. On: 10/06/2017 08:59   Xr Foot 2 Views Left  Result Date: 10/27/2017 All PIP and DIP joint space narrowing was noted. Mild first MTP joint space narrowing was noted. No intertarsal joint space narrowing was noted. A small calcaneal spur was noted. Impression: These findings are consistent with osteoarthritis of the foot.  Xr Foot 2 Views Right  Result Date: 10/27/2017 Right first MTP joint narrowing and subluxation was noted. A pin was noted in the first metatarsal. Postsurgical changes were noted in the first second and third MTP. All PIP/DIP narrowing was noted. No intertarsal joint space narrowing was noted. A small calcaneal spur was noted. Impression: These findings are consistent with osteoarthritis and postsurgical changes in the MTPs.  Xr Hand 2 View Left  Result Date: 10/27/2017 Minimal PIP/DIP narrowing was noted. No MCP joint involvement or erosive changes were noted. Mild spurring in the [facility] joint was noted. No intercarpal or radiocarpal joint space narrowing was noted. 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  Xr Hand 2 View Right  Result Date: 10/27/2017 Minimal PIP/DIP narrowing  was noted. No MCP joint involvement or erosive changes were noted. Mild spurring in the [facility] joint was noted. No intercarpal or radiocarpal joint space narrowing was noted. 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   Speciality Comments: No specialty comments available.  Procedures:  No procedures performed Allergies: Augmentin [amoxicillin-pot clavulanate] and Erythromycin   Assessment / Plan:     Visit Diagnoses: Positive ANA (antinuclear antibody) - 09/04/17: 1:160 Speckled, ESR - patient gives history of fatigue and arthralgias. She had no synovitis on examination. We had detailed discussion regarding positive ANA in association with autoimmune disease. At this point she does not meet criteria for any autoimmune disease. I also discussed that positive ANA could be an acute phase reactant. She had urosepsis prior to the lab work. I'll obtain following labs today.- Plan: ANA, Anti-scleroderma antibody, RNP Antibody, Anti-Smith antibody, Sjogrens syndrome-A extractable nuclear antibody, Sjogrens syndrome-B extractable nuclear antibody, Anti-DNA antibody, double-stranded, C3 and C4. Once lab results are available we can contact her with the lab results.  Pain in both hands . Her clinical findings are consistent with osteoarthritis.- Plan: XR Hand 2 View Right, XR Hand 2 View Left. X-rays were consistent with mild osteoarthritis.  Pain in both feet. She has osteoarthritis involving her PIP/DIP joints on clinical examination. She also has postsurgical changes in her right foot. - Plan: XR Foot 2 Views Right, XR Foot 2 Views Left. X-rays were consistent with osteoarthritis of the feet. Postsurgical changes were noted in the right foot as described above.  Chronic fatigue: According to her the fatigue has improved since she's been on iron.  Elevated Epstein-Barr virus antibody titer: Patient reports history of mononucleosis in 2012.  Osteoporosis, unspecified  osteoporosis type, unspecified pathological fracture presence: According to patient she has osteopenia. I do not have report available.  Other medical problems are listed as follows:  Thrombocytosis ([place])  Essential hypertension  History of sepsis - Hospitalized for UTI/sepsis, follows up with Dr. Lawrence Santiago every 2 months  Abdominal aortic atherosclerosis ([place])  Recurrent UTI  GAD (generalized anxiety disorder)  MDD (major depressive disorder), recurrent episode, moderate ([place])  Diet-controlled diabetes mellitus ([place])  Hyperlipidemia, unspecified hyperlipidemia type  Irritable bowel syndrome, unspecified type  History of gastroesophageal reflux (GERD)    Orders: Orders Placed This Encounter  Procedures  . XR Hand 2 View Right  . XR Hand 2 View Left  . XR Foot 2 Views Right  . XR Foot 2 Views Left  . ANA  . Anti-scleroderma antibody  . RNP Antibody  . Anti-Smith antibody  . Sjogrens syndrome-A extractable nuclear antibody  . Sjogrens syndrome-B extractable nuclear antibody  . Anti-DNA antibody, double-stranded  . C3 and C4   No orders of the defined types were placed in this encounter.   Face-to-face time spent with patient was 50 minutes. Greater than 50% of time was spent in counseling and coordination of care.  Follow-Up Instructions: Return for fatigue.   Bo Merino, MD  Note - This record has been created using Editor, commissioning.  Chart creation errors have been sought, but may not always  have been located. Such creation errors do not reflect on  the standard of medical care.

## 2017-10-27 ENCOUNTER — Encounter: Payer: Self-pay | Admitting: Rheumatology

## 2017-10-27 ENCOUNTER — Ambulatory Visit: Payer: Medicare Other | Admitting: Rheumatology

## 2017-10-27 ENCOUNTER — Ambulatory Visit (INDEPENDENT_AMBULATORY_CARE_PROVIDER_SITE_OTHER): Payer: Self-pay

## 2017-10-27 VITALS — BP 153/92 | HR 77 | Resp 15 | Ht 64.5 in | Wt 159.0 lb

## 2017-10-27 DIAGNOSIS — R768 Other specified abnormal immunological findings in serum: Secondary | ICD-10-CM

## 2017-10-27 DIAGNOSIS — D473 Essential (hemorrhagic) thrombocythemia: Secondary | ICD-10-CM

## 2017-10-27 DIAGNOSIS — Z8619 Personal history of other infectious and parasitic diseases: Secondary | ICD-10-CM

## 2017-10-27 DIAGNOSIS — R5382 Chronic fatigue, unspecified: Secondary | ICD-10-CM | POA: Diagnosis not present

## 2017-10-27 DIAGNOSIS — F411 Generalized anxiety disorder: Secondary | ICD-10-CM

## 2017-10-27 DIAGNOSIS — E119 Type 2 diabetes mellitus without complications: Secondary | ICD-10-CM

## 2017-10-27 DIAGNOSIS — N390 Urinary tract infection, site not specified: Secondary | ICD-10-CM

## 2017-10-27 DIAGNOSIS — Z8719 Personal history of other diseases of the digestive system: Secondary | ICD-10-CM

## 2017-10-27 DIAGNOSIS — M810 Age-related osteoporosis without current pathological fracture: Secondary | ICD-10-CM

## 2017-10-27 DIAGNOSIS — F331 Major depressive disorder, recurrent, moderate: Secondary | ICD-10-CM

## 2017-10-27 DIAGNOSIS — M79672 Pain in left foot: Secondary | ICD-10-CM

## 2017-10-27 DIAGNOSIS — I700 Atherosclerosis of aorta: Secondary | ICD-10-CM | POA: Diagnosis not present

## 2017-10-27 DIAGNOSIS — M79671 Pain in right foot: Secondary | ICD-10-CM

## 2017-10-27 DIAGNOSIS — R760 Raised antibody titer: Secondary | ICD-10-CM

## 2017-10-27 DIAGNOSIS — E785 Hyperlipidemia, unspecified: Secondary | ICD-10-CM

## 2017-10-27 DIAGNOSIS — D75839 Thrombocytosis, unspecified: Secondary | ICD-10-CM

## 2017-10-27 DIAGNOSIS — I10 Essential (primary) hypertension: Secondary | ICD-10-CM

## 2017-10-27 DIAGNOSIS — M79641 Pain in right hand: Secondary | ICD-10-CM | POA: Diagnosis not present

## 2017-10-27 DIAGNOSIS — M79642 Pain in left hand: Secondary | ICD-10-CM

## 2017-10-27 DIAGNOSIS — K589 Irritable bowel syndrome without diarrhea: Secondary | ICD-10-CM

## 2017-10-28 ENCOUNTER — Encounter: Payer: Self-pay | Admitting: Rheumatology

## 2017-10-28 LAB — ANTI-DNA ANTIBODY, DOUBLE-STRANDED: ds DNA Ab: <1 IU/mL

## 2017-10-28 LAB — SJOGRENS SYNDROME-A EXTRACTABLE NUCLEAR ANTIBODY: SSA (Ro) (ENA) Antibody, IgG: <1 AI

## 2017-10-28 LAB — C3 AND C4
C3 Complement: 126 mg/dL (ref 83–193)
C4 Complement: 26 mg/dL (ref 15–57)

## 2017-10-28 LAB — ANTI-NUCLEAR AB-TITER (ANA TITER): ANA Titer 1: 1:320 {titer} — ABNORMAL HIGH

## 2017-10-28 LAB — RNP ANTIBODY: RIBONUCLEIC PROTEIN(ENA) ANTIBODY, IGG: NEGATIVE AI

## 2017-10-28 LAB — ANTI-SMITH ANTIBODY: ENA SM Ab Ser-aCnc: <1 AI

## 2017-10-28 LAB — ANA: ANA: POSITIVE — AB

## 2017-10-28 LAB — SJOGRENS SYNDROME-B EXTRACTABLE NUCLEAR ANTIBODY: SSB (LA) (ENA) ANTIBODY, IGG: NEGATIVE AI

## 2017-10-28 LAB — ANTI-SCLERODERMA ANTIBODY: SCLERODERMA (SCL-70) (ENA) ANTIBODY, IGG: NEGATIVE AI

## 2017-10-29 NOTE — Telephone Encounter (Signed)
Please notify patient that her ANA is low titer and is nonspecific. All other autoimmune labs are negative. No further testing is needed. If she develops any new symptoms in the future we will be happy to evaluate her. She does not need to keep the follow-up visit. She can have repeat ANA with her PCP in one year if her symptoms get worse. Please send a note to her PCP along with the lab results.

## 2017-11-02 ENCOUNTER — Encounter: Payer: Self-pay | Admitting: Family Medicine

## 2017-11-02 DIAGNOSIS — I77810 Thoracic aortic ectasia: Secondary | ICD-10-CM

## 2017-11-02 DIAGNOSIS — I2510 Atherosclerotic heart disease of native coronary artery without angina pectoris: Secondary | ICD-10-CM | POA: Insufficient documentation

## 2017-11-02 HISTORY — DX: Thoracic aortic ectasia: I77.810

## 2017-11-13 ENCOUNTER — Other Ambulatory Visit: Payer: Self-pay | Admitting: Family Medicine

## 2017-11-17 ENCOUNTER — Encounter: Payer: Self-pay | Admitting: Cardiology

## 2017-11-17 ENCOUNTER — Ambulatory Visit: Payer: Medicare Other | Admitting: Cardiology

## 2017-11-17 VITALS — BP 133/80 | HR 63 | Ht 64.5 in | Wt 157.0 lb

## 2017-11-17 DIAGNOSIS — I10 Essential (primary) hypertension: Secondary | ICD-10-CM

## 2017-11-17 DIAGNOSIS — I2510 Atherosclerotic heart disease of native coronary artery without angina pectoris: Secondary | ICD-10-CM | POA: Diagnosis not present

## 2017-11-17 DIAGNOSIS — E782 Mixed hyperlipidemia: Secondary | ICD-10-CM

## 2017-11-17 DIAGNOSIS — R002 Palpitations: Secondary | ICD-10-CM | POA: Diagnosis not present

## 2017-11-17 DIAGNOSIS — I77810 Thoracic aortic ectasia: Secondary | ICD-10-CM | POA: Diagnosis not present

## 2017-11-17 NOTE — Patient Instructions (Signed)
No medication changes-- Recommendations if you develop fast heart rate - you may take 1/2 tablet of your Metoprolol that time ,and take the other have at the schedule time.  please start taking your nighttime dose of METOPROLOL at bedtime.      Your physician wants you to follow-up in  [place].You will receive a reminder letter in the mail two months in advance. If you don't receive a letter, please call our office to schedule the follow-up appointment.   If you need a refill on your cardiac medications before your next appointment, please call your pharmacy.

## 2017-11-17 NOTE — Progress Notes (Signed)
PCP: Ria Bush, MD  Clinic Note: Chief Complaint  Patient presents with  . Follow-up    pt states having some discomfort   . Palpitations    HPI: Shelby Vazquez is a 67 y.o. female with a PMH below who presents today for follow-up evaluation of  Fatigue, rapid heartbeat, shortness of breath with exertion, family history of CAD at the request of Ria Bush, MD.  QUANTA ROBERTSHAW was initially seen back in November 2018 mostly noting exertional dyspnea and fatigue with family history of CAD. She was evaluated with an echocardiogram and coronary CTA. She noted episodes of elevated blood pressure with heart rates going over to the 120s. Often at rest, but also with doing activities such as folding laundry.  referred after clinic visit with Dr. Danise Mina on October 29.  She noted marked fatigue on exertion with nausea headache and faintness.  She also noted some chest discomfort described as a burning sensation associated with dyspnea.  More notable after shower.  Also noted imbalance. --Started on metoprolol 25 twice daily as well as aspirin 81 mg daily.    Recent Hospitalizations: none  Studies Personally Reviewed - (if available, images/films reviewed: From Epic Chart or Care Everywhere)  Tele strips from portable monitor - NSR, S Tachycardia & occasional PVCs.  Transthoracic echo 09/24/2017: Normal LV size and function. EF 55-60%. GR 1 DD. No regional wall motion abnormalities. No significant valvular abnormalities.  Coronary calcium score/CTA: Calcium score 53 . Normal right dominant coronary artery system with mild onset of CAD. Aneurysmal dilation of the ascending aorta with maximum diameter diameter of 4.0-3.8 centimeter. -Recommend annual CT/MRA   Interval History: Trulee presents here today overall feeling much better over the last 2 weeks. She indicates the last 2 weeks she finally started to feel back to her normal self. Only last night was the first time she has  some chest discomfort where she felt her heart rate going up. Otherwise her blood pressures are looking good, and no rapid heartbeat spells. Her fatigue is notably improved since her illness. She was actually very excited, that she was able to push her grandson for a 3 mile walk around the neighborhood without any abnormal symptoms.  Remainder of cardiac review of symptoms: No PND, orthopnea or edema.  No lightheadedness, dizziness, weakness or syncope/near syncope. No TIA/amaurosis fugax symptoms. No claudication.  ROS: A comprehensive was performed.  Pertinent symptoms noted in HPI Review of Systems  Constitutional: Negative for malaise/fatigue (Notably improved).  HENT: Negative for congestion and nosebleeds.   Respiratory: Negative for cough and wheezing.   Gastrointestinal: Negative for abdominal pain, blood in stool, melena and nausea (Not since illness).  Genitourinary: Negative for hematuria.  Musculoskeletal: Negative for joint pain.  Neurological: Negative for focal weakness and seizures.  Psychiatric/Behavioral: Negative for depression and memory loss. The patient is nervous/anxious (Also improving). The patient does not have insomnia.   All other systems reviewed and are negative.  I have reviewed and (if needed) personally updated the patient's problem list, medications, allergies, past medical and surgical history, social and family history.   Past Medical History:  Diagnosis Date  . Abdominal aortic atherosclerosis ([place]) 10/2014   by CT scan  . B12 deficiency   . Chronic anxiety   . Chronic cough    cyclical cough syndrome  . Complication of anesthesia 9 yrs ago   awake and in severe pain with colonoscopy  . Concussion   . Diet-controlled diabetes mellitus ([place]) 02/22/2017  New dx 02/2017  . GERD (gastroesophageal reflux disease)    severe leading to chronic cough and hoarseness  . History of UTI   . Hyperlipidemia   . Hypertension   . IBS (irritable bowel  syndrome)    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, PB-8 probiotic, healthy diet and exercise  . 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 Osteopenia 01/2015   T -0.8 spine, -1.7 hip  . Renal disorder    UTI  . Uterine prolapse   . Vocal cord paralysis, bilateral partial 2001   evaluated at [facility] - did not require surgery    Past Surgical History:  Procedure Laterality Date  . BUNIONECTOMY     [place] '2000  . CATARACT EXTRACTION     right 01/2010, left may 2011  . COLONOSCOPY WITH PROPOFOL N/A 03/31/2014   sessile serrated adenomas, sigmoid diverticulosis, rpt 3 yrs Cleotis Nipper, MD)  . Coronary Calcium Score/CTA  09/2017   Calcium score 53 . Normal right dominant coronary artery system with mild onset of CAD. Aneurysmal dilation of the ascending aorta with maximum diameter diameter of 4.0-3.8 centimeter. -Recommend annual CT/MRA December 2018  . DILATION AND CURETTAGE OF UTERUS  '77   for molar pregnancy excision  . ESOPHAGOGASTRODUODENOSCOPY N/A 03/31/2014   mult gastric polyps, mild mucosal hemorrhages; ESOPHAGOGASTRODUODENOSCOPY (EGD);  Surgeon: Cleotis Nipper, MD  . ESOPHAGOGASTRODUODENOSCOPY ENDOSCOPY  april 2014  . INTERSTIM IMPLANT PLACEMENT  2018  . TRANSTHORACIC ECHOCARDIOGRAM  09/2017   Normal LV size and function. EF 55-60%. GR 1 DD. No regional wall motion abnormalities. No significant valvular abnormalities.    Current Meds  Medication Sig  . acetaminophen (TYLENOL) 500 MG tablet Take 1,000 mg by mouth every 6 (six) hours as needed for moderate pain.   Marland Kitchen aspirin 81 MG tablet Take 1 tablet (81 mg total) by mouth daily.  . Calcium Carbonate-Vitamin D 600-200 MG-UNIT CAPS Take 1 capsule by mouth daily.  Marland Kitchen CARAFATE 1 GM/10ML suspension take 10 milliliters by mouth four times a day if needed  . clidinium-chlordiazePOXIDE (LIBRAX) 5-2.5 MG capsule TAKE 1 CAPSULE BY MOUTH TWO TIMES DAILY  . cyanocobalamin (,VITAMIN  B-12,) 1000 MCG/ML injection INJECT INTRAMUSCULARLY 1ML  EVERY 30 DAYS (DISCARD 28  DAYS AFTER FIRST USE.)  . famotidine (PEPCID) 40 MG tablet TAKE 2 TABLETS BY MOUTH AT  BEDTIME  . ibuprofen (ADVIL,MOTRIN) 200 MG tablet Take 600 mg by mouth every 6 (six) hours as needed for moderate pain.  Marland Kitchen loratadine (CLARITIN) 10 MG tablet Take 10 mg by mouth daily.   . Melatonin 10 MG TBDP Take 20 mg by mouth daily.  . methenamine (HIPREX) 1 g tablet Take 1 g by mouth 2 (two) times daily with a meal.   . metoprolol tartrate (LOPRESSOR) 50 MG tablet Take 1 tablet (50 mg total) 2 (two) times daily by mouth.  . Multiple Vitamins-Minerals (EQ MULTIVITAMINS ADULT GUMMY PO) Take 2 tablets by mouth daily.  Marland Kitchen PRESCRIPTION MEDICATION Apply topically 2 (two) times daily. Estradiol Progesterone cream 0.05-30 mg/ml  . Probiotic Product (PROBIOTIC PO) Take 2 capsules by mouth 2 (two) times daily.   . RABEprazole (ACIPHEX) 20 MG tablet Take 20 mg by mouth daily.  . ranitidine (ZANTAC) 150 MG tablet Take 150 mg by mouth 2 (two) times daily as needed for heartburn.   . simvastatin (ZOCOR) 40 MG tablet TAKE 1 TABLET BY MOUTH  EVERY EVENING  . venlafaxine  XR (EFFEXOR-XR) 75 MG 24 hr capsule Take 1 capsule (75 mg total) by mouth at bedtime.    Allergies  Allergen Reactions  . Augmentin [Amoxicillin-Pot Clavulanate] Other (See Comments)    Severe diarrhea, rash, worsened acid reflux  . Erythromycin Rash    Social History   Socioeconomic History  . Marital status: Married    Spouse name: None  . Number of children: 2  . Years of education: 94  . Highest education level: None  Social Needs  . Financial resource strain: None  . Food insecurity - worry: None  . Food insecurity - inability: None  . Transportation needs - medical: None  . Transportation needs - non-medical: None  Occupational History  . Occupation: Product manager: Psychologist, sport and exercise [employer]  Tobacco Use  . Smoking status: Never Smoker  . Smokeless  tobacco: Never Used  Substance and Sexual Activity  . Alcohol use: Yes    Comment: twice a year  . Drug use: No  . Sexual activity: Yes    Partners: Male  Other Topics Concern  . None  Social History Narrative   Married 1973. 2 daughters '74, '80; 5 grandchildren.    No h/o physical or sexual abuse. 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    Edu: [employer].    Activity: stays active.   Diet: good water, fruits/vegetables daily    family history includes Cancer in her father and maternal aunt; Heart attack in her maternal grandfather and maternal grandmother; Heart attack (age of onset: 38) in her mother; Heart disease in her paternal grandfather and paternal grandmother; Heart disease (age of onset: 24) in her mother; Hyperlipidemia in her brother and mother; Hypertension in her brother and mother.  Wt Readings from Last 3 Encounters:  11/17/17 157 lb (71.2 kg)  10/27/17 159 lb (72.1 kg)  09/08/17 152 lb (68.9 kg)    PHYSICAL EXAM BP 133/80   Pulse 63   Ht 5' 4.5" (1.638 m)   Wt 157 lb (71.2 kg)   SpO2 99%   BMI 26.53 kg/m  Physical Exam  Constitutional: She is oriented to person, place, and time. She appears well-developed and well-nourished. No distress.  Appears younger than her stated age.    HENT:  Head: Normocephalic and atraumatic.  Neck: Neck supple. No hepatojugular reflux and no JVD present. Carotid bruit is not present.  Cardiovascular: Normal rate, regular rhythm and intact distal pulses.  No extrasystoles are present. PMI is not displaced. Exam reveals gallop and S4. Exam reveals no friction rub.  No murmur heard. Pulmonary/Chest: Effort normal and breath sounds normal. No respiratory distress. She has no wheezes. She has no rales.  Abdominal: Soft. Bowel sounds are normal. She exhibits no distension. There is no tenderness. There is no rebound.  Musculoskeletal: Normal range of motion. She  exhibits no edema.  Neurological: She is alert and oriented to person, place, and time.  Skin: Skin is warm and dry.  Psychiatric: She has a normal mood and affect. Her behavior is normal. Judgment and thought content normal.  Nursing note and vitals reviewed.   Adult ECG Report Not checked   Other studies Reviewed: Additional studies/ records that were reviewed today include:  Recent Labs:   Lab Results  Component Value Date   CREATININE 0.82 09/23/2017   BUN 12 09/23/2017   NA 140 09/23/2017   K 4.9 09/23/2017   CL 101 09/23/2017  CO2 24 09/23/2017   Lab Results  Component Value Date   CHOL 166 02/13/2017   HDL 52.20 02/13/2017   LDLCALC 96 02/13/2017   TRIG 92.0 02/13/2017   CHOLHDL 3 02/13/2017    ASSESSMENT / PLAN:  Problem List Items Addressed This Visit    Coronary artery disease, non-occlusive - Cor CA Score 53.  MIldCAD only. - Primary (Chronic)    Mild nonobstructive coronary disease with relatively low coronary calcium score.  Recommendation will be continued risk factor modification.  She is on simvastatin being followed by her PCP.  Her blood pressure is relatively well controlled on her blocker and she is taking aspirin.      Essential hypertension (Chronic)    Blood pressure well controlled.  On beta-blocker at current dose.  If she does show signs of increasing, could consider restarting Maxide.      HLD (hyperlipidemia) (Chronic)    Followed by PCP on simvastatin. Currently her LDL is 96 which is pretty much at goal for her given her coronary calcium score.      Mild ascending aorta dilation (HCC) (Chronic)    Plan to recheck chest CT angiogram in December after follow-up visit. Mildly dilated.  I do not expect this to change. Continue blood pressure control.      Rapid palpitations (Chronic)    Mostly sinus rhythm/sinus tachycardia on her monitor with some intermittent PVCs.  No signs of arrhythmia.  She has not had these episodes that much in  the last couple weeks.  Hopefully there are improving having increased her metoprolol up to 50 mg twice daily.  She can take an additional 25 mg dose as needed for rapid heartbeats.  She would then just take the remaining 25 mg when the next dose is due.         Current medicines are reviewed at length with the patient today. (+/- concerns) n/a The following changes have been made: n/a  Patient Instructions  No medication changes-- Recommendations if you develop fast heart rate - you may take 1/2 tablet of your Metoprolol that time ,and take the other have at the schedule time.  please start taking your nighttime dose of METOPROLOL at bedtime.      Your physician wants you to follow-up in  [place].You will receive a reminder letter in the mail two months in advance. If you don't receive a letter, please call our office to schedule the follow-up appointment.   If you need a refill on your cardiac medications before your next appointment, please call your pharmacy.      Studies Ordered:   No orders of the defined types were placed in this encounter.     Glenetta Hew, M.D., M.S. Interventional Cardiologist   Pager # [phone] Phone # [phone] [address] [place], [place] [address]

## 2017-11-18 ENCOUNTER — Encounter: Payer: Self-pay | Admitting: Cardiology

## 2017-11-18 NOTE — Assessment & Plan Note (Signed)
Blood pressure well controlled.  On beta-blocker at current dose.  If she does show signs of increasing, could consider restarting Maxide.

## 2017-11-18 NOTE — Assessment & Plan Note (Signed)
Mostly sinus rhythm/sinus tachycardia on her monitor with some intermittent PVCs.  No signs of arrhythmia.  She has not had these episodes that much in the last couple weeks.  Hopefully there are improving having increased her metoprolol up to 50 mg twice daily.  She can take an additional 25 mg dose as needed for rapid heartbeats.  She would then just take the remaining 25 mg when the next dose is due.

## 2017-11-18 NOTE — Assessment & Plan Note (Signed)
Plan to recheck chest CT angiogram in December after follow-up visit. Mildly dilated.  I do not expect this to change. Continue blood pressure control.

## 2017-11-18 NOTE — Assessment & Plan Note (Signed)
Mild nonobstructive coronary disease with relatively low coronary calcium score.  Recommendation will be continued risk factor modification.  She is on simvastatin being followed by her PCP.  Her blood pressure is relatively well controlled on her blocker and she is taking aspirin.

## 2017-11-18 NOTE — Assessment & Plan Note (Signed)
Followed by PCP on simvastatin. Currently her LDL is 96 which is pretty much at goal for her given her coronary calcium score.

## 2017-11-30 ENCOUNTER — Other Ambulatory Visit: Payer: Self-pay | Admitting: Family Medicine

## 2017-12-10 ENCOUNTER — Ambulatory Visit: Payer: Medicare Other | Admitting: Family Medicine

## 2017-12-10 ENCOUNTER — Encounter: Payer: Self-pay | Admitting: Family Medicine

## 2017-12-10 VITALS — BP 120/82 | HR 77 | Temp 98.2°F | Wt 158.0 lb

## 2017-12-10 DIAGNOSIS — E782 Mixed hyperlipidemia: Secondary | ICD-10-CM

## 2017-12-10 DIAGNOSIS — Z01818 Encounter for other preprocedural examination: Secondary | ICD-10-CM

## 2017-12-10 DIAGNOSIS — J22 Unspecified acute lower respiratory infection: Secondary | ICD-10-CM | POA: Diagnosis not present

## 2017-12-10 DIAGNOSIS — E611 Iron deficiency: Secondary | ICD-10-CM

## 2017-12-10 MED ORDER — SUCRALFATE 1 GM/10ML PO SUSP: ORAL | 0 refills | Status: DC

## 2017-12-10 MED ORDER — DOXYCYCLINE HYCLATE 100 MG PO TABS: 100.0000 mg | ORAL_TABLET | Freq: Two times a day (BID) | ORAL | 0 refills | Status: DC

## 2017-12-10 MED ORDER — BENZONATATE 100 MG PO CAPS: 100.0000 mg | ORAL_CAPSULE | Freq: Two times a day (BID) | ORAL | 0 refills | Status: DC | PRN

## 2017-12-10 NOTE — Assessment & Plan Note (Signed)
Off simvastatin since late 10/2017 due to possibly worsening myalgias - will recheck FLP in next few weeks off med.

## 2017-12-10 NOTE — Patient Instructions (Addendum)
Schedule lab visit in 2-3 weeks for blood work.  For bronchitis - treat with zpack antibiotic. Push fluids and rest. Tessalon perls for cough throughout the day.  Let us know if not improving with treatment.

## 2017-12-10 NOTE — Assessment & Plan Note (Addendum)
Recently found to be deficient again with low iron stores. Recheck when she returns fasting. She has started daily prenatal vitamin.

## 2017-12-10 NOTE — Assessment & Plan Note (Signed)
Treat aggressively with doxy course given duration and progression of symptoms. Erythromycin allergy - treat with zpack. Tessalon perls for cough. Further supportive care as per instructions. Update if not improving with treatment.

## 2017-12-10 NOTE — Progress Notes (Addendum)
BP 120/82 (BP Location: Left Arm, Patient Position: Sitting, Cuff Size: Normal)   Pulse 77   Temp 98.2 F (36.8 C) (Oral)   Wt 158 lb (71.7 kg)   SpO2 98%   BMI 26.70 kg/m    CC: cough Subjective:    Patient ID: Shelby Vazquez, female    DOB: [birth year], 67 y.o.   MRN: [id number]  HPI: Shelby Vazquez is a 67 y.o. female presenting on 12/10/2017 for Cough (Productive cough, runny nose and hoarseness. Started about 2 wks ago. Cough is worse at night. Tried Nyquil, not helpful. Also, tried Delsym, helpful. )   2 wk h/o cold - rhinorrhea, cough, sore throat. Cough worse at night time. Hoarse voice. Cough keeping her up at night. PNdrainage. Currently chest > head congestion  Denies fevers/chills, ear or tooth pain, headache, dyspnea or wheeze.   Has tried delsym, nyquil.  Grandson sick at home - she cares for him. Daughter sick as well.  No h/o asthma. Non smoker  HLD - off simvastatin over the past month due to possible myalgias. rec recheck chol levels.  OBGYN referred her to rheum - benign exam. Started citranatal bloom - for low iron/ferritin found by rheum. Brings records. Requests A1c, chol recheck off simvastatin.  Saw podiatrist today - requests A1c checked in preparation for possible upcoming surgery.  Requests upcoming labs - A1c, FLP, ferritin Requests carafate which helps soothe throat.   Relevant past medical, surgical, family and social history reviewed and updated as indicated. Interim medical history since our last visit reviewed. Allergies and medications reviewed and updated. Outpatient Medications Prior to Visit  Medication Sig Dispense Refill  . acetaminophen (TYLENOL) 500 MG tablet Take 1,000 mg by mouth every 6 (six) hours as needed for moderate pain.     Marland Kitchen aspirin 81 MG tablet Take 1 tablet (81 mg total) by mouth daily. 30 tablet   . Calcium Carbonate-Vitamin D 600-200 MG-UNIT CAPS Take 1 capsule by mouth daily.    . clidinium-chlordiazePOXIDE (LIBRAX)  5-2.5 MG capsule TAKE 1 CAPSULE BY MOUTH TWO TIMES DAILY 180 capsule 0  . cyanocobalamin (,VITAMIN B-12,) 1000 MCG/ML injection INJECT INTRAMUSCULARLY 1ML  EVERY 30 DAYS (DISCARD 28  DAYS AFTER FIRST USE.) 3 mL 3  . famotidine (PEPCID) 40 MG tablet TAKE 2 TABLETS BY MOUTH AT  BEDTIME 180 tablet 3  . ibuprofen (ADVIL,MOTRIN) 200 MG tablet Take 600 mg by mouth every 6 (six) hours as needed for moderate pain.    Marland Kitchen loratadine (CLARITIN) 10 MG tablet Take 10 mg by mouth daily.     . Melatonin 10 MG TBDP Take 20 mg by mouth daily.    . methenamine (HIPREX) 1 g tablet Take 1 g by mouth 2 (two) times daily with a meal.   1  . Multiple Vitamins-Minerals (EQ MULTIVITAMINS ADULT GUMMY PO) Take 2 tablets by mouth daily.    . Prenatal-DSS-FeCb-FeGl-FA (CITRANATAL BLOOM PO) Take 1 tablet by mouth daily.    Marland Kitchen PRESCRIPTION MEDICATION Apply topically 2 (two) times daily. Estradiol Progesterone cream 0.05-30 mg/ml    . Probiotic Product (PROBIOTIC PO) Take 2 capsules by mouth 2 (two) times daily.     . RABEprazole (ACIPHEX) 20 MG tablet Take 20 mg by mouth daily.    . ranitidine (ZANTAC) 150 MG tablet Take 150 mg by mouth 2 (two) times daily as needed for heartburn.     . venlafaxine XR (EFFEXOR-XR) 75 MG 24 hr capsule take 2 capsules by mouth once  daily WITH BREAKFAST 60 capsule 2  . CARAFATE 1 GM/10ML suspension take 10 milliliters by mouth four times a day if needed 420 mL 3  . metoprolol tartrate (LOPRESSOR) 50 MG tablet Take 1 tablet (50 mg total) 2 (two) times daily by mouth. 60 tablet 6  . simvastatin (ZOCOR) 40 MG tablet TAKE 1 TABLET BY MOUTH  EVERY EVENING (Patient not taking: Reported on 12/10/2017) 90 tablet 3  . venlafaxine XR (EFFEXOR-XR) 75 MG 24 hr capsule Take 1 capsule (75 mg total) by mouth at bedtime.     No facility-administered medications prior to visit.      Per HPI unless specifically indicated in ROS section below Review of Systems     Objective:    BP 120/82 (BP Location: Left  Arm, Patient Position: Sitting, Cuff Size: Normal)   Pulse 77   Temp 98.2 F (36.8 C) (Oral)   Wt 158 lb (71.7 kg)   SpO2 98%   BMI 26.70 kg/m   Wt Readings from Last 3 Encounters:  12/10/17 158 lb (71.7 kg)  11/17/17 157 lb (71.2 kg)  10/27/17 159 lb (72.1 kg)    Physical Exam  Constitutional: She appears well-developed and well-nourished. No distress.  HENT:  Head: Normocephalic and atraumatic.  Right Ear: Hearing, tympanic membrane, external ear and ear canal normal.  Left Ear: Hearing, tympanic membrane, external ear and ear canal normal.  Nose: Mucosal edema present. No rhinorrhea. Right sinus exhibits no maxillary sinus tenderness and no frontal sinus tenderness. Left sinus exhibits no maxillary sinus tenderness and no frontal sinus tenderness.  Mouth/Throat: Uvula is midline and mucous membranes are normal. Posterior oropharyngeal erythema present. No oropharyngeal exudate, posterior oropharyngeal edema or tonsillar abscesses.  Evidently congested  Eyes: Conjunctivae and EOM are normal. Pupils are equal, round, and reactive to light. No scleral icterus.  Neck: Normal range of motion. Neck supple.  Cardiovascular: Normal rate, regular rhythm, normal heart sounds and intact distal pulses.  No murmur heard. Pulmonary/Chest: Effort normal and breath sounds normal. No respiratory distress. She has no wheezes. She has no rales.  Deep harsh cough present  Lymphadenopathy:    She has cervical adenopathy (R AC LAD).  Skin: Skin is warm and dry. No rash noted.  Nursing note and vitals reviewed.  Results for orders placed or performed in visit on 10/27/17  ANA  Result Value Ref Range   Anit Nuclear Antibody(ANA) POSITIVE (A) NEGATIVE  Anti-scleroderma antibody  Result Value Ref Range   Scleroderma (Scl-70) (ENA) Antibody, IgG <1.0 NEG <1.0 NEG AI  RNP Antibody  Result Value Ref Range   Ribonucleic Protein(ENA) Antibody, IgG <1.0 NEG <1.0 NEG AI  Anti-Smith antibody  Result  Value Ref Range   ENA SM Ab Ser-aCnc <1.0 NEG <1.0 NEG AI  Sjogrens syndrome-A extractable nuclear antibody  Result Value Ref Range   SSA (Ro) (ENA) Antibody, IgG <1.0 NEG <1.0 NEG AI  Sjogrens syndrome-B extractable nuclear antibody  Result Value Ref Range   SSB (La) (ENA) Antibody, IgG <1.0 NEG <1.0 NEG AI  Anti-DNA antibody, double-stranded  Result Value Ref Range   ds DNA Ab <1 IU/mL  C3 and C4  Result Value Ref Range   C3 Complement 126 83 - 193 mg/dL   C4 Complement 26 15 - 57 mg/dL  Anti-nuclear ab-titer (ANA titer)  Result Value Ref Range   ANA Pattern 1 SPECKLED (A)    ANA Titer 1 1:320 (H) titer      Assessment & Plan:  Over 25 minutes were spent face-to-face with the patient during this encounter and >50% of that time was spent on counseling and coordination of care  Problem List Items Addressed This Visit    Acute respiratory infection - Primary    Treat aggressively with doxy course given duration and progression of symptoms. Erythromycin allergy - treat with zpack. Tessalon perls for cough. Further supportive care as per instructions. Update if not improving with treatment.       HLD (hyperlipidemia) (Chronic)    Off simvastatin since late 10/2017 due to possibly worsening myalgias - will recheck FLP in next few weeks off med.      Relevant Orders   Lipid panel   Iron deficiency    Recently found to be deficient again with low iron stores. Recheck when she returns fasting. She has started daily prenatal vitamin.      Relevant Orders   CBC with Differential/Platelet   Ferritin   Pre-operative clearance    RCRI score = 0.  Appropriately low risk to proceed with surgery without additional evaluation Reviewed EKG from 07/2017, reassuring.  I have asked her to return for fasting labs in next 2 weeks (A1c, FLP, ferritin)      Relevant Orders   Hemoglobin A1c       Meds ordered this encounter  Medications  . doxycycline (VIBRA-TABS) 100 MG tablet    Sig:  Take 1 tablet (100 mg total) by mouth 2 (two) times daily.    Dispense:  14 tablet    Refill:  0  . sucralfate (CARAFATE) 1 GM/10ML suspension    Sig: take 10 milliliters by mouth four times a day if needed    Dispense:  420 mL    Refill:  0  . benzonatate (TESSALON) 100 MG capsule    Sig: Take 1 capsule (100 mg total) by mouth 2 (two) times daily as needed for cough.    Dispense:  30 capsule    Refill:  0   Orders Placed This Encounter  Procedures  . Lipid panel    Standing Status:   Future    Standing Expiration Date:   12/15/2018  . Hemoglobin A1c    Standing Status:   Future    Standing Expiration Date:   12/15/2018  . CBC with Differential/Platelet    Standing Status:   Future    Standing Expiration Date:   12/15/2018  . Ferritin    Standing Status:   Future    Standing Expiration Date:   12/15/2018    Follow up plan: Return if symptoms worsen or fail to improve.  Ria Bush, MD

## 2017-12-15 DIAGNOSIS — Z01818 Encounter for other preprocedural examination: Secondary | ICD-10-CM | POA: Insufficient documentation

## 2017-12-15 NOTE — Assessment & Plan Note (Signed)
RCRI score = 0.  Appropriately low risk to proceed with surgery without additional evaluation Reviewed EKG from 07/2017, reassuring.  I have asked her to return for fasting labs in next 2 weeks (A1c, FLP, ferritin)

## 2017-12-15 NOTE — Addendum Note (Signed)
Addended by: Ria Bush on: 12/15/2017 07:48 AM   Modules accepted: Orders

## 2017-12-15 NOTE — Addendum Note (Signed)
Addended by: Ria Bush on: 12/15/2017 07:49 AM   Modules accepted: Orders

## 2017-12-17 ENCOUNTER — Ambulatory Visit: Payer: Self-pay | Admitting: Rheumatology

## 2017-12-25 ENCOUNTER — Ambulatory Visit: Payer: Medicare Other | Admitting: Primary Care

## 2017-12-25 ENCOUNTER — Ambulatory Visit (INDEPENDENT_AMBULATORY_CARE_PROVIDER_SITE_OTHER)
Admission: RE | Admit: 2017-12-25 | Discharge: 2017-12-25 | Disposition: A | Discharge status: Home / Self Care | Payer: Medicare Other | Source: Ambulatory Visit | Attending: Primary Care | Admitting: Primary Care

## 2017-12-25 ENCOUNTER — Other Ambulatory Visit (INDEPENDENT_AMBULATORY_CARE_PROVIDER_SITE_OTHER): Payer: Medicare Other

## 2017-12-25 ENCOUNTER — Encounter: Payer: Self-pay | Admitting: Primary Care

## 2017-12-25 VITALS — BP 126/80 | HR 80 | Temp 97.8°F | Ht 64.5 in | Wt 158.0 lb

## 2017-12-25 DIAGNOSIS — R109 Unspecified abdominal pain: Secondary | ICD-10-CM

## 2017-12-25 DIAGNOSIS — E611 Iron deficiency: Secondary | ICD-10-CM

## 2017-12-25 DIAGNOSIS — E782 Mixed hyperlipidemia: Secondary | ICD-10-CM

## 2017-12-25 DIAGNOSIS — Z01818 Encounter for other preprocedural examination: Secondary | ICD-10-CM | POA: Diagnosis not present

## 2017-12-25 LAB — BASIC METABOLIC PANEL
BUN: 13 mg/dL (ref 6–23)
CALCIUM: 9.7 mg/dL (ref 8.4–10.5)
CO2: 29 mEq/L (ref 19–32)
Chloride: 101 mEq/L (ref 96–112)
Creatinine, Ser: 0.83 mg/dL (ref 0.40–1.20)
GFR: 72.89 mL/min (ref >60.00)
GLUCOSE: 117 mg/dL — AB (ref 70–99)
POTASSIUM: 3.9 meq/L (ref 3.5–5.1)
SODIUM: 137 meq/L (ref 135–145)

## 2017-12-25 LAB — POC URINALSYSI DIPSTICK (AUTOMATED)
BILIRUBIN UA: NEGATIVE
Blood, UA: NEGATIVE
GLUCOSE UA: NEGATIVE
KETONES UA: NEGATIVE
Leukocytes, UA: NEGATIVE
NITRITE UA: NEGATIVE
PH UA: 6 (ref 5.0–8.0)
Protein, UA: NEGATIVE
Spec Grav, UA: 1.015 (ref 1.010–1.025)
Urobilinogen, UA: 0.2 E.U./dL

## 2017-12-25 LAB — LIPID PANEL
CHOLESTEROL: 201 mg/dL — AB (ref 0–200)
HDL: 40 mg/dL (ref >39.00)
NonHDL: 160.83
TRIGLYCERIDES: 232 mg/dL — AB (ref 0.0–149.0)
Total CHOL/HDL Ratio: 5
VLDL: 46.4 mg/dL — ABNORMAL HIGH (ref 0.0–40.0)

## 2017-12-25 LAB — FERRITIN: FERRITIN: 21.5 ng/mL (ref 10.0–291.0)

## 2017-12-25 LAB — CBC WITH DIFFERENTIAL/PLATELET
BASOS ABS: 0 10*3/uL (ref 0.0–0.1)
Basophils Relative: 0.4 % (ref 0.0–3.0)
EOS ABS: 0.2 10*3/uL (ref 0.0–0.7)
Eosinophils Relative: 2.6 % (ref 0.0–5.0)
HCT: 41.7 % (ref 36.0–46.0)
HEMOGLOBIN: 13.9 g/dL (ref 12.0–15.0)
LYMPHS PCT: 48.7 % — AB (ref 12.0–46.0)
Lymphs Abs: 2.9 10*3/uL (ref 0.7–4.0)
MCHC: 33.3 g/dL (ref 30.0–36.0)
MCV: 91 fl (ref 78.0–100.0)
Monocytes Absolute: 0.4 10*3/uL (ref 0.1–1.0)
Monocytes Relative: 6.7 % (ref 3.0–12.0)
Neutro Abs: 2.5 10*3/uL (ref 1.4–7.7)
Neutrophils Relative %: 41.6 % — ABNORMAL LOW (ref 43.0–77.0)
Platelets: 351 10*3/uL (ref 150.0–400.0)
RBC: 4.59 Mil/uL (ref 3.87–5.11)
RDW: 13.8 % (ref 11.5–15.5)
WBC: 6 10*3/uL (ref 4.0–10.5)

## 2017-12-25 LAB — HEMOGLOBIN A1C: Hgb A1c MFr Bld: 6.5 % (ref 4.6–6.5)

## 2017-12-25 LAB — LDL CHOLESTEROL, DIRECT: Direct LDL: 109 mg/dL

## 2017-12-25 NOTE — Patient Instructions (Addendum)
Complete xray(s) prior to leaving today. I will notify you of your results once received.  We will be in touch once we receive your urine culture results.  Continue the antibiotics that were prescribed.  You can take Ibuprofen 600 mg every 8 hours as needed for pain.  Make sure to stay hydrated with water.  It was a pleasure meeting you!

## 2017-12-25 NOTE — Progress Notes (Signed)
Subjective:    Patient ID: Shelby Vazquez, female    DOB: 11/30/1950, 67 y.o.   MRN: [id number]  HPI  Shelby Vazquez is a 67 year old female with a history of IBS, uterine prolapse, urinary tract infection, urosepsis who presents today with a chief complaint of flank pain.  She follows with Urology and self catheterizes once nightly before bedtime, voids on her own mostly. She is compliant to methenamine twice daily. Her flank pain is located to the right flank. Her pain woke her from sleep at 1:30 am today.   She denies urinary frequency, dysuria, hematuria, repetitive lifting/over exertion, fevers, radiation of pain, difficulty urinating. She's currently taking Doxycycline 100 mg BID for which she started 3 days ago for respiratory symptoms.    Review of Systems  Constitutional: Negative for fever.  Gastrointestinal: Negative for abdominal pain and nausea.  Genitourinary: Positive for flank pain. Negative for dysuria, frequency, pelvic pain and vaginal discharge.       Past Medical History:  Diagnosis Date  . Abdominal aortic atherosclerosis ([place]) 10/2014   by CT scan  . B12 deficiency   . Chronic anxiety   . Chronic cough    cyclical cough syndrome  . Complication of anesthesia 9 yrs ago   awake and in severe pain with colonoscopy  . Concussion   . Diet-controlled diabetes mellitus ([place]) 02/22/2017   New dx 02/2017  . GERD (gastroesophageal reflux disease)    severe leading to chronic cough and hoarseness  . History of UTI   . Hyperlipidemia   . Hypertension   . IBS (irritable bowel syndrome)    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, PB-8 probiotic, healthy diet and exercise  . 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 Osteopenia 01/2015   T -0.8 spine, -1.7 hip  . Renal disorder    UTI  . Uterine prolapse   . Vocal cord paralysis, bilateral partial 2001   evaluated at [facility] - did not require surgery     Social History    Socioeconomic History  . Marital status: Married    Spouse name: Not on file  . Number of children: 2  . Years of education: 91  . Highest education level: Not on file  Social Needs  . Financial resource strain: Not on file  . Food insecurity - worry: Not on file  . Food insecurity - inability: Not on file  . Transportation needs - medical: Not on file  . Transportation needs - non-medical: Not on file  Occupational History  . Occupation: Product manager: Psychologist, sport and exercise [employer]  Tobacco Use  . Smoking status: Never Smoker  . Smokeless tobacco: Never Used  Substance and Sexual Activity  . Alcohol use: Yes    Comment: twice a year  . Drug use: No  . Sexual activity: Yes    Partners: Male  Other Topics Concern  . Not on file  Social History Narrative   Married 1973. 2 daughters '74, '80; 5 grandchildren.    No h/o physical or sexual abuse. 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    Edu: [employer].    Activity: stays active.   Diet: good water, fruits/vegetables daily    Past Surgical History:  Procedure Laterality Date  . BUNIONECTOMY     [place] '2000  . CATARACT EXTRACTION     right  01/2010, left may 2011  . COLONOSCOPY WITH PROPOFOL N/A 03/31/2014   sessile serrated adenomas, sigmoid diverticulosis, rpt 3 yrs Cleotis Nipper, MD)  . Coronary Calcium Score/CTA  09/2017   Calcium score 53 . Normal right dominant coronary artery system with mild onset of CAD. Aneurysmal dilation of the ascending aorta with maximum diameter diameter of 4.0-3.8 centimeter. -Recommend annual CT/MRA December 2018  . DILATION AND CURETTAGE OF UTERUS  '77   for molar pregnancy excision  . ESOPHAGOGASTRODUODENOSCOPY N/A 03/31/2014   mult gastric polyps, mild mucosal hemorrhages; ESOPHAGOGASTRODUODENOSCOPY (EGD);  Surgeon: Cleotis Nipper, MD  . ESOPHAGOGASTRODUODENOSCOPY ENDOSCOPY  april 2014  . INTERSTIM IMPLANT PLACEMENT   2018  . TRANSTHORACIC ECHOCARDIOGRAM  09/2017   Normal LV size and function. EF 55-60%. GR 1 DD. No regional wall motion abnormalities. No significant valvular abnormalities.    Family History  Problem Relation Age of Onset  . Cancer Father        Esophagus  . Heart disease Mother 37  . Hyperlipidemia Mother   . Hypertension Mother   . Heart attack Mother 35  . Cancer Maternal Aunt        breast  . Heart attack Maternal Grandmother   . Heart attack Maternal Grandfather   . Heart disease Paternal Grandmother   . Heart disease Paternal Grandfather   . Hyperlipidemia Brother   . Hypertension Brother   . Diabetes Neg Hx     Allergies  Allergen Reactions  . Augmentin [Amoxicillin-Pot Clavulanate] Other (See Comments)    Severe diarrhea, rash, worsened acid reflux  . Erythromycin Rash    Current Outpatient Medications on File Prior to Visit  Medication Sig Dispense Refill  . acetaminophen (TYLENOL) 500 MG tablet Take 1,000 mg by mouth every 6 (six) hours as needed for moderate pain.     Marland Kitchen aspirin 81 MG tablet Take 1 tablet (81 mg total) by mouth daily. 30 tablet   . Calcium Carbonate-Vitamin D 600-200 MG-UNIT CAPS Take 1 capsule by mouth daily.    . clidinium-chlordiazePOXIDE (LIBRAX) 5-2.5 MG capsule TAKE 1 CAPSULE BY MOUTH TWO TIMES DAILY 180 capsule 0  . cyanocobalamin (,VITAMIN B-12,) 1000 MCG/ML injection INJECT INTRAMUSCULARLY 1ML  EVERY 30 DAYS (DISCARD 28  DAYS AFTER FIRST USE.) 3 mL 3  . famotidine (PEPCID) 40 MG tablet TAKE 2 TABLETS BY MOUTH AT  BEDTIME 180 tablet 3  . ibuprofen (ADVIL,MOTRIN) 200 MG tablet Take 600 mg by mouth every 6 (six) hours as needed for moderate pain.    Marland Kitchen loratadine (CLARITIN) 10 MG tablet Take 10 mg by mouth daily.     . Melatonin 10 MG TBDP Take 20 mg by mouth daily.    . methenamine (HIPREX) 1 g tablet Take 1 g by mouth 2 (two) times daily with a meal.   1  . Multiple Vitamins-Minerals (EQ MULTIVITAMINS ADULT GUMMY PO) Take 2 tablets by  mouth daily.    . Prenatal-DSS-FeCb-FeGl-FA (CITRANATAL BLOOM PO) Take 1 tablet by mouth daily.    Marland Kitchen PRESCRIPTION MEDICATION Apply topically 2 (two) times daily. Estradiol Progesterone cream 0.05-30 mg/ml    . Probiotic Product (PROBIOTIC PO) Take 2 capsules by mouth 2 (two) times daily.     . RABEprazole (ACIPHEX) 20 MG tablet Take 20 mg by mouth daily.    . ranitidine (ZANTAC) 150 MG tablet Take 150 mg by mouth 2 (two) times daily as needed for heartburn.     . sucralfate (CARAFATE) 1 GM/10ML suspension take 10 milliliters  by mouth four times a day if needed 420 mL 0  . venlafaxine XR (EFFEXOR-XR) 75 MG 24 hr capsule Take 1 capsule (75 mg total) by mouth at bedtime.    . metoprolol tartrate (LOPRESSOR) 50 MG tablet Take 1 tablet (50 mg total) 2 (two) times daily by mouth. 60 tablet 6   No current facility-administered medications on file prior to visit.     BP 126/80   Pulse 80   Temp 97.8 F (36.6 C) (Oral)   Ht 5' 4.5" (1.638 m)   Wt 158 lb (71.7 kg)   SpO2 98%   BMI 26.70 kg/m    Objective:   Physical Exam  Constitutional: She appears well-nourished. She does not appear ill.  Neck: Neck supple.  Cardiovascular: Normal rate and regular rhythm.  Pulmonary/Chest: Effort normal. She has no wheezes.  Abdominal: There is no CVA tenderness.          Assessment & Plan:  Flank Pain:  Present since 1:30 am today. No CVA tenderness on exam, does not appear sickly. UA today negative. Culture sent. Already on Doxycycline for respiratory symptoms.  Could be MSK vs renal stones. Check KUB plain films today. Discussed Ibuprofen use, push intake of water. Await culture and xray results.   Pleas Koch, NP

## 2017-12-26 LAB — URINE CULTURE
MICRO NUMBER:: 90294350
Result:: NO GROWTH
SPECIMEN QUALITY: ADEQUATE

## 2017-12-31 ENCOUNTER — Telehealth: Payer: Self-pay | Admitting: Family Medicine

## 2017-12-31 DIAGNOSIS — M546 Pain in thoracic spine: Secondary | ICD-10-CM

## 2017-12-31 NOTE — Telephone Encounter (Signed)
Copied from [place] [id number]. Topic: Referral - Request >> Dec 31, 2017  1:12 PM Shelby Vazquez, [place], [place] wrote: Reason for CRM: Patient is calling because she would like to have a referral to have physical therapy for the pain in her back,If someone could give her a call back at [phone]

## 2017-12-31 NOTE — Telephone Encounter (Signed)
Please notify patient that I'm happy to help, but have a few questions: Is the pain still located to her right upper back or has it moved?  Is she feeling any worse? Any urinary symptoms (burning, blood in urine, frequency)? Fevers?

## 2017-12-31 NOTE — Telephone Encounter (Signed)
Stated that she would like the referral to be at the [facility] on [address] [place] [place]

## 2018-01-01 NOTE — Telephone Encounter (Signed)
Noted, referral placed.  

## 2018-01-01 NOTE — Telephone Encounter (Signed)
Spoken to patient.  She stated that pain still at the same location--right upper back.  No, worse, the pain is about the same.  No urinary symptoms.  No fevers.

## 2018-02-16 ENCOUNTER — Other Ambulatory Visit: Payer: Self-pay | Admitting: Family Medicine

## 2018-02-16 ENCOUNTER — Other Ambulatory Visit: Payer: Medicare Other

## 2018-02-16 DIAGNOSIS — I10 Essential (primary) hypertension: Secondary | ICD-10-CM

## 2018-02-16 DIAGNOSIS — E611 Iron deficiency: Secondary | ICD-10-CM

## 2018-02-16 DIAGNOSIS — D75839 Thrombocytosis, unspecified: Secondary | ICD-10-CM

## 2018-02-16 DIAGNOSIS — E119 Type 2 diabetes mellitus without complications: Secondary | ICD-10-CM

## 2018-02-16 DIAGNOSIS — E782 Mixed hyperlipidemia: Secondary | ICD-10-CM

## 2018-02-16 DIAGNOSIS — D473 Essential (hemorrhagic) thrombocythemia: Secondary | ICD-10-CM

## 2018-02-16 DIAGNOSIS — E538 Deficiency of other specified B group vitamins: Secondary | ICD-10-CM

## 2018-02-16 LAB — MICROALBUMIN / CREATININE URINE RATIO
CREATININE, U: 63.6 mg/dL
MICROALB/CREAT RATIO: 1.1 mg/g (ref 0.0–30.0)

## 2018-02-18 ENCOUNTER — Ambulatory Visit (INDEPENDENT_AMBULATORY_CARE_PROVIDER_SITE_OTHER): Payer: Medicare Other | Admitting: Family Medicine

## 2018-02-18 ENCOUNTER — Encounter: Payer: Self-pay | Admitting: Family Medicine

## 2018-02-18 VITALS — BP 120/82 | HR 70 | Temp 97.9°F | Ht 64.0 in | Wt 156.2 lb

## 2018-02-18 DIAGNOSIS — E611 Iron deficiency: Secondary | ICD-10-CM

## 2018-02-18 DIAGNOSIS — Z0000 Encounter for general adult medical examination without abnormal findings: Secondary | ICD-10-CM | POA: Diagnosis not present

## 2018-02-18 DIAGNOSIS — Z7189 Other specified counseling: Secondary | ICD-10-CM

## 2018-02-18 DIAGNOSIS — E119 Type 2 diabetes mellitus without complications: Secondary | ICD-10-CM

## 2018-02-18 DIAGNOSIS — K580 Irritable bowel syndrome with diarrhea: Secondary | ICD-10-CM

## 2018-02-18 DIAGNOSIS — E782 Mixed hyperlipidemia: Secondary | ICD-10-CM

## 2018-02-18 DIAGNOSIS — I700 Atherosclerosis of aorta: Secondary | ICD-10-CM

## 2018-02-18 DIAGNOSIS — E538 Deficiency of other specified B group vitamins: Secondary | ICD-10-CM

## 2018-02-18 DIAGNOSIS — F331 Major depressive disorder, recurrent, moderate: Secondary | ICD-10-CM

## 2018-02-18 DIAGNOSIS — M8580 Other specified disorders of bone density and structure, unspecified site: Secondary | ICD-10-CM

## 2018-02-18 DIAGNOSIS — I10 Essential (primary) hypertension: Secondary | ICD-10-CM

## 2018-02-18 DIAGNOSIS — R339 Retention of urine, unspecified: Secondary | ICD-10-CM

## 2018-02-18 MED ORDER — LOVASTATIN 20 MG PO TABS: 20.0000 mg | ORAL_TABLET | Freq: Every day | ORAL | 1 refills | Status: DC

## 2018-02-18 NOTE — Assessment & Plan Note (Signed)
Preventative protocols reviewed and updated unless pt declined. Discussed healthy diet and lifestyle.  

## 2018-02-18 NOTE — Assessment & Plan Note (Addendum)
She has tried low fodmap diet. Avoids high fiber foods esp salads. Continue probiotic, effexor.

## 2018-02-18 NOTE — Assessment & Plan Note (Signed)

## 2018-02-18 NOTE — Assessment & Plan Note (Signed)
Chronic, stable off medication. Continue to monitor diet.

## 2018-02-18 NOTE — Assessment & Plan Note (Signed)
Chronic, stable. Continue current regimen. 

## 2018-02-18 NOTE — Assessment & Plan Note (Signed)
Continue prenatal vit with iron

## 2018-02-18 NOTE — Patient Instructions (Addendum)
See Dr Cristina Gong next week  Trial lovastatin '20mg'$  in place of simvastatin. Update me with effect. Consider shingrix vaccine.  Work on Scientist, physiological.  Return as needed or in 1 year for next wellness visit and physical.  Health Maintenance, Female Adopting a healthy lifestyle and getting preventive care can go a long way to promote health and wellness. Talk with your health care provider about what schedule of regular examinations is right for you. This is a good chance for you to check in with your provider about disease prevention and staying healthy. In between checkups, there are plenty of things you can do on your own. Experts have done a lot of research about which lifestyle changes and preventive measures are most likely to keep you healthy. Ask your health care provider for more information. Weight and diet Eat a healthy diet  Be sure to include plenty of vegetables, fruits, low-fat dairy products, and lean protein.  Do not eat a lot of foods high in solid fats, added sugars, or salt.  Get regular exercise. This is one of the most important things you can do for your health. ? Most adults should exercise for at least 150 minutes each week. The exercise should increase your heart rate and make you sweat (moderate-intensity exercise). ? Most adults should also do strengthening exercises at least twice a week. This is in addition to the moderate-intensity exercise.  Maintain a healthy weight  Body mass index (BMI) is a measurement that can be used to identify possible weight problems. It estimates body fat based on height and weight. Your health care provider can help determine your BMI and help you achieve or maintain a healthy weight.  For females 51 years of age and older: ? A BMI below 18.5 is considered underweight. ? A BMI of 18.5 to 24.9 is normal. ? A BMI of 25 to 29.9 is considered overweight. ? A BMI of 30 and above is considered obese.  Watch levels of cholesterol and  blood lipids  You should start having your blood tested for lipids and cholesterol at 67 years of age, then have this test every 5 years.  You may need to have your cholesterol levels checked more often if: ? Your lipid or cholesterol levels are high. ? You are older than 67 years of age. ? You are at high risk for heart disease.  Cancer screening Lung Cancer  Lung cancer screening is recommended for adults 18-53 years old who are at high risk for lung cancer because of a history of smoking.  A yearly low-dose CT scan of the lungs is recommended for people who: ? Currently smoke. ? Have quit within the past 15 years. ? Have at least a 30-pack-year history of smoking. A pack year is smoking an average of one pack of cigarettes a day for 1 year.  Yearly screening should continue until it has been 15 years since you quit.  Yearly screening should stop if you develop a health problem that would prevent you from having lung cancer treatment.  Breast Cancer  Practice breast self-awareness. This means understanding how your breasts normally appear and feel.  It also means doing regular breast self-exams. Let your health care provider know about any changes, no matter how small.  If you are in your 20s or 30s, you should have a clinical breast exam (CBE) by a health care provider every 1-3 years as part of a regular health exam.  If you are 40 or older,  have a CBE every year. Also consider having a breast X-ray (mammogram) every year.  If you have a family history of breast cancer, talk to your health care provider about genetic screening.  If you are at high risk for breast cancer, talk to your health care provider about having an MRI and a mammogram every year.  Breast cancer gene (BRCA) assessment is recommended for women who have family members with BRCA-related cancers. BRCA-related cancers include: ? Breast. ? Ovarian. ? Tubal. ? Peritoneal cancers.  Results of the assessment  will determine the need for genetic counseling and BRCA1 and BRCA2 testing.  Cervical Cancer Your health care provider may recommend that you be screened regularly for cancer of the pelvic organs (ovaries, uterus, and vagina). This screening involves a pelvic examination, including checking for microscopic changes to the surface of your cervix (Pap test). You may be encouraged to have this screening done every 3 years, beginning at age 21.  For women ages 30-65, health care providers may recommend pelvic exams and Pap testing every 3 years, or they may recommend the Pap and pelvic exam, combined with testing for human papilloma virus (HPV), every 5 years. Some types of HPV increase your risk of cervical cancer. Testing for HPV may also be done on women of any age with unclear Pap test results.  Other health care providers may not recommend any screening for nonpregnant women who are considered low risk for pelvic cancer and who do not have symptoms. Ask your health care provider if a screening pelvic exam is right for you.  If you have had past treatment for cervical cancer or a condition that could lead to cancer, you need Pap tests and screening for cancer for at least 20 years after your treatment. If Pap tests have been discontinued, your risk factors (such as having a new sexual partner) need to be reassessed to determine if screening should resume. Some women have medical problems that increase the chance of getting cervical cancer. In these cases, your health care provider may recommend more frequent screening and Pap tests.  Colorectal Cancer  This type of cancer can be detected and often prevented.  Routine colorectal cancer screening usually begins at 67 years of age and continues through 67 years of age.  Your health care provider may recommend screening at an earlier age if you have risk factors for colon cancer.  Your health care provider may also recommend using home test kits to  check for hidden blood in the stool.  A small camera at the end of a tube can be used to examine your colon directly (sigmoidoscopy or colonoscopy). This is done to check for the earliest forms of colorectal cancer.  Routine screening usually begins at age 50.  Direct examination of the colon should be repeated every 5-10 years through 67 years of age. However, you may need to be screened more often if early forms of precancerous polyps or small growths are found.  Skin Cancer  Check your skin from head to toe regularly.  Tell your health care provider about any new moles or changes in moles, especially if there is a change in a mole's shape or color.  Also tell your health care provider if you have a mole that is larger than the size of a pencil eraser.  Always use sunscreen. Apply sunscreen liberally and repeatedly throughout the day.  Protect yourself by wearing long sleeves, pants, a wide-brimmed hat, and sunglasses whenever you are outside.    Heart disease, diabetes, and high blood pressure  High blood pressure causes heart disease and increases the risk of stroke. High blood pressure is more likely to develop in: ? People who have blood pressure in the high end of the normal range (130-139/85-89 mm Hg). ? People who are overweight or obese. ? People who are African American.  If you are 4-70 years of age, have your blood pressure checked every 3-5 years. If you are 38 years of age or older, have your blood pressure checked every year. You should have your blood pressure measured twice-once when you are at a hospital or clinic, and once when you are not at a hospital or clinic. Record the average of the two measurements. To check your blood pressure when you are not at a hospital or clinic, you can use: ? An automated blood pressure machine at a pharmacy. ? A home blood pressure monitor.  If you are between 34 years and 35 years old, ask your health care provider if you should  take aspirin to prevent strokes.  Have regular diabetes screenings. This involves taking a blood sample to check your fasting blood sugar level. ? If you are at a normal weight and have a low risk for diabetes, have this test once every three years after 67 years of age. ? If you are overweight and have a high risk for diabetes, consider being tested at a younger age or more often. Preventing infection Hepatitis B  If you have a higher risk for hepatitis B, you should be screened for this virus. You are considered at high risk for hepatitis B if: ? You were born in a country where hepatitis B is common. Ask your health care provider which countries are considered high risk. ? Your parents were born in a high-risk country, and you have not been immunized against hepatitis B (hepatitis B vaccine). ? You have HIV or AIDS. ? You use needles to inject street drugs. ? You live with someone who has hepatitis B. ? You have had sex with someone who has hepatitis B. ? You get hemodialysis treatment. ? You take certain medicines for conditions, including cancer, organ transplantation, and autoimmune conditions.  Hepatitis C  Blood testing is recommended for: ? Everyone born from [birth year] through 1965. ? Anyone with known risk factors for hepatitis C.  Sexually transmitted infections (STIs)  You should be screened for sexually transmitted infections (STIs) including gonorrhea and chlamydia if: ? You are sexually active and are younger than 67 years of age. ? You are older than 67 years of age and your health care provider tells you that you are at risk for this type of infection. ? Your sexual activity has changed since you were last screened and you are at an increased risk for chlamydia or gonorrhea. Ask your health care provider if you are at risk.  If you do not have HIV, but are at risk, it may be recommended that you take a prescription medicine daily to prevent HIV infection. This is called  pre-exposure prophylaxis (PrEP). You are considered at risk if: ? You are sexually active and do not regularly use condoms or know the HIV status of your partner(s). ? You take drugs by injection. ? You are sexually active with a partner who has HIV.  Talk with your health care provider about whether you are at high risk of being infected with HIV. If you choose to begin PrEP, you should first be tested for HIV.  You should then be tested every 3 months for as long as you are taking PrEP. Pregnancy  If you are premenopausal and you may become pregnant, ask your health care provider about preconception counseling.  If you may become pregnant, take 400 to 800 micrograms (mcg) of folic acid every day.  If you want to prevent pregnancy, talk to your health care provider about birth control (contraception). Osteoporosis and menopause  Osteoporosis is a disease in which the bones lose minerals and strength with aging. This can result in serious bone fractures. Your risk for osteoporosis can be identified using a bone density scan.  If you are 65 years of age or older, or if you are at risk for osteoporosis and fractures, ask your health care provider if you should be screened.  Ask your health care provider whether you should take a calcium or vitamin D supplement to lower your risk for osteoporosis.  Menopause may have certain physical symptoms and risks.  Hormone replacement therapy may reduce some of these symptoms and risks. Talk to your health care provider about whether hormone replacement therapy is right for you. Follow these instructions at home:  Schedule regular health, dental, and eye exams.  Stay current with your immunizations.  Do not use any tobacco products including cigarettes, chewing tobacco, or electronic cigarettes.  If you are pregnant, do not drink alcohol.  If you are breastfeeding, limit how much and how often you drink alcohol.  Limit alcohol intake to no more  than 1 drink per day for nonpregnant women. One drink equals 12 ounces of beer, 5 ounces of wine, or 1 ounces of hard liquor.  Do not use street drugs.  Do not share needles.  Ask your health care provider for help if you need support or information about quitting drugs.  Tell your health care provider if you often feel depressed.  Tell your health care provider if you have ever been abused or do not feel safe at home. This information is not intended to replace advice given to you by your health care provider. Make sure you discuss any questions you have with your health care provider. Document Released: 04/22/2011 Document Revised: 03/14/2016 Document Reviewed: 07/11/2015 Elsevier Interactive Patient Education  2018 [employer]  

## 2018-02-18 NOTE — Progress Notes (Signed)
BP 120/82 (BP Location: Left Arm, Patient Position: Sitting, Cuff Size: Normal)   Pulse 70   Temp 97.9 F (36.6 C) (Oral)   Ht 5\' 4"  (1.626 m)   Wt 156 lb 4 oz (70.9 kg)   SpO2 97%   BMI 26.82 kg/m    CC: medicare wellness visit/CPE Subjective:    Patient ID: Shelby Vazquez, female    DOB: 20-Jan-1951, 67 y.o.   MRN: [id number]  HPI: Shelby Vazquez is a 67 y.o. female presenting on 02/18/2018 for Medicare Wellness (Wants to discuss resuming chol med.)   Feeling markedly better over the last 1-2 months. Walking 9-10 miles a week! Determined to work on weight. interstim is helping bladder. Intermittent self catheterization every night. Taking methenamine 1gm BID.   Recent labwork - simvastatin holiday due to myalgias. Persistently elevated readings off med.   Planning R foot surgery at the end of 2019.    Hearing Screening   125Hz  250Hz  500Hz  1000Hz  2000Hz  3000Hz  4000Hz  6000Hz  8000Hz   Right ear:   40 40 25  20    Left ear:   40 40 40  40    Vision Screening Comments: Last eye exam, 09/2017 Falls - none Depression screen - normal   Preventative: COLONOSCOPY WITH PROPOFOL Date: 03/31/2014 sessile serrated adenomas, sigmoid diverticulosis, rpt 3 yrs Cleotis Nipper, MD) ESOPHAGOGASTRODUODENOSCOPY Date: 03/31/2014 mult gastric polyps, mild mucosal hemorrhages; Cleotis Nipper, MD Well woman with Dr. 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 sees yearly in September DEXA 01/2017 osteopenia ([place]) Mammogram 06/2017 WNL ([place]) Flu shot yearly Pneumovax 01/2013, prevnar 01/2016 Td 2011 Zostavax 04/2013 shingrix discussed - discussed Advanced directives: does not have this set up. Packet provided last visit. HCPOA would be daughter [name].  Seat belt use discussed Sunscreen use discussed, no changing moles on skin Dentist - sees regularly Non smoker Alcohol - none  Married 1973. 2 daughters '74, '80; 5 grandchildren.  No h/o physical or sexual abuse.  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  Edu: [employer] Activity: really enjoys hiking, now training for 5k Diet: good water, fruits/vegetables daily   Relevant past medical, surgical, family and social history reviewed and updated as indicated. Interim medical history since our last visit reviewed. Allergies and medications reviewed and updated. Outpatient Medications Prior to Visit  Medication Sig Dispense Refill  . acetaminophen (TYLENOL) 500 MG tablet Take 1,000 mg by mouth every 6 (six) hours as needed for moderate pain.     Marland Kitchen aspirin 81 MG tablet Take 1 tablet (81 mg total) by mouth daily. 30 tablet   . Calcium Carbonate-Vitamin D 600-200 MG-UNIT CAPS Take 1 capsule by mouth daily.    . clidinium-chlordiazePOXIDE (LIBRAX) 5-2.5 MG capsule TAKE 1 CAPSULE BY MOUTH TWO TIMES DAILY 180 capsule 0  . cyanocobalamin (,VITAMIN B-12,) 1000 MCG/ML injection INJECT INTRAMUSCULARLY 1ML  EVERY 30 DAYS (DISCARD 28  DAYS AFTER FIRST USE.) 3 mL 3  . famotidine (PEPCID) 40 MG tablet TAKE 2 TABLETS BY MOUTH AT  BEDTIME 180 tablet 3  . ibuprofen (ADVIL,MOTRIN) 200 MG tablet Take 600 mg by mouth every 6 (six) hours as needed for moderate pain.    Marland Kitchen loratadine (CLARITIN) 10 MG tablet Take 10 mg by mouth daily.     . Melatonin 10 MG TBDP Take 20 mg by mouth daily.    . methenamine (HIPREX) 1 g tablet Take 1 g by mouth 2 (two) times  daily with a meal.   1  . Multiple Vitamins-Minerals (EQ MULTIVITAMINS ADULT GUMMY PO) Take 2 tablets by mouth daily.    . Omega-3 Fatty Acids (FISH OIL) 1000 MG CAPS Take 1 capsule by mouth 2 (two) times daily.    . polyethylene glycol powder (MIRALAX) powder Take 1 Container by mouth. As needed    . Prenatal-DSS-FeCb-FeGl-FA (CITRANATAL BLOOM PO) Take 1 tablet by mouth daily.    Marland Kitchen PRESCRIPTION MEDICATION Apply topically 2 (two) times daily. Estradiol Progesterone cream 0.05-30 mg/ml    . Probiotic Product  (PROBIOTIC PO) Take 2 capsules by mouth 2 (two) times daily.     . RABEprazole (ACIPHEX) 20 MG tablet Take 20 mg by mouth daily.    . ranitidine (ZANTAC) 150 MG tablet Take 150 mg by mouth 2 (two) times daily as needed for heartburn.     . sucralfate (CARAFATE) 1 GM/10ML suspension take 10 milliliters by mouth four times a day if needed 420 mL 0  . venlafaxine XR (EFFEXOR-XR) 75 MG 24 hr capsule Take 1 capsule (75 mg total) by mouth at bedtime.    . metoprolol tartrate (LOPRESSOR) 50 MG tablet Take 1 tablet (50 mg total) 2 (two) times daily by mouth. 60 tablet 6   No facility-administered medications prior to visit.      Per HPI unless specifically indicated in ROS section below Review of Systems  Constitutional: Negative for activity change, appetite change, chills, fatigue, fever and unexpected weight change.  HENT: Negative for hearing loss.   Eyes: Negative for visual disturbance.  Respiratory: Negative for cough, chest tightness, shortness of breath and wheezing.   Cardiovascular: Negative for chest pain, palpitations and leg swelling.  Gastrointestinal: Positive for diarrhea (with vegetables/high fiber foods). Negative for abdominal distention, abdominal pain, blood in stool, constipation, nausea and vomiting.  Genitourinary: Negative for difficulty urinating and hematuria.  Musculoskeletal: Negative for arthralgias, myalgias and neck pain.  Skin: Negative for rash.  Neurological: Negative for dizziness, seizures, syncope and headaches.  Hematological: Negative for adenopathy. Does not bruise/bleed easily.  Psychiatric/Behavioral: Negative for dysphoric mood. The patient is not nervous/anxious.        Objective:    BP 120/82 (BP Location: Left Arm, Patient Position: Sitting, Cuff Size: Normal)   Pulse 70   Temp 97.9 F (36.6 C) (Oral)   Ht 5\' 4"  (1.626 m)   Wt 156 lb 4 oz (70.9 kg)   SpO2 97%   BMI 26.82 kg/m   Wt Readings from Last 3 Encounters:  02/18/18 156 lb 4 oz  (70.9 kg)  12/25/17 158 lb (71.7 kg)  12/10/17 158 lb (71.7 kg)    Physical Exam  Constitutional: She is oriented to person, place, and time. She appears well-developed and well-nourished. No distress.  HENT:  Head: Normocephalic and atraumatic.  Right Ear: Hearing, tympanic membrane, external ear and ear canal normal.  Left Ear: Hearing, tympanic membrane, external ear and ear canal normal.  Nose: Nose normal.  Mouth/Throat: Uvula is midline, oropharynx is clear and moist and mucous membranes are normal. No oropharyngeal exudate, posterior oropharyngeal edema or posterior oropharyngeal erythema.  Eyes: Pupils are equal, round, and reactive to light. Conjunctivae and EOM are normal. No scleral icterus.  Neck: Normal range of motion. Neck supple. Carotid bruit is not present. No thyromegaly present.  Cardiovascular: Normal rate, regular rhythm, normal heart sounds and intact distal pulses.  No murmur heard. Pulses:      Radial pulses are 2+ on the right side,  and 2+ on the left side.  Pulmonary/Chest: Effort normal and breath sounds normal. No respiratory distress. She has no wheezes. She has no rales.  Abdominal: Soft. Bowel sounds are normal. She exhibits no distension and no mass. There is no tenderness. There is no rebound and no guarding.  Musculoskeletal: Normal range of motion. She exhibits no edema.  Lymphadenopathy:    She has no cervical adenopathy.  Neurological: She is alert and oriented to person, place, and time.  CN grossly intact, station and gait intact Recall 3/3  Calculation 5/5 serial 7s  Skin: Skin is warm and dry. No rash noted.  Psychiatric: She has a normal mood and affect. Her behavior is normal. Judgment and thought content normal.  Nursing note and vitals reviewed.  Results for orders placed or performed in visit on 02/18/18  HM MAMMOGRAPHY  Result Value Ref Range   HM Mammogram Self Reported Normal 0-4 Bi-Rad, Self Reported Normal   Lab Results    Component Value Date   VITAMINB12 1,072 (H) 07/31/2017   Lab Results  Component Value Date   HGBA1C 6.5 12/25/2017       Assessment & Plan:   Problem List Items Addressed This Visit    Abdominal aortic atherosclerosis ([place]) (Chronic)    Restart low potency statin.       Relevant Medications   lovastatin (MEVACOR) 20 MG tablet   Advanced care planning/counseling discussion    Advanced directives: does not have this set up. Packet provided last visit. HCPOA would be daughter [name].       Diet-controlled diabetes mellitus (HCC)    Chronic, stable off medication. Continue to monitor diet.       Relevant Medications   lovastatin (MEVACOR) 20 MG tablet   Essential hypertension (Chronic)    Chronic, stable. Continue current regimen.       Relevant Medications   lovastatin (MEVACOR) 20 MG tablet   HLD (hyperlipidemia) (Chronic)    Myalgias to simvastatin. Will trial lovastatin. The 10-year ASCVD risk score Mikey Bussing DC Brooke Bonito., et al., 2013) is: 12.7%   Values used to calculate the score:     Age: 35 years     Sex: Female     Is Non-Hispanic African American: No     Diabetic: Yes     Tobacco smoker: No     Systolic Blood Pressure: 250 mmHg     Is BP treated: No     HDL Cholesterol: 40 mg/dL     Total Cholesterol: 201 mg/dL       Relevant Medications   lovastatin (MEVACOR) 20 MG tablet   IBS    She has tried low fodmap diet. Avoids high fiber foods esp salads. Continue probiotic, effexor.       Relevant Medications   polyethylene glycol powder (MIRALAX) powder   Iron deficiency    Continue prenatal vit with iron       MDD (major depressive disorder), recurrent episode, moderate (HCC)    Stable period - continue low dose effexor.       Medicare annual wellness visit, subsequent - Primary    I have personally reviewed the Medicare Annual Wellness questionnaire and have noted 1. The patient's medical and social history 2. Their use of alcohol, tobacco or  illicit drugs 3. Their current medications and supplements 4. The patient's functional ability including ADL's, fall risks, home safety risks and hearing or visual impairment. Cognitive function has been assessed and addressed as indicated.  5. Diet and  physical activity 6. Evidence for depression or mood disorders The patients weight, height, BMI have been recorded in the chart. I have made referrals, counseling and provided education to the patient based on review of the above and I have provided the pt with a written personalized care plan for preventive services. Provider list updated.. See scanned questionairre as needed for further documentation. Reviewed preventative protocols and updated unless pt declined.       Osteopenia    Continue cal/vit D, regular weight bearing exercise. Followed by OBGYN      Routine health maintenance    Preventative protocols reviewed and updated unless pt declined. Discussed healthy diet and lifestyle.       Urinary retention with incomplete bladder emptying    Doing well with InterStim and nightly self catheterization H/o complicated hospitalization with urosepsis 06/2017      Vitamin B12 deficiency    Continues b12 shot every few months.           Meds ordered this encounter  Medications  . lovastatin (MEVACOR) 20 MG tablet    Sig: Take 1 tablet (20 mg total) by mouth at bedtime.    Dispense:  90 tablet    Refill:  1   Orders Placed This Encounter  Procedures  . HM MAMMOGRAPHY    This external order was created through the Results Console.    Follow up plan: Return in about 1 year (around 02/19/2019) for annual exam, prior fasting for blood work, medicare wellness visit.  Ria Bush, MD

## 2018-02-18 NOTE — Assessment & Plan Note (Signed)
Restart low potency statin.

## 2018-02-18 NOTE — Assessment & Plan Note (Signed)
Stable period - continue low dose effexor.

## 2018-02-18 NOTE — Assessment & Plan Note (Signed)
Myalgias to simvastatin. Will trial lovastatin. The 10-year ASCVD risk score Mikey Bussing DC Brooke Bonito., et al., 2013) is: 12.7%   Values used to calculate the score:     Age: 67 years     Sex: Female     Is Non-Hispanic African American: No     Diabetic: Yes     Tobacco smoker: No     Systolic Blood Pressure: 829 mmHg     Is BP treated: No     HDL Cholesterol: 40 mg/dL     Total Cholesterol: 201 mg/dL

## 2018-02-18 NOTE — Assessment & Plan Note (Signed)
Continue cal/vit D, regular weight bearing exercise. Followed by Harle Battiest

## 2018-02-18 NOTE — Assessment & Plan Note (Signed)
Continues b12 shot every few months.

## 2018-02-18 NOTE — Assessment & Plan Note (Signed)
Advanced directives: does not have this set up. Packet provided last visit. HCPOA would be daughter Shelby Vazquez.

## 2018-02-18 NOTE — Assessment & Plan Note (Signed)
Doing well with InterStim and nightly self catheterization H/o complicated hospitalization with urosepsis 06/2017

## 2018-03-06 ENCOUNTER — Other Ambulatory Visit: Payer: Self-pay | Admitting: Cardiology

## 2018-03-06 NOTE — Telephone Encounter (Signed)
Rx request sent to pharmacy.  

## 2018-03-12 ENCOUNTER — Encounter: Payer: Self-pay | Admitting: Emergency Medicine

## 2018-03-12 ENCOUNTER — Emergency Department
Admission: EM | Admit: 2018-03-12 | Discharge: 2018-03-12 | Disposition: A | Discharge status: Home / Self Care | Payer: Medicare Other | Attending: Emergency Medicine | Admitting: Emergency Medicine

## 2018-03-12 ENCOUNTER — Emergency Department: Payer: Medicare Other

## 2018-03-12 ENCOUNTER — Ambulatory Visit: Payer: Self-pay | Admitting: *Deleted

## 2018-03-12 ENCOUNTER — Ambulatory Visit: Payer: Medicare Other | Admitting: Family

## 2018-03-12 ENCOUNTER — Other Ambulatory Visit: Payer: Self-pay

## 2018-03-12 DIAGNOSIS — Y9389 Activity, other specified: Secondary | ICD-10-CM | POA: Diagnosis not present

## 2018-03-12 DIAGNOSIS — W108XXA Fall (on) (from) other stairs and steps, initial encounter: Secondary | ICD-10-CM | POA: Insufficient documentation

## 2018-03-12 DIAGNOSIS — E119 Type 2 diabetes mellitus without complications: Secondary | ICD-10-CM | POA: Diagnosis not present

## 2018-03-12 DIAGNOSIS — I10 Essential (primary) hypertension: Secondary | ICD-10-CM | POA: Insufficient documentation

## 2018-03-12 DIAGNOSIS — S3992XA Unspecified injury of lower back, initial encounter: Secondary | ICD-10-CM | POA: Diagnosis present

## 2018-03-12 DIAGNOSIS — Z79899 Other long term (current) drug therapy: Secondary | ICD-10-CM | POA: Diagnosis not present

## 2018-03-12 DIAGNOSIS — Y92018 Other place in single-family (private) house as the place of occurrence of the external cause: Secondary | ICD-10-CM | POA: Diagnosis not present

## 2018-03-12 DIAGNOSIS — S300XXA Contusion of lower back and pelvis, initial encounter: Secondary | ICD-10-CM | POA: Diagnosis not present

## 2018-03-12 DIAGNOSIS — I2510 Atherosclerotic heart disease of native coronary artery without angina pectoris: Secondary | ICD-10-CM | POA: Insufficient documentation

## 2018-03-12 DIAGNOSIS — Z7982 Long term (current) use of aspirin: Secondary | ICD-10-CM | POA: Diagnosis not present

## 2018-03-12 DIAGNOSIS — Y999 Unspecified external cause status: Secondary | ICD-10-CM | POA: Insufficient documentation

## 2018-03-12 MED ORDER — ONDANSETRON HCL 4 MG/2ML IJ SOLN
INTRAMUSCULAR | Status: AC
  Administered 2018-03-12: 4 mg
  Filled 2018-03-12: qty 2

## 2018-03-12 MED ORDER — ONDANSETRON 4 MG PO TBDP
4.0000 mg | ORAL_TABLET | Freq: Once | ORAL | Status: DC
  Filled 2018-03-12: qty 1

## 2018-03-12 MED ORDER — MORPHINE SULFATE (PF) 4 MG/ML IV SOLN
4.0000 mg | Freq: Once | INTRAVENOUS | Status: AC
  Administered 2018-03-12: 4 mg via INTRAVENOUS
  Filled 2018-03-12: qty 1

## 2018-03-12 MED ORDER — MELOXICAM 15 MG PO TABS: 15.0000 mg | ORAL_TABLET | Freq: Every day | ORAL | 0 refills | Status: DC

## 2018-03-12 MED ORDER — CYCLOBENZAPRINE HCL 5 MG PO TABS: 5.0000 mg | ORAL_TABLET | Freq: Three times a day (TID) | ORAL | 0 refills | Status: DC | PRN

## 2018-03-12 MED ORDER — ONDANSETRON HCL 4 MG/2ML IJ SOLN
4.0000 mg | Freq: Once | INTRAMUSCULAR | Status: AC
  Administered 2018-03-12: 4 mg via INTRAVENOUS

## 2018-03-12 MED ORDER — OXYCODONE HCL 5 MG PO TABS: 5.0000 mg | ORAL_TABLET | Freq: Three times a day (TID) | ORAL | 0 refills | Status: DC | PRN

## 2018-03-12 NOTE — Telephone Encounter (Signed)
See below

## 2018-03-12 NOTE — ED Notes (Signed)
PT VSS at departure, NAD . SPouse signed esignature for pt due to meds given and pt slightly drowsy. PT A&OX4, in wheelchair to car

## 2018-03-12 NOTE — Telephone Encounter (Signed)
Pt had an appt here today at 3:40pm. Pt cancelled & states that she is going to the ER.

## 2018-03-12 NOTE — Telephone Encounter (Signed)
Pt slipped down 3 steps and landed on her bottom but the pain is upper part of her bottom. She also hit her right side and right arm. No bleeding or other symptoms. She applied ice to her arm but not able to reach her back.  Advised to take a pain pill for her discomfort. She has the 3 grand kids with her and will call her husband to take her to an appointment.   Appointment made for today at [place] at  [facility]. [facility] at [facility] at [facility] and [facility] at [facility] [facility]. Advised to call back if she can't make the appointment or any worsening symptoms.  Reason for Disposition . [1] Age > 38 AND [2] no history of prior similar back pain  Answer Assessment - Initial Assessment Questions 1. ONSET: "When did the pain begin?"      Today at 215pm 2. LOCATION: "Where does it hurt?" (upper, mid or lower back)     Upper part of bottom 3. SEVERITY: "How bad is the pain?"  (e.g., Scale 1-10; mild, moderate, or severe)   - MILD (1-3): doesn't interfere with normal activities    - MODERATE (4-7): interferes with normal activities or awakens from sleep    - SEVERE (8-10): excruciating pain, unable to do any normal activities      Severe now 4. PATTERN: "Is the pain constant?" (e.g., yes, no; constant, intermittent)      constant 5. RADIATION: "Does the pain shoot into your legs or elsewhere?"     no 6. CAUSE:  "What do you think is causing the back pain?"      Slipped down 3 stairs 7. BACK OVERUSE:  "Any recent lifting of heavy objects, strenuous work or exercise?"     no 8. MEDICATIONS: "What have you taken so far for the pain?" (e.g., nothing, acetaminophen, NSAIDS)     no 9. NEUROLOGIC SYMPTOMS: "Do you have any weakness, numbness, or problems with bowel/bladder control?"     Have not gone to bathroom 10. OTHER SYMPTOMS: "Do you have any other symptoms?" (e.g., fever, abdominal pain, burning with urination, blood in urine)       no 11. PREGNANCY: "Is there any chance you  are pregnant?" (e.g., yes, no; LMP)       no  Protocols used: BACK PAIN-A-AH

## 2018-03-12 NOTE — ED Triage Notes (Signed)
Pt to ED via POV with c/o mechanical fall down aprox 3 steps. PT has noted swelling and brusing noted to RT buttock and sacrum. Pt denies any head injury. PT also c/o RT forearm pain and swelling . VSS

## 2018-03-12 NOTE — ED Provider Notes (Signed)
[facility] Emergency Department Provider Note ____________________________________________  Time seen: Approximately 4:18 PM  I have reviewed the triage vital signs and the nursing notes.   HISTORY  Chief Complaint Fall    HPI Shelby Vazquez is a 67 y.o. female who presents to the emergency department for evaluation and treatment of right buttock pain after a mechanical, non-syncopal fall about 2:30 this afternoon. She was carrying her sleeping grandson in her arms while walking down wooden steps. She was wearing socks and slipped. She protected the baby from hitting the ground, so her right buttock struck the edge of the step without any break in her fall. She then slid down the rest of the stairs. She managed to get up and in her car to go get the other grandchild, but has had an increasing amount of pain. She took 3 ibuprofen prior to arrival.  Past Medical History:  Diagnosis Date  . Abdominal aortic atherosclerosis ([place]) 10/2014   by CT scan  . B12 deficiency   . Chronic anxiety   . Chronic cough    cyclical cough syndrome  . Complication of anesthesia 9 yrs ago   awake and in severe pain with colonoscopy  . Concussion   . Diet-controlled diabetes mellitus ([place]) 02/22/2017   New dx 02/2017  . GERD (gastroesophageal reflux disease)    severe leading to chronic cough and hoarseness  . History of UTI   . Hyperlipidemia   . Hypertension   . IBS (irritable bowel syndrome)    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, PB-8 probiotic, healthy diet and exercise  . 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 Osteopenia 01/2015   T -0.8 spine, -1.7 hip  . Pyelonephritis due to Escherichia coli 07/16/2017  . Renal disorder    UTI  . Uterine prolapse   . Vocal cord paralysis, bilateral partial 2001   evaluated at [facility] - did not require surgery    Patient Active Problem List   Diagnosis Date Noted  . Coronary  artery disease, non-occlusive - Cor CA Score 53.  MIldCAD only. 11/02/2017  . Mild ascending aorta dilation ([place]) 11/02/2017  . Renal disorder   . Molar pregnancy with choriocarcinoma ([place])   . History of UTI   . GERD (gastroesophageal reflux disease)   . Complication of anesthesia   . Chest pain with moderate risk for cardiac etiology 09/08/2017  . Family history of premature coronary artery disease 09/08/2017  . Rapid palpitations 09/08/2017  . Thrombocytosis ([place]) 08/04/2017  . Diet-controlled diabetes mellitus ([place]) 02/22/2017  . Recurrent UTI 02/17/2017  . Urinary retention with incomplete bladder emptying 07/26/2016  . Bilateral hand pain 06/05/2016  . Advanced care planning/counseling discussion 02/12/2016  . Medicare annual wellness visit, subsequent 02/12/2016  . Left foot pain 08/16/2015  . Iron deficiency 02/06/2015  . Abdominal aortic atherosclerosis ([place]) 10/21/2014  . Lower abdominal pain 09/07/2014  . Trochanteric bursitis of left hip 02/09/2014  . Chronic cough - cyclical 46/96/2952  . Routine health maintenance 01/20/2012  . MDD (major depressive disorder), recurrent episode, moderate ([place]) 11/06/2011  . Itching 11/06/2011  . Headache 06/12/2011  . Vitamin B12 deficiency 11/19/2010  . HLD (hyperlipidemia) 11/19/2010  . GAD (generalized anxiety disorder) 11/19/2010  . Essential hypertension 11/19/2010  . GERD, SEVERE 11/19/2010  . IBS 11/19/2010  . Uterine prolapse 11/19/2010  . Osteopenia 11/19/2010  . PERSONAL HISTORY OF TROPHOBLASTIC DISEASE 11/19/2010  . Vocal cord  paralysis, bilateral partial 10/22/1999    Past Surgical History:  Procedure Laterality Date  . BUNIONECTOMY     [place] '2000  . CATARACT EXTRACTION     right 01/2010, left may 2011  . COLONOSCOPY WITH PROPOFOL N/A 03/31/2014   sessile serrated adenomas, sigmoid diverticulosis, rpt 3 yrs Cleotis Nipper, MD)  . Coronary Calcium Score/CTA  09/2017   Calcium score 53 . Normal right dominant  coronary artery system with mild onset of CAD. Aneurysmal dilation of the ascending aorta with maximum diameter diameter of 4.0-3.8 centimeter. -Recommend annual CT/MRA December 2018  . DILATION AND CURETTAGE OF UTERUS  '77   for molar pregnancy excision  . ESOPHAGOGASTRODUODENOSCOPY N/A 03/31/2014   mult gastric polyps, mild mucosal hemorrhages; ESOPHAGOGASTRODUODENOSCOPY (EGD);  Surgeon: Cleotis Nipper, MD  . ESOPHAGOGASTRODUODENOSCOPY ENDOSCOPY  april 2014  . INTERSTIM IMPLANT PLACEMENT  2018  . TRANSTHORACIC ECHOCARDIOGRAM  09/2017   Normal LV size and function. EF 55-60%. GR 1 DD. No regional wall motion abnormalities. No significant valvular abnormalities.    Prior to Admission medications   Medication Sig Start Date End Date Taking? Authorizing Provider  acetaminophen (TYLENOL) 500 MG tablet Take 1,000 mg by mouth every 6 (six) hours as needed for moderate pain.     [provider]  aspirin 81 MG tablet Take 1 tablet (81 mg total) by mouth daily. 08/18/17   Ria Bush, MD  Calcium Carbonate-Vitamin D 600-200 MG-UNIT CAPS Take 1 capsule by mouth daily.    [provider]  clidinium-chlordiazePOXIDE (LIBRAX) 5-2.5 MG capsule TAKE 1 CAPSULE BY MOUTH TWO TIMES DAILY 11/13/17   Ria Bush, MD  cyanocobalamin (,VITAMIN B-12,) 1000 MCG/ML injection INJECT INTRAMUSCULARLY 1ML  EVERY 30 DAYS (DISCARD 28  DAYS AFTER FIRST USE.) 08/21/17   Ria Bush, MD  cyclobenzaprine (FLEXERIL) 5 MG tablet Take 1 tablet (5 mg total) by mouth 3 (three) times daily as needed for muscle spasms. 03/12/18   Sherrie George B, FNP  famotidine (PEPCID) 40 MG tablet TAKE 2 TABLETS BY MOUTH AT  BEDTIME 02/18/17   Ria Bush, MD  ibuprofen (ADVIL,MOTRIN) 200 MG tablet Take 600 mg by mouth every 6 (six) hours as needed for moderate pain.    [provider]  loratadine (CLARITIN) 10 MG tablet Take 10 mg by mouth daily.     [provider]  lovastatin (MEVACOR) 20  MG tablet Take 1 tablet (20 mg total) by mouth at bedtime. 02/18/18   Ria Bush, MD  Melatonin 10 MG TBDP Take 20 mg by mouth daily.    [provider]  meloxicam (MOBIC) 15 MG tablet Take 1 tablet (15 mg total) by mouth daily. 03/12/18   Ryver Zadrozny, Dessa Phi, FNP  methenamine (HIPREX) 1 g tablet Take 1 g by mouth 2 (two) times daily with a meal.  01/08/17   [provider]  metoprolol tartrate (LOPRESSOR) 50 MG tablet TAKE 1 TABLET BY MOUTH TWICE A DAY 03/06/18   Leonie Man, MD  Multiple Vitamins-Minerals (EQ MULTIVITAMINS ADULT GUMMY PO) Take 2 tablets by mouth daily.    [provider]  Omega-3 Fatty Acids (FISH OIL) 1000 MG CAPS Take 1 capsule by mouth 2 (two) times daily.    [provider]  oxyCODONE (ROXICODONE) 5 MG immediate release tablet Take 1 tablet (5 mg total) by mouth every 8 (eight) hours as needed. 03/12/18 03/12/19  Ellena Kamen, Johnette Abraham B, FNP  polyethylene glycol powder (MIRALAX) powder Take 1 Container by mouth. As needed  [provider]  Prenatal-DSS-FeCb-FeGl-FA (CITRANATAL BLOOM PO) Take 1 tablet by mouth daily.    [provider]  PRESCRIPTION MEDICATION Apply topically 2 (two) times daily. Estradiol Progesterone cream 0.05-30 mg/ml    [provider]  Probiotic Product (PROBIOTIC PO) Take 2 capsules by mouth 2 (two) times daily.     [provider]  RABEprazole (ACIPHEX) 20 MG tablet Take 20 mg by mouth daily.    [provider]  ranitidine (ZANTAC) 150 MG tablet Take 150 mg by mouth 2 (two) times daily as needed for heartburn.     [provider]  sucralfate (CARAFATE) 1 GM/10ML suspension take 10 milliliters by mouth four times a day if needed 12/10/17   Ria Bush, MD  venlafaxine XR (EFFEXOR-XR) 75 MG 24 hr capsule Take 1 capsule (75 mg total) by mouth at bedtime. 07/21/17   Ria Bush, MD    Allergies Augmentin [amoxicillin-pot clavulanate] and Erythromycin  Family  History  Problem Relation Age of Onset  . Cancer Father        Esophagus  . Heart disease Mother 45  . Hyperlipidemia Mother   . Hypertension Mother   . Heart attack Mother 3  . Cancer Maternal Aunt        breast  . Heart attack Maternal Grandmother   . Heart attack Maternal Grandfather   . Heart disease Paternal Grandmother   . Heart disease Paternal Grandfather   . Hyperlipidemia Brother   . Hypertension Brother   . Diabetes Neg Hx     Social History Social History   Tobacco Use  . Smoking status: Never Smoker  . Smokeless tobacco: Never Used  Substance Use Topics  . Alcohol use: Yes    Comment: twice a year  . Drug use: No    Review of Systems Constitutional: Negative for fever. Cardiovascular: Negative for chest pain. Respiratory: Negative for shortness of breath. Musculoskeletal: Positive for right side pelvic/buttock pain. Skin: Positive for hematoma over the right buttock.  Neurological: Negative for decrease in sensation.  Negative for loss of bowel or bladder control.  ____________________________________________   PHYSICAL EXAM:  VITAL SIGNS: ED Triage Vitals  Enc Vitals Group     BP 03/12/18 1607 (!) 145/81     Pulse Rate 03/12/18 1607 75     Resp 03/12/18 1607 18     Temp 03/12/18 1607 98.4 F (36.9 C)     Temp Source 03/12/18 1607 Oral     SpO2 03/12/18 1607 96 %     Weight 03/12/18 1607 150 lb (68 kg)     Height 03/12/18 1607 5' 4.5" (1.638 m)     Head Circumference --      Peak Flow --      Pain Score 03/12/18 1612 7     Pain Loc --      Pain Edu? --      Excl. in [place]? --     Constitutional: Alert and oriented. Well appearing and in no acute distress. Eyes: Conjunctivae are clear without discharge or drainage Head: Atraumatic Neck: Supple. No focal midline tenderness. Respiratory: No cough. Respirations are even and unlabored. Musculoskeletal: No focal tenderness over the length of the cervical, thoracic, and upper lumbar spine.  Diffuse tenderness is present from about L3 to the sacrum and coccyx. Neurologic: Motor and sensation of the upper and lower extremities is intact.   Skin: Large hematoma over the right buttock and sacral area on the right side.   Psychiatric: Affect  and behavior are appropriate.  ____________________________________________   LABS (all labs ordered are listed, but only abnormal results are displayed)  Labs Reviewed - No data to display ____________________________________________  RADIOLOGY  Soft tissue contusion right buttock with a large subcutaneous hematoma in the right buttock that measures 9.1 x 4.3 x 7.4 cm.  Sigmoid diverticulosis also noted incidentally.  Per radiology. ____________________________________________   PROCEDURES  Procedures  ____________________________________________   INITIAL IMPRESSION / ASSESSMENT AND PLAN / ED COURSE  BOBBI YOUNT is a 67 y.o. who presents to the emergency department for evaluation and treatment after a mechanical, non-syncopal fall.  She has a large hematoma forming on the right buttock and presacral area.  It is doubtful that x-ray is going to provide all the information necessary for proper diagnosis, therefore pelvic CT ordered initially.  CT shows no acute bony abnormality but does show a large subcutaneous hematoma in the right buttock.  While here in the department, the patient received a total of 8 mg of morphine IV with significant reduction in pain.  She was encouraged to use ice about 20 minutes/h while awake and will be given prescriptions for Flexeril and Roxicet.  She was also encouraged to take a stool softener while on the Roxicet.  She is to call and schedule follow-up appointment with her primary care provider for next week.  Signs and symptoms of concern were reviewed with the patient and her husband.  They were advised to return to the emergency department for any concerns if they are unable to schedule appointment  with primary care.  Patient was observed ambulating out of the department without assistance.  Her gait was antalgic but steady.  Medications  morphine 4 MG/ML injection 4 mg (4 mg Intravenous Given 03/12/18 1638)  ondansetron (ZOFRAN) 4 MG/2ML injection (4 mg  Given 03/12/18 1638)  ondansetron (ZOFRAN) injection 4 mg (4 mg Intravenous Given 03/12/18 1807)  morphine 4 MG/ML injection 4 mg (4 mg Intravenous Given 03/12/18 1807)    Pertinent labs & imaging results that were available during my care of the patient were reviewed by me and considered in my medical decision making (see chart for details).  _________________________________________   FINAL CLINICAL IMPRESSION(S) / ED DIAGNOSES  Final diagnoses:  Traumatic hematoma of buttock, initial encounter    ED Discharge Orders        Ordered    oxyCODONE (ROXICODONE) 5 MG immediate release tablet  Every 8 hours PRN     03/12/18 1756    meloxicam (MOBIC) 15 MG tablet  Daily     03/12/18 1756    cyclobenzaprine (FLEXERIL) 5 MG tablet  3 times daily PRN     03/12/18 1756       If controlled substance prescribed during this visit, 12 month history viewed on the [place] prior to issuing an initial prescription for Schedule II or III opiod.    Victorino Dike, FNP 03/12/18 2026    Carrie Mew, MD 03/18/18 [id number]

## 2018-03-21 HISTORY — PX: COLONOSCOPY: SHX174

## 2018-04-07 ENCOUNTER — Other Ambulatory Visit: Payer: Self-pay | Admitting: Family Medicine

## 2018-04-17 LAB — HM COLONOSCOPY

## 2018-04-23 ENCOUNTER — Other Ambulatory Visit: Payer: Self-pay | Admitting: Family Medicine

## 2018-04-24 NOTE — Telephone Encounter (Signed)
According to last office (wellness, 5/1/190, pt was told to continue probiotic and Effexor for IBS and MiraLax but I do not see any mention of Librax.  Should this be refilled or discontinued?

## 2018-05-03 ENCOUNTER — Other Ambulatory Visit: Payer: Self-pay | Admitting: Family Medicine

## 2018-05-18 ENCOUNTER — Encounter: Payer: Self-pay | Admitting: Family Medicine

## 2018-05-28 ENCOUNTER — Other Ambulatory Visit: Payer: Self-pay | Admitting: Family Medicine

## 2018-06-11 ENCOUNTER — Telehealth: Payer: Self-pay | Admitting: Family Medicine

## 2018-06-11 ENCOUNTER — Ambulatory Visit: Payer: Medicare Other | Admitting: Cardiology

## 2018-06-11 ENCOUNTER — Encounter: Payer: Self-pay | Admitting: Cardiology

## 2018-06-11 VITALS — BP 128/81 | HR 69 | Ht 64.5 in | Wt 156.8 lb

## 2018-06-11 DIAGNOSIS — I700 Atherosclerosis of aorta: Secondary | ICD-10-CM | POA: Diagnosis not present

## 2018-06-11 DIAGNOSIS — E782 Mixed hyperlipidemia: Secondary | ICD-10-CM

## 2018-06-11 DIAGNOSIS — I10 Essential (primary) hypertension: Secondary | ICD-10-CM | POA: Diagnosis not present

## 2018-06-11 DIAGNOSIS — Z01818 Encounter for other preprocedural examination: Secondary | ICD-10-CM

## 2018-06-11 DIAGNOSIS — I77810 Thoracic aortic ectasia: Secondary | ICD-10-CM | POA: Diagnosis not present

## 2018-06-11 DIAGNOSIS — I712 Thoracic aortic aneurysm, without rupture, unspecified: Secondary | ICD-10-CM

## 2018-06-11 DIAGNOSIS — I2510 Atherosclerotic heart disease of native coronary artery without angina pectoris: Secondary | ICD-10-CM

## 2018-06-11 DIAGNOSIS — R002 Palpitations: Secondary | ICD-10-CM

## 2018-06-11 NOTE — Telephone Encounter (Signed)
Copied from [place] [phone]. Topic: Inquiry >> Jun 11, 2018  9:29 AM Mylinda Latina, NT wrote: Reason for CRM: Patient called and states she would like to get a lab test done to check her Cholesterol. Please call back to schedule CB# [phone].

## 2018-06-11 NOTE — Progress Notes (Signed)
PCP: Ria Bush, MD  Clinic Note: Chief Complaint  Patient presents with  . Follow-up    6 months; No new complaints    HPI: Shelby Vazquez is a 67 y.o. female with a PMH notable for Coronary CTA showing non-obstructive CAD, who presents today for follow-up evaluation of  Fatigue, rapid heartbeat, shortness of breath with exertion, family history of CAD. * November 2018 mostly noting exertional dyspnea and fatigue with family history of CAD ->  evaluated with an echocardiogram and coronary CTA (reviewed below) - non-obstructive CAD. --Started on metoprolol 25 twice daily as well as aspirin 81 mg daily.    Shelby Vazquez was last seen back in January of this year.  She is doing quite well, notably feeling better over the previous 2 weeks.  Back to her normal self.  She had one episode of chest pain in the preceding 2 weeks but was otherwise back to her normal self.  Was able to do a [place] around the neighborhood without discomfort.  Recent Hospitalizations:   none  Studies Personally Reviewed - (if available, images/films reviewed: From Epic Chart or Care Everywhere)  None  Interval History: Shelby Vazquez presents here today overall feeling fine.  She says that one episode in April she just "did not feel well" she felt unusual all over.  It lasted about half an hour.  She says her heart rate and blood pressure went up.  Her blood pressure 160/98.  She felt clammy and hot, but once the symptoms resolved, she has not had any further episodes. She otherwise denies any recurrent chest tightness or pressure rest or exertion.  No PND, orthopnea or edema.  She does have some palpitations but no rapid or irregular heartbeats suggesting arrhythmia. No syncope/near syncope or TIA shows amaurosis fugax. Is becoming more active and not really noticing any exertional dyspnea symptoms. No melena, hematochezia, hematuria or epistaxis.  No claudication.   ROS: A comprehensive was  performed.  Pertinent symptoms noted in HPI Review of Systems  Constitutional: Negative for malaise/fatigue.  HENT: Negative for congestion and nosebleeds.   Respiratory: Negative for cough and wheezing.   Gastrointestinal: Negative for abdominal pain, blood in stool, melena and nausea (Not since illness).  Genitourinary: Negative for hematuria.  Musculoskeletal: Negative for joint pain.  Neurological: Negative for focal weakness and seizures.  Psychiatric/Behavioral: Negative for depression and memory loss. The patient is not nervous/anxious and does not have insomnia.   All other systems reviewed and are negative.  I have reviewed and (if needed) personally updated the patient's problem list, medications, allergies, past medical and surgical history, social and family history.   Past Medical History:  Diagnosis Date  . Abdominal aortic atherosclerosis ([place]) 10/2014   by CT scan  . B12 deficiency   . Chronic anxiety   . Chronic cough    cyclical cough syndrome  . Complication of anesthesia 9 yrs ago   awake and in severe pain with colonoscopy  . Concussion   . Diet-controlled diabetes mellitus ([place]) 02/22/2017   New dx 02/2017  . GERD (gastroesophageal reflux disease)    severe leading to chronic cough and hoarseness  . History of UTI   . Hyperlipidemia   . Hypertension   . IBS (irritable bowel syndrome)    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, PB-8 probiotic, healthy diet and exercise  . 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 Osteopenia 01/2015  T -0.8 spine, -1.7 hip  . Pyelonephritis due to Escherichia coli 07/16/2017  . Renal disorder    UTI  . Uterine prolapse   . Vocal cord paralysis, bilateral partial 2001   evaluated at [facility] - did not require surgery    Past Surgical History:  Procedure Laterality Date  . BUNIONECTOMY     [place] '2000  . CATARACT EXTRACTION     right 01/2010, left may 2011  . COLONOSCOPY   03/2018   diverticulosis, rpt 5 yrs ([name])  . COLONOSCOPY WITH PROPOFOL N/A 03/31/2014   sessile serrated adenomas, sigmoid diverticulosis, rpt 3 yrs Cleotis Nipper, MD)  . Coronary Calcium Score/CTA  09/2017   Calcium score 53 . Normal right dominant coronary artery system with mild onset of CAD. Aneurysmal dilation of the ascending aorta with maximum diameter diameter of 4.0-3.8 centimeter. -Recommend annual CT/MRA December 2018  . DILATION AND CURETTAGE OF UTERUS  '77   for molar pregnancy excision  . ESOPHAGOGASTRODUODENOSCOPY N/A 03/31/2014   mult gastric polyps, mild mucosal hemorrhages; ESOPHAGOGASTRODUODENOSCOPY (EGD);  Surgeon: Cleotis Nipper, MD  . ESOPHAGOGASTRODUODENOSCOPY ENDOSCOPY  april 2014  . INTERSTIM IMPLANT PLACEMENT  2018  . TRANSTHORACIC ECHOCARDIOGRAM  09/2017   Normal LV size and function. EF 55-60%. GR 1 DD. No regional wall motion abnormalities. No significant valvular abnormalities.    Current Meds  Medication Sig  . acetaminophen (TYLENOL) 500 MG tablet Take 1,000 mg by mouth every 6 (six) hours as needed for moderate pain.   Marland Kitchen aspirin 81 MG tablet Take 1 tablet (81 mg total) by mouth daily.  . Calcium Carbonate-Vitamin D 600-200 MG-UNIT CAPS Take 1 capsule by mouth daily.  . clidinium-chlordiazePOXIDE (LIBRAX) 5-2.5 MG capsule TAKE 1 CAPSULE BY MOUTH TWO TIMES DAILY  . cyanocobalamin (,VITAMIN B-12,) 1000 MCG/ML injection INJECT INTRAMUSCULARLY 1ML  EVERY 30 DAYS (DISCARD 28  DAYS AFTER FIRST USE.)  . famotidine (PEPCID) 40 MG tablet TAKE 2 TABLETS BY MOUTH AT  BEDTIME  . ibuprofen (ADVIL,MOTRIN) 200 MG tablet Take 600 mg by mouth every 6 (six) hours as needed for moderate pain.  Marland Kitchen loratadine (CLARITIN) 10 MG tablet Take 10 mg by mouth daily.   Marland Kitchen lovastatin (MEVACOR) 20 MG tablet TAKE 1 TABLET BY MOUTH AT  BEDTIME  . Melatonin 10 MG TBDP Take 20 mg by mouth daily.  . methenamine (HIPREX) 1 g tablet Take 1 g by mouth 2 (two) times daily with a meal.   .  metoprolol tartrate (LOPRESSOR) 50 MG tablet TAKE 1 TABLET BY MOUTH TWICE A DAY  . Multiple Vitamins-Minerals (EQ MULTIVITAMINS ADULT GUMMY PO) Take 2 tablets by mouth daily.  . Omega-3 Fatty Acids (FISH OIL) 1000 MG CAPS Take 1 capsule by mouth 2 (two) times daily.  . polyethylene glycol powder (MIRALAX) powder Take 1 Container by mouth. As needed  . PRESCRIPTION MEDICATION Apply topically 2 (two) times daily. Estradiol Progesterone cream 0.05-30 mg/ml  . Probiotic Product (PROBIOTIC PO) Take 2 capsules by mouth 2 (two) times daily.   . RABEprazole (ACIPHEX) 20 MG tablet Take 20 mg by mouth daily.  . ranitidine (ZANTAC) 150 MG tablet Take 150 mg by mouth 2 (two) times daily as needed for heartburn.   . sucralfate (CARAFATE) 1 GM/10ML suspension take 10 milliliters by mouth four times a day if needed  . venlafaxine XR (EFFEXOR-XR) 75 MG 24 hr capsule Take 1 capsule (75 mg total) by mouth daily with breakfast.  . [DISCONTINUED] cyclobenzaprine (FLEXERIL) 5 MG tablet  Take 1 tablet (5 mg total) by mouth 3 (three) times daily as needed for muscle spasms.  . [DISCONTINUED] meloxicam (MOBIC) 15 MG tablet Take 1 tablet (15 mg total) by mouth daily.  . [DISCONTINUED] oxyCODONE (ROXICODONE) 5 MG immediate release tablet Take 1 tablet (5 mg total) by mouth every 8 (eight) hours as needed.  . [DISCONTINUED] Prenatal-DSS-FeCb-FeGl-FA (CITRANATAL BLOOM PO) Take 1 tablet by mouth daily.    Allergies  Allergen Reactions  . Augmentin [Amoxicillin-Pot Clavulanate] Other (See Comments)    Severe diarrhea, rash, worsened acid reflux  . Erythromycin Rash   Social History   Tobacco Use  . Smoking status: Never Smoker  . Smokeless tobacco: Never Used  Substance Use Topics  . Alcohol use: Yes    Comment: twice a year  . Drug use: No   Social History   Social History Narrative   Married 1973. 2 daughters '74, '80; 5 grandchildren.    No h/o physical or sexual abuse. 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    Edu: [employer].    Activity: stays active.   Diet: good water, fruits/vegetables daily    family history includes Cancer in her father and maternal aunt; Heart attack in her maternal grandfather and maternal grandmother; Heart attack (age of onset: 61) in her mother; Heart disease in her paternal grandfather and paternal grandmother; Heart disease (age of onset: 74) in her mother; Hyperlipidemia in her brother and mother; Hypertension in her brother and mother.  Wt Readings from Last 3 Encounters:  06/11/18 156 lb 12.8 oz (71.1 kg)  03/12/18 150 lb (68 kg)  02/18/18 156 lb 4 oz (70.9 kg)    PHYSICAL EXAM BP 128/81   Pulse 69   Ht 5' 4.5" (1.638 m)   Wt 156 lb 12.8 oz (71.1 kg)   BMI 26.50 kg/m  Physical Exam  Constitutional: She is oriented to person, place, and time. She appears well-developed and well-nourished. No distress.  Appears younger than her stated age.    HENT:  Head: Normocephalic and atraumatic.  Neck: Normal range of motion. Neck supple. No hepatojugular reflux and no JVD present. Carotid bruit is not present.  Cardiovascular: Normal rate, regular rhythm and intact distal pulses.  No extrasystoles are present. PMI is not displaced. Exam reveals gallop and S4. Exam reveals no friction rub.  No murmur heard. Pulmonary/Chest: Effort normal and breath sounds normal. No respiratory distress. She has no wheezes. She has no rales.  Abdominal: Soft. Bowel sounds are normal. She exhibits no distension. There is no tenderness. There is no rebound.  Musculoskeletal: Normal range of motion. She exhibits no edema.  Neurological: She is alert and oriented to person, place, and time.  Psychiatric: She has a normal mood and affect. Her behavior is normal. Judgment and thought content normal.  Nursing note and vitals reviewed.   Adult ECG Report Not checked   Other studies Reviewed: Additional studies/ records  that were reviewed today include:  Recent Labs:   Lab Results  Component Value Date   CREATININE 0.94 06/12/2018   BUN 19 06/12/2018   NA 136 06/12/2018   K 4.1 06/12/2018   CL 101 06/12/2018   CO2 30 06/12/2018   Lab Results  Component Value Date   CHOL 168 06/12/2018   HDL 38.60 (L) 06/12/2018   LDLCALC 97 06/12/2018   LDLDIRECT 109.0 12/25/2017   TRIG 161.0 (H) 06/12/2018   CHOLHDL 4  06/12/2018    ASSESSMENT / PLAN:  Problem List Items Addressed This Visit    Abdominal aortic atherosclerosis ([place]) (Chronic)   Relevant Orders   CT ANGIO CHEST AORTA W &/OR WO CONTRAST   Basic metabolic panel   Coronary artery disease, non-occlusive - Cor CA Score 53.  MIldCAD only. (Chronic)    Mild, nonobstructive CAD with low coronary calcium score.  Continue to press for lipid management LDL less than 100 targeting 70. Continue blood pressure control and monitor for glycemic control.      Essential hypertension (Chronic)    Blood pressure looks good on current medications.  She had one episode of hypertension back in April.  I think it be fine for her to take additional dose of metoprolol --otherwise, continue current medicines.      Relevant Orders   EKG 12-Lead (Completed)   HLD (hyperlipidemia) (Chronic)    Just had her lipids checked.  Apparently she had been on simvastatin which was stopped due to myalgias.  Does not seem to be having as much benefit while on lovastatin.  May want to consider converting to rosuvastatin. --Target LDL should be less than 100.  Difficult to determine LDL Calc versus LDL direct differences.  Would like to see some improvement on statin.  Would consider changing to rosuvastatin 20 mg daily.      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  Continue with her blood pressure control.      Relevant Orders   CT ANGIO CHEST  AORTA W &/OR WO CONTRAST   Basic metabolic panel   Rapid palpitations (Chronic)    Nothing really significant noted on monitor.  She is on metoprolol 50 twice daily.  I did recommend that if she has episodes of hypertension with fast heart rates like she had back in April and she can simply take an additional dose.      Relevant Orders   EKG 12-Lead (Completed)    Other Visit Diagnoses    Thoracic aortic aneurysm without rupture ([place])       Relevant Orders   CT ANGIO CHEST AORTA W &/OR WO CONTRAST   Basic metabolic panel   Pre-op testing       Relevant Orders   Basic metabolic panel      Current medicines are reviewed at length with the patient today. (+/- concerns) n/a The following changes have been made: n/a  Patient Instructions  NO MEDICATION CHANGES     PLEASE HAVE PRIMARY TO SEND COPY OF LABS - CHOLESTEROL WHEN COMPLETED   SCHEDULE IN DEC 2019 ( AFTER 10/07/18) AT [place]. Non-Cardiac CT Angiography (CTA)ASCENDING  AORTA , is a special type of CT scan that uses a computer to produce multi-dimensional views of major blood vessels throughout the body. In CT angiography, a contrast material is injected through an IV to help visualize the blood vessels WILL NEED TO HAVE LAB-(BMP) DONE A WEEK PRIOR TO THE TEST.     Your physician wants you to follow-up in [place].You will receive a reminder letter in the mail two months in advance. If you don't receive a letter, please call our office to schedule the follow-up appointment.   If you need a refill on your cardiac medications before your next appointment, please call your pharmacy.   Studies Ordered:   Orders Placed  This Encounter  Procedures  . CT ANGIO CHEST AORTA W &/OR WO CONTRAST  . Basic metabolic panel  . EKG 12-Lead      Glenetta Hew, M.D., M.S. Interventional Cardiologist   Pager # [phone] Phone # [phone] [address] [place], [place] [address]

## 2018-06-11 NOTE — Patient Instructions (Signed)
NO MEDICATION CHANGES     PLEASE HAVE PRIMARY TO SEND COPY OF LABS - CHOLESTEROL WHEN COMPLETED   SCHEDULE IN DEC 2019 ( AFTER 10/07/18) AT [place]. Non-Cardiac CT Angiography (CTA)ASCENDING  AORTA , is a special type of CT scan that uses a computer to produce multi-dimensional views of major blood vessels throughout the body. In CT angiography, a contrast material is injected through an IV to help visualize the blood vessels WILL NEED TO HAVE LAB-(BMP) DONE A WEEK PRIOR TO THE TEST.     Your physician wants you to follow-up in [place].You will receive a reminder letter in the mail two months in advance. If you don't receive a letter, please call our office to schedule the follow-up appointment.   If you need a refill on your cardiac medications before your next appointment, please call your pharmacy.

## 2018-06-11 NOTE — Telephone Encounter (Signed)
Pt last annual 02/18/18, no future appts scheduled. Last lipid 12/717.Please advise.

## 2018-06-11 NOTE — Telephone Encounter (Signed)
Ordered. plz schedule lab visit.  Cards wants [place] to send copy to them when done.

## 2018-06-11 NOTE — Telephone Encounter (Signed)
Spoke with pt and scheduled lab visit for tomorrow, 06/12/18 at 9:30 AM.

## 2018-06-12 ENCOUNTER — Other Ambulatory Visit (INDEPENDENT_AMBULATORY_CARE_PROVIDER_SITE_OTHER): Payer: Medicare Other

## 2018-06-12 DIAGNOSIS — E782 Mixed hyperlipidemia: Secondary | ICD-10-CM | POA: Diagnosis not present

## 2018-06-12 LAB — COMPREHENSIVE METABOLIC PANEL
ALBUMIN: 4.4 g/dL (ref 3.5–5.2)
ALT: 15 U/L (ref 0–35)
AST: 20 U/L (ref 0–37)
Alkaline Phosphatase: 45 U/L (ref 39–117)
BUN: 19 mg/dL (ref 6–23)
CALCIUM: 9.7 mg/dL (ref 8.4–10.5)
CO2: 30 meq/L (ref 19–32)
Chloride: 101 mEq/L (ref 96–112)
Creatinine, Ser: 0.94 mg/dL (ref 0.40–1.20)
GFR: 63.05 mL/min (ref >60.00)
Glucose, Bld: 129 mg/dL — ABNORMAL HIGH (ref 70–99)
POTASSIUM: 4.1 meq/L (ref 3.5–5.1)
Sodium: 136 mEq/L (ref 135–145)
Total Bilirubin: 0.5 mg/dL (ref 0.2–1.2)
Total Protein: 6.9 g/dL (ref 6.0–8.3)

## 2018-06-12 LAB — LIPID PANEL
CHOL/HDL RATIO: 4
CHOLESTEROL: 168 mg/dL (ref 0–200)
HDL: 38.6 mg/dL — ABNORMAL LOW (ref >39.00)
LDL CALC: 97 mg/dL (ref 0–99)
NonHDL: 129.3
Triglycerides: 161 mg/dL — ABNORMAL HIGH (ref 0.0–149.0)
VLDL: 32.2 mg/dL (ref 0.0–40.0)

## 2018-06-13 ENCOUNTER — Encounter: Payer: Self-pay | Admitting: Family Medicine

## 2018-06-14 ENCOUNTER — Encounter: Payer: Self-pay | Admitting: Cardiology

## 2018-06-14 NOTE — Assessment & Plan Note (Signed)
As per radiology recommendation, will check follow-up CT chest to assess any potential aortic dilation/aneurysm.  Continue with her blood pressure control.

## 2018-06-14 NOTE — Assessment & Plan Note (Signed)
Blood pressure looks good on current medications.  She had one episode of hypertension back in April.  I think it be fine for her to take additional dose of metoprolol --otherwise, continue current medicines.

## 2018-06-14 NOTE — Assessment & Plan Note (Signed)
Mild, nonobstructive CAD with low coronary calcium score.  Continue to press for lipid management LDL less than 100 targeting 70. Continue blood pressure control and monitor for glycemic control.

## 2018-06-14 NOTE — Assessment & Plan Note (Addendum)
Just had her lipids checked.  Apparently she had been on simvastatin which was stopped due to myalgias.  Does not seem to be having as much benefit while on lovastatin.  May want to consider converting to rosuvastatin. --Target LDL should be less than 100.  Difficult to determine LDL Calc versus LDL direct differences.  Would like to see some improvement on statin.  Would consider changing to rosuvastatin 20 mg daily.

## 2018-06-14 NOTE — Assessment & Plan Note (Signed)
Nothing really significant noted on monitor.  She is on metoprolol 50 twice daily.  I did recommend that if she has episodes of hypertension with fast heart rates like she had back in April and she can simply take an additional dose.

## 2018-06-18 MED ORDER — ROSUVASTATIN CALCIUM 20 MG PO TABS: 20.0000 mg | ORAL_TABLET | Freq: Every day | ORAL | 6 refills | Status: DC

## 2018-09-20 HISTORY — PX: OTHER SURGICAL HISTORY: SHX169

## 2018-09-24 ENCOUNTER — Other Ambulatory Visit: Payer: Self-pay | Admitting: Family Medicine

## 2018-09-28 ENCOUNTER — Other Ambulatory Visit: Payer: Self-pay | Admitting: *Deleted

## 2018-09-28 DIAGNOSIS — I77810 Thoracic aortic ectasia: Secondary | ICD-10-CM

## 2018-09-28 DIAGNOSIS — I700 Atherosclerosis of aorta: Secondary | ICD-10-CM

## 2018-09-28 DIAGNOSIS — I712 Thoracic aortic aneurysm, without rupture, unspecified: Secondary | ICD-10-CM

## 2018-09-28 DIAGNOSIS — Z01818 Encounter for other preprocedural examination: Secondary | ICD-10-CM

## 2018-09-28 LAB — BASIC METABOLIC PANEL
BUN / CREAT RATIO: 17 (ref 12–28)
BUN: 15 mg/dL (ref 8–27)
CALCIUM: 9 mg/dL (ref 8.7–10.3)
CHLORIDE: 98 mmol/L (ref 96–106)
CO2: 21 mmol/L (ref 20–29)
CREATININE: 0.86 mg/dL (ref 0.57–1.00)
GFR calc Af Amer: 81 mL/min/{1.73_m2} (ref >59)
GFR calc non Af Amer: 70 mL/min/{1.73_m2} (ref >59)
GLUCOSE: 94 mg/dL (ref 65–99)
Potassium: 4.2 mmol/L (ref 3.5–5.2)
Sodium: 137 mmol/L (ref 134–144)

## 2018-10-09 ENCOUNTER — Ambulatory Visit (INDEPENDENT_AMBULATORY_CARE_PROVIDER_SITE_OTHER)
Admission: RE | Admit: 2018-10-09 | Discharge: 2018-10-09 | Disposition: A | Discharge status: Home / Self Care | Payer: Medicare Other | Source: Ambulatory Visit | Attending: Cardiology | Admitting: Cardiology

## 2018-10-09 DIAGNOSIS — I712 Thoracic aortic aneurysm, without rupture, unspecified: Secondary | ICD-10-CM

## 2018-10-09 DIAGNOSIS — I77810 Thoracic aortic ectasia: Secondary | ICD-10-CM

## 2018-10-09 DIAGNOSIS — I700 Atherosclerosis of aorta: Secondary | ICD-10-CM

## 2018-10-09 MED ORDER — IOPAMIDOL (ISOVUE-370) INJECTION 76%
100.0000 mL | Freq: Once | INTRAVENOUS | Status: AC | PRN
  Administered 2018-10-09: 100 mL via INTRAVENOUS

## 2018-10-20 DIAGNOSIS — M2030 Hallux varus (acquired), unspecified foot: Secondary | ICD-10-CM | POA: Insufficient documentation

## 2018-10-20 DIAGNOSIS — M2040 Other hammer toe(s) (acquired), unspecified foot: Secondary | ICD-10-CM | POA: Insufficient documentation

## 2018-10-24 ENCOUNTER — Other Ambulatory Visit: Payer: Self-pay | Admitting: Cardiology

## 2018-11-05 IMAGING — CT CT HEAD W/O CM
3 series · 16 of 45 positions shown, 19 images · non-contrast
Comparison: 01/15/2017 head CT.

CLINICAL DATA: 66-year-old female with recent hospitalization for
kidney infection and sepsis. High fevers. Developed headache 5 days
ago prior to discharge. Persistent headaches. Dizziness today.
Initial encounter.

EXAM:
CT HEAD WITHOUT CONTRAST
TECHNIQUE: Contiguous axial images were obtained from the base of the skull
through the vertex without intravenous contrast.

[Series 2: head 5.0 h37s · axial · 0.42mm/px · z∈[+135,+250]mm · 10 of 28 slices shown, 13 images]
[im 3/28  brain]
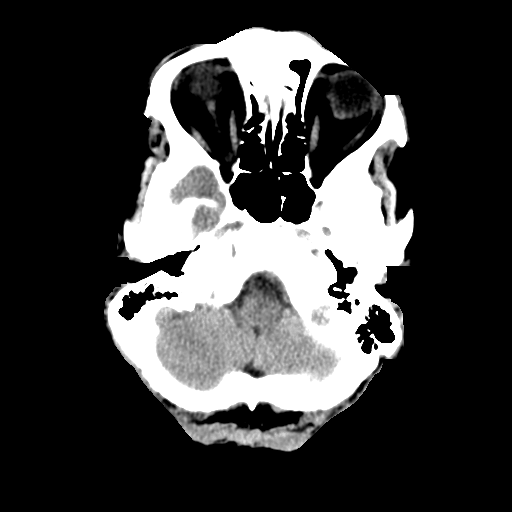
[im 3/28  bone]
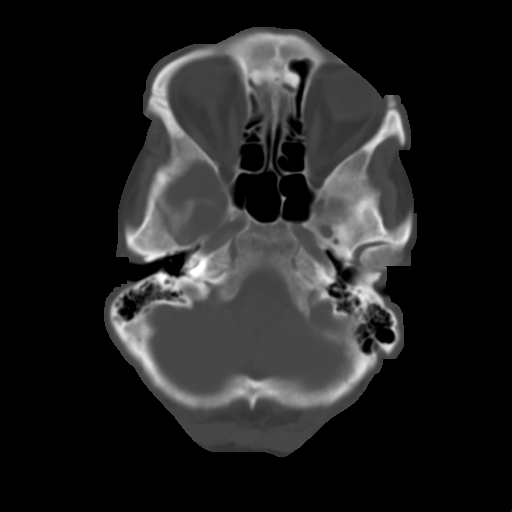
[im 5/28  brain]
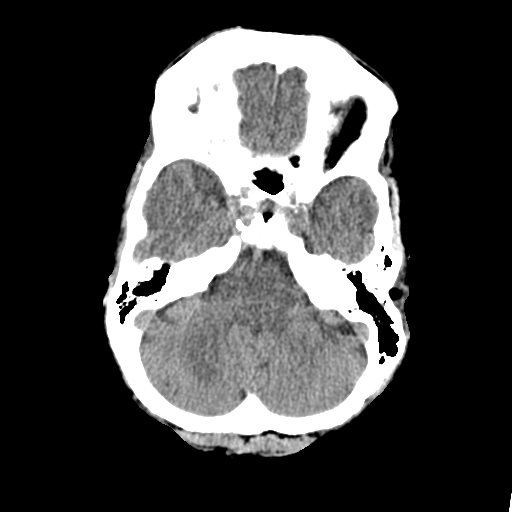
[im 8/28  brain]
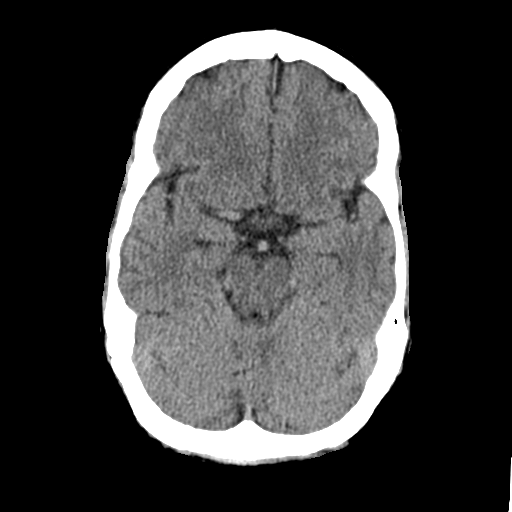
[im 11/28  brain]
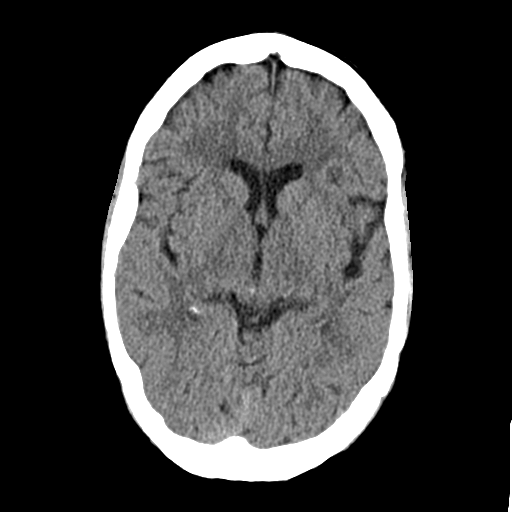
[im 13/28  brain]
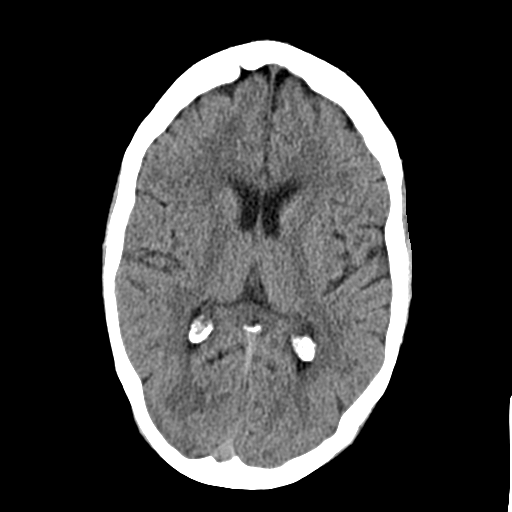
[im 13/28  bone]
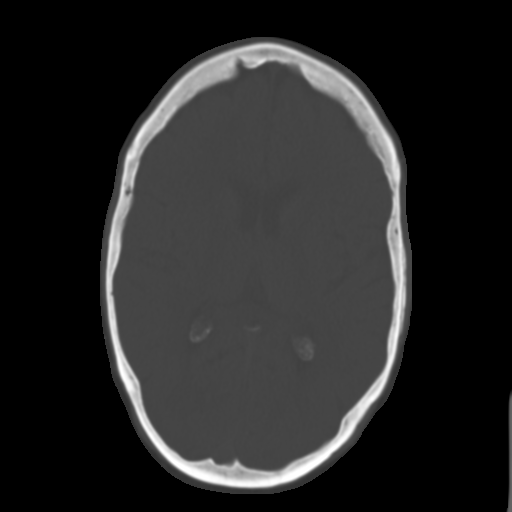
[im 16/28  brain]
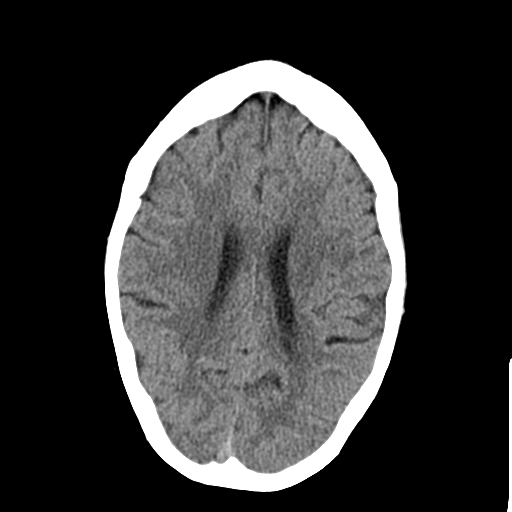
[im 18/28  brain]
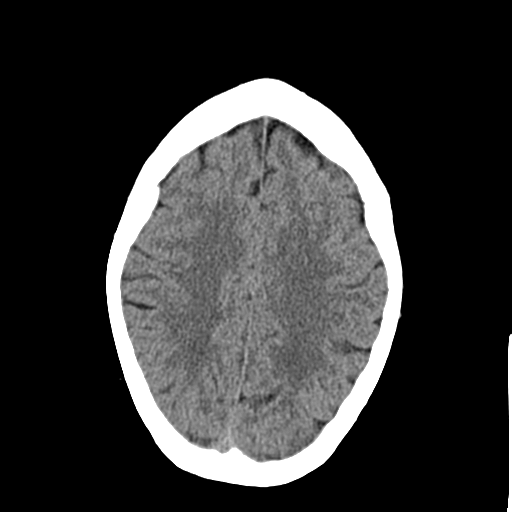
[im 21/28  brain]
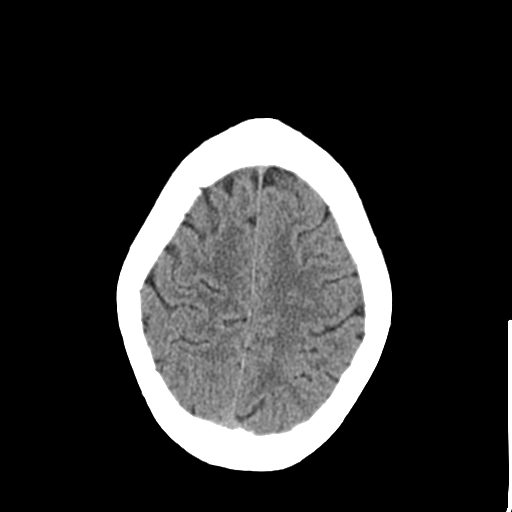
[im 24/28  brain]
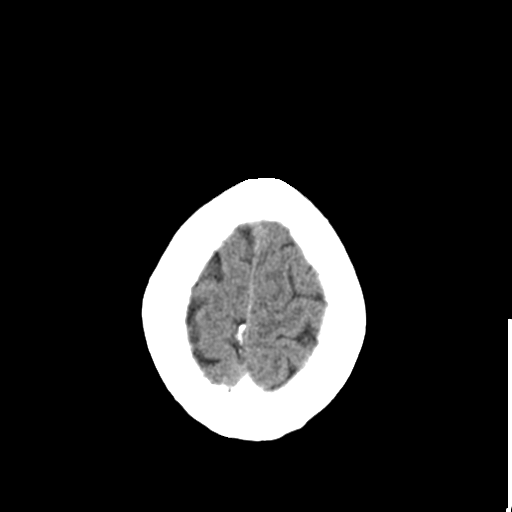
[im 24/28  bone]
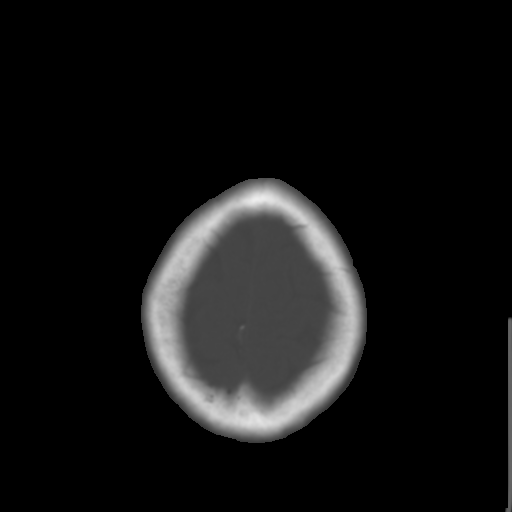
[im 26/28  brain]
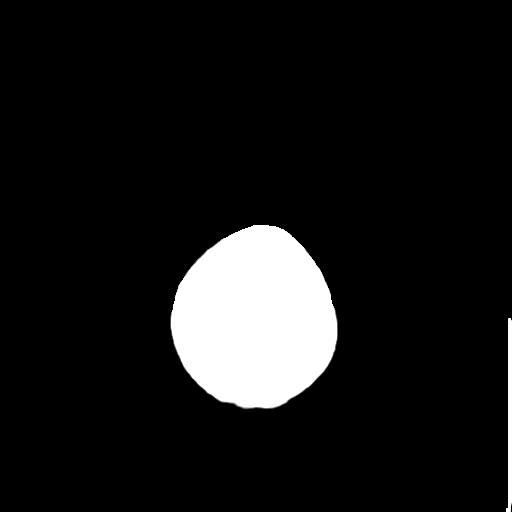

[Series 4: head 3.0 mpr cor · coronal · 0.27mm/px · 3 of 66 slices shown]
[im 22/66  brain]
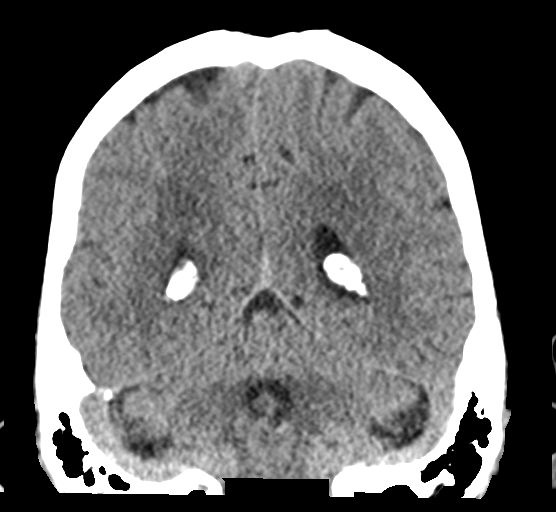
[im 29/66  brain]
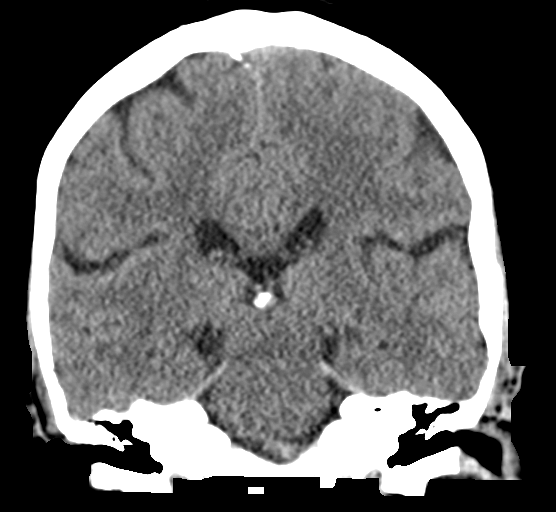
[im 37/66  brain]
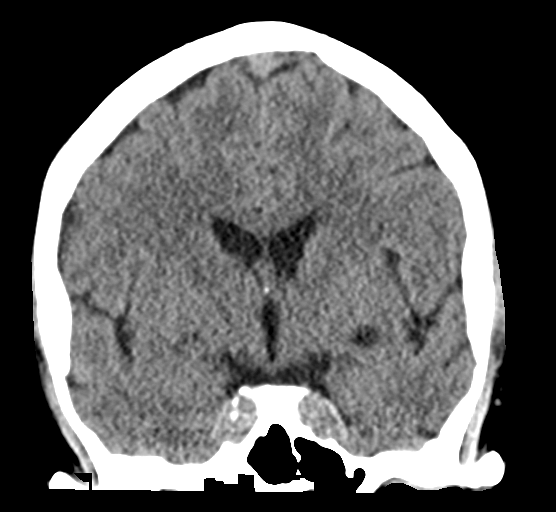

[Series 5: head 3.0 mpr sag · sagittal · 0.30mm/px · 3 of 47 slices shown]
[im 16/47  brain]
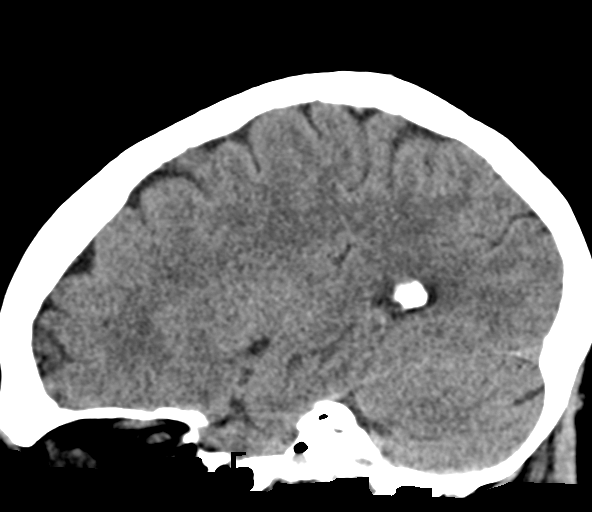
[im 24/47  brain]
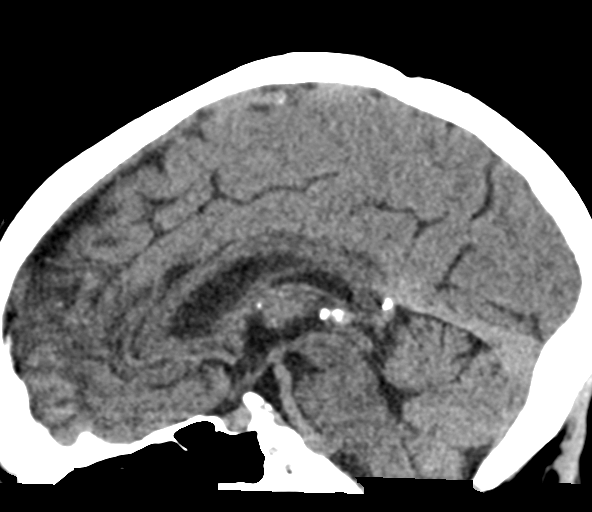
[im 31/47  brain]
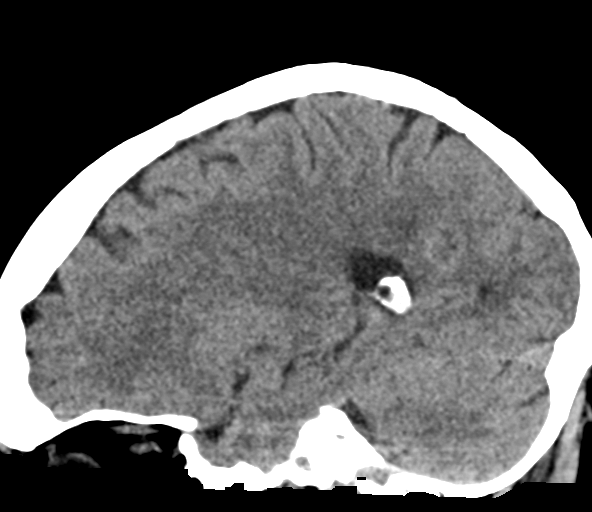

[16 of 45 positions shown; findings below may reference images not displayed]

FINDINGS: Brain: No intracranial hemorrhage or CT evidence of large acute
infarct.

Remote left lenticular nucleus infarct versus prominent peri
vascular space.

No hydrocephalus.

No intracranial mass lesion noted on this unenhanced exam.

Vascular: No hyperdense vessel

Skull: Negative.

Sinuses/Orbits: No acute orbital abnormality. Visualized paranasal
sinuses are clear.

Other: Mastoid air cells and middle ear cavities are clear.
IMPRESSION: No intracranial hemorrhage or CT evidence of large acute infarct.

## 2018-11-08 ENCOUNTER — Other Ambulatory Visit: Payer: Self-pay | Admitting: Family Medicine

## 2018-12-09 ENCOUNTER — Other Ambulatory Visit: Payer: Self-pay | Admitting: Family Medicine

## 2018-12-21 ENCOUNTER — Other Ambulatory Visit: Payer: Self-pay | Admitting: Family Medicine

## 2019-01-27 ENCOUNTER — Other Ambulatory Visit: Payer: Self-pay | Admitting: Family Medicine

## 2019-02-24 ENCOUNTER — Other Ambulatory Visit: Payer: Self-pay | Admitting: Family Medicine

## 2019-02-24 ENCOUNTER — Ambulatory Visit (INDEPENDENT_AMBULATORY_CARE_PROVIDER_SITE_OTHER): Payer: Medicare Other

## 2019-02-24 DIAGNOSIS — Z0000 Encounter for general adult medical examination without abnormal findings: Secondary | ICD-10-CM

## 2019-02-24 DIAGNOSIS — E119 Type 2 diabetes mellitus without complications: Secondary | ICD-10-CM | POA: Diagnosis not present

## 2019-02-24 DIAGNOSIS — E538 Deficiency of other specified B group vitamins: Secondary | ICD-10-CM

## 2019-02-24 DIAGNOSIS — E611 Iron deficiency: Secondary | ICD-10-CM

## 2019-02-24 DIAGNOSIS — E782 Mixed hyperlipidemia: Secondary | ICD-10-CM

## 2019-02-24 NOTE — Patient Instructions (Signed)
Shelby Vazquez , Thank you for taking time to come for your Medicare Wellness Visit. I appreciate your ongoing commitment to your health goals. Please review the following plan we discussed and let me know if I can assist you in the future.   These are the goals we discussed: Goals    . Patient Stated     Starting 02/24/19, I will continue to take medications as prescribed.        This is a list of the screening recommended for you and due dates:  Health Maintenance  Topic Date Due  . Hemoglobin A1C  02/25/2019*  . Urine Protein Check  02/25/2019*  . Flu Shot  05/22/2019  . Eye exam for diabetics  10/06/2019  . Complete foot exam   01/06/2020  . Mammogram  09/09/2020  . DTaP/Tdap/Td vaccine (2 - Td) 07/22/2025  . Tetanus Vaccine  07/22/2025  . Colon Cancer Screening  04/17/2028  . DEXA scan (bone density measurement)  Completed  .  Hepatitis C: One time screening is recommended by Center for Disease Control  (CDC) for  adults born from [birth year] through 1965.   Completed  . Pneumonia vaccines  Completed  *Topic was postponed. The date shown is not the original due date.   Preventive Care for Adults  A healthy lifestyle and preventive care can promote health and wellness. Preventive health guidelines for adults include the following key practices.  . A routine yearly physical is a good way to check with your health care provider about your health and preventive screening. It is a chance to share any concerns and updates on your health and to receive a thorough exam.  . Visit your dentist for a routine exam and preventive care every 6 months. Brush your teeth twice a day and floss once a day. Good oral hygiene prevents tooth decay and gum disease.  . The frequency of eye exams is based on your age, health, family medical history, use  of contact lenses, and other factors. Follow your health care provider's recommendations for frequency of eye exams.  . Eat a healthy diet. Foods like  vegetables, fruits, whole grains, low-fat dairy products, and lean protein foods contain the nutrients you need without too many calories. Decrease your intake of foods high in solid fats, added sugars, and salt. Eat the right amount of calories for you. Get information about a proper diet from your health care provider, if necessary.  . Regular physical exercise is one of the most important things you can do for your health. Most adults should get at least 150 minutes of moderate-intensity exercise (any activity that increases your heart rate and causes you to sweat) each week. In addition, most adults need muscle-strengthening exercises on 2 or more days a week.  Silver Sneakers may be a benefit available to you. To determine eligibility, you may visit the website: www.silversneakers.com or contact program at 901 456 3586 Mon-Fri between 8AM-8PM.   . Maintain a healthy weight. The body mass index (BMI) is a screening tool to identify possible weight problems. It provides an estimate of body fat based on height and weight. Your health care provider can find your BMI and can help you achieve or maintain a healthy weight.   For adults 20 years and older: ? A BMI below 18.5 is considered underweight. ? A BMI of 18.5 to 24.9 is normal. ? A BMI of 25 to 29.9 is considered overweight. ? A BMI of 30 and above is considered obese.   Marland Kitchen  Maintain normal blood lipids and cholesterol levels by exercising and minimizing your intake of saturated fat. Eat a balanced diet with plenty of fruit and vegetables. Blood tests for lipids and cholesterol should begin at age 10 and be repeated every 5 years. If your lipid or cholesterol levels are high, you are over 50, or you are at high risk for heart disease, you may need your cholesterol levels checked more frequently. Ongoing high lipid and cholesterol levels should be treated with medicines if diet and exercise are not working.  . If you smoke, find out from your  health care provider how to quit. If you do not use tobacco, please do not start.  . If you choose to drink alcohol, please do not consume more than 2 drinks per day. One drink is considered to be 12 ounces (355 mL) of beer, 5 ounces (148 mL) of wine, or 1.5 ounces (44 mL) of liquor.  . If you are 62-62 years old, ask your health care provider if you should take aspirin to prevent strokes.  . Use sunscreen. Apply sunscreen liberally and repeatedly throughout the day. You should seek shade when your shadow is shorter than you. Protect yourself by wearing long sleeves, pants, a wide-brimmed hat, and sunglasses year round, whenever you are outdoors.  . Once a month, do a whole body skin exam, using a mirror to look at the skin on your back. Tell your health care provider of new moles, moles that have irregular borders, moles that are larger than a pencil eraser, or moles that have changed in shape or color.

## 2019-02-24 NOTE — Progress Notes (Signed)
Subjective:   Shelby Vazquez is a 68 y.o. female who presents for Medicare Annual (Subsequent) preventive examination.  Review of Systems:  N/A Cardiac Risk Factors include: advanced age (>42men, >15 women);diabetes mellitus;dyslipidemia;hypertension     Objective:     Vitals: There were no vitals taken for this visit.  There is no height or weight on file to calculate BMI.  Advanced Directives 02/24/2019 07/16/2017 01/15/2017 03/24/2014 02/08/2014  Does Patient Have a Medical Advance Directive? No No No Patient does not have advance directive;Patient would not like information Patient does not have advance directive  Would patient like information on creating a medical advance directive? No - Patient declined No - Patient declined No - Patient declined - -    Tobacco Social History   Tobacco Use  Smoking Status Never Smoker  Smokeless Tobacco Never Used     Counseling given: No   Clinical Intake:  Pre-visit preparation completed: Yes  Pain : No/denies pain     Nutritional Status: BMI 25 -29 Overweight Nutritional Risks: None Diabetes: Yes CBG done?: No Did pt. bring in CBG monitor from home?: No  How often do you need to have someone help you when you read instructions, pamphlets, or other written materials from your doctor or pharmacy?: 1 - Never What is the last grade level you completed in school?: Bachelor degree  Interpreter Needed?: No  Comments: pt lives with spouse Information entered by :: LPinson, LPN  Past Medical History:  Diagnosis Date  . Abdominal aortic atherosclerosis ([place]) 10/2014   by CT scan  . B12 deficiency   . Chronic anxiety   . Chronic cough    cyclical cough syndrome  . Complication of anesthesia 9 yrs ago   awake and in severe pain with colonoscopy  . Concussion   . Diet-controlled diabetes mellitus ([place]) 02/22/2017   New dx 02/2017  . GERD (gastroesophageal reflux disease)    severe leading to chronic cough and hoarseness  .  History of UTI   . Hyperlipidemia   . Hypertension   . IBS (irritable bowel syndrome)    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, PB-8 probiotic, healthy diet and exercise  . 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 Osteopenia 01/2015   T -0.8 spine, -1.7 hip  . Pyelonephritis due to Escherichia coli 07/16/2017  . Renal disorder    UTI  . Uterine prolapse   . Vocal cord paralysis, bilateral partial 2001   evaluated at [facility] - did not require surgery   Past Surgical History:  Procedure Laterality Date  . BUNIONECTOMY     [place] '2000  . CATARACT EXTRACTION     right 01/2010, left may 2011  . COLONOSCOPY  03/2018   diverticulosis, rpt 5 yrs ([name])  . COLONOSCOPY WITH PROPOFOL N/A 03/31/2014   sessile serrated adenomas, sigmoid diverticulosis, rpt 3 yrs Cleotis Nipper, MD)  . Coronary Calcium Score/CTA  09/2017   Calcium score 53 . Normal right dominant coronary artery system with mild onset of CAD. Aneurysmal dilation of the ascending aorta with maximum diameter diameter of 4.0-3.8 centimeter. -Recommend annual CT/MRA December 2018  . DILATION AND CURETTAGE OF UTERUS  '77   for molar pregnancy excision  . ESOPHAGOGASTRODUODENOSCOPY N/A 03/31/2014   mult gastric polyps, mild mucosal hemorrhages; ESOPHAGOGASTRODUODENOSCOPY (EGD);  Surgeon: Cleotis Nipper, MD  . ESOPHAGOGASTRODUODENOSCOPY ENDOSCOPY  april 2014  . INTERSTIM IMPLANT PLACEMENT  2018  . TRANSTHORACIC  ECHOCARDIOGRAM  09/2017   Normal LV size and function. EF 55-60%. GR 1 DD. No regional wall motion abnormalities. No significant valvular abnormalities.   Family History  Problem Relation Age of Onset  . Cancer Father        Esophagus  . Heart disease Mother 1  . Hyperlipidemia Mother   . Hypertension Mother   . Heart attack Mother 19  . Cancer Maternal Aunt        breast  . Heart attack Maternal Grandmother   . Heart attack Maternal Grandfather   . Heart  disease Paternal Grandmother   . Heart disease Paternal Grandfather   . Hyperlipidemia Brother   . Hypertension Brother   . Diabetes Neg Hx    Social History   Socioeconomic History  . Marital status: Married    Spouse name: Not on file  . Number of children: 2  . Years of education: 75  . Highest education level: Not on file  Occupational History  . Occupation: Product manager: [employer]  Social Needs  . Financial resource strain: Not on file  . Food insecurity:    Worry: Not on file    Inability: Not on file  . Transportation needs:    Medical: Not on file    Non-medical: Not on file  Tobacco Use  . Smoking status: Never Smoker  . Smokeless tobacco: Never Used  Substance and Sexual Activity  . Alcohol use: Yes    Comment: twice a year  . Drug use: No  . Sexual activity: Yes    Partners: Male  Lifestyle  . Physical activity:    Days per week: Not on file    Minutes per session: Not on file  . Stress: Not on file  Relationships  . Social connections:    Talks on phone: Not on file    Gets together: Not on file    Attends religious service: Not on file    Active member of club or organization: Not on file    Attends meetings of clubs or organizations: Not on file    Relationship status: Not on file  Other Topics Concern  . Not on file  Social History Narrative   Married 1973. 2 daughters '74, '80; 5 grandchildren.    No h/o physical or sexual abuse. 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    Edu: [employer].    Activity: stays active.   Diet: good water, fruits/vegetables daily    Outpatient Encounter Medications as of 02/24/2019  Medication Sig  . aspirin 81 MG tablet Take 1 tablet (81 mg total) by mouth daily.  . Calcium Carbonate-Vitamin D 600-200 MG-UNIT CAPS Take 1 capsule by mouth daily.  . clidinium-chlordiazePOXIDE (LIBRAX) 5-2.5 MG capsule TAKE 1 CAPSULE BY MOUTH TWO TIMES  DAILY  . cyanocobalamin (,VITAMIN B-12,) 1000 MCG/ML injection INJECT INTRAMUSCULARLY 1ML  EVERY 30 DAYS (DISCARD 28  DAYS AFTER FIRST USE.)  . famotidine (PEPCID) 40 MG tablet TAKE 2 TABLETS BY MOUTH AT  BEDTIME  . ibuprofen (ADVIL,MOTRIN) 200 MG tablet Take 600 mg by mouth every 6 (six) hours as needed for moderate pain.  Marland Kitchen loratadine (CLARITIN) 10 MG tablet Take 10 mg by mouth daily.   Marland Kitchen lovastatin (MEVACOR) 20 MG tablet TAKE 1 TABLET BY MOUTH AT  BEDTIME  . methenamine (HIPREX) 1 g tablet Take 1 g by mouth 2 (two) times daily with a meal.   .  metoprolol tartrate (LOPRESSOR) 50 MG tablet TAKE 1 TABLET BY MOUTH TWICE DAILY  . polyethylene glycol powder (MIRALAX) powder Take 1 Container by mouth. As needed  . PRESCRIPTION MEDICATION Apply topically 2 (two) times daily. Estradiol Progesterone cream 0.05-30 mg/ml  . Probiotic Product (PROBIOTIC PO) Take 2 capsules by mouth 2 (two) times daily.   . RABEprazole (ACIPHEX) 20 MG tablet Take 20 mg by mouth daily.  . rosuvastatin (CRESTOR) 20 MG tablet TAKE 1 TABLET(20 MG) BY MOUTH DAILY  . sucralfate (CARAFATE) 1 GM/10ML suspension take 10 milliliters by mouth four times a day if needed  . venlafaxine XR (EFFEXOR-XR) 75 MG 24 hr capsule TAKE ONE CAPSULE BY MOUTH EVERY DAY WITH BREAKFAST  . [DISCONTINUED] acetaminophen (TYLENOL) 500 MG tablet Take 1,000 mg by mouth every 6 (six) hours as needed for moderate pain.   . [DISCONTINUED] Melatonin 10 MG TBDP Take 20 mg by mouth daily.  . [DISCONTINUED] Multiple Vitamins-Minerals (EQ MULTIVITAMINS ADULT GUMMY PO) Take 2 tablets by mouth daily.  . [DISCONTINUED] Omega-3 Fatty Acids (FISH OIL) 1000 MG CAPS Take 1 capsule by mouth 2 (two) times daily.  . [DISCONTINUED] ranitidine (ZANTAC) 150 MG tablet Take 150 mg by mouth 2 (two) times daily as needed for heartburn.    No facility-administered encounter medications on file as of 02/24/2019.     Activities of Daily Living In your present state of health, do  you have any difficulty performing the following activities: 02/24/2019  Hearing? Y  Vision? N  Difficulty concentrating or making decisions? Y  Walking or climbing stairs? Y  Dressing or bathing? N  Doing errands, shopping? N  Preparing Food and eating ? N  Using the Toilet? N  In the past six months, have you accidently leaked urine? N  Do you have problems with loss of bowel control? N  Managing your Medications? N  Managing your Finances? N  Housekeeping or managing your Housekeeping? N  Some recent data might be hidden    Patient Care Team: Ria Bush, MD as PCP - General (Family Medicine) Dian Queen, MD (Obstetrics and Gynecology) Elsie Stain, MD as Attending Physician (Pulmonary Disease) Ronald Lobo, MD as Referring Physician (Gastroenterology) Jodi Marble, MD as Attending Physician (Otolaryngology)    Assessment:   This is a routine wellness examination for Arwa.  Vision Screening Comments: October 05, 2018 with Dr. Gershon Crane  Exercise Activities and Dietary recommendations Current Exercise Habits: The patient has a physically strenuous job, but has no regular exercise apart from work., Exercise limited by: orthopedic condition(s)  Goals    . Patient Stated     Starting 02/24/19, I will continue to take medications as prescribed.        Fall Risk Fall Risk  02/24/2019 02/18/2018 02/17/2017 02/12/2016  Falls in the past year? 1 No Yes Yes  Comment fall with injury to buttocks; seen in ED - - -  Number falls in past yr: 0 - 2 or more 2 or more  Injury with Fall? 1 - Yes No  Risk Factor Category  - - High Fall Risk -  Risk for fall due to : - - - Other (Comment)  Risk for fall due to: Comment - - - Due to having to wear larger shoes due to foot issues  Follow up - - Falls prevention discussed -   Depression Screen PHQ 2/9 Scores 02/24/2019 02/18/2018 02/17/2017 02/12/2016  PHQ - 2 Score 0 0 2 0  PHQ- 9 Score 0 - 10 -  Cognitive Function MMSE  - Mini Mental State Exam 02/24/2019  Orientation to time 5  Orientation to Place 5  Registration 3  Attention/ Calculation 0  Recall 0  Language- name 2 objects 0  Language- repeat 1  Language- follow 3 step command 3  Language- read & follow direction 0  Write a sentence 0  Copy design 0  Total score 17       PLEASE NOTE: A Mini-Cog screen was completed. Maximum score is 17. A value of 0 denotes this part of Folstein MMSE was not completed or the patient failed this part of the Mini-Cog screening.   Mini-Cog Screening Orientation to Time - Max 5 pts Orientation to Place - Max 5 pts Registration - Max 3 pts Recall - Max 3 pts Language Repeat - Max 1 pts    Immunization History  Administered Date(s) Administered  . Influenza Split 08/21/2012  . Influenza Whole 07/25/2009, 08/29/2014  . Influenza, High Dose Seasonal PF 09/12/2017, 08/20/2018  . Influenza,inj,Quad PF,6+ Mos 07/23/2015, 07/23/2016  . Influenza,inj,Quad PF,6-35 Mos 07/24/2013  . Pneumococcal Conjugate-13 02/12/2016  . Pneumococcal Polysaccharide-23 01/20/2013, 08/20/2018  . Td 06/09/2010  . Tdap 07/23/2015  . Zoster 04/28/2013    Screening Tests Health Maintenance  Topic Date Due  . HEMOGLOBIN A1C  02/25/2019 (Originally 06/27/2018)  . URINE MICROALBUMIN  02/25/2019 (Originally 02/17/2019)  . INFLUENZA VACCINE  05/22/2019  . OPHTHALMOLOGY EXAM  10/06/2019  . FOOT EXAM  01/06/2020  . MAMMOGRAM  09/09/2020  . DTaP/Tdap/Td (2 - Td) 07/22/2025  . TETANUS/TDAP  07/22/2025  . COLONOSCOPY  04/17/2028  . DEXA SCAN  Completed  . Hepatitis C Screening  Completed  . PNA vac Low Risk Adult  Completed     Plan:     I have personally reviewed, addressed, and noted the following in the patient's chart:  A. Medical and social history B. Use of alcohol, tobacco or illicit drugs  C. Current medications and supplements D. Functional ability and status E.  Nutritional status F.  Physical activity G. Advance  directives H. List of other physicians I.  Hospitalizations, surgeries, and ER visits in previous 12 months J.  Vitals (unless it is a telemedicine encounter) K. Screenings to include cognitive, depression, hearing, vision (NOTE: hearing and vision screenings not completed in telemedicine encounter) L. Referrals and appointments   In addition, I have reviewed and discussed with patient certain preventive protocols, quality metrics, and best practice recommendations. A written personalized care plan for preventive services and recommendations were provided to patient.  With patient's permission, we connected on 02/24/19 at  9:30 AM EDT by a video enabled telemedicine application. Two patient identifiers were used to ensure the encounter occurred with the correct person.    Patient was in home and writer was in office.   Signed,   Lindell Noe, MHA, BS, LPN Health Coach

## 2019-02-24 NOTE — Progress Notes (Signed)
PCP notes:   Health maintenance:  A1C - ordered Microalbumin - ordered  Abnormal screenings:   Fall risk - hx of single fall Fall Risk  02/24/2019 02/18/2018 02/17/2017 02/12/2016  Falls in the past year? 1 No Yes Yes  Comment fall with injury to buttocks; seen in ED - - -  Number falls in past yr: 0 - 2 or more 2 or more  Injury with Fall? 1 - Yes No  Risk Factor Category  - - High Fall Risk -  Risk for fall due to : - - - Other (Comment)  Risk for fall due to: Comment - - - Due to having to wear larger shoes due to foot issues  Follow up - - Falls prevention discussed -     Patient concerns:   None  Nurse concerns:  None  Next PCP appt:   02/25/19 @ 0935

## 2019-02-25 ENCOUNTER — Other Ambulatory Visit: Payer: Self-pay

## 2019-02-25 ENCOUNTER — Other Ambulatory Visit (INDEPENDENT_AMBULATORY_CARE_PROVIDER_SITE_OTHER): Payer: Medicare Other

## 2019-02-25 DIAGNOSIS — E782 Mixed hyperlipidemia: Secondary | ICD-10-CM

## 2019-02-25 DIAGNOSIS — E611 Iron deficiency: Secondary | ICD-10-CM | POA: Diagnosis not present

## 2019-02-25 DIAGNOSIS — E538 Deficiency of other specified B group vitamins: Secondary | ICD-10-CM | POA: Diagnosis not present

## 2019-02-25 DIAGNOSIS — E119 Type 2 diabetes mellitus without complications: Secondary | ICD-10-CM

## 2019-02-25 LAB — FERRITIN: Ferritin: 12.7 ng/mL (ref 10.0–291.0)

## 2019-02-25 LAB — COMPREHENSIVE METABOLIC PANEL
ALT: 17 U/L (ref 0–35)
AST: 23 U/L (ref 0–37)
Albumin: 4.3 g/dL (ref 3.5–5.2)
Alkaline Phosphatase: 44 U/L (ref 39–117)
BUN: 15 mg/dL (ref 6–23)
CO2: 27 mEq/L (ref 19–32)
Calcium: 9.2 mg/dL (ref 8.4–10.5)
Chloride: 101 mEq/L (ref 96–112)
Creatinine, Ser: 0.93 mg/dL (ref 0.40–1.20)
GFR: 59.93 mL/min — ABNORMAL LOW (ref >60.00)
Glucose, Bld: 115 mg/dL — ABNORMAL HIGH (ref 70–99)
Potassium: 4.1 mEq/L (ref 3.5–5.1)
Sodium: 136 mEq/L (ref 135–145)
Total Bilirubin: 0.4 mg/dL (ref 0.2–1.2)
Total Protein: 6.7 g/dL (ref 6.0–8.3)

## 2019-02-25 LAB — CBC WITH DIFFERENTIAL/PLATELET
Basophils Absolute: 0 10*3/uL (ref 0.0–0.1)
Basophils Relative: 0.5 % (ref 0.0–3.0)
Eosinophils Absolute: 0.2 10*3/uL (ref 0.0–0.7)
Eosinophils Relative: 3.8 % (ref 0.0–5.0)
HCT: 40.7 % (ref 36.0–46.0)
Hemoglobin: 13.8 g/dL (ref 12.0–15.0)
Lymphocytes Relative: 43.2 % (ref 12.0–46.0)
Lymphs Abs: 2.2 10*3/uL (ref 0.7–4.0)
MCHC: 33.8 g/dL (ref 30.0–36.0)
MCV: 90.8 fl (ref 78.0–100.0)
Monocytes Absolute: 0.4 10*3/uL (ref 0.1–1.0)
Monocytes Relative: 6.7 % (ref 3.0–12.0)
Neutro Abs: 2.4 10*3/uL (ref 1.4–7.7)
Neutrophils Relative %: 45.8 % (ref 43.0–77.0)
Platelets: 289 10*3/uL (ref 150.0–400.0)
RBC: 4.48 Mil/uL (ref 3.87–5.11)
RDW: 13.3 % (ref 11.5–15.5)
WBC: 5.2 10*3/uL (ref 4.0–10.5)

## 2019-02-25 LAB — MICROALBUMIN / CREATININE URINE RATIO
Creatinine,U: 40.8 mg/dL
Microalb Creat Ratio: 1.7 mg/g (ref 0.0–30.0)
Microalb, Ur: <0.7 mg/dL (ref 0.0–1.9)

## 2019-02-25 LAB — LIPID PANEL
Cholesterol: 121 mg/dL (ref 0–200)
HDL: 40.1 mg/dL (ref >39.00)
LDL Cholesterol: 61 mg/dL (ref 0–99)
NonHDL: 81.1
Total CHOL/HDL Ratio: 3
Triglycerides: 102 mg/dL (ref 0.0–149.0)
VLDL: 20.4 mg/dL (ref 0.0–40.0)

## 2019-02-25 LAB — VITAMIN B12: Vitamin B-12: 620 pg/mL (ref 211–911)

## 2019-02-25 LAB — IBC PANEL
Iron: 96 ug/dL (ref 42–145)
Saturation Ratios: 22.6 % (ref 20.0–50.0)
Transferrin: 304 mg/dL (ref 212.0–360.0)

## 2019-02-25 LAB — HEMOGLOBIN A1C: Hgb A1c MFr Bld: 6.5 % (ref 4.6–6.5)

## 2019-02-25 NOTE — Progress Notes (Signed)
I reviewed health advisor's note, was available for consultation, and agree with documentation and plan.  

## 2019-02-26 ENCOUNTER — Ambulatory Visit (INDEPENDENT_AMBULATORY_CARE_PROVIDER_SITE_OTHER): Payer: Medicare Other | Admitting: Family Medicine

## 2019-02-26 ENCOUNTER — Encounter: Payer: Self-pay | Admitting: Family Medicine

## 2019-02-26 VITALS — BP 135/98 | HR 91 | Temp 98.1°F | Ht 64.0 in | Wt 160.0 lb

## 2019-02-26 DIAGNOSIS — E611 Iron deficiency: Secondary | ICD-10-CM

## 2019-02-26 DIAGNOSIS — E782 Mixed hyperlipidemia: Secondary | ICD-10-CM

## 2019-02-26 DIAGNOSIS — E538 Deficiency of other specified B group vitamins: Secondary | ICD-10-CM

## 2019-02-26 DIAGNOSIS — I77810 Thoracic aortic ectasia: Secondary | ICD-10-CM

## 2019-02-26 DIAGNOSIS — F331 Major depressive disorder, recurrent, moderate: Secondary | ICD-10-CM

## 2019-02-26 DIAGNOSIS — I10 Essential (primary) hypertension: Secondary | ICD-10-CM | POA: Diagnosis not present

## 2019-02-26 DIAGNOSIS — H9190 Unspecified hearing loss, unspecified ear: Secondary | ICD-10-CM | POA: Insufficient documentation

## 2019-02-26 DIAGNOSIS — I2510 Atherosclerotic heart disease of native coronary artery without angina pectoris: Secondary | ICD-10-CM | POA: Diagnosis not present

## 2019-02-26 DIAGNOSIS — I700 Atherosclerosis of aorta: Secondary | ICD-10-CM

## 2019-02-26 DIAGNOSIS — E119 Type 2 diabetes mellitus without complications: Secondary | ICD-10-CM

## 2019-02-26 DIAGNOSIS — R079 Chest pain, unspecified: Secondary | ICD-10-CM

## 2019-02-26 DIAGNOSIS — H9193 Unspecified hearing loss, bilateral: Secondary | ICD-10-CM

## 2019-02-26 DIAGNOSIS — Z8249 Family history of ischemic heart disease and other diseases of the circulatory system: Secondary | ICD-10-CM

## 2019-02-26 DIAGNOSIS — K219 Gastro-esophageal reflux disease without esophagitis: Secondary | ICD-10-CM

## 2019-02-26 DIAGNOSIS — K580 Irritable bowel syndrome with diarrhea: Secondary | ICD-10-CM

## 2019-02-26 MED ORDER — ACETAMINOPHEN 500 MG PO TABS: 500.0000 mg | ORAL_TABLET | Freq: Two times a day (BID) | ORAL | Status: DC

## 2019-02-26 NOTE — Progress Notes (Signed)
Virtual visit completed through [employer]. Due to national recommendations of social distancing due to [place] 19, a virtual visit is felt to be most appropriate for this patient at this time.   Patient location: home Provider location: [place] [facility], office If any vitals were documented, they were collected by patient at home unless specified below.    BP (!) 135/98 (BP Location: Left Arm, Cuff Size: Normal)   Pulse 91   Temp 98.1 F (36.7 C) (Oral)   Ht 5\' 4"  (1.626 m)   Wt 160 lb (72.6 kg)   BMI 27.46 kg/m    CC: AMW f/u visit Subjective:    Patient ID: Shelby Vazquez, female    DOB: 06/21/1951, 68 y.o.   MRN: [id number]  HPI: Shelby Vazquez is a 68 y.o. female presenting on 02/26/2019 for Annual Exam (Pt 2. )   Saw Katha Cabal this week for medicare wellness visit. Note reviewed.   HTN - bp mildly elevated today - occasional elevated readings but largely well controlled. Has had some intermittent chest discomfort throughout chest as well as some chest pressure. Chest discomfort not exertional, not relieved byrest. H/o mild nonobstructive CAD with low calcium sore (53). She regularly takes metoprolol 50mg  bid. No HA, vision changes, SOB, leg swelling.   Residual nerve pain after foot surgery ([facility]) has limited activity. Much more sedentary recently, some weight gain noted. Continues ibuprofen about 1000mg  daily.   HLD - goal LDL <100, ideally close to 70. H/o myalgias to statins - latest on rosuvastatin - tolerating this medicine better.   Notes worsening trouble with hearing. No ringing in ears.   Preventative: COLONOSCOPY WITH PROPOFOL Date: 03/31/2014 sessile serrated adenomas, sigmoid diverticulosis, rpt 3 yrs Cleotis Nipper, MD) COLONOSCOPY 03/2018 - diverticulosis, rpt 5 yrs ([name]) ESOPHAGOGASTRODUODENOSCOPY Date: 03/31/2014 mult gastric polyps, mild mucosal hemorrhages; Cleotis Nipper, MD Well woman with Dr. Helane Rima - h/o choriocarcinoma after molar  pregnancy. sees yearly in September. Normal pap DEXA4/2018osteopenia(Grewal) - upcoming, due Mammogram9/2019 WNL (Grewal) Flu shot yearly Pneumovax 01/2013, prevnar4/2017 Td 2011 Zostavax 04/2013 shingrix discussed - discussed Advanced directives: does not have this set up. Packet providedlast visit - encouraged to continue working on this. HCPOA would be daughter [name].  Seat belt use discussed Sunscreen use discussed, no changing moles on skin Dentist - sees regularly Eye exam - yearly Non smoker Alcohol - none  Married 1973. 2 daughters '74, '80; 5 grandchildren.  No h/o physical or sexual abuse. 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  Edu: [employer] Activity: really enjoys hiking, now training for 5k Diet: good water, fruits/vegetables daily      Relevant past medical, surgical, family and social history reviewed and updated as indicated. Interim medical history since our last visit reviewed. Allergies and medications reviewed and updated. Outpatient Medications Prior to Visit  Medication Sig Dispense Refill  . aspirin 81 MG tablet Take 1 tablet (81 mg total) by mouth daily. 30 tablet   . Calcium Carbonate-Vitamin D 600-200 MG-UNIT CAPS Take 1 capsule by mouth daily.    . clidinium-chlordiazePOXIDE (LIBRAX) 5-2.5 MG capsule TAKE 1 CAPSULE BY MOUTH TWO TIMES DAILY 180 capsule 0  . cyanocobalamin (,VITAMIN B-12,) 1000 MCG/ML injection INJECT INTRAMUSCULARLY 1ML  EVERY 30 DAYS (DISCARD 28  DAYS AFTER FIRST USE.) 3 mL 3  . famotidine (PEPCID) 40 MG tablet TAKE 2 TABLETS BY MOUTH AT  BEDTIME 180 tablet 0  . ibuprofen (ADVIL,MOTRIN)  200 MG tablet Take 600 mg by mouth every 6 (six) hours as needed for moderate pain.    Marland Kitchen loratadine (CLARITIN) 10 MG tablet Take 10 mg by mouth daily.     Marland Kitchen lovastatin (MEVACOR) 20 MG tablet TAKE 1 TABLET BY MOUTH AT  BEDTIME 90 tablet 1  . methenamine (HIPREX) 1 g  tablet Take 1 g by mouth 2 (two) times daily with a meal.   1  . metoprolol tartrate (LOPRESSOR) 50 MG tablet TAKE 1 TABLET BY MOUTH TWICE DAILY 60 tablet 6  . polyethylene glycol powder (MIRALAX) powder Take 1 Container by mouth. As needed    . PRESCRIPTION MEDICATION Apply topically 2 (two) times daily. Estradiol Progesterone cream 0.05-30 mg/ml    . Probiotic Product (PROBIOTIC PO) Take 2 capsules by mouth 2 (two) times daily.     . RABEprazole (ACIPHEX) 20 MG tablet Take 20 mg by mouth daily.    . rosuvastatin (CRESTOR) 20 MG tablet TAKE 1 TABLET(20 MG) BY MOUTH DAILY 30 tablet 2  . sucralfate (CARAFATE) 1 GM/10ML suspension take 10 milliliters by mouth four times a day if needed 420 mL 0  . venlafaxine XR (EFFEXOR-XR) 75 MG 24 hr capsule TAKE ONE CAPSULE BY MOUTH EVERY DAY WITH BREAKFAST 90 capsule 1   No facility-administered medications prior to visit.      Per HPI unless specifically indicated in ROS section below Review of Systems Objective:    BP (!) 135/98 (BP Location: Left Arm, Cuff Size: Normal)   Pulse 91   Temp 98.1 F (36.7 C) (Oral)   Ht 5\' 4"  (1.626 m)   Wt 160 lb (72.6 kg)   BMI 27.46 kg/m   Wt Readings from Last 3 Encounters:  02/26/19 160 lb (72.6 kg)  06/11/18 156 lb 12.8 oz (71.1 kg)  03/12/18 150 lb (68 kg)     Physical exam: Gen: alert, NAD, not ill appearing Pulm: speaks in complete sentences without increased work of breathing Psych: normal mood, normal thought content      Results for orders placed or performed in visit on 02/25/19  CBC with Differential/Platelet  Result Value Ref Range   WBC 5.2 4.0 - 10.5 K/uL   RBC 4.48 3.87 - 5.11 Mil/uL   Hemoglobin 13.8 12.0 - 15.0 g/dL   HCT 40.7 36.0 - 46.0 %   MCV 90.8 78.0 - 100.0 fl   MCHC 33.8 30.0 - 36.0 g/dL   RDW 13.3 11.5 - 15.5 %   Platelets 289.0 150.0 - 400.0 K/uL   Neutrophils Relative % 45.8 43.0 - 77.0 %   Lymphocytes Relative 43.2 12.0 - 46.0 %   Monocytes Relative 6.7 3.0 - 12.0 %    Eosinophils Relative 3.8 0.0 - 5.0 %   Basophils Relative 0.5 0.0 - 3.0 %   Neutro Abs 2.4 1.4 - 7.7 K/uL   Lymphs Abs 2.2 0.7 - 4.0 K/uL   Monocytes Absolute 0.4 0.1 - 1.0 K/uL   Eosinophils Absolute 0.2 0.0 - 0.7 K/uL   Basophils Absolute 0.0 0.0 - 0.1 K/uL  IBC panel  Result Value Ref Range   Iron 96 42 - 145 ug/dL   Transferrin 304.0 212.0 - 360.0 mg/dL   Saturation Ratios 22.6 20.0 - 50.0 %  Ferritin  Result Value Ref Range   Ferritin 12.7 10.0 - 291.0 ng/mL  Vitamin B12  Result Value Ref Range   Vitamin B-12 620 211 - 911 pg/mL  Lipid panel  Result Value Ref Range  Cholesterol 121 0 - 200 mg/dL   Triglycerides 102.0 0.0 - 149.0 mg/dL   HDL 40.10 >39.00 mg/dL   VLDL 20.4 0.0 - 40.0 mg/dL   LDL Cholesterol 61 0 - 99 mg/dL   Total CHOL/HDL Ratio 3    NonHDL 81.10   Comprehensive metabolic panel  Result Value Ref Range   Sodium 136 135 - 145 mEq/L   Potassium 4.1 3.5 - 5.1 mEq/L   Chloride 101 96 - 112 mEq/L   CO2 27 19 - 32 mEq/L   Glucose, Bld 115 (H) 70 - 99 mg/dL   BUN 15 6 - 23 mg/dL   Creatinine, Ser 0.93 0.40 - 1.20 mg/dL   Total Bilirubin 0.4 0.2 - 1.2 mg/dL   Alkaline Phosphatase 44 39 - 117 U/L   AST 23 0 - 37 U/L   ALT 17 0 - 35 U/L   Total Protein 6.7 6.0 - 8.3 g/dL   Albumin 4.3 3.5 - 5.2 g/dL   Calcium 9.2 8.4 - 10.5 mg/dL   GFR 59.93 (L) >60.00 mL/min  Hemoglobin A1c  Result Value Ref Range   Hgb A1c MFr Bld 6.5 4.6 - 6.5 %  Microalbumin/Creatinine Ratio, Urine  Result Value Ref Range   Microalb, Ur <0.7 0.0 - 1.9 mg/dL   Creatinine,U 40.8 mg/dL   Microalb Creat Ratio 1.7 0.0 - 30.0 mg/g   Assessment & Plan:  RTC 6 mo DM f/u visit Problem List Items Addressed This Visit    Vitamin B12 deficiency    Levels controlled. Gets b12 shot intermittently.       Mild ascending aorta dilation (HCC) (Chronic)    On high potency statin and tolerating well. Continue this and aspirin.       MDD (major depressive disorder), recurrent episode,  moderate (HCC)    Stable period on effexor.       Iron deficiency    Levels reassuring, iron stores low normal.       IBS   Hyperlipidemia - Primary (Chronic)    Chronic, stable. Continue rosuvastatin which si better tolerated. The ASCVD Risk score Mikey Bussing DC Jr., et al., 2013) failed to calculate for the following reasons:   The valid total cholesterol range is 130 to 320 mg/dL       Hearing loss    Note recent worsening will refer to audiology.       Relevant Orders   Ambulatory referral to Audiology   GERD, SEVERE    Continue aciphex and pepcid.       Family history of premature coronary artery disease   Essential hypertension (Chronic)    Chronic, mildly elevated. Pt will monitoring and let us know if persistently elevated.       Diet-controlled diabetes mellitus (HCC)    Chronic, stable off meds. Continue low sugar/carb diet.       Coronary artery disease, non-occlusive - Cor CA Score 53.  MIldCAD only. (Chronic)   Chest pain with moderate risk for cardiac etiology    This has recently recurred. She will monitor, and seek in-office eval with our office or cardiology if recurrent.       Abdominal aortic atherosclerosis (HCC) (Chronic)    On high potency statin and tolerating well. Continue this and aspirin.           Meds ordered this encounter  Medications  . acetaminophen (TYLENOL) 500 MG tablet    Sig: Take 1 tablet (500 mg total) by mouth 2 (two) times a day.  Orders Placed This Encounter  Procedures  . Ambulatory referral to Audiology    Referral Priority:   Routine    Referral Type:   Audiology Exam    Referral Reason:   Specialty Services Required    Number of Visits Requested:   1    Follow up plan: Return in about 6 months (around 08/29/2019) for follow up visit.  Ria Bush, MD

## 2019-02-26 NOTE — Assessment & Plan Note (Signed)
Chronic, stable off meds. Continue low sugar/carb diet.

## 2019-02-26 NOTE — Assessment & Plan Note (Signed)
Note recent worsening will refer to audiology.

## 2019-02-26 NOTE — Assessment & Plan Note (Signed)
On high potency statin and tolerating well. Continue this and aspirin.

## 2019-02-26 NOTE — Assessment & Plan Note (Signed)
Levels reassuring, iron stores low normal.

## 2019-02-26 NOTE — Assessment & Plan Note (Signed)
Stable period on effexor.  

## 2019-02-26 NOTE — Assessment & Plan Note (Signed)
Continue aciphex and pepcid.

## 2019-02-26 NOTE — Assessment & Plan Note (Signed)
Chronic, stable. Continue rosuvastatin which si better tolerated. The ASCVD Risk score Mikey Bussing DC Jr., et al., 2013) failed to calculate for the following reasons:   The valid total cholesterol range is 130 to 320 mg/dL

## 2019-02-26 NOTE — Assessment & Plan Note (Signed)
Levels controlled. Gets b12 shot intermittently.

## 2019-02-26 NOTE — Assessment & Plan Note (Signed)
Chronic, mildly elevated. Pt will monitoring and let us know if persistently elevated.

## 2019-02-26 NOTE — Assessment & Plan Note (Signed)
This has recently recurred. She will monitor, and seek in-office eval with our office or cardiology if recurrent.

## 2019-03-02 ENCOUNTER — Other Ambulatory Visit: Payer: Self-pay

## 2019-03-02 ENCOUNTER — Ambulatory Visit (INDEPENDENT_AMBULATORY_CARE_PROVIDER_SITE_OTHER): Payer: Medicare Other | Admitting: Family Medicine

## 2019-03-02 ENCOUNTER — Encounter: Payer: Self-pay | Admitting: Family Medicine

## 2019-03-02 VITALS — BP 129/80 | HR 87 | Temp 97.7°F | Ht 64.0 in | Wt 160.0 lb

## 2019-03-02 DIAGNOSIS — R1032 Left lower quadrant pain: Secondary | ICD-10-CM | POA: Insufficient documentation

## 2019-03-02 MED ORDER — CIPROFLOXACIN HCL 500 MG PO TABS: 500.0000 mg | ORAL_TABLET | Freq: Two times a day (BID) | ORAL | 0 refills | Status: DC

## 2019-03-02 MED ORDER — ONDANSETRON 4 MG PO TBDP: 4.0000 mg | ORAL_TABLET | Freq: Three times a day (TID) | ORAL | 0 refills | Status: DC | PRN

## 2019-03-02 MED ORDER — METRONIDAZOLE 500 MG PO TABS: 500.0000 mg | ORAL_TABLET | Freq: Three times a day (TID) | ORAL | 0 refills | Status: DC

## 2019-03-02 NOTE — Assessment & Plan Note (Signed)
In h/o diverticulosis, concern for diverticulitis. Episode is longer than her typical IBS flares.  Story not consistent with UTI or kidney stones.  Will treat with cipro/flagyl, I did ask her to come in tomorrow am for labwork and urinalysis for further evaluation.

## 2019-03-02 NOTE — Progress Notes (Signed)
Virtual visit completed through [employer]. Due to national recommendations of social distancing due to Gas 19, a virtual visit is felt to be most appropriate for this patient at this time.   Patient location: home Provider location: [place] at [facility], office If any vitals were documented, they were collected by patient at home unless specified below.    BP 129/80 (BP Location: Left Arm, Patient Position: Sitting)   Pulse 87   Temp 97.7 F (36.5 C) (Oral)   Ht 5\' 4"  (1.626 m)   Wt 160 lb (72.6 kg)   BMI 27.46 kg/m    CC: L abd pain Subjective:    Patient ID: Shelby Vazquez, female    DOB: 05-11-1951, 68 y.o.   MRN: [id number]  HPI: [name] is a 68 y.o. female presenting on 03/02/2019 for Flank Pain (C/o left flank pain occasionally radiating around to the back. Has had episode of constipation and loss of appetite. Also, today had mucousy rectal discharge. Took extra dose of Librax and has used Miralax and drank extra water.  Sxs started 02/28/19. Hx of diverticulosis. )   3d h/o L side ache with radiation to back associated with loss of appetite, nausea, constipation (hard formed stool) with mucous. Initial strong constant ache to side and back that has lessened some. Treating with heat, miralax, zofran, significant increase in water intake (up to 7 bottles on Sunday). This morning had episode of bowel incontinence - mostly mucous.   No fevers/chills. No blood in stool. Some increased straining.   Had only chicken and rice yesterday, only toast today.  This is a longer episode than typical irritable bowel episodes.   No h/o kidney stones.  Known h/o diverticulosis without diverticulitis.  She did recently eat more strawberries.      Relevant past medical, surgical, family and social history reviewed and updated as indicated. Interim medical history since our last visit reviewed. Allergies and medications reviewed and updated. Outpatient Medications Prior to Visit   Medication Sig Dispense Refill  . acetaminophen (TYLENOL) 500 MG tablet Take 1 tablet (500 mg total) by mouth 2 (two) times a day.    Marland Kitchen aspirin 81 MG tablet Take 1 tablet (81 mg total) by mouth daily. 30 tablet   . Calcium Carbonate-Vitamin D 600-200 MG-UNIT CAPS Take 1 capsule by mouth daily.    . clidinium-chlordiazePOXIDE (LIBRAX) 5-2.5 MG capsule TAKE 1 CAPSULE BY MOUTH TWO TIMES DAILY 180 capsule 0  . cyanocobalamin (,VITAMIN B-12,) 1000 MCG/ML injection INJECT INTRAMUSCULARLY 1ML  EVERY 30 DAYS (DISCARD 28  DAYS AFTER FIRST USE.) 3 mL 3  . famotidine (PEPCID) 40 MG tablet TAKE 2 TABLETS BY MOUTH AT  BEDTIME 180 tablet 0  . ibuprofen (ADVIL,MOTRIN) 200 MG tablet Take 600 mg by mouth every 6 (six) hours as needed for moderate pain.    Marland Kitchen loratadine (CLARITIN) 10 MG tablet Take 10 mg by mouth daily.     Marland Kitchen lovastatin (MEVACOR) 20 MG tablet TAKE 1 TABLET BY MOUTH AT  BEDTIME 90 tablet 1  . methenamine (HIPREX) 1 g tablet Take 1 g by mouth 2 (two) times daily with a meal.   1  . metoprolol tartrate (LOPRESSOR) 50 MG tablet TAKE 1 TABLET BY MOUTH TWICE DAILY 60 tablet 6  . polyethylene glycol powder (MIRALAX) powder Take 1 Container by mouth. As needed    . PRESCRIPTION MEDICATION Apply topically 2 (two) times daily. Estradiol Progesterone cream 0.05-30 mg/ml    . Probiotic Product (PROBIOTIC  PO) Take 2 capsules by mouth 2 (two) times daily.     . RABEprazole (ACIPHEX) 20 MG tablet Take 20 mg by mouth daily.    . rosuvastatin (CRESTOR) 20 MG tablet TAKE 1 TABLET(20 MG) BY MOUTH DAILY 30 tablet 2  . sucralfate (CARAFATE) 1 GM/10ML suspension take 10 milliliters by mouth four times a day if needed 420 mL 0  . venlafaxine XR (EFFEXOR-XR) 75 MG 24 hr capsule TAKE ONE CAPSULE BY MOUTH EVERY DAY WITH BREAKFAST 90 capsule 1   No facility-administered medications prior to visit.      Per HPI unless specifically indicated in ROS section below Review of Systems Objective:    BP 129/80 (BP Location:  Left Arm, Patient Position: Sitting)   Pulse 87   Temp 97.7 F (36.5 C) (Oral)   Ht 5\' 4"  (1.626 m)   Wt 160 lb (72.6 kg)   BMI 27.46 kg/m   Wt Readings from Last 3 Encounters:  03/02/19 160 lb (72.6 kg)  02/26/19 160 lb (72.6 kg)  06/11/18 156 lb 12.8 oz (71.1 kg)     Physical exam: Gen: alert, NAD, not ill appearing Pulm: speaks in complete sentences without increased work of breathing Psych: normal mood, normal thought content      Assessment & Plan:   Problem List Items Addressed This Visit    LLQ abdominal pain - Primary    In h/o diverticulosis, concern for diverticulitis. Episode is longer than her typical IBS flares.  Story not consistent with UTI or kidney stones.  Will treat with cipro/flagyl, I did ask her to come in tomorrow am for labwork and urinalysis for further evaluation.      Relevant Orders   Comprehensive metabolic panel   CBC with Differential/Platelet   Urinalysis Dipstick       Meds ordered this encounter  Medications  . metroNIDAZOLE (FLAGYL) 500 MG tablet    Sig: Take 1 tablet (500 mg total) by mouth 3 (three) times daily.    Dispense:  30 tablet    Refill:  0  . ciprofloxacin (CIPRO) 500 MG tablet    Sig: Take 1 tablet (500 mg total) by mouth 2 (two) times daily.    Dispense:  20 tablet    Refill:  0  . ondansetron (ZOFRAN ODT) 4 MG disintegrating tablet    Sig: Take 1 tablet (4 mg total) by mouth every 8 (eight) hours as needed for nausea or vomiting.    Dispense:  20 tablet    Refill:  0   Orders Placed This Encounter  Procedures  . Comprehensive metabolic panel    Standing Status:   Future    Standing Expiration Date:   03/01/2020  . CBC with Differential/Platelet    Standing Status:   Future    Standing Expiration Date:   03/01/2020  . Urinalysis Dipstick    Standing Status:   Future    Standing Expiration Date:   04/02/2019    Follow up plan: No follow-ups on file.  Ria Bush, MD

## 2019-03-03 ENCOUNTER — Other Ambulatory Visit (INDEPENDENT_AMBULATORY_CARE_PROVIDER_SITE_OTHER): Payer: Medicare Other

## 2019-03-03 DIAGNOSIS — R1032 Left lower quadrant pain: Secondary | ICD-10-CM

## 2019-03-03 LAB — CBC WITH DIFFERENTIAL/PLATELET
Basophils Absolute: 0 10*3/uL (ref 0.0–0.1)
Basophils Relative: 0.3 % (ref 0.0–3.0)
Eosinophils Absolute: 0.2 10*3/uL (ref 0.0–0.7)
Eosinophils Relative: 2.6 % (ref 0.0–5.0)
HCT: 38.9 % (ref 36.0–46.0)
Hemoglobin: 13.1 g/dL (ref 12.0–15.0)
Lymphocytes Relative: 25.2 % (ref 12.0–46.0)
Lymphs Abs: 1.8 10*3/uL (ref 0.7–4.0)
MCHC: 33.6 g/dL (ref 30.0–36.0)
MCV: 90.9 fl (ref 78.0–100.0)
Monocytes Absolute: 0.6 10*3/uL (ref 0.1–1.0)
Monocytes Relative: 8.6 % (ref 3.0–12.0)
Neutro Abs: 4.4 10*3/uL (ref 1.4–7.7)
Neutrophils Relative %: 63.3 % (ref 43.0–77.0)
Platelets: 298 10*3/uL (ref 150.0–400.0)
RBC: 4.28 Mil/uL (ref 3.87–5.11)
RDW: 13.3 % (ref 11.5–15.5)
WBC: 6.9 10*3/uL (ref 4.0–10.5)

## 2019-03-03 LAB — COMPREHENSIVE METABOLIC PANEL
ALT: 17 U/L (ref 0–35)
AST: 23 U/L (ref 0–37)
Albumin: 4.3 g/dL (ref 3.5–5.2)
Alkaline Phosphatase: 49 U/L (ref 39–117)
BUN: 10 mg/dL (ref 6–23)
CO2: 29 mEq/L (ref 19–32)
Calcium: 9.5 mg/dL (ref 8.4–10.5)
Chloride: 100 mEq/L (ref 96–112)
Creatinine, Ser: 0.97 mg/dL (ref 0.40–1.20)
GFR: 57.08 mL/min — ABNORMAL LOW (ref >60.00)
Glucose, Bld: 100 mg/dL — ABNORMAL HIGH (ref 70–99)
Potassium: 4.3 mEq/L (ref 3.5–5.1)
Sodium: 135 mEq/L (ref 135–145)
Total Bilirubin: 0.4 mg/dL (ref 0.2–1.2)
Total Protein: 6.9 g/dL (ref 6.0–8.3)

## 2019-03-03 LAB — POCT URINALYSIS DIPSTICK
Bilirubin, UA: NEGATIVE
Blood, UA: NEGATIVE
Glucose, UA: NEGATIVE
Ketones, UA: NEGATIVE
Leukocytes, UA: NEGATIVE
Nitrite, UA: NEGATIVE
Protein, UA: NEGATIVE
Spec Grav, UA: 1.015 (ref 1.010–1.025)
Urobilinogen, UA: 0.2 E.U./dL
pH, UA: 6 (ref 5.0–8.0)

## 2019-03-27 ENCOUNTER — Other Ambulatory Visit: Payer: Self-pay | Admitting: Family Medicine

## 2019-05-03 ENCOUNTER — Telehealth: Payer: Self-pay | Admitting: Family Medicine

## 2019-05-03 MED ORDER — "NEEDLE (DISP) 25G X 1"" MISC": 1000.0000 ug | 1 refills | Status: DC

## 2019-05-03 NOTE — Telephone Encounter (Signed)
Best number [phone]  Pt called stating she gets b12 inj once monthly.  Her daughter gives these to her and she needs a rx for needles  [facility] line # [phone]

## 2019-05-03 NOTE — Telephone Encounter (Signed)
E-scribed rx for needles per pt request to [facility].

## 2019-05-06 MED ORDER — "SYRINGE/NEEDLE (DISP) 25G X 1"" 3 ML MISC": 1.0000 | 0 refills | Status: DC

## 2019-05-06 NOTE — Addendum Note (Signed)
Addended by: Brenton Grills on: 7/00/5259 10:28 AM   Modules accepted: Orders

## 2019-05-10 ENCOUNTER — Other Ambulatory Visit: Payer: Self-pay

## 2019-05-10 MED ORDER — METOPROLOL TARTRATE 50 MG PO TABS: 50.0000 mg | ORAL_TABLET | Freq: Two times a day (BID) | ORAL | 6 refills | Status: DC

## 2019-05-27 ENCOUNTER — Other Ambulatory Visit: Payer: Self-pay | Admitting: Family Medicine

## 2019-06-20 ENCOUNTER — Other Ambulatory Visit: Payer: Self-pay | Admitting: Family Medicine

## 2019-07-12 ENCOUNTER — Encounter: Payer: Self-pay | Admitting: Cardiology

## 2019-07-12 ENCOUNTER — Ambulatory Visit: Payer: Medicare Other | Admitting: Cardiology

## 2019-07-12 ENCOUNTER — Other Ambulatory Visit: Payer: Self-pay

## 2019-07-12 VITALS — BP 138/80 | HR 83 | Temp 96.9°F | Ht 64.5 in | Wt 160.4 lb

## 2019-07-12 DIAGNOSIS — I2510 Atherosclerotic heart disease of native coronary artery without angina pectoris: Secondary | ICD-10-CM

## 2019-07-12 DIAGNOSIS — E782 Mixed hyperlipidemia: Secondary | ICD-10-CM

## 2019-07-12 DIAGNOSIS — I712 Thoracic aortic aneurysm, without rupture, unspecified: Secondary | ICD-10-CM

## 2019-07-12 DIAGNOSIS — R030 Elevated blood-pressure reading, without diagnosis of hypertension: Secondary | ICD-10-CM | POA: Diagnosis not present

## 2019-07-12 DIAGNOSIS — I77810 Thoracic aortic ectasia: Secondary | ICD-10-CM

## 2019-07-12 DIAGNOSIS — R002 Palpitations: Secondary | ICD-10-CM

## 2019-07-12 MED ORDER — METOPROLOL TARTRATE 50 MG PO TABS: 50.0000 mg | ORAL_TABLET | Freq: Two times a day (BID) | ORAL | 6 refills | Status: DC

## 2019-07-12 NOTE — Progress Notes (Signed)
PCP: Ria Bush, MD  Clinic Note: Chief Complaint  Patient presents with  . Follow-up    Thoracic aortic dilation/aneurysm    HPI: Shelby Vazquez is a 68 y.o. female with a PMH notable for TAA & Coronary CTA showing non-obstructive CAD, who presents today for follow-up evaluation of  Fatigue, rapid heartbeat, shortness of breath with exertion, family history of CAD. * November 2018 mostly noting exertional dyspnea and fatigue with family history of CAD ->  evaluated with an echocardiogram and coronary CTA (reviewed below) - non-obstructive CAD. --Started on metoprolol 25 twice daily as well as aspirin 81 mg daily.    Shelby Vazquez was last seen back in January of this year.  She is doing quite well, notably feeling better over the previous 2 weeks.  Back to her normal self.  She had one episode of chest pain in the preceding 2 weeks but was otherwise back to her normal self.  Was able to do a [place] around the neighborhood without discomfort.  Recent Hospitalizations:   none  Studies Personally Reviewed - (if available, images/films reviewed: From Epic Chart or Care Everywhere)  December 2019, CTA Chest Aorta: Max diameter of a sending aorta at 4.0 cm.  Sinus of Valsalva and sinotubular junction 3.5 and 2.8 cm respectively.  No dissection or intramural hematoma.  Atherosclerotic calcification and smooth soft plaque noted.  Atherotic calcification and subclavian arteries as well as tiny punctate calcification LAD.  F/u CT Chest Aorta ordered for Dec  Interval History: Shelby Vazquez presents here today overall feeling fine.  She is quite stressed out nowadays with having to help her daughters out with her children during this COVID-19 stage.  She is homeschooling 1 grandkid while caring for the baby sister and the high school senior older sister.  There is also 43 5 year old that she is helping to care for and another 68 year old.  Between her and her husband there being strapped  into many locations.  She is very stressed out today.  She tells me that her blood pressure is higher today than it usually is because of that.  She said one episode when all this started in April her blood pressure went up to 180/103 and she felt a fluttering sensation in her chest.  Lasted about an hour went away.  This is very similar to what happened last year in April.  Very similar symptoms but has not had any further since.  She says that her palpitations are pretty well controlled.  She does not have them all that often, but when they come they are bothersome to her.  May be couple times a month but they do not last very long. When she notes that her blood pressures that goes up in time to stress and this oftentimes will trigger her palpitations.  She is always on the go.  Running around the house, doing chores including cleaning.  She also then tries to walk as much as possible.  She does a lot of the shopping for her daughters and so she does a lot of walking around the grocery stores.  Other than the fluctuating blood pressures and intermittent palpitations she is pretty stable from cardiac standpoint. Cardiovascular ROS: no chest pain or dyspnea on exertion positive for - palpitations, rapid heart rate and Situational blood pressure issues oftentimes related to stress and anxiety negative for - edema, irregular heartbeat, orthopnea, paroxysmal nocturnal dyspnea, shortness of breath or Syncope/near syncope, TIA/amaurosis fugax, claudication.  ROS:  A comprehensive was performed.  Pertinent symptoms noted in HPI Review of Systems  Constitutional: Negative for malaise/fatigue and weight loss (Actually gained about 4 5 pounds in the early stages of COVID-19 lockdown and has not been able to lose it back yet.).  HENT: Negative for congestion and nosebleeds.   Respiratory: Negative for cough and wheezing.   Gastrointestinal: Positive for heartburn (Depends on what she eats). Negative for  abdominal pain, blood in stool, melena and nausea.  Genitourinary: Negative for hematuria.  Musculoskeletal: Negative for joint pain.  Neurological: Negative for focal weakness and seizures.  Psychiatric/Behavioral: Negative for depression and memory loss. The patient is not nervous/anxious and does not have insomnia.   All other systems reviewed and are negative.  I have reviewed and (if needed) personally updated the patient's problem list, medications, allergies, past medical and surgical history, social and family history.   Past Medical History:  Diagnosis Date  . Abdominal aortic atherosclerosis ([place]) 10/2014   by CT scan  . B12 deficiency   . Chronic anxiety   . Chronic cough    cyclical cough syndrome  . Complication of anesthesia 9 yrs ago   awake and in severe pain with colonoscopy  . Concussion   . Diet-controlled diabetes mellitus ([place]) 02/22/2017   New dx 02/2017  . GERD (gastroesophageal reflux disease)    severe leading to chronic cough and hoarseness  . Hyperlipidemia   . Hypertension   . IBS (irritable bowel syndrome)    Shelby Vazquez - 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, PB-8 probiotic, healthy diet and exercise  . 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 Osteopenia 01/2015   T -0.8 spine, -1.7 hip  . Pyelonephritis due to Escherichia coli 07/16/2017  . Uterine prolapse   . Vocal cord paralysis, bilateral partial 2001   evaluated at [facility] - did not require surgery    Past Surgical History:  Procedure Laterality Date  . BUNIONECTOMY     [place] '2000  . CATARACT EXTRACTION     right 01/2010, left may 2011  . COLONOSCOPY  03/2018   diverticulosis, rpt 5 yrs (Shelby Vazquez)  . COLONOSCOPY WITH PROPOFOL N/A 03/31/2014   sessile serrated adenomas, sigmoid diverticulosis, rpt 3 yrs Shelby Nipper, MD)  . Coronary Calcium  Score/CTA  09/2017   Calcium score 53 . Normal right dominant coronary artery system with mild onset of CAD. Aneurysmal dilation of the ascending aorta with maximum diameter diameter of 4.0-3.8 centimeter. -Recommend annual CT/MRA December 2018  . DILATION AND CURETTAGE OF UTERUS  '77   for molar pregnancy excision  . ESOPHAGOGASTRODUODENOSCOPY N/A 03/31/2014   mult gastric polyps, mild mucosal hemorrhages; ESOPHAGOGASTRODUODENOSCOPY (EGD);  Surgeon: Shelby Nipper, MD  . ESOPHAGOGASTRODUODENOSCOPY ENDOSCOPY  april 2014  . INTERSTIM IMPLANT PLACEMENT  2018  . TRANSTHORACIC ECHOCARDIOGRAM  09/2017   Normal LV size and function. EF 55-60%. GR 1 DD. No regional wall motion abnormalities. No significant valvular abnormalities.    Current Meds  Medication Sig  . acetaminophen (TYLENOL) 500 MG tablet Take 1 tablet (500 mg total) by mouth 2 (two) times a day.  Marland Kitchen aspirin 81 MG tablet Take 1 tablet (81 mg total) by mouth daily.  . Calcium Carbonate-Vitamin D 600-200 MG-UNIT CAPS Take 1 capsule by mouth daily.  . clidinium-chlordiazePOXIDE (LIBRAX) 5-2.5 MG capsule TAKE  1 CAPSULE BY MOUTH TWO TIMES DAILY  . cyanocobalamin (,VITAMIN B-12,) 1000 MCG/ML injection INJECT INTRAMUSCULARLY 1ML  EVERY 30 DAYS (DISCARD 28  DAYS AFTER FIRST USE.)  . famotidine (PEPCID) 40 MG tablet TAKE 2 TABLETS BY MOUTH AT  BEDTIME  . ibuprofen (ADVIL,MOTRIN) 200 MG tablet Take 600 mg by mouth every 6 (six) hours as needed for moderate pain.  Marland Kitchen loratadine (CLARITIN) 10 MG tablet Take 10 mg by mouth daily.   . methenamine (HIPREX) 1 g tablet Take 1 g by mouth 2 (two) times daily with a meal.   . metoprolol tartrate (LOPRESSOR) 50 MG tablet Take 1 tablet (50 mg total) by mouth 2 (two) times daily. MAY TAKE AN EXTRA 25 MG to 50 MG  Tablet for palp. And/or blood pressures  . ondansetron (ZOFRAN ODT) 4 MG disintegrating tablet Take 1 tablet (4 mg total) by mouth every 8 (eight) hours as needed for nausea or vomiting.  .  polyethylene glycol powder (MIRALAX) powder Take 1 Container by mouth. As needed  . PRESCRIPTION MEDICATION Apply topically 2 (two) times daily. Estradiol Progesterone cream 0.05-30 mg/ml  . Probiotic Product (PROBIOTIC PO) Take 2 capsules by mouth 2 (two) times daily.   . RABEprazole (ACIPHEX) 20 MG tablet Take 20 mg by mouth daily.  . rosuvastatin (CRESTOR) 20 MG tablet TAKE 1 TABLET(20 MG) BY MOUTH DAILY  . sucralfate (CARAFATE) 1 GM/10ML suspension take 10 milliliters by mouth four times a day if needed  . SYRINGE-NEEDLE, DISP, 3 ML 25G X 1" 3 ML MISC 1 each by Does not apply route every 30 (thirty) days. Use as directed to inject vitamin B12.  Dx code E53.8  . venlafaxine XR (EFFEXOR-XR) 75 MG 24 hr capsule TAKE 1 CAPSULE BY MOUTH EVERY DAY WITH BREAKFAST  . [DISCONTINUED] metoprolol tartrate (LOPRESSOR) 50 MG tablet Take 1 tablet (50 mg total) by mouth 2 (two) times daily.    Allergies  Allergen Reactions  . Augmentin [Amoxicillin-Pot Clavulanate] Other (See Comments)    Severe diarrhea, rash, worsened acid reflux  . Erythromycin Rash   Social History   Tobacco Use  . Smoking status: Never Smoker  . Smokeless tobacco: Never Used  Substance Use Topics  . Alcohol use: Yes    Comment: twice a year  . Drug use: No   Social History   Social History Narrative   Married 1973. 2 daughters '74, '80; 5 grandchildren.    No h/o physical or sexual abuse. 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    Edu: [employer].    Activity: stays active.   Diet: good water, fruits/vegetables daily    family history includes Cancer in her father and maternal aunt; Heart attack in her maternal grandfather and maternal grandmother; Heart attack (age of onset: 67) in her mother; Heart disease in her paternal grandfather and paternal grandmother; Heart disease (age of onset: 8) in her mother; Hyperlipidemia in her brother and mother;  Hypertension in her brother and mother.  Wt Readings from Last 3 Encounters:  07/12/19 160 lb 6.4 oz (72.8 kg)  03/02/19 160 lb (72.6 kg)  02/26/19 160 lb (72.6 kg)    PHYSICAL EXAM BP 138/80   Pulse 83   Temp (!) 96.9 F (36.1 C)   Ht 5' 4.5" (1.638 m)   Wt 160 lb 6.4 oz (72.8 kg)   SpO2 97%   BMI 27.11 kg/m  Physical Exam  Constitutional: She is  oriented to person, place, and time. She appears well-developed and well-nourished. No distress.  Appears younger than her stated age.    HENT:  Head: Normocephalic and atraumatic.  Neck: Normal range of motion. Neck supple. No hepatojugular reflux and no JVD present. Carotid bruit is not present.  Cardiovascular: Normal rate, regular rhythm and intact distal pulses.  No extrasystoles are present. PMI is not displaced. Exam reveals gallop and S4. Exam reveals no friction rub.  No murmur heard. Pulmonary/Chest: Effort normal and breath sounds normal. No respiratory distress. She has no wheezes. She has no rales.  Abdominal: Soft. Bowel sounds are normal. She exhibits no distension. There is no abdominal tenderness. There is no rebound.  Musculoskeletal: Normal range of motion.        General: No edema.  Neurological: She is alert and oriented to person, place, and time.  Psychiatric: She has a normal mood and affect. Her behavior is normal. Judgment and thought content normal.  Just seems a little bit "frazzled"  today.  Quite worked up with hemic run after her kids but also with just how crazy it is trying to do the kid's school under the current predicament.  Nursing note and vitals reviewed.   Adult ECG Report Normal sinus rhythm, rate 83 bpm.  Normal axis, intervals durations. Normal/stable EKG  Other studies Reviewed: Additional studies/ records that were reviewed today include:  Recent Labs:   Lab Results  Component Value Date   CREATININE 0.97 03/03/2019   BUN 10 03/03/2019   NA 135 03/03/2019   K 4.3 03/03/2019   CL  100 03/03/2019   CO2 29 03/03/2019   Lab Results  Component Value Date   CHOL 121 02/25/2019   HDL 40.10 02/25/2019   LDLCALC 61 02/25/2019   LDLDIRECT 109.0 12/25/2017   TRIG 102.0 02/25/2019   CHOLHDL 3 02/25/2019    ASSESSMENT / PLAN:  Problem List Items Addressed This Visit    Coronary artery disease, non-occlusive - Cor CA Score 53.  MIldCAD only. (Chronic)    Thankfully, only mild disease noted.  CT scan suggested coronary calcification in the LAD but mild.  Relatively low coronary calcium score despite having thoracic and abdominal aortic calcification.  Plan: Continue lipid management.  LDL is 1069.  I would like for her LDL to be less than 100 (is currently on Crestor, may need to titrate). Continue blood pressure control --> currently only on one medication.      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  If stable on next follow-up, we will probably do it every 2 years.  Plan: CT Angiogram Chest-Aorta as ordered in December  Lipid modification.  Currently on 20 mg rosuvastatin -> consider titrating further pending results of follow-up CT.  Blood pressure control, currently on metoprolol alone.      Relevant Medications   metoprolol tartrate (LOPRESSOR) 50 MG tablet   Other Relevant Orders   EKG 12-Lead (Completed)   Comprehensive metabolic panel   CT ANGIO CHEST AORTA W &/OR WO CONTRAST   Hyperlipidemia (Chronic)    Goal is less than 100 for LDL and ideally less than 70.  Currently not quite  at goal.  Tolerating Crestor.    However if LDL does not come down below 100 on next check, would strongly consider either titrating to 40 mg daily or adding Zetia      Relevant  Medications   metoprolol tartrate (LOPRESSOR) 50 MG tablet   Rapid palpitations (Chronic)    Pretty well controlled on current dose of metoprolol.  Will provide additional doses of 50 mg tablets for breakthrough episodes of either palpitations or hypertension.  Nothing really to suggest a true arrhythmia.      Situational hypertension    She really has more situational hypertension more so than just true hypertension.  For the most part well controlled on 50 mg twice daily metoprolol.  I would provide her additional dose of metoprolol to take as needed high blood pressure or palpitations.  If her pressures would drift up, may need to consider further afterload reduction with ARB/ACE or HCTZ.      Relevant Medications   metoprolol tartrate (LOPRESSOR) 50 MG tablet    Other Visit Diagnoses    Thoracic aortic aneurysm without rupture (HCC)       Relevant Medications   metoprolol tartrate (LOPRESSOR) 50 MG tablet   Other Relevant Orders   CT ANGIO CHEST AORTA W &/OR WO CONTRAST      Current medicines are reviewed at length with the patient today. (+/- concerns) n/a  Patient Instructions  Medication Instructions:  MAY USE METOPROLOL TARTRATE  - FOR PALPATION AND BLOOD PRESSURE If you need a refill on your cardiac medications before your next appointment, please call your pharmacy.   Lab work: CMP -  [place] 2020 If you have labs (blood work) drawn today and your tests are completely normal, you will receive your results only by: Marland Kitchen MyChart Message (if you have MyChart) OR . A paper copy in the mail If you have any lab test that is abnormal or we need to change your treatment, we will call you to review the results.  Testing/Procedures: Will be schedule at [place] 2020-CT ANGIOGRAM OF AORTA Non-Cardiac CT Angiography (CTA), is a special type of CT scan that uses a computer to produce multi-dimensional views of major blood vessels throughout the body. In CT  angiography, a contrast material is injected through an IV to help visualize the blood vessels  Follow-Up: At [facility], you and your health needs are our priority.  As part of our continuing mission to provide you with exceptional heart care, we have created designated Provider Care Teams.  These Care Teams include your primary Cardiologist (physician) and Advanced Practice Providers (APPs -  Physician Assistants and Nurse Practitioners) who all work together to provide you with the care you need, when you need it. . You will need a follow up appointment in  12 months.  Please call our office 2 months in advance to schedule this appointment.  You may see Glenetta Hew, MD or one of the following Advanced Practice Providers on your designated Care Team:   . Rosaria Ferries, PA-C . Jory Sims, DNP, ANP  Any Other Special Instructions Will Be Listed Below (If Applicable).  Studies Ordered:   Orders Placed This Encounter  Procedures  . CT ANGIO CHEST AORTA W &/OR WO CONTRAST  . Comprehensive metabolic panel  . EKG 12-Lead      Glenetta Hew, M.D., M.S. Interventional Cardiologist   Pager # [phone] Phone # [phone] [address] [place], [place] [address]

## 2019-07-12 NOTE — Patient Instructions (Addendum)
Medication Instructions:  MAY USE METOPROLOL TARTRATE  - FOR PALPATION AND BLOOD PRESSURE If you need a refill on your cardiac medications before your next appointment, please call your pharmacy.   Lab work: CMP -  [place] 2020 If you have labs (blood work) drawn today and your tests are completely normal, you will receive your results only by: Marland Kitchen MyChart Message (if you have MyChart) OR . A paper copy in the mail If you have any lab test that is abnormal or we need to change your treatment, we will call you to review the results.  Testing/Procedures: Will be schedule at [place] 2020-CT ANGIOGRAM OF AORTA Non-Cardiac CT Angiography (CTA), is a special type of CT scan that uses a computer to produce multi-dimensional views of major blood vessels throughout the body. In CT angiography, a contrast material is injected through an IV to help visualize the blood vessels  Follow-Up: At [facility], you and your health needs are our priority.  As part of our continuing mission to provide you with exceptional heart care, we have created designated Provider Care Teams.  These Care Teams include your primary Cardiologist (physician) and Advanced Practice Providers (APPs -  Physician Assistants and Nurse Practitioners) who all work together to provide you with the care you need, when you need it. . You will need a follow up appointment in  12 months.  Please call our office 2 months in advance to schedule this appointment.  You may see Glenetta Hew, MD or one of the following Advanced Practice Providers on your designated Care Team:   . Rosaria Ferries, PA-C . Jory Sims, DNP, ANP  Any Other Special Instructions Will Be Listed Below (If Applicable).

## 2019-07-15 ENCOUNTER — Encounter: Payer: Self-pay | Admitting: Cardiology

## 2019-07-15 NOTE — Assessment & Plan Note (Signed)
Goal is less than 100 for LDL and ideally less than 70.  Currently not quite at goal.  Tolerating Crestor.    However if LDL does not come down below 100 on next check, would strongly consider either titrating to 40 mg daily or adding Zetia

## 2019-07-15 NOTE — Assessment & Plan Note (Signed)
Thankfully, only mild disease noted.  CT scan suggested coronary calcification in the LAD but mild.  Relatively low coronary calcium score despite having thoracic and abdominal aortic calcification.  Plan: Continue lipid management.  LDL is 1069.  I would like for her LDL to be less than 100 (is currently on Crestor, may need to titrate). Continue blood pressure control --> currently only on one medication.

## 2019-07-15 NOTE — Assessment & Plan Note (Signed)
She really has more situational hypertension more so than just true hypertension.  For the most part well controlled on 50 mg twice daily metoprolol.  I would provide her additional dose of metoprolol to take as needed high blood pressure or palpitations.  If her pressures would drift up, may need to consider further afterload reduction with ARB/ACE or HCTZ.

## 2019-07-15 NOTE — Assessment & Plan Note (Signed)
Pretty well controlled on current dose of metoprolol.  Will provide additional doses of 50 mg tablets for breakthrough episodes of either palpitations or hypertension.  Nothing really to suggest a true arrhythmia.

## 2019-07-15 NOTE — Assessment & Plan Note (Signed)
Plan was to recheck annually again this year as it was stable from 20 18-20 19.  If stable on next follow-up, we will probably do it every 2 years.  Plan: CT Angiogram Chest-Aorta as ordered in December  Lipid modification.  Currently on 20 mg rosuvastatin -> consider titrating further pending results of follow-up CT.  Blood pressure control, currently on metoprolol alone.

## 2019-07-30 ENCOUNTER — Telehealth: Payer: Self-pay | Admitting: Family Medicine

## 2019-07-30 NOTE — Telephone Encounter (Signed)
Left message on voicemail No pneumonia needed, already had both required after 68 years of age

## 2019-07-30 NOTE — Telephone Encounter (Signed)
Patient would like to know if she is due for her pneumonia shot. So she can schedule an appointment, she is already on the flu schedule for Tuesday   Patient would like a call back

## 2019-08-03 ENCOUNTER — Ambulatory Visit: Payer: Medicare Other

## 2019-08-19 ENCOUNTER — Ambulatory Visit (INDEPENDENT_AMBULATORY_CARE_PROVIDER_SITE_OTHER): Payer: Medicare Other

## 2019-08-19 DIAGNOSIS — Z23 Encounter for immunization: Secondary | ICD-10-CM | POA: Diagnosis not present

## 2019-08-23 ENCOUNTER — Other Ambulatory Visit: Payer: Self-pay | Admitting: Family Medicine

## 2019-08-26 ENCOUNTER — Other Ambulatory Visit: Payer: Self-pay | Admitting: Family Medicine

## 2019-08-26 NOTE — Telephone Encounter (Signed)
Pt called checking on her refill for  Chlordiaze/clid  She stated she will run out before mail order gets here  Can you transfer this rx to local pharmacy [facility] on [address]  Please advise when done

## 2019-08-26 NOTE — Telephone Encounter (Signed)
Please review other refill request. This medication was pulled down for local pharmacy refill and the other message is for mail order refill. Thank you

## 2019-08-26 NOTE — Telephone Encounter (Signed)
Pt called checking on her rx's

## 2019-08-26 NOTE — Telephone Encounter (Signed)
Last office visit 03/02/2019 for Flank Pain.  Last refilled Vit B12 12/10/2018 for 3 ml with 3 refills.  Last Vit B12 Level 02/25/2019 at 620 pg/ml.  Librax 01/27/2019 for #180 with no refills.  No future appointments with PCP.

## 2019-08-26 NOTE — Telephone Encounter (Signed)
Would like a 90 day supply.

## 2019-08-27 MED ORDER — CILIDINIUM-CHLORDIAZEPOXIDE 2.5-5 MG PO CAPS: ORAL_CAPSULE | ORAL | 0 refills | Status: DC

## 2019-08-27 NOTE — Telephone Encounter (Signed)
ERx 

## 2019-08-27 NOTE — Telephone Encounter (Signed)
Erx locally and to mail order

## 2019-09-21 LAB — HM MAMMOGRAPHY

## 2019-10-07 LAB — COMPREHENSIVE METABOLIC PANEL
ALT: 24 IU/L (ref 0–32)
AST: 36 IU/L (ref 0–40)
Albumin/Globulin Ratio: 2.3 — ABNORMAL HIGH (ref 1.2–2.2)
Albumin: 4.3 g/dL (ref 3.8–4.8)
Alkaline Phosphatase: 55 IU/L (ref 39–117)
BUN/Creatinine Ratio: 17 (ref 12–28)
BUN: 14 mg/dL (ref 8–27)
Bilirubin Total: 0.3 mg/dL (ref 0.0–1.2)
CO2: 22 mmol/L (ref 20–29)
Calcium: 9.6 mg/dL (ref 8.7–10.3)
Chloride: 99 mmol/L (ref 96–106)
Creatinine, Ser: 0.84 mg/dL (ref 0.57–1.00)
GFR calc Af Amer: 83 mL/min/{1.73_m2} (ref >59)
GFR calc non Af Amer: 72 mL/min/{1.73_m2} (ref >59)
Globulin, Total: 1.9 g/dL (ref 1.5–4.5)
Glucose: 96 mg/dL (ref 65–99)
Potassium: 4.6 mmol/L (ref 3.5–5.2)
Sodium: 135 mmol/L (ref 134–144)
Total Protein: 6.2 g/dL (ref 6.0–8.5)

## 2019-10-07 LAB — HM DIABETES EYE EXAM

## 2019-10-11 ENCOUNTER — Other Ambulatory Visit: Payer: Self-pay

## 2019-10-11 ENCOUNTER — Ambulatory Visit (INDEPENDENT_AMBULATORY_CARE_PROVIDER_SITE_OTHER)
Admission: RE | Admit: 2019-10-11 | Discharge: 2019-10-11 | Disposition: A | Discharge status: Home / Self Care | Payer: Medicare Other | Source: Ambulatory Visit | Attending: Cardiology | Admitting: Cardiology

## 2019-10-11 DIAGNOSIS — I77810 Thoracic aortic ectasia: Secondary | ICD-10-CM

## 2019-10-11 DIAGNOSIS — I712 Thoracic aortic aneurysm, without rupture, unspecified: Secondary | ICD-10-CM

## 2019-10-11 DIAGNOSIS — I2510 Atherosclerotic heart disease of native coronary artery without angina pectoris: Secondary | ICD-10-CM

## 2019-10-11 MED ORDER — IOHEXOL 350 MG/ML SOLN
100.0000 mL | Freq: Once | INTRAVENOUS | Status: AC | PRN
  Administered 2019-10-11: 100 mL via INTRAVENOUS

## 2019-10-18 ENCOUNTER — Encounter: Payer: Self-pay | Admitting: Family Medicine

## 2019-10-25 DIAGNOSIS — M25462 Effusion, left knee: Secondary | ICD-10-CM | POA: Diagnosis not present

## 2019-10-25 DIAGNOSIS — M6281 Muscle weakness (generalized): Secondary | ICD-10-CM | POA: Diagnosis not present

## 2019-10-27 DIAGNOSIS — M6281 Muscle weakness (generalized): Secondary | ICD-10-CM | POA: Diagnosis not present

## 2019-10-27 DIAGNOSIS — M25462 Effusion, left knee: Secondary | ICD-10-CM | POA: Diagnosis not present

## 2019-11-05 DIAGNOSIS — M25462 Effusion, left knee: Secondary | ICD-10-CM | POA: Diagnosis not present

## 2019-11-05 DIAGNOSIS — M6281 Muscle weakness (generalized): Secondary | ICD-10-CM | POA: Diagnosis not present

## 2019-11-10 ENCOUNTER — Ambulatory Visit: Payer: Medicare PPO | Attending: Internal Medicine

## 2019-11-10 DIAGNOSIS — M25462 Effusion, left knee: Secondary | ICD-10-CM | POA: Diagnosis not present

## 2019-11-10 DIAGNOSIS — Z23 Encounter for immunization: Secondary | ICD-10-CM

## 2019-11-10 DIAGNOSIS — M6281 Muscle weakness (generalized): Secondary | ICD-10-CM | POA: Diagnosis not present

## 2019-11-10 DIAGNOSIS — R339 Retention of urine, unspecified: Secondary | ICD-10-CM | POA: Diagnosis not present

## 2019-11-10 NOTE — Progress Notes (Signed)
   Covid-19 Vaccination Clinic  Name:  Shelby Vazquez    MRN: [id number] DOB: 16-Aug-1951  11/10/2019  [name] was observed post Covid-19 immunization for 15 minutes without incidence. She was provided with Vaccine Information Sheet and instruction to access the V-Safe system.   [name] was instructed to call 911 with any severe reactions post vaccine: Marland Kitchen Difficulty breathing  . Swelling of your face and throat  . A fast heartbeat  . A bad rash all over your body  . Dizziness and weakness    Immunizations Administered    Name Date Dose VIS Date Route   Pfizer COVID-19 Vaccine 11/10/2019  5:22 PM 0.3 mL 10/01/2019 Intramuscular   Manufacturer: [place]   Lot: [id number]   [place]: [id number]

## 2019-11-15 DIAGNOSIS — M6281 Muscle weakness (generalized): Secondary | ICD-10-CM | POA: Diagnosis not present

## 2019-11-15 DIAGNOSIS — M25462 Effusion, left knee: Secondary | ICD-10-CM | POA: Diagnosis not present

## 2019-11-16 ENCOUNTER — Other Ambulatory Visit: Payer: Self-pay | Admitting: Family Medicine

## 2019-11-18 DIAGNOSIS — M6281 Muscle weakness (generalized): Secondary | ICD-10-CM | POA: Diagnosis not present

## 2019-11-18 DIAGNOSIS — M25462 Effusion, left knee: Secondary | ICD-10-CM | POA: Diagnosis not present

## 2019-11-23 DIAGNOSIS — M25462 Effusion, left knee: Secondary | ICD-10-CM | POA: Diagnosis not present

## 2019-11-23 DIAGNOSIS — M6281 Muscle weakness (generalized): Secondary | ICD-10-CM | POA: Diagnosis not present

## 2019-11-29 DIAGNOSIS — M25562 Pain in left knee: Secondary | ICD-10-CM | POA: Diagnosis not present

## 2019-11-29 DIAGNOSIS — M25561 Pain in right knee: Secondary | ICD-10-CM | POA: Diagnosis not present

## 2019-12-01 ENCOUNTER — Ambulatory Visit: Payer: Medicare PPO | Attending: Internal Medicine

## 2019-12-01 DIAGNOSIS — Z23 Encounter for immunization: Secondary | ICD-10-CM

## 2019-12-01 NOTE — Progress Notes (Signed)
   Covid-19 Vaccination Clinic  Name:  [name]    MRN: [id number] DOB: [birth year]  12/01/2019  [name] was observed post Covid-19 immunization for 15 minutes without incidence. She was provided with Vaccine Information Sheet and instruction to access the V-Safe system.   [name] was instructed to call 911 with any severe reactions post vaccine: Marland Kitchen Difficulty breathing  . Swelling of your face and throat  . A fast heartbeat  . A bad rash all over your body  . Dizziness and weakness    Immunizations Administered    Name Date Dose VIS Date Route   Pfizer COVID-19 Vaccine 12/01/2019 11:51 AM 0.3 mL 10/01/2019 Intramuscular   Manufacturer: [place]   Lot: [id number]   [place]: [id number]

## 2019-12-06 DIAGNOSIS — M25462 Effusion, left knee: Secondary | ICD-10-CM | POA: Diagnosis not present

## 2019-12-06 DIAGNOSIS — M25661 Stiffness of right knee, not elsewhere classified: Secondary | ICD-10-CM | POA: Diagnosis not present

## 2019-12-06 DIAGNOSIS — M6281 Muscle weakness (generalized): Secondary | ICD-10-CM | POA: Diagnosis not present

## 2019-12-13 DIAGNOSIS — M25462 Effusion, left knee: Secondary | ICD-10-CM | POA: Diagnosis not present

## 2019-12-13 DIAGNOSIS — M6281 Muscle weakness (generalized): Secondary | ICD-10-CM | POA: Diagnosis not present

## 2019-12-13 DIAGNOSIS — M25661 Stiffness of right knee, not elsewhere classified: Secondary | ICD-10-CM | POA: Diagnosis not present

## 2019-12-15 DIAGNOSIS — M6281 Muscle weakness (generalized): Secondary | ICD-10-CM | POA: Diagnosis not present

## 2019-12-15 DIAGNOSIS — M25661 Stiffness of right knee, not elsewhere classified: Secondary | ICD-10-CM | POA: Diagnosis not present

## 2019-12-15 DIAGNOSIS — M25462 Effusion, left knee: Secondary | ICD-10-CM | POA: Diagnosis not present

## 2019-12-16 DIAGNOSIS — M6281 Muscle weakness (generalized): Secondary | ICD-10-CM | POA: Diagnosis not present

## 2019-12-16 DIAGNOSIS — M25661 Stiffness of right knee, not elsewhere classified: Secondary | ICD-10-CM | POA: Diagnosis not present

## 2019-12-16 DIAGNOSIS — M25462 Effusion, left knee: Secondary | ICD-10-CM | POA: Diagnosis not present

## 2019-12-27 ENCOUNTER — Other Ambulatory Visit: Payer: Self-pay | Admitting: Family Medicine

## 2019-12-27 NOTE — Telephone Encounter (Signed)
ERx 

## 2019-12-27 NOTE — Telephone Encounter (Signed)
Electronic refill request Librax Last office visit 03/02/19 Last refill 08/27/19 #180

## 2020-01-10 DIAGNOSIS — M25562 Pain in left knee: Secondary | ICD-10-CM | POA: Diagnosis not present

## 2020-01-10 DIAGNOSIS — M25561 Pain in right knee: Secondary | ICD-10-CM | POA: Diagnosis not present

## 2020-01-11 ENCOUNTER — Encounter: Payer: Self-pay | Admitting: Family Medicine

## 2020-01-12 ENCOUNTER — Encounter: Payer: Self-pay | Admitting: Family Medicine

## 2020-01-12 ENCOUNTER — Other Ambulatory Visit: Payer: Self-pay

## 2020-01-12 ENCOUNTER — Ambulatory Visit: Payer: Medicare PPO | Admitting: Family Medicine

## 2020-01-12 VITALS — BP 120/88 | HR 90 | Temp 97.2°F | Ht 64.5 in | Wt 164.3 lb

## 2020-01-12 DIAGNOSIS — R1032 Left lower quadrant pain: Secondary | ICD-10-CM | POA: Diagnosis not present

## 2020-01-12 LAB — CBC WITH DIFFERENTIAL/PLATELET
Basophils Absolute: 0 10*3/uL (ref 0.0–0.1)
Basophils Relative: 0.3 % (ref 0.0–3.0)
Eosinophils Absolute: 0.2 10*3/uL (ref 0.0–0.7)
Eosinophils Relative: 2.5 % (ref 0.0–5.0)
HCT: 38.7 % (ref 36.0–46.0)
Hemoglobin: 13 g/dL (ref 12.0–15.0)
Lymphocytes Relative: 24.1 % (ref 12.0–46.0)
Lymphs Abs: 2.2 10*3/uL (ref 0.7–4.0)
MCHC: 33.5 g/dL (ref 30.0–36.0)
MCV: 90.3 fl (ref 78.0–100.0)
Monocytes Absolute: 0.5 10*3/uL (ref 0.1–1.0)
Monocytes Relative: 6 % (ref 3.0–12.0)
Neutro Abs: 6 10*3/uL (ref 1.4–7.7)
Neutrophils Relative %: 67.1 % (ref 43.0–77.0)
Platelets: 317 10*3/uL (ref 150.0–400.0)
RBC: 4.28 Mil/uL (ref 3.87–5.11)
RDW: 13.3 % (ref 11.5–15.5)
WBC: 9 10*3/uL (ref 4.0–10.5)

## 2020-01-12 LAB — COMPREHENSIVE METABOLIC PANEL
ALT: 20 U/L (ref 0–35)
AST: 27 U/L (ref 0–37)
Albumin: 4.2 g/dL (ref 3.5–5.2)
Alkaline Phosphatase: 56 U/L (ref 39–117)
BUN: 11 mg/dL (ref 6–23)
CO2: 28 mEq/L (ref 19–32)
Calcium: 9.4 mg/dL (ref 8.4–10.5)
Chloride: 100 mEq/L (ref 96–112)
Creatinine, Ser: 0.88 mg/dL (ref 0.40–1.20)
GFR: 63.71 mL/min (ref >60.00)
Glucose, Bld: 145 mg/dL — ABNORMAL HIGH (ref 70–99)
Potassium: 3.9 mEq/L (ref 3.5–5.1)
Sodium: 134 mEq/L — ABNORMAL LOW (ref 135–145)
Total Bilirubin: 0.4 mg/dL (ref 0.2–1.2)
Total Protein: 6.8 g/dL (ref 6.0–8.3)

## 2020-01-12 LAB — POCT URINALYSIS DIP (MANUAL ENTRY)
Bilirubin, UA: NEGATIVE
Glucose, UA: NEGATIVE mg/dL
Ketones, POC UA: NEGATIVE mg/dL
Leukocytes, UA: NEGATIVE
Nitrite, UA: NEGATIVE
Protein Ur, POC: NEGATIVE mg/dL
Spec Grav, UA: 1.02 (ref 1.010–1.025)
Urobilinogen, UA: 0.2 E.U./dL
pH, UA: 6 (ref 5.0–8.0)

## 2020-01-12 MED ORDER — CIPROFLOXACIN HCL 500 MG PO TABS: 500.0000 mg | ORAL_TABLET | Freq: Two times a day (BID) | ORAL | 0 refills | Status: DC

## 2020-01-12 MED ORDER — ONDANSETRON 4 MG PO TBDP: 4.0000 mg | ORAL_TABLET | Freq: Three times a day (TID) | ORAL | 0 refills | Status: DC | PRN

## 2020-01-12 MED ORDER — METRONIDAZOLE 500 MG PO TABS: 500.0000 mg | ORAL_TABLET | Freq: Three times a day (TID) | ORAL | 0 refills | Status: DC

## 2020-01-12 NOTE — Assessment & Plan Note (Signed)
Symptoms concerning for diverticulitis (last treatment for presumed diverticulitis was 02/2019). Treat as such with cipro/flagyl (augmentin allergy) 10d course. Reviewed dietary recommendations while she's recovering. Red flags to seek urgent care reviewed. Update if not improving as expected to consider CT.

## 2020-01-12 NOTE — Progress Notes (Addendum)
This visit was conducted in person.  BP 120/88 (BP Location: Left Arm, Patient Position: Sitting, Cuff Size: Normal)   Pulse 90   Temp (!) 97.2 F (36.2 C) (Temporal)   Ht 5' 4.5" (1.638 m)   Wt 164 lb 4.8 oz (74.5 kg)   SpO2 96%   BMI 27.77 kg/m    CC: abd pain Subjective:    Patient ID: Shelby Vazquez, female    DOB: 11-01-1950, 69 y.o.   MRN: [id number]  HPI: Shelby Vazquez is a 69 y.o. female presenting on 01/12/2020 for Abdominal Pain (Pt c/o abd pain x5day.  Nausea, gas mucus in stools.)   5d h/o lower abd pain, bloating, nausea, loose stools with mucous (some trouble controlling), then more recently alternating with constipation. Decreased appetite. This doesn't feel like typical IBS flare.  Managing with heating pad, ibuprofen, imodium and GasX.   No fevers/chills, urinary changes of dysuria urgency or incomplete emptying, vomiting. No blood in stool.   Last similar episode 02/2019 - treated for diverticulitis with benefit. At that time strawberries brought it on.   This time has eaten some raspberries and wonders if this is cause.  Completed COVID vaccines!  Very involved with grandchildren.  [place] planning [place] go [place] trip - recommend defer this trip given current symptomatology.      Relevant past medical, surgical, family and social history reviewed and updated as indicated. Interim medical history since our last visit reviewed. Allergies and medications reviewed and updated. Outpatient Medications Prior to Visit  Medication Sig Dispense Refill  . aspirin 81 MG tablet Take 1 tablet (81 mg total) by mouth daily. 30 tablet   . B-D 3CC LUER-LOK SYR 25GX1" 25G X 1" 3 ML MISC USE AS DIRECTED WITH  CYANOCOBALAMIN EVERY 30  DAYS 12 each 0  . Calcium Carbonate-Vitamin D 600-200 MG-UNIT CAPS Take 1 capsule by mouth daily.    . clidinium-chlordiazePOXIDE (LIBRAX) 5-2.5 MG capsule TAKE 1 CAPSULE BY MOUTH TWICE DAILY 60 capsule 1  . cyanocobalamin  (,VITAMIN B-12,) 1000 MCG/ML injection INJECT INTRAMUSCULARLY 1ML  EVERY 30 DAYS (DISCARD 28  DAYS AFTER FIRST USE.) 3 mL 3  . famotidine (PEPCID) 40 MG tablet TAKE 2 TABLETS BY MOUTH AT  BEDTIME 180 tablet 3  . ibuprofen (ADVIL,MOTRIN) 200 MG tablet Take 600 mg by mouth every 6 (six) hours as needed for moderate pain.    Marland Kitchen loratadine (CLARITIN) 10 MG tablet Take 10 mg by mouth daily.     . methenamine (HIPREX) 1 g tablet Take 1 g by mouth 2 (two) times daily with a meal.   1  . metoprolol tartrate (LOPRESSOR) 50 MG tablet Take 1 tablet (50 mg total) by mouth 2 (two) times daily. MAY TAKE AN EXTRA 25 MG to 50 MG  Tablet for palp. And/or blood pressures 70 tablet 6  . polyethylene glycol powder (MIRALAX) powder Take 1 Container by mouth. As needed    . PRESCRIPTION MEDICATION Apply topically 2 (two) times daily. Estradiol Progesterone cream 0.05-30 mg/ml    . Probiotic Product (PROBIOTIC PO) Take 2 capsules by mouth 2 (two) times daily.     . RABEprazole (ACIPHEX) 20 MG tablet Take 20 mg by mouth daily.    . rosuvastatin (CRESTOR) 20 MG tablet TAKE 1 TABLET(20 MG) BY MOUTH DAILY 90 tablet 3  . sucralfate (CARAFATE) 1 GM/10ML suspension take 10 milliliters by mouth four times a day if needed 420 mL 0  . venlafaxine  XR (EFFEXOR-XR) 75 MG 24 hr capsule TAKE 1 CAPSULE BY MOUTH EVERY DAY WITH BREAKFAST 90 capsule 1  . acetaminophen (TYLENOL) 500 MG tablet Take 1 tablet (500 mg total) by mouth 2 (two) times a day.    . ondansetron (ZOFRAN ODT) 4 MG disintegrating tablet Take 1 tablet (4 mg total) by mouth every 8 (eight) hours as needed for nausea or vomiting. 20 tablet 0   No facility-administered medications prior to visit.     Per HPI unless specifically indicated in ROS section below Review of Systems Objective:    BP 120/88 (BP Location: Left Arm, Patient Position: Sitting, Cuff Size: Normal)   Pulse 90   Temp (!) 97.2 F (36.2 C) (Temporal)   Ht 5' 4.5" (1.638 m)   Wt 164 lb 4.8 oz (74.5  kg)   SpO2 96%   BMI 27.77 kg/m   Wt Readings from Last 3 Encounters:  01/12/20 164 lb 4.8 oz (74.5 kg)  07/12/19 160 lb 6.4 oz (72.8 kg)  03/02/19 160 lb (72.6 kg)    Physical Exam Vitals and nursing note reviewed.  Constitutional:      Appearance: Normal appearance. She is not ill-appearing.  Eyes:     Extraocular Movements: Extraocular movements intact.     Conjunctiva/sclera: Conjunctivae normal.     Pupils: Pupils are equal, round, and reactive to light.  Cardiovascular:     Rate and Rhythm: Normal rate and regular rhythm.     Pulses: Normal pulses.     Heart sounds: Normal heart sounds. No murmur.  Pulmonary:     Effort: Pulmonary effort is normal. No respiratory distress.     Breath sounds: Normal breath sounds. No wheezing, rhonchi or rales.  Abdominal:     General: Abdomen is flat. Bowel sounds are normal. There is no distension.     Palpations: Abdomen is soft. There is no mass.     Tenderness: There is abdominal tenderness (moderate) in the left lower quadrant. There is no right CVA tenderness, left CVA tenderness, guarding or rebound.     Hernia: No hernia is present. There is no hernia in the umbilical area.  Skin:    General: Skin is warm and dry.     Findings: No rash.  Neurological:     Mental Status: She is alert.  Psychiatric:        Mood and Affect: Mood normal.        Behavior: Behavior normal.       Results for orders placed or performed in visit on 01/12/20  Comprehensive metabolic panel  Result Value Ref Range   Sodium 134 (L) 135 - 145 mEq/L   Potassium 3.9 3.5 - 5.1 mEq/L   Chloride 100 96 - 112 mEq/L   CO2 28 19 - 32 mEq/L   Glucose, Bld 145 (H) 70 - 99 mg/dL   BUN 11 6 - 23 mg/dL   Creatinine, Ser 0.88 0.40 - 1.20 mg/dL   Total Bilirubin 0.4 0.2 - 1.2 mg/dL   Alkaline Phosphatase 56 39 - 117 U/L   AST 27 0 - 37 U/L   ALT 20 0 - 35 U/L   Total Protein 6.8 6.0 - 8.3 g/dL   Albumin 4.2 3.5 - 5.2 g/dL   GFR 63.71 >60.00 mL/min   Calcium  9.4 8.4 - 10.5 mg/dL  CBC with Differential/Platelet  Result Value Ref Range   WBC 9.0 4.0 - 10.5 K/uL   RBC 4.28 3.87 - 5.11 Mil/uL  Hemoglobin 13.0 12.0 - 15.0 g/dL   HCT 38.7 36.0 - 46.0 %   MCV 90.3 78.0 - 100.0 fl   MCHC 33.5 30.0 - 36.0 g/dL   RDW 13.3 11.5 - 15.5 %   Platelets 317.0 150.0 - 400.0 K/uL   Neutrophils Relative % 67.1 43.0 - 77.0 %   Lymphocytes Relative 24.1 12.0 - 46.0 %   Monocytes Relative 6.0 3.0 - 12.0 %   Eosinophils Relative 2.5 0.0 - 5.0 %   Basophils Relative 0.3 0.0 - 3.0 %   Neutro Abs 6.0 1.4 - 7.7 K/uL   Lymphs Abs 2.2 0.7 - 4.0 K/uL   Monocytes Absolute 0.5 0.1 - 1.0 K/uL   Eosinophils Absolute 0.2 0.0 - 0.7 K/uL   Basophils Absolute 0.0 0.0 - 0.1 K/uL  POCT urinalysis dipstick  Result Value Ref Range   Color, UA straw (A) yellow   Clarity, UA clear clear   Glucose, UA negative negative mg/dL   Bilirubin, UA negative negative   Ketones, POC UA negative negative mg/dL   Spec Grav, UA 1.020 1.010 - 1.025   Blood, UA     pH, UA 6.0 5.0 - 8.0   Protein Ur, POC negative negative mg/dL   Urobilinogen, UA 0.2 0.2 or 1.0 E.U./dL   Nitrite, UA Negative Negative   Leukocytes, UA Negative Negative    Assessment & Plan:  This visit occurred during the SARS-CoV-2 public health emergency.  Safety protocols were in place, including screening questions prior to the visit, additional usage of staff PPE, and extensive cleaning of exam room while observing appropriate contact time as indicated for disinfecting solutions.   Problem List Items Addressed This Visit    LLQ abdominal pain - Primary    Symptoms concerning for diverticulitis (last treatment for presumed diverticulitis was 02/2019). Treat as such with cipro/flagyl (augmentin allergy) 10d course. Reviewed dietary recommendations while she's recovering. Red flags to seek urgent care reviewed. Update if not improving as expected to consider CT.       Relevant Orders   Comprehensive metabolic panel  (Completed)   CBC with Differential/Platelet (Completed)   POCT urinalysis dipstick (Completed)       Meds ordered this encounter  Medications  . ondansetron (ZOFRAN ODT) 4 MG disintegrating tablet    Sig: Take 1 tablet (4 mg total) by mouth every 8 (eight) hours as needed for nausea or vomiting.    Dispense:  20 tablet    Refill:  0  . ciprofloxacin (CIPRO) 500 MG tablet    Sig: Take 1 tablet (500 mg total) by mouth 2 (two) times daily.    Dispense:  20 tablet    Refill:  0  . metroNIDAZOLE (FLAGYL) 500 MG tablet    Sig: Take 1 tablet (500 mg total) by mouth 3 (three) times daily.    Dispense:  30 tablet    Refill:  0   Orders Placed This Encounter  Procedures  . Comprehensive metabolic panel  . CBC with Differential/Platelet  . POCT urinalysis dipstick    Patient instructions: Concern for diverticulitis again - treat with cipro and flagyl antibiotics.  Labs today and urinalysis today Let us know if not improving with treatment Liquid diet for 1-2 days then transition to bland for another 2-3 days then advance diet as tolerated.   Follow up plan: Return if symptoms worsen or fail to improve.  Ria Bush, MD

## 2020-01-12 NOTE — Patient Instructions (Addendum)
Concern for diverticulitis again - treat with cipro and flagyl antibiotics.  Labs today and urinalysis today Let us know if not improving with treatment Liquid diet for 1-2 days then transition to bland for another 2-3 days then advance diet as tolerated.   Diverticulitis  Diverticulitis is infection or inflammation of small pouches (diverticula) in the colon that form due to a condition called diverticulosis. Diverticula can trap stool (feces) and bacteria, causing infection and inflammation. Diverticulitis may cause severe stomach pain and diarrhea. It may lead to tissue damage in the colon that causes bleeding. The diverticula may also burst (rupture) and cause infected stool to enter other areas of the abdomen. Complications of diverticulitis can include:  Bleeding.  Severe infection.  Severe pain.  Rupture (perforation) of the colon.  Blockage (obstruction) of the colon. What are the causes? This condition is caused by stool becoming trapped in the diverticula, which allows bacteria to grow in the diverticula. This leads to inflammation and infection. What increases the risk? You are more likely to develop this condition if:  You have diverticulosis. The risk for diverticulosis increases if: ? You are overweight or obese. ? You use tobacco products. ? You do not get enough exercise.  You eat a diet that does not include enough fiber. High-fiber foods include fruits, vegetables, beans, nuts, and whole grains. What are the signs or symptoms? Symptoms of this condition may include:  Pain and tenderness in the abdomen. The pain is normally located on the left side of the abdomen, but it may occur in other areas.  Fever and chills.  Bloating.  Cramping.  Nausea.  Vomiting.  Changes in bowel routines.  Blood in your stool. How is this diagnosed? This condition is diagnosed based on:  Your medical history.  A physical exam.  Tests to make sure there is nothing  else causing your condition. These tests may include: ? Blood tests. ? Urine tests. ? Imaging tests of the abdomen, including X-rays, ultrasounds, MRIs, or CT scans. How is this treated? Most cases of this condition are mild and can be treated at home. Treatment may include:  Taking over-the-counter pain medicines.  Following a clear liquid diet.  Taking antibiotic medicines by mouth.  Rest. More severe cases may need to be treated at a hospital. Treatment may include:  Not eating or drinking.  Taking prescription pain medicine.  Receiving antibiotic medicines through an IV tube.  Receiving fluids and nutrition through an IV tube.  Surgery. When your condition is under control, your health care provider may recommend that you have a colonoscopy. This is an exam to look at the entire large intestine. During the exam, a lubricated, bendable tube is inserted into the anus and then passed into the rectum, colon, and other parts of the large intestine. A colonoscopy can show how severe your diverticula are and whether something else may be causing your symptoms. Follow these instructions at home: Medicines  Take over-the-counter and prescription medicines only as told by your health care provider. These include fiber supplements, probiotics, and stool softeners.  If you were prescribed an antibiotic medicine, take it as told by your health care provider. Do not stop taking the antibiotic even if you start to feel better.  Do not drive or use heavy machinery while taking prescription pain medicine. General instructions   Follow a full liquid diet or another diet as directed by your health care provider. After your symptoms improve, your health care provider may tell you  to change your diet. He or she may recommend that you eat a diet that contains at least 25 g (25 grams) of fiber daily. Fiber makes it easier to pass stool. Healthy sources of fiber include: ? Berries. One cup contains  4-8 grams of fiber. ? Beans or lentils. One half cup contains 5-8 grams of fiber. ? Green vegetables. One cup contains 4 grams of fiber.  Exercise for at least 30 minutes, 3 times each week. You should exercise hard enough to raise your heart rate and break a sweat.  Keep all follow-up visits as told by your health care provider. This is important. You may need a colonoscopy. Contact a health care provider if:  Your pain does not improve.  You have a hard time drinking or eating food.  Your bowel movements do not return to normal. Get help right away if:  Your pain gets worse.  Your symptoms do not get better with treatment.  Your symptoms suddenly get worse.  You have a fever.  You vomit more than one time.  You have stools that are bloody, black, or tarry. Summary  Diverticulitis is infection or inflammation of small pouches (diverticula) in the colon that form due to a condition called diverticulosis. Diverticula can trap stool (feces) and bacteria, causing infection and inflammation.  You are at higher risk for this condition if you have diverticulosis and you eat a diet that does not include enough fiber.  Most cases of this condition are mild and can be treated at home. More severe cases may need to be treated at a hospital.  When your condition is under control, your health care provider may recommend that you have an exam called a colonoscopy. This exam can show how severe your diverticula are and whether something else may be causing your symptoms. This information is not intended to replace advice given to you by your health care provider. Make sure you discuss any questions you have with your health care provider. Document Revised: 09/19/2017 Document Reviewed: 11/09/2016 Elsevier Patient Education  2020 [employer].

## 2020-01-12 NOTE — Telephone Encounter (Signed)
Please schedule in person visit for today - 10:15pm, 12:15pm or 1:30pm

## 2020-01-12 NOTE — Addendum Note (Signed)
Addended by: Darral Dash on: 01/12/2020 11:11 AM   Modules accepted: Orders

## 2020-01-12 NOTE — Telephone Encounter (Signed)
Patient called in about my chart message.  She is not sure what she should do at this point. Patient said she had this type of problems back in April and thinks it may be diverticulitis/Diverticulosis Patient stated she is still having the abd pain and diarrhea but the diarrhea comes and goes. But she is still having a lot of nausea  She stated she did not want to go to an UC.   Please advise

## 2020-01-18 ENCOUNTER — Telehealth: Payer: Self-pay | Admitting: Family Medicine

## 2020-01-18 DIAGNOSIS — K529 Noninfective gastroenteritis and colitis, unspecified: Secondary | ICD-10-CM

## 2020-01-18 DIAGNOSIS — R1032 Left lower quadrant pain: Secondary | ICD-10-CM

## 2020-01-18 DIAGNOSIS — R1030 Lower abdominal pain, unspecified: Secondary | ICD-10-CM

## 2020-01-18 NOTE — Telephone Encounter (Signed)
Spoke with pt relaying Dr. Synthia Innocent message.  Pt verbalizes understanding.  States stool is more "mucousy" than watery.  She has some firmer stool but has mucous mixed in it.  Pt agrees to stool test for C diff.

## 2020-01-18 NOTE — Telephone Encounter (Signed)
Patient called the office today She was in to see you on 3/24 for Abd pain. Nausea and Diarrhea.  She stated that she has been taking the 2 medications you prescribed. Things seem to be improving a little but she is still experiencing the same symptoms and mucus like diarrhea  She is also having frequent spasm. Patient stated this has been going on all this morning. She would like to know what the next step should be .  Please advise

## 2020-01-18 NOTE — Telephone Encounter (Signed)
Would have her try librax for diarrhea (I believe she has this at home). If watery diarrhea, would offer she come in for stool test for C diff.

## 2020-01-19 ENCOUNTER — Ambulatory Visit
Admission: RE | Admit: 2020-01-19 | Discharge: 2020-01-19 | Disposition: A | Discharge status: Home / Self Care | Payer: Medicare PPO | Source: Ambulatory Visit | Attending: Family Medicine | Admitting: Family Medicine

## 2020-01-19 DIAGNOSIS — R1032 Left lower quadrant pain: Secondary | ICD-10-CM | POA: Diagnosis not present

## 2020-01-19 DIAGNOSIS — R1030 Lower abdominal pain, unspecified: Secondary | ICD-10-CM

## 2020-01-19 DIAGNOSIS — R197 Diarrhea, unspecified: Secondary | ICD-10-CM | POA: Diagnosis not present

## 2020-01-19 MED ORDER — IOPAMIDOL (ISOVUE-300) INJECTION 61%
100.0000 mL | Freq: Once | INTRAVENOUS | Status: AC | PRN
  Administered 2020-01-19: 100 mL via INTRAVENOUS

## 2020-01-19 NOTE — Addendum Note (Signed)
Addended by: Ria Bush on: 01/19/2020 10:46 AM   Modules accepted: Orders

## 2020-01-19 NOTE — Telephone Encounter (Signed)
Spoke with pt relaying Dr. Synthia Innocent message.  Pt verbalizes understanding and states Shelby Vazquez has already contacted her about CT scan.

## 2020-01-19 NOTE — Telephone Encounter (Signed)
If not watery, don't recommend C diff testing at this time. I would recommend CT scan abd/pelvis with contrast for further evaluation of ongoing abd pain with mucous and diarrhea. I have ordered - see if she agrees to this. We may need tor defer back to GI for further eval if not improving.  Lab Results  Component Value Date   CREATININE 0.88 01/12/2020   BUN 11 01/12/2020   NA 134 (L) 01/12/2020   K 3.9 01/12/2020   CL 100 01/12/2020   CO2 28 01/12/2020

## 2020-01-20 NOTE — Addendum Note (Signed)
Addended by: Ria Bush on: 01/20/2020 03:51 PM   Modules accepted: Orders

## 2020-01-20 NOTE — Telephone Encounter (Signed)
GI referral placed for ongoing colitis despite abx treatment.

## 2020-01-21 ENCOUNTER — Encounter: Payer: Self-pay | Admitting: Family Medicine

## 2020-01-25 DIAGNOSIS — K5792 Diverticulitis of intestine, part unspecified, without perforation or abscess without bleeding: Secondary | ICD-10-CM | POA: Diagnosis not present

## 2020-01-28 ENCOUNTER — Telehealth: Payer: Self-pay

## 2020-01-28 NOTE — Telephone Encounter (Signed)
Received fax from [place] stating Librax 5-2.5 mg cap is not covered by pt's plan.  Preferred alternative is loperamide, dicyclomine HCl or omeprazole.

## 2020-01-28 NOTE — Telephone Encounter (Signed)
Noted. Will submit PA  

## 2020-01-28 NOTE — Telephone Encounter (Signed)
Pt left v/m requesting status of PA or letter of exception for clidinium-chlordiazepoxide. Pt request cb today.

## 2020-01-28 NOTE — Telephone Encounter (Addendum)
Has failed aciphex alone, symptoms not controlled with omeprazole, effexor, pepcid, loperamide or dicyclomine alone.

## 2020-01-31 NOTE — Telephone Encounter (Signed)
Patient called back in regards to her medication  She stated she is completely out and wanted to talk to the nurse.   Advised that as of this morning the Decision was pending.  Patient asked if you could call her

## 2020-01-31 NOTE — Telephone Encounter (Signed)
Returned pt's call.  She stated again that she is out of her Librax as of yesterday.  I explained the prior authorization has been submitted to her ins co with the additional notes from Dr. Darnell Level.  And we are waiting for their decision and I will contact her as soon as I hear something.  Pt verbalizes understanding.

## 2020-01-31 NOTE — Telephone Encounter (Signed)
Submitted PA; keyLoyal Gambler, PA case ID:  [id number].  Decision pending.

## 2020-02-01 NOTE — Telephone Encounter (Signed)
Received PA approval, valid until 10/20/2020.  Made pharmacy aware and states pt has picked up med.

## 2020-02-04 DIAGNOSIS — R339 Retention of urine, unspecified: Secondary | ICD-10-CM | POA: Diagnosis not present

## 2020-02-07 DIAGNOSIS — H903 Sensorineural hearing loss, bilateral: Secondary | ICD-10-CM | POA: Diagnosis not present

## 2020-02-14 DIAGNOSIS — R198 Other specified symptoms and signs involving the digestive system and abdomen: Secondary | ICD-10-CM | POA: Diagnosis not present

## 2020-02-14 DIAGNOSIS — R1032 Left lower quadrant pain: Secondary | ICD-10-CM | POA: Diagnosis not present

## 2020-02-26 ENCOUNTER — Other Ambulatory Visit: Payer: Self-pay | Admitting: Family Medicine

## 2020-02-28 ENCOUNTER — Telehealth: Payer: Self-pay

## 2020-02-28 NOTE — Telephone Encounter (Signed)
Pt reports pharmacy said she needed a PA for Librex monthly and that she was not due for a refill until 5/12 because she picked up the last one on 4/12. Advised this nurse will f/u with pharmacy and call her back. Pt appreciative and verbalized understanding.  Contacted pharmacy and spoke with Leanne who reports last fill was 4/12 so it cannot be filled again until 5/12. No exceptions since it is a controlled substance. She also reports that it does NOT need another PA but it does need a refill. They have sent a request and it has been received and forwarded to the provider.  Contacted pt and advised. Pt was not happy with pharmacy but appreciative of call. Pt verbalized understanding.

## 2020-02-28 NOTE — Telephone Encounter (Signed)
ERx 

## 2020-02-28 NOTE — Telephone Encounter (Signed)
[facility] Night - Client Nonclinical Telephone Record  [facility] Client [name] - Client Client Site [place] Physician Ria Bush - MD Contact Type Call Who Is Calling Patient / Member / Family / Caregiver Caller Name Tekesha Wery Caller Phone Number [phone] Patient Name Shelby Vazquez Patient DOB April 21, 1951 Call Type Message Only Information Provided Reason for Call Request for General Office Information Initial Comment Caller states she is needing to get a prior auth approval for her insurance. Additional Comment Caller declined RN triage. Provided caller with office hours from profile. Disp. Time Disposition Final User 02/26/2020 10:57:36 AM General Information Provided Yes Hassie Bruce Call Closed By: Hassie Bruce Transaction Date/Time: 02/26/2020 10:52:55 AM (ET)

## 2020-02-28 NOTE — Telephone Encounter (Signed)
Last OV 01/12/20 Next OV none scheduled Last refill sent in 12/27/19, #60, 1 RF

## 2020-03-07 ENCOUNTER — Other Ambulatory Visit: Payer: Self-pay | Admitting: Family Medicine

## 2020-03-07 NOTE — Telephone Encounter (Signed)
E-scribed refill.  Plz schedule wellness and lab visits.

## 2020-03-08 ENCOUNTER — Telehealth: Payer: Self-pay | Admitting: Family Medicine

## 2020-03-08 NOTE — Telephone Encounter (Signed)
Called patient and left voicemail for patient to call office to schedule CPE, AWV and labs.

## 2020-03-14 ENCOUNTER — Other Ambulatory Visit: Payer: Self-pay | Admitting: Cardiology

## 2020-03-17 ENCOUNTER — Other Ambulatory Visit: Payer: Self-pay | Admitting: Cardiology

## 2020-03-24 ENCOUNTER — Ambulatory Visit (INDEPENDENT_AMBULATORY_CARE_PROVIDER_SITE_OTHER): Payer: Medicare PPO

## 2020-03-24 DIAGNOSIS — Z0000 Encounter for general adult medical examination without abnormal findings: Secondary | ICD-10-CM | POA: Diagnosis not present

## 2020-03-24 NOTE — Progress Notes (Signed)
PCP notes:  Health Maintenance: Mammogram- due, will check with her GYN   Abnormal Screenings: none   Patient concerns: Wants her hemoglobin level checked   Nurse concerns: none   Next PCP appt.: 04/18/2020 @ 2:15 pm

## 2020-03-24 NOTE — Progress Notes (Signed)
Subjective:   Shelby Vazquez is a 69 y.o. female who presents for Medicare Annual (Subsequent) preventive examination.  Review of Systems: N/A   I connected with the patient today by telephone and verified that I am speaking with the correct person using two identifiers. Location patient: home Location nurse: work Persons participating in the virtual visit: patient, Marine scientist.   I discussed the limitations, risks, security and privacy concerns of performing an evaluation and management service by telephone and the availability of in person appointments. I also discussed with the patient that there may be a patient responsible charge related to this service. The patient expressed understanding and verbally consented to this telephonic visit.    Interactive audio and video telecommunications were attempted between this nurse and patient, however failed, due to patient having technical difficulties OR patient did not have access to video capability.  We continued and completed visit with audio only.     Cardiac Risk Factors include: advanced age (>10men, >17 women);diabetes mellitus;hypertension     Objective:     Vitals: There were no vitals taken for this visit.  There is no height or weight on file to calculate BMI.  Advanced Directives 03/24/2020 02/24/2019 07/16/2017 01/15/2017 03/24/2014 02/08/2014  Does Patient Have a Medical Advance Directive? No No No No Patient does not have advance directive;Patient would not like information Patient does not have advance directive  Would patient like information on creating a medical advance directive? No - Patient declined No - Patient declined No - Patient declined No - Patient declined - -    Tobacco Social History   Tobacco Use  Smoking Status Never Smoker  Smokeless Tobacco Never Used     Counseling given: Not Answered   Clinical Intake:  Pre-visit preparation completed: Yes  Pain : 0-10 Pain Score: 5  Pain Type: Chronic pain Pain  Location: Abdomen Pain Descriptors / Indicators: Cramping Pain Onset: More than a month ago Pain Frequency: Intermittent     Nutritional Risks: Nausea/ vomitting/ diarrhea(diarrhea sometimes) Diabetes: Yes CBG done?: No Did pt. bring in CBG monitor from home?: No  How often do you need to have someone help you when you read instructions, pamphlets, or other written materials from your doctor or pharmacy?: 1 - Never What is the last grade level you completed in school?: bachelors  Interpreter Needed?: No  Information entered by :: CJohnson, LPN  Past Medical History:  Diagnosis Date  . Abdominal aortic atherosclerosis ([place]) 10/2014   by CT scan  . B12 deficiency   . Chronic anxiety   . Chronic cough    cyclical cough syndrome  . Complication of anesthesia 9 yrs ago   awake and in severe pain with colonoscopy  . Concussion   . Diet-controlled diabetes mellitus ([place]) 02/22/2017   New dx 02/2017  . GERD (gastroesophageal reflux disease)    severe leading to chronic cough and hoarseness  . Hyperlipidemia   . Hypertension   . IBS (irritable bowel syndrome)    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, PB-8 probiotic, healthy diet and exercise  . 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 Osteopenia 01/2015   T -0.8 spine, -1.7 hip  . Pyelonephritis due to Escherichia coli 07/16/2017  . Uterine prolapse   . Vocal cord paralysis, bilateral partial 2001  evaluated at [facility] - did not require surgery   Past Surgical History:  Procedure Laterality Date  . BUNIONECTOMY     [place] '2000  . CATARACT EXTRACTION     right 01/2010, left may 2011  . COLONOSCOPY  03/2018   diverticulosis, rpt 5 yrs ([name])  . COLONOSCOPY WITH PROPOFOL N/A 03/31/2014   sessile serrated adenomas, sigmoid diverticulosis, rpt 3 yrs Cleotis Nipper,  MD)  . Coronary Calcium Score/CTA  09/2017   Calcium score 53 . Normal right dominant coronary artery system with mild onset of CAD. Aneurysmal dilation of the ascending aorta with maximum diameter diameter of 4.0-3.8 centimeter. -Recommend annual CT/MRA December 2018  . DILATION AND CURETTAGE OF UTERUS  '77   for molar pregnancy excision  . ESOPHAGOGASTRODUODENOSCOPY N/A 03/31/2014   mult gastric polyps, mild mucosal hemorrhages; ESOPHAGOGASTRODUODENOSCOPY (EGD);  Surgeon: Cleotis Nipper, MD  . ESOPHAGOGASTRODUODENOSCOPY ENDOSCOPY  april 2014  . INTERSTIM IMPLANT PLACEMENT  2018  . toe surgery Right 09/2018  . TRANSTHORACIC ECHOCARDIOGRAM  09/2017   Normal LV size and function. EF 55-60%. GR 1 DD. No regional wall motion abnormalities. No significant valvular abnormalities.   Family History  Problem Relation Age of Onset  . Cancer Father        Esophagus  . Heart disease Mother 25  . Hyperlipidemia Mother   . Hypertension Mother   . Heart attack Mother 24  . Cancer Maternal Aunt        breast  . Heart attack Maternal Grandmother   . Heart attack Maternal Grandfather   . Heart disease Paternal Grandmother   . Heart disease Paternal Grandfather   . Hyperlipidemia Brother   . Hypertension Brother   . Diabetes Neg Hx    Social History   Socioeconomic History  . Marital status: Married    Spouse name: Not on file  . Number of children: 2  . Years of education: 58  . Highest education level: Not on file  Occupational History  . Occupation: Product manager: Psychologist, sport and exercise [employer]  Tobacco Use  . Smoking status: Never Smoker  . Smokeless tobacco: Never Used  Substance and Sexual Activity  . Alcohol use: Yes    Comment: twice a year  . Drug use: No  . Sexual activity: Yes    Partners: Male  Other Topics Concern  . Not on file  Social History Narrative   Married 1973. 2 daughters '74, '80; 5 grandchildren.    No h/o physical or sexual abuse. 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    Edu: [employer].    Activity: stays active.   Diet: good water, fruits/vegetables daily   Social Determinants of Health   Financial Resource Strain: Low Risk   . Difficulty of Paying Living Expenses: Not hard at all  Food Insecurity: No Food Insecurity  . Worried About Charity fundraiser in the Last Year: Never true  . Ran Out of Food in the Last Year: Never true  Transportation Needs: No Transportation Needs  . Lack of Transportation (Medical): No  . Lack of Transportation (Non-Medical): No  Physical Activity: Insufficiently Active  . Days of Exercise per Week: 1 day  . Minutes of Exercise per Session: 60 min  Stress: No Stress Concern Present  . Feeling of Stress : Only a little  Social Connections:   . Frequency of Communication with Friends and Family:   .  Frequency of Social Gatherings with Friends and Family:   . Attends Religious Services:   . Active Member of Clubs or Organizations:   . Attends Archivist Meetings:   Marland Kitchen Marital Status:     Outpatient Encounter Medications as of 03/24/2020  Medication Sig  . acetaminophen (TYLENOL) 500 MG tablet Take 1 tablet (500 mg total) by mouth 2 (two) times a day.  Marland Kitchen aspirin 81 MG tablet Take 1 tablet (81 mg total) by mouth daily.  . B-D 3CC LUER-LOK SYR 25GX1" 25G X 1" 3 ML MISC USE AS DIRECTED WITH  CYANOCOBALAMIN EVERY 30  DAYS  . Calcium Carbonate-Vitamin D 600-200 MG-UNIT CAPS Take 1 capsule by mouth daily.  . ciprofloxacin (CIPRO) 500 MG tablet Take 1 tablet (500 mg total) by mouth 2 (two) times daily.  . clidinium-chlordiazePOXIDE (LIBRAX) 5-2.5 MG capsule TAKE 1 CAPSULE BY MOUTH TWICE DAILY  . cyanocobalamin (,VITAMIN B-12,) 1000 MCG/ML injection INJECT INTRAMUSCULARLY 1ML  EVERY 30 DAYS (DISCARD 28  DAYS AFTER FIRST USE.)  . famotidine (PEPCID) 40 MG tablet TAKE 2 TABLETS BY MOUTH AT  BEDTIME  . ibuprofen (ADVIL,MOTRIN) 200 MG tablet  Take 600 mg by mouth every 6 (six) hours as needed for moderate pain.  Marland Kitchen loratadine (CLARITIN) 10 MG tablet Take 10 mg by mouth daily.   . methenamine (HIPREX) 1 g tablet Take 1 g by mouth 2 (two) times daily with a meal.   . metoprolol tartrate (LOPRESSOR) 50 MG tablet TAKE 1 TABLET BY MOUTH TWICE DAILY  . metroNIDAZOLE (FLAGYL) 500 MG tablet Take 1 tablet (500 mg total) by mouth 3 (three) times daily.  . ondansetron (ZOFRAN ODT) 4 MG disintegrating tablet Take 1 tablet (4 mg total) by mouth every 8 (eight) hours as needed for nausea or vomiting.  . polyethylene glycol powder (MIRALAX) powder Take 1 Container by mouth. As needed  . PRESCRIPTION MEDICATION Apply topically 2 (two) times daily. Estradiol Progesterone cream 0.05-30 mg/ml  . Probiotic Product (PROBIOTIC PO) Take 2 capsules by mouth 2 (two) times daily.   . RABEprazole (ACIPHEX) 20 MG tablet Take 20 mg by mouth daily.  . rosuvastatin (CRESTOR) 20 MG tablet TAKE 1 TABLET(20 MG) BY MOUTH DAILY  . sucralfate (CARAFATE) 1 GM/10ML suspension take 10 milliliters by mouth four times a day if needed  . venlafaxine XR (EFFEXOR-XR) 75 MG 24 hr capsule TAKE 1 CAPSULE BY MOUTH EVERY DAY WITH BREAKFAST   No facility-administered encounter medications on file as of 03/24/2020.    Activities of Daily Living In your present state of health, do you have any difficulty performing the following activities: 03/24/2020  Hearing? Y  Comment wears hearing aids  Vision? N  Difficulty concentrating or making decisions? Y  Comment has memory loss issues  Walking or climbing stairs? N  Dressing or bathing? N  Doing errands, shopping? N  Preparing Food and eating ? N  Using the Toilet? N  In the past six months, have you accidently leaked urine? N  Do you have problems with loss of bowel control? N  Managing your Medications? N  Managing your Finances? N  Housekeeping or managing your Housekeeping? N  Some recent data might be hidden    Patient Care  Team: Ria Bush, MD as PCP - General (Family Medicine) Dian Queen, MD (Obstetrics and Gynecology) Elsie Stain, MD as Attending Physician (Pulmonary Disease) Ronald Lobo, MD as Referring Physician (Gastroenterology) Jodi Marble, MD as Attending Physician (Otolaryngology)    Assessment:  This is a routine wellness examination for Joyceline.  Exercise Activities and Dietary recommendations Current Exercise Habits: Structured exercise class, Type of exercise: yoga, Time (Minutes): 60, Frequency (Times/Week): 1, Weekly Exercise (Minutes/Week): 60, Intensity: Mild, Exercise limited by: None identified  Goals    . Patient Stated     Starting 02/24/19, I will continue to take medications as prescribed.     . Patient Stated     03/24/2020, I will continue to do yoga 1 night a week with my daughter for 1 hour.        Fall Risk Fall Risk  03/24/2020 02/24/2019 02/18/2018 02/17/2017 02/12/2016  Falls in the past year? 0 1 No Yes Yes  Comment - fall with injury to buttocks; seen in ED - - -  Number falls in past yr: 0 0 - 2 or more 2 or more  Injury with Fall? 0 1 - Yes No  Risk Factor Category  - - - High Fall Risk -  Risk for fall due to : Medication side effect - - - Other (Comment)  Risk for fall due to: Comment - - - - Due to having to wear larger shoes due to foot issues  Follow up Falls evaluation completed;Falls prevention discussed - - Falls prevention discussed -   Is the patient's home free of loose throw rugs in walkways, pet beds, electrical cords, etc?   yes      Grab bars in the bathroom? no      Handrails on the stairs?   yes      Adequate lighting?   yes  Timed Get Up and Go performed: N/A  Depression Screen PHQ 2/9 Scores 03/24/2020 02/24/2019 02/18/2018 02/17/2017  PHQ - 2 Score 0 0 0 2  PHQ- 9 Score 0 0 - 10     Cognitive Function MMSE - Mini Mental State Exam 03/24/2020 02/24/2019  Not completed: Refused -  Orientation to time - 5  Orientation to Place - 5   Registration - 3  Attention/ Calculation - 0  Recall - 3  Language- name 2 objects - 0  Language- repeat - 1  Language- follow 3 step command - 0  Language- read & follow direction - 0  Write a sentence - 0  Copy design - 0  Total score - 17  Mini Cog  Mini-Cog screen was not completed. Patient refused. Maximum score is 22. A value of 0 denotes this part of the MMSE was not completed or the patient failed this part of the Mini-Cog screening.       Immunization History  Administered Date(s) Administered  . Fluad Quad(high Dose 65+) 08/19/2019  . Influenza Split 08/21/2012  . Influenza Whole 07/25/2009, 08/29/2014  . Influenza, High Dose Seasonal PF 09/12/2017, 08/20/2018  . Influenza,inj,Quad PF,6+ Mos 07/23/2015, 07/23/2016  . Influenza,inj,Quad PF,6-35 Mos 07/24/2013  . PFIZER SARS-COV-2 Vaccination 11/10/2019, 12/01/2019  . Pneumococcal Conjugate-13 02/12/2016  . Pneumococcal Polysaccharide-23 01/20/2013, 08/20/2018  . Td 06/09/2010  . Tdap 07/23/2015  . Zoster 04/28/2013    Qualifies for Shingles Vaccine: Yes   Screening Tests Health Maintenance  Topic Date Due  . DTAP VACCINES (1) 03/09/1951  . HEMOGLOBIN A1C  08/28/2019  . FOOT EXAM  01/06/2020  . URINE MICROALBUMIN  02/25/2020  . INFLUENZA VACCINE  05/21/2020  . MAMMOGRAM  09/09/2020  . OPHTHALMOLOGY EXAM  10/06/2020  . DTaP/Tdap/Td (3 - Td) 07/22/2025  . TETANUS/TDAP  07/22/2025  . COLONOSCOPY  04/17/2028  . DEXA SCAN  Completed  .  COVID-19 Vaccine  Completed  . Hepatitis C Screening  Completed  . PNA vac Low Risk Adult  Completed    Cancer Screenings: Lung: Low Dose CT Chest recommended if Age 68-80 years, 30 pack-year currently smoking OR have quit w/in 15 years. Patient does not qualify. Breast:  Up to date on Mammogram: No, due    Bone Density/Dexa: completed 01/27/2017 Colorectal: completed 04/17/2018  Additional Screenings:  Hepatitis C Screening: 02/07/2016     Plan:    Patient will  continue to do yoga 1 night a week with her daughter for 1 hour.    I have personally reviewed and noted the following in the patient's chart:   . Medical and social history . Use of alcohol, tobacco or illicit drugs  . Current medications and supplements . Functional ability and status . Nutritional status . Physical activity . Advanced directives . List of other physicians . Hospitalizations, surgeries, and ER visits in previous 12 months . Vitals . Screenings to include cognitive, depression, and falls . Referrals and appointments  In addition, I have reviewed and discussed with patient certain preventive protocols, quality metrics, and best practice recommendations. A written personalized care plan for preventive services as well as general preventive health recommendations were provided to patient.     Andrez Grime, LPN  [id number]/[id number]

## 2020-03-24 NOTE — Patient Instructions (Signed)
Shelby Vazquez , Thank you for taking time to come for your Medicare Wellness Visit. I appreciate your ongoing commitment to your health goals. Please review the following plan we discussed and let me know if I can assist you in the future.   Screening recommendations/referrals: Colonoscopy: Up to date, completed 04/17/2018 Mammogram: due Bone Density: completed 01/27/2017 Recommended yearly ophthalmology/optometry visit for glaucoma screening and checkup Recommended yearly dental visit for hygiene and checkup  Vaccinations: Influenza vaccine: Up to date, completed 08/19/2019 Pneumococcal vaccine: Completed series Tdap vaccine: Up to date, completed 07/23/2015 Shingles vaccine: discussed    Advanced directives: Advance directive discussed with you today. Even though you declined this today please call our office should you change your mind and we can give you the proper paperwork for you to fill out.   Conditions/risks identified: diabetes, hypertension  Next appointment: 04/18/2020 @ 2:15 pm    Preventive Care 65 Years and Older, Female Preventive care refers to lifestyle choices and visits with your health care provider that can promote health and wellness. What does preventive care include?  A yearly physical exam. This is also called an annual well check.  Dental exams once or twice a year.  Routine eye exams. Ask your health care provider how often you should have your eyes checked.  Personal lifestyle choices, including:  Daily care of your teeth and gums.  Regular physical activity.  Eating a healthy diet.  Avoiding tobacco and drug use.  Limiting alcohol use.  Practicing safe sex.  Taking low-dose aspirin every day.  Taking vitamin and mineral supplements as recommended by your health care provider. What happens during an annual well check? The services and screenings done by your health care provider during your annual well check will depend on your age, overall  health, lifestyle risk factors, and family history of disease. Counseling  Your health care provider may ask you questions about your:  Alcohol use.  Tobacco use.  Drug use.  Emotional well-being.  Home and relationship well-being.  Sexual activity.  Eating habits.  History of falls.  Memory and ability to understand (cognition).  Work and work Statistician.  Reproductive health. Screening  You may have the following tests or measurements:  Height, weight, and BMI.  Blood pressure.  Lipid and cholesterol levels. These may be checked every 5 years, or more frequently if you are over 29 years old.  Skin check.  Lung cancer screening. You may have this screening every year starting at age 53 if you have a 30-pack-year history of smoking and currently smoke or have quit within the past 15 years.  Fecal occult blood test (FOBT) of the stool. You may have this test every year starting at age 75.  Flexible sigmoidoscopy or colonoscopy. You may have a sigmoidoscopy every 5 years or a colonoscopy every 10 years starting at age 31.  Hepatitis C blood test.  Hepatitis B blood test.  Sexually transmitted disease (STD) testing.  Diabetes screening. This is done by checking your blood sugar (glucose) after you have not eaten for a while (fasting). You may have this done every 1-3 years.  Bone density scan. This is done to screen for osteoporosis. You may have this done starting at age 52.  Mammogram. This may be done every 1-2 years. Talk to your health care provider about how often you should have regular mammograms. Talk with your health care provider about your test results, treatment options, and if necessary, the need for more tests. Vaccines  Your health care provider may recommend certain vaccines, such as:  Influenza vaccine. This is recommended every year.  Tetanus, diphtheria, and acellular pertussis (Tdap, Td) vaccine. You may need a Td booster every 10  years.  Zoster vaccine. You may need this after age 65.  Pneumococcal 13-valent conjugate (PCV13) vaccine. One dose is recommended after age 26.  Pneumococcal polysaccharide (PPSV23) vaccine. One dose is recommended after age 60. Talk to your health care provider about which screenings and vaccines you need and how often you need them. This information is not intended to replace advice given to you by your health care provider. Make sure you discuss any questions you have with your health care provider. Document Released: 11/03/2015 Document Revised: 06/26/2016 Document Reviewed: 08/08/2015 Elsevier Interactive Patient Education  2017 [place] Prevention in the Home Falls can cause injuries. They can happen to people of all ages. There are many things you can do to make your home safe and to help prevent falls. What can I do on the outside of my home?  Regularly fix the edges of walkways and driveways and fix any cracks.  Remove anything that might make you trip as you walk through a door, such as a raised step or threshold.  Trim any bushes or trees on the path to your home.  Use bright outdoor lighting.  Clear any walking paths of anything that might make someone trip, such as rocks or tools.  Regularly check to see if handrails are loose or broken. Make sure that both sides of any steps have handrails.  Any raised decks and porches should have guardrails on the edges.  Have any leaves, snow, or ice cleared regularly.  Use sand or salt on walking paths during winter.  Clean up any spills in your garage right away. This includes oil or grease spills. What can I do in the bathroom?  Use night lights.  Install grab bars by the toilet and in the tub and shower. Do not use towel bars as grab bars.  Use non-skid mats or decals in the tub or shower.  If you need to sit down in the shower, use a plastic, non-slip stool.  Keep the floor dry. Clean up any water that  spills on the floor as soon as it happens.  Remove soap buildup in the tub or shower regularly.  Attach bath mats securely with double-sided non-slip rug tape.  Do not have throw rugs and other things on the floor that can make you trip. What can I do in the bedroom?  Use night lights.  Make sure that you have a light by your bed that is easy to reach.  Do not use any sheets or blankets that are too big for your bed. They should not hang down onto the floor.  Have a firm chair that has side arms. You can use this for support while you get dressed.  Do not have throw rugs and other things on the floor that can make you trip. What can I do in the kitchen?  Clean up any spills right away.  Avoid walking on wet floors.  Keep items that you use a lot in easy-to-reach places.  If you need to reach something above you, use a strong step stool that has a grab bar.  Keep electrical cords out of the way.  Do not use floor polish or wax that makes floors slippery. If you must use wax, use non-skid floor wax.  Do  not have throw rugs and other things on the floor that can make you trip. What can I do with my stairs?  Do not leave any items on the stairs.  Make sure that there are handrails on both sides of the stairs and use them. Fix handrails that are broken or loose. Make sure that handrails are as long as the stairways.  Check any carpeting to make sure that it is firmly attached to the stairs. Fix any carpet that is loose or worn.  Avoid having throw rugs at the top or bottom of the stairs. If you do have throw rugs, attach them to the floor with carpet tape.  Make sure that you have a light switch at the top of the stairs and the bottom of the stairs. If you do not have them, ask someone to add them for you. What else can I do to help prevent falls?  Wear shoes that:  Do not have high heels.  Have rubber bottoms.  Are comfortable and fit you well.  Are closed at the  toe. Do not wear sandals.  If you use a stepladder:  Make sure that it is fully opened. Do not climb a closed stepladder.  Make sure that both sides of the stepladder are locked into place.  Ask someone to hold it for you, if possible.  Clearly mark and make sure that you can see:  Any grab bars or handrails.  First and last steps.  Where the edge of each step is.  Use tools that help you move around (mobility aids) if they are needed. These include:  Canes.  Walkers.  Scooters.  Crutches.  Turn on the lights when you go into a dark area. Replace any light bulbs as soon as they burn out.  Set up your furniture so you have a clear path. Avoid moving your furniture around.  If any of your floors are uneven, fix them.  If there are any pets around you, be aware of where they are.  Review your medicines with your doctor. Some medicines can make you feel dizzy. This can increase your chance of falling. Ask your doctor what other things that you can do to help prevent falls. This information is not intended to replace advice given to you by your health care provider. Make sure you discuss any questions you have with your health care provider. Document Released: 08/03/2009 Document Revised: 03/14/2016 Document Reviewed: 11/11/2014 Elsevier Interactive Patient Education  2017 [employer].

## 2020-03-28 ENCOUNTER — Other Ambulatory Visit: Payer: Self-pay | Admitting: Cardiology

## 2020-03-28 DIAGNOSIS — K581 Irritable bowel syndrome with constipation: Secondary | ICD-10-CM | POA: Diagnosis not present

## 2020-03-28 DIAGNOSIS — R1032 Left lower quadrant pain: Secondary | ICD-10-CM | POA: Diagnosis not present

## 2020-03-31 ENCOUNTER — Other Ambulatory Visit: Payer: Self-pay | Admitting: Cardiology

## 2020-04-12 ENCOUNTER — Other Ambulatory Visit: Payer: Self-pay | Admitting: Family Medicine

## 2020-04-12 DIAGNOSIS — E782 Mixed hyperlipidemia: Secondary | ICD-10-CM

## 2020-04-12 DIAGNOSIS — E611 Iron deficiency: Secondary | ICD-10-CM

## 2020-04-12 DIAGNOSIS — I10 Essential (primary) hypertension: Secondary | ICD-10-CM

## 2020-04-12 DIAGNOSIS — E119 Type 2 diabetes mellitus without complications: Secondary | ICD-10-CM

## 2020-04-12 DIAGNOSIS — E538 Deficiency of other specified B group vitamins: Secondary | ICD-10-CM

## 2020-04-13 ENCOUNTER — Other Ambulatory Visit: Payer: Self-pay

## 2020-04-13 ENCOUNTER — Other Ambulatory Visit (INDEPENDENT_AMBULATORY_CARE_PROVIDER_SITE_OTHER): Payer: Medicare PPO

## 2020-04-13 DIAGNOSIS — E782 Mixed hyperlipidemia: Secondary | ICD-10-CM

## 2020-04-13 DIAGNOSIS — E119 Type 2 diabetes mellitus without complications: Secondary | ICD-10-CM

## 2020-04-13 DIAGNOSIS — I10 Essential (primary) hypertension: Secondary | ICD-10-CM

## 2020-04-13 DIAGNOSIS — E611 Iron deficiency: Secondary | ICD-10-CM

## 2020-04-13 DIAGNOSIS — E538 Deficiency of other specified B group vitamins: Secondary | ICD-10-CM

## 2020-04-13 LAB — BASIC METABOLIC PANEL
BUN: 16 mg/dL (ref 6–23)
CO2: 26 mEq/L (ref 19–32)
Calcium: 9.5 mg/dL (ref 8.4–10.5)
Chloride: 109 mEq/L (ref 96–112)
Creatinine, Ser: 0.85 mg/dL (ref 0.40–1.20)
GFR: 66.26 mL/min (ref >60.00)
Glucose, Bld: 100 mg/dL — ABNORMAL HIGH (ref 70–99)
Potassium: 3.7 mEq/L (ref 3.5–5.1)
Sodium: 137 mEq/L (ref 135–145)

## 2020-04-13 LAB — VITAMIN B12: Vitamin B-12: 935 pg/mL — ABNORMAL HIGH (ref 211–911)

## 2020-04-13 LAB — MICROALBUMIN / CREATININE URINE RATIO
Creatinine,U: 67.3 mg/dL
Microalb Creat Ratio: 1 mg/g (ref 0.0–30.0)
Microalb, Ur: <0.7 mg/dL (ref 0.0–1.9)

## 2020-04-13 LAB — LIPID PANEL
Cholesterol: 120 mg/dL (ref 0–200)
HDL: 38.4 mg/dL — ABNORMAL LOW (ref >39.00)
LDL Cholesterol: 58 mg/dL (ref 0–99)
NonHDL: 81.47
Total CHOL/HDL Ratio: 3
Triglycerides: 117 mg/dL (ref 0.0–149.0)
VLDL: 23.4 mg/dL (ref 0.0–40.0)

## 2020-04-13 LAB — IBC PANEL
Iron: 61 ug/dL (ref 42–145)
Saturation Ratios: 12 % — ABNORMAL LOW (ref 20.0–50.0)
Transferrin: 363 mg/dL — ABNORMAL HIGH (ref 212.0–360.0)

## 2020-04-13 LAB — HEMOGLOBIN A1C: Hgb A1c MFr Bld: 6.5 % (ref 4.6–6.5)

## 2020-04-13 LAB — FERRITIN: Ferritin: 9.4 ng/mL — ABNORMAL LOW (ref 10.0–291.0)

## 2020-04-18 ENCOUNTER — Encounter: Payer: Self-pay | Admitting: Family Medicine

## 2020-04-18 ENCOUNTER — Other Ambulatory Visit: Payer: Self-pay

## 2020-04-18 ENCOUNTER — Ambulatory Visit (INDEPENDENT_AMBULATORY_CARE_PROVIDER_SITE_OTHER): Payer: Medicare PPO | Admitting: Family Medicine

## 2020-04-18 VITALS — BP 132/80 | HR 76 | Temp 97.8°F | Ht 63.5 in | Wt 156.4 lb

## 2020-04-18 DIAGNOSIS — K582 Mixed irritable bowel syndrome: Secondary | ICD-10-CM | POA: Diagnosis not present

## 2020-04-18 DIAGNOSIS — E782 Mixed hyperlipidemia: Secondary | ICD-10-CM

## 2020-04-18 DIAGNOSIS — Z0000 Encounter for general adult medical examination without abnormal findings: Secondary | ICD-10-CM

## 2020-04-18 DIAGNOSIS — M8580 Other specified disorders of bone density and structure, unspecified site: Secondary | ICD-10-CM

## 2020-04-18 DIAGNOSIS — K219 Gastro-esophageal reflux disease without esophagitis: Secondary | ICD-10-CM | POA: Diagnosis not present

## 2020-04-18 DIAGNOSIS — H9193 Unspecified hearing loss, bilateral: Secondary | ICD-10-CM

## 2020-04-18 DIAGNOSIS — F411 Generalized anxiety disorder: Secondary | ICD-10-CM

## 2020-04-18 DIAGNOSIS — Z7189 Other specified counseling: Secondary | ICD-10-CM

## 2020-04-18 DIAGNOSIS — Z636 Dependent relative needing care at home: Secondary | ICD-10-CM

## 2020-04-18 DIAGNOSIS — I700 Atherosclerosis of aorta: Secondary | ICD-10-CM | POA: Diagnosis not present

## 2020-04-18 DIAGNOSIS — I10 Essential (primary) hypertension: Secondary | ICD-10-CM

## 2020-04-18 DIAGNOSIS — F331 Major depressive disorder, recurrent, moderate: Secondary | ICD-10-CM

## 2020-04-18 DIAGNOSIS — E538 Deficiency of other specified B group vitamins: Secondary | ICD-10-CM | POA: Diagnosis not present

## 2020-04-18 DIAGNOSIS — E611 Iron deficiency: Secondary | ICD-10-CM

## 2020-04-18 DIAGNOSIS — E119 Type 2 diabetes mellitus without complications: Secondary | ICD-10-CM

## 2020-04-18 MED ORDER — VENLAFAXINE HCL ER 75 MG PO CP24: 75.0000 mg | ORAL_CAPSULE | Freq: Every day | ORAL | 3 refills | Status: DC

## 2020-04-18 MED ORDER — ROSUVASTATIN CALCIUM 20 MG PO TABS: ORAL_TABLET | ORAL | 3 refills | Status: DC

## 2020-04-18 MED ORDER — FAMOTIDINE 40 MG PO TABS: 80.0000 mg | ORAL_TABLET | Freq: Every day | ORAL | 3 refills | Status: DC

## 2020-04-18 MED ORDER — FERROUS SULFATE 325 (65 FE) MG PO TABS: 325.0000 mg | ORAL_TABLET | Freq: Every day | ORAL | Status: DC

## 2020-04-18 MED ORDER — CYANOCOBALAMIN 1000 MCG/ML IJ SOLN: 1000.0000 ug | INTRAMUSCULAR | 3 refills | Status: DC

## 2020-04-18 NOTE — Assessment & Plan Note (Signed)
Chronic, stable off meds.  

## 2020-04-18 NOTE — Assessment & Plan Note (Signed)
Stable period on effexor 75mg  daily.

## 2020-04-18 NOTE — Assessment & Plan Note (Signed)
Continue monthly B12 replacement.

## 2020-04-18 NOTE — Assessment & Plan Note (Signed)
Significant stress caring for disabled brother.

## 2020-04-18 NOTE — Patient Instructions (Addendum)
We will request records from Dr Christen Butter office on latest bone density scan and mammogram.  If interested, check with pharmacy about new 2 shot shingles series (shingrix).  Work on Financial controller.  Start iron pill daily 325mg  (65FE) daily. Let me know if any trouble tolerating iron - we could consider iron infusion.  Return as needed or in 1 year for next physical. Return in 6 months for iron level check.   Health Maintenance After Age 69 After age 69, you are at a higher risk for certain long-term diseases and infections as well as injuries from falls. Falls are a major cause of broken bones and head injuries in people who are older than age 69. Getting regular preventive care can help to keep you healthy and well. Preventive care includes getting regular testing and making lifestyle changes as recommended by your health care provider. Talk with your health care provider about:  Which screenings and tests you should have. A screening is a test that checks for a disease when you have no symptoms.  A diet and exercise plan that is right for you. What should I know about screenings and tests to prevent falls? Screening and testing are the best ways to find a health problem early. Early diagnosis and treatment give you the best chance of managing medical conditions that are common after age 69. Certain conditions and lifestyle choices may make you more likely to have a fall. Your health care provider may recommend:  Regular vision checks. Poor vision and conditions such as cataracts can make you more likely to have a fall. If you wear glasses, make sure to get your prescription updated if your vision changes.  Medicine review. Work with your health care provider to regularly review all of the medicines you are taking, including over-the-counter medicines. Ask your health care provider about any side effects that may make you more likely to have a fall. Tell your health care provider if any  medicines that you take make you feel dizzy or sleepy.  Osteoporosis screening. Osteoporosis is a condition that causes the bones to get weaker. This can make the bones weak and cause them to break more easily.  Blood pressure screening. Blood pressure changes and medicines to control blood pressure can make you feel dizzy.  Strength and balance checks. Your health care provider may recommend certain tests to check your strength and balance while standing, walking, or changing positions.  Foot health exam. Foot pain and numbness, as well as not wearing proper footwear, can make you more likely to have a fall.  Depression screening. You may be more likely to have a fall if you have a fear of falling, feel emotionally low, or feel unable to do activities that you used to do.  Alcohol use screening. Using too much alcohol can affect your balance and may make you more likely to have a fall. What actions can I take to lower my risk of falls? General instructions  Talk with your health care provider about your risks for falling. Tell your health care provider if: ? You fall. Be sure to tell your health care provider about all falls, even ones that seem minor. ? You feel dizzy, sleepy, or off-balance.  Take over-the-counter and prescription medicines only as told by your health care provider. These include any supplements.  Eat a healthy diet and maintain a healthy weight. A healthy diet includes low-fat dairy products, low-fat (lean) meats, and fiber from whole grains, beans, and lots  of fruits and vegetables. Home safety  Remove any tripping hazards, such as rugs, cords, and clutter.  Install safety equipment such as grab bars in bathrooms and safety rails on stairs.  Keep rooms and walkways well-lit. Activity   Follow a regular exercise program to stay fit. This will help you maintain your balance. Ask your health care provider what types of exercise are appropriate for you.  If you  need a cane or walker, use it as recommended by your health care provider.  Wear supportive shoes that have nonskid soles. Lifestyle  Do not drink alcohol if your health care provider tells you not to drink.  If you drink alcohol, limit how much you have: ? 0-1 drink a day for women. ? 0-2 drinks a day for men.  Be aware of how much alcohol is in your drink. In the [place] one drink equals one typical bottle of beer (12 oz), one-half glass of wine (5 oz), or one shot of hard liquor (1 oz).  Do not use any products that contain nicotine or tobacco, such as cigarettes and e-cigarettes. If you need help quitting, ask your health care provider. Summary  Having a healthy lifestyle and getting preventive care can help to protect your health and wellness after age 69.  Screening and testing are the best way to find a health problem early and help you avoid having a fall. Early diagnosis and treatment give you the best chance for managing medical conditions that are more common for people who are older than age 69.  Falls are a major cause of broken bones and head injuries in people who are older than age 69. Take precautions to prevent a fall at home.  Work with your health care provider to learn what changes you can make to improve your health and wellness and to prevent falls. This information is not intended to replace advice given to you by your health care provider. Make sure you discuss any questions you have with your health care provider. Document Revised: 01/28/2019 Document Reviewed: 08/20/2017 Elsevier Patient Education  2020 [employer].

## 2020-04-18 NOTE — Assessment & Plan Note (Signed)
Now wears hearing aides.

## 2020-04-18 NOTE — Assessment & Plan Note (Signed)
Continue statin, aspirin 

## 2020-04-18 NOTE — Assessment & Plan Note (Signed)
Will request latest records

## 2020-04-18 NOTE — Assessment & Plan Note (Signed)
Chronic, stable. Continue current regimen. 

## 2020-04-18 NOTE — Assessment & Plan Note (Signed)
Stable period on effexor.  

## 2020-04-18 NOTE — Assessment & Plan Note (Signed)
Advanced directives: does not have this set up. Packet providedlast visit - encouraged to continue working on this. HCPOA would be daughter Shelby Vazquez.

## 2020-04-18 NOTE — Assessment & Plan Note (Signed)
Appreciate GI care - sees Dr Cristina Gong.

## 2020-04-18 NOTE — Assessment & Plan Note (Signed)
Chronic, stable. Continue current regimen. Goal LDL <100, ideally <70 per cardiology.  The ASCVD Risk score Shelby Bussing DC Jr., Shelby al., Shelby Vazquez) failed to calculate for the following reasons:   The valid total cholesterol range is 130 to 320 mg/dL

## 2020-04-18 NOTE — Assessment & Plan Note (Signed)
Continue aciphex through GI and pepcid BID.

## 2020-04-18 NOTE — Progress Notes (Signed)
This visit was conducted in person.  BP 132/80 (BP Location: Left Arm, Patient Position: Sitting, Cuff Size: Normal)   Pulse 76   Temp 97.8 F (36.6 C) (Temporal)   Ht 5' 3.5" (1.613 m)   Wt 156 lb 7 oz (71 kg)   SpO2 97%   BMI 27.28 kg/m    CC: CPE Subjective:    Patient ID: Shelby Vazquez, female    DOB: 16-Jan-1951, 69 y.o.   MRN: [id number]  HPI: Shelby Vazquez is a 69 y.o. female presenting on 04/18/2020 for Annual Exam (Prt 2. )   Saw health advisor earlier this month for medicare wellness visit. Note reviewed.    No exam data present    Clinical Support from 03/24/2020 in [place] at [facility]  PHQ-2 Total Score 0    Wears hearing aides connected to smart phone  Fall Risk  03/24/2020 02/24/2019 02/18/2018 02/17/2017 02/12/2016  Falls in the past year? 0 1 No Yes Yes  Comment - fall with injury to buttocks; seen in ED - - -  Number falls in past yr: 0 0 - 2 or more 2 or more  Injury with Fall? 0 1 - Yes No  Risk Factor Category  - - - High Fall Risk -  Risk for fall due to : Medication side effect - - - Other (Comment)  Risk for fall due to: Comment - - - - Due to having to wear larger shoes due to foot issues  Follow up Falls evaluation completed;Falls prevention discussed - - Falls prevention discussed -    Recent trip with husband to [place] and [place] for wedding anniversary.  Caregiver stress - she is brother's HCPOA who has TBI and h/o concussions.   IBS - seems to be some better on IBGard, also taking miralax daily. Lactose free diet. Librax has helped, effexor has helped.   Foot surgery 10/20/2018  Preventative: COLONOSCOPY WITH PROPOFOL Date: 03/31/2014 sessile serrated adenomas, sigmoid diverticulosis, rpt 3 yrs(Robert [name] V, MD) COLONOSCOPY 03/2018 - diverticulosis, rpt 5 yrs ([name]) ESOPHAGOGASTRODUODENOSCOPY Date: 03/31/2014 mult gastric polyps, mild mucosal hemorrhages; Cleotis Nipper, MD Well woman with Dr. 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 Sees yearly in September. Normal paps  DEXA4/2018osteopenia(Grewal)  Mammogram9/2019 WNL(Grewal) - thinks normal 12/2019  Flu shot yearly Pneumovax 01/2013, prevnar4/2017 COVID vaccine - completed [place] 11/2019 Td 2011, Tdap 2016 Zostavax 04/2013 shingrix discussed -discussed  Advanced directives: does not have this set up. Packet providedlast visit - encouraged to continue working on this. HCPOA would be daughter [name].  Seat belt use discussed  Sunscreen use discussed, no changing moles on skin  Non smoker Alcohol -none  Dentist - sees regularly Eye exam - yearly Bowel - no constipation, chronic IBS Bladder - no incontinence   Married 1973. 2 daughters '74, '80; 5 grandchildren.  No h/o physical or sexual abuse. 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  Edu: [employer] Activity: really enjoys hiking, now training for 5k Diet: good water, fruits/vegetables daily     Relevant past medical, surgical, family and social history reviewed and updated as indicated. Interim medical history since our last visit reviewed. Allergies and medications reviewed and updated. Outpatient Medications Prior to Visit  Medication Sig Dispense Refill  . acetaminophen (TYLENOL) 500 MG tablet Take 1 tablet (500 mg total) by mouth 2 (two) times a day.    Marland Kitchen aspirin 81 MG tablet Take 1  tablet (81 mg total) by mouth daily. 30 tablet   . B-D 3CC LUER-LOK SYR 25GX1" 25G X 1" 3 ML MISC USE AS DIRECTED WITH  CYANOCOBALAMIN EVERY 30  DAYS 12 each 0  . Calcium Carbonate-Vitamin D 600-200 MG-UNIT CAPS Take 1 capsule by mouth daily.    . clidinium-chlordiazePOXIDE (LIBRAX) 5-2.5 MG capsule TAKE 1 CAPSULE BY MOUTH TWICE DAILY 60 capsule 1  . ibuprofen (ADVIL,MOTRIN) 200 MG tablet Take 600 mg by mouth every 6 (six) hours as needed for moderate pain.    Marland Kitchen loratadine (CLARITIN) 10 MG tablet Take 10 mg by mouth  daily.     . methenamine (HIPREX) 1 g tablet Take 1 g by mouth 2 (two) times daily with a meal.   1  . metoprolol tartrate (LOPRESSOR) 50 MG tablet TAKE 1 TABLET(50 MG) BY MOUTH TWICE DAILY. MAY TAKE AN EXTRA 25 MG TO 50 MG TABLET FOR PALP.AND/ OR BLOOD PRESSURES 270 tablet 1  . ondansetron (ZOFRAN ODT) 4 MG disintegrating tablet Take 1 tablet (4 mg total) by mouth every 8 (eight) hours as needed for nausea or vomiting. 20 tablet 0  . Peppermint Oil (IBGARD PO)     . polyethylene glycol powder (MIRALAX) powder Take 1 Container by mouth daily. As needed     . PRESCRIPTION MEDICATION Apply topically 2 (two) times daily. Estradiol Progesterone cream 0.05-30 mg/ml    . Probiotic Product (PROBIOTIC PO) Take 2 capsules by mouth 2 (two) times daily.     . RABEprazole (ACIPHEX) 20 MG tablet Take 20 mg by mouth daily.    . sucralfate (CARAFATE) 1 GM/10ML suspension take 10 milliliters by mouth four times a day if needed 420 mL 0  . Vitamin B Complex-C CAPS     . cyanocobalamin (,VITAMIN B-12,) 1000 MCG/ML injection INJECT INTRAMUSCULARLY 1ML  EVERY 30 DAYS (DISCARD 28  DAYS AFTER FIRST USE.) 3 mL 3  . famotidine (PEPCID) 40 MG tablet TAKE 2 TABLETS BY MOUTH AT  BEDTIME 180 tablet 3  . rosuvastatin (CRESTOR) 20 MG tablet TAKE 1 TABLET(20 MG) BY MOUTH DAILY 90 tablet 0  . venlafaxine XR (EFFEXOR-XR) 75 MG 24 hr capsule TAKE 1 CAPSULE BY MOUTH EVERY DAY WITH BREAKFAST 90 capsule 1  . ciprofloxacin (CIPRO) 500 MG tablet Take 1 tablet (500 mg total) by mouth 2 (two) times daily. 20 tablet 0  . metroNIDAZOLE (FLAGYL) 500 MG tablet Take 1 tablet (500 mg total) by mouth 3 (three) times daily. 30 tablet 0   No facility-administered medications prior to visit.     Per HPI unless specifically indicated in ROS section below Review of Systems  Constitutional: Negative for activity change, appetite change, chills, fatigue, fever and unexpected weight change.  HENT: Negative for hearing loss.   Eyes: Negative for  visual disturbance.  Respiratory: Negative for cough, chest tightness, shortness of breath and wheezing.   Cardiovascular: Negative for chest pain, palpitations and leg swelling.  Gastrointestinal: Positive for constipation and diarrhea. Negative for abdominal distention, abdominal pain (IBS), blood in stool, nausea and vomiting.  Genitourinary: Negative for difficulty urinating and hematuria.  Musculoskeletal: Negative for arthralgias, myalgias and neck pain.  Skin: Negative for rash.  Neurological: Negative for dizziness, seizures, syncope and headaches.  Hematological: Negative for adenopathy. Does not bruise/bleed easily.  Psychiatric/Behavioral: Negative for dysphoric mood. The patient is nervous/anxious.    Objective:  BP 132/80 (BP Location: Left Arm, Patient Position: Sitting, Cuff Size: Normal)   Pulse 76   Temp 97.8 F (  36.6 C) (Temporal)   Ht 5' 3.5" (1.613 m)   Wt 156 lb 7 oz (71 kg)   SpO2 97%   BMI 27.28 kg/m   Wt Readings from Last 3 Encounters:  04/18/20 156 lb 7 oz (71 kg)  01/12/20 164 lb 4.8 oz (74.5 kg)  07/12/19 160 lb 6.4 oz (72.8 kg)      Physical Exam Vitals and nursing note reviewed.  Constitutional:      General: She is not in acute distress.    Appearance: Normal appearance. She is well-developed. She is not ill-appearing.  HENT:     Head: Normocephalic and atraumatic.     Right Ear: Hearing, tympanic membrane, ear canal and external ear normal.     Left Ear: Hearing, tympanic membrane, ear canal and external ear normal.     Ears:     Comments: Hearing aides removed to view canals Eyes:     General: No scleral icterus.    Extraocular Movements: Extraocular movements intact.     Conjunctiva/sclera: Conjunctivae normal.     Pupils: Pupils are equal, round, and reactive to light.  Neck:     Thyroid: No thyromegaly or thyroid tenderness.     Vascular: No carotid bruit.  Cardiovascular:     Rate and Rhythm: Normal rate and regular rhythm.      Pulses: Normal pulses.          Radial pulses are 2+ on the right side and 2+ on the left side.     Heart sounds: Normal heart sounds. No murmur heard.   Pulmonary:     Effort: Pulmonary effort is normal. No respiratory distress.     Breath sounds: Normal breath sounds. No wheezing, rhonchi or rales.  Abdominal:     General: Abdomen is flat. Bowel sounds are normal. There is no distension.     Palpations: Abdomen is soft. There is no mass.     Tenderness: There is no abdominal tenderness. There is no guarding or rebound.     Hernia: No hernia is present.  Musculoskeletal:        General: Normal range of motion.     Cervical back: Normal range of motion and neck supple.     Right lower leg: No edema.     Left lower leg: No edema.  Lymphadenopathy:     Cervical: No cervical adenopathy.  Skin:    General: Skin is warm and dry.     Findings: No rash.  Neurological:     General: No focal deficit present.     Mental Status: She is alert and oriented to person, place, and time.     Comments: CN grossly intact, station and gait intact  Psychiatric:        Mood and Affect: Mood normal.        Behavior: Behavior normal.        Thought Content: Thought content normal.        Judgment: Judgment normal.       Results for orders placed or performed in visit on 74/14/23  Basic metabolic panel  Result Value Ref Range   Sodium 137 135 - 145 mEq/L   Potassium 3.7 3.5 - 5.1 mEq/L   Chloride 109 96 - 112 mEq/L   CO2 26 19 - 32 mEq/L   Glucose, Bld 100 (H) 70 - 99 mg/dL   BUN 16 6 - 23 mg/dL   Creatinine, Ser 0.85 0.40 - 1.20 mg/dL   GFR 66.26 >60.00  mL/min   Calcium 9.5 8.4 - 10.5 mg/dL  IBC panel  Result Value Ref Range   Iron 61 42 - 145 ug/dL   Transferrin 363.0 (H) 212.0 - 360.0 mg/dL   Saturation Ratios 12.0 (L) 20.0 - 50.0 %  Ferritin  Result Value Ref Range   Ferritin 9.4 (L) 10.0 - 291.0 ng/mL  Hemoglobin A1c  Result Value Ref Range   Hgb A1c MFr Bld 6.5 4.6 - 6.5 %   Microalbumin / creatinine urine ratio  Result Value Ref Range   Microalb, Ur <0.7 0.0 - 1.9 mg/dL   Creatinine,U 67.3 mg/dL   Microalb Creat Ratio 1.0 0.0 - 30.0 mg/g  Vitamin B12  Result Value Ref Range   Vitamin B-12 935 (H) 211 - 911 pg/mL  Lipid panel  Result Value Ref Range   Cholesterol 120 0 - 200 mg/dL   Triglycerides 117.0 0 - 149 mg/dL   HDL 38.40 (L) >39.00 mg/dL   VLDL 23.4 0.0 - 40.0 mg/dL   LDL Cholesterol 58 0 - 99 mg/dL   Total CHOL/HDL Ratio 3    NonHDL 81.47    Assessment & Plan:  This visit occurred during the SARS-CoV-2 public health emergency.  Safety protocols were in place, including screening questions prior to the visit, additional usage of staff PPE, and extensive cleaning of exam room while observing appropriate contact time as indicated for disinfecting solutions.   Problem List Items Addressed This Visit    Vitamin B12 deficiency    Continue monthly B12 replacement.       Routine health maintenance - Primary    Preventative protocols reviewed and updated unless pt declined. Discussed healthy diet and lifestyle.       Osteopenia    Will request latest records      MDD (major depressive disorder), recurrent episode, moderate ([place])    Stable period on effexor.       Relevant Medications   venlafaxine XR (EFFEXOR-XR) 75 MG 24 hr capsule   Irritable bowel syndrome (IBS)    Appreciate GI care - sees Dr Cristina Gong.       Relevant Medications   famotidine (PEPCID) 40 MG tablet   Iron deficiency    Will restart iron tablet daily. Discussed option of iron infusion if needed or trouble tolerating oral iron.       Hyperlipidemia (Chronic)    Chronic, stable. Continue current regimen. Goal LDL <100, ideally <70 per cardiology.  The ASCVD Risk score Mikey Bussing DC Jr., et al., 2013) failed to calculate for the following reasons:   The valid total cholesterol range is 130 to 320 mg/dL       Relevant Medications   rosuvastatin (CRESTOR) 20 MG tablet    Hearing loss    Now wears hearing aides.       GERD, SEVERE    Continue aciphex through GI and pepcid BID.       Relevant Medications   famotidine (PEPCID) 40 MG tablet   GAD (generalized anxiety disorder)    Stable period on effexor 75mg  daily.       Relevant Medications   venlafaxine XR (EFFEXOR-XR) 75 MG 24 hr capsule   Essential hypertension (Chronic)    Chronic, stable. Continue current regimen.       Relevant Medications   rosuvastatin (CRESTOR) 20 MG tablet   Diet-controlled diabetes mellitus (HCC)    Chronic, stable off meds.       Relevant Medications   rosuvastatin (CRESTOR) 20 MG tablet  Caregiver stress    Significant stress caring for disabled brother.       Advanced care planning/counseling discussion    Advanced directives: does not have this set up. Packet providedlast visit - encouraged to continue working on this. HCPOA would be daughter [name].      Abdominal aortic atherosclerosis (HCC) (Chronic)    Continue statin, aspirin.       Relevant Medications   rosuvastatin (CRESTOR) 20 MG tablet       Meds ordered this encounter  Medications  . ferrous sulfate 325 (65 FE) MG tablet    Sig: Take 1 tablet (325 mg total) by mouth daily with breakfast.  . cyanocobalamin (,VITAMIN B-12,) 1000 MCG/ML injection    Sig: Inject 1 mL (1,000 mcg total) into the muscle every 30 (thirty) days.    Dispense:  3 mL    Refill:  3  . famotidine (PEPCID) 40 MG tablet    Sig: Take 2 tablets (80 mg total) by mouth at bedtime.    Dispense:  180 tablet    Refill:  3  . rosuvastatin (CRESTOR) 20 MG tablet    Sig: TAKE 1 TABLET(20 MG) BY MOUTH DAILY    Dispense:  90 tablet    Refill:  3  . venlafaxine XR (EFFEXOR-XR) 75 MG 24 hr capsule    Sig: Take 1 capsule (75 mg total) by mouth daily with breakfast.    Dispense:  90 capsule    Refill:  3   No orders of the defined types were placed in this encounter.  Patient instructions: We will request records  from Dr Christen Butter office on latest bone density scan and mammogram.  If interested, check with pharmacy about new 2 shot shingles series (shingrix).  Work on [employer].  Start iron pill daily 325mg  (65FE) daily. Let me know if any trouble tolerating iron - we could consider iron infusion.  Return as needed or in 1 year for next physical. Return in 6 months for iron level check.   Follow up plan: Return in about 1 year (around 04/18/2021) for annual exam, prior fasting for blood work.  Ria Bush, MD

## 2020-04-18 NOTE — Assessment & Plan Note (Signed)
Will restart iron tablet daily. Discussed option of iron infusion if needed or trouble tolerating oral iron.

## 2020-04-18 NOTE — Assessment & Plan Note (Signed)
Preventative protocols reviewed and updated unless pt declined. Discussed healthy diet and lifestyle.  

## 2020-04-26 ENCOUNTER — Encounter: Payer: Self-pay | Admitting: Family Medicine

## 2020-04-27 ENCOUNTER — Other Ambulatory Visit: Payer: Self-pay | Admitting: Family Medicine

## 2020-04-27 NOTE — Telephone Encounter (Signed)
Name of Medication: Librax Name of Pharmacy: [place] [facility] or Written Date and Quantity: 6//21, #60 Last Office Visit and Type: 04/18/20, AWV prt 2 Next Office Visit and Type: 04/20/21, AWV prt 2 Last Controlled Substance Agreement Date: none Last UDS: none

## 2020-04-29 NOTE — Telephone Encounter (Signed)
ERx 

## 2020-05-12 DIAGNOSIS — R339 Retention of urine, unspecified: Secondary | ICD-10-CM | POA: Diagnosis not present

## 2020-05-28 ENCOUNTER — Other Ambulatory Visit: Payer: Self-pay | Admitting: Family Medicine

## 2020-05-29 ENCOUNTER — Telehealth: Payer: Self-pay

## 2020-05-29 NOTE — Telephone Encounter (Signed)
Name of Medication: Librax Name of Pharmacy: [facility] Last Fill or Written Date and Quantity: 04/29/20, #60 Last Office Visit and Type: 04/18/20, AWV prt 2 Next Office Visit and Type: 04/20/21, AWV prt 2 Last Controlled Substance Agreement Date: none Last UDS: none

## 2020-05-29 NOTE — Telephone Encounter (Signed)
Error

## 2020-05-30 NOTE — Telephone Encounter (Signed)
plz notify this was sent in. 

## 2020-05-30 NOTE — Telephone Encounter (Signed)
Patient called and said she's out of medication.  Patient said her insurance company approved her medication through December. Patient said she still has to call monthly to get it approved.

## 2020-06-01 NOTE — Telephone Encounter (Signed)
Notified pt as instructed, by phn.  Verbalizes understanding.

## 2020-06-14 DIAGNOSIS — K581 Irritable bowel syndrome with constipation: Secondary | ICD-10-CM | POA: Diagnosis not present

## 2020-06-14 DIAGNOSIS — R1032 Left lower quadrant pain: Secondary | ICD-10-CM | POA: Diagnosis not present

## 2020-06-16 ENCOUNTER — Telehealth: Payer: Self-pay | Admitting: Family Medicine

## 2020-06-16 DIAGNOSIS — N3281 Overactive bladder: Secondary | ICD-10-CM | POA: Diagnosis not present

## 2020-06-16 DIAGNOSIS — E611 Iron deficiency: Secondary | ICD-10-CM

## 2020-06-16 DIAGNOSIS — N390 Urinary tract infection, site not specified: Secondary | ICD-10-CM | POA: Diagnosis not present

## 2020-06-16 DIAGNOSIS — R339 Retention of urine, unspecified: Secondary | ICD-10-CM | POA: Diagnosis not present

## 2020-06-16 DIAGNOSIS — R5383 Other fatigue: Secondary | ICD-10-CM

## 2020-06-16 NOTE — Telephone Encounter (Signed)
Labs ordered. Schedule lab visit at her convenience.  If persistent iron deficiency and pt desires we can set her up for iron infusion.

## 2020-06-16 NOTE — Telephone Encounter (Signed)
Patient called.  Patient said she has been taking iron supplements.  Patient said her body aches and she's exhausted.   Patient is requesting lab work be done to find out if this is why she's so exhausted. Patient said Dr.G had mentioned iron infusions or does she need to increase the dosage of her iron supplement?

## 2020-06-16 NOTE — Addendum Note (Signed)
Addended by: Ria Bush on: 06/16/2020 04:36 PM   Modules accepted: Orders

## 2020-06-16 NOTE — Telephone Encounter (Signed)
Spoke with pt relaying Dr. Synthia Innocent message.  Pt verbalizes understanding.  Lab visit scheduled for 06/19/20 at 11:45.

## 2020-06-19 ENCOUNTER — Other Ambulatory Visit (INDEPENDENT_AMBULATORY_CARE_PROVIDER_SITE_OTHER): Payer: Medicare PPO

## 2020-06-19 ENCOUNTER — Other Ambulatory Visit: Payer: Self-pay

## 2020-06-19 DIAGNOSIS — R5383 Other fatigue: Secondary | ICD-10-CM

## 2020-06-19 DIAGNOSIS — E611 Iron deficiency: Secondary | ICD-10-CM | POA: Diagnosis not present

## 2020-06-19 LAB — CBC WITH DIFFERENTIAL/PLATELET
Basophils Absolute: 0 10*3/uL (ref 0.0–0.1)
Basophils Relative: 0.4 % (ref 0.0–3.0)
Eosinophils Absolute: 0.2 10*3/uL (ref 0.0–0.7)
Eosinophils Relative: 2.7 % (ref 0.0–5.0)
HCT: 38.6 % (ref 36.0–46.0)
Hemoglobin: 12.8 g/dL (ref 12.0–15.0)
Lymphocytes Relative: 40.5 % (ref 12.0–46.0)
Lymphs Abs: 2.7 10*3/uL (ref 0.7–4.0)
MCHC: 33.1 g/dL (ref 30.0–36.0)
MCV: 91.7 fl (ref 78.0–100.0)
Monocytes Absolute: 0.5 10*3/uL (ref 0.1–1.0)
Monocytes Relative: 7.4 % (ref 3.0–12.0)
Neutro Abs: 3.2 10*3/uL (ref 1.4–7.7)
Neutrophils Relative %: 49 % (ref 43.0–77.0)
Platelets: 277 10*3/uL (ref 150.0–400.0)
RBC: 4.21 Mil/uL (ref 3.87–5.11)
RDW: 13.9 % (ref 11.5–15.5)
WBC: 6.6 10*3/uL (ref 4.0–10.5)

## 2020-06-19 LAB — IBC PANEL
Iron: 84 ug/dL (ref 42–145)
Saturation Ratios: 16.9 % — ABNORMAL LOW (ref 20.0–50.0)
Transferrin: 355 mg/dL (ref 212.0–360.0)

## 2020-06-19 LAB — FERRITIN: Ferritin: 8.6 ng/mL — ABNORMAL LOW (ref 10.0–291.0)

## 2020-06-19 LAB — TSH: TSH: 2.16 u[IU]/mL (ref 0.35–4.50)

## 2020-06-19 LAB — VITAMIN D 25 HYDROXY (VIT D DEFICIENCY, FRACTURES): VITD: 74.3 ng/mL (ref 30.00–100.00)

## 2020-06-19 LAB — FOLATE: Folate: >24.8 ng/mL (ref >5.9)

## 2020-06-20 ENCOUNTER — Telehealth: Payer: Self-pay

## 2020-06-20 NOTE — Telephone Encounter (Signed)
Called [employer] for pre-cert.  Told no authorization is needed, ref #:  [id number].  Faxed order to [facility].  Spoke with Short Stay scheduling pt on 06/27/20 at 8:00.  Spoke with pt relaying appt details.  Says she will be out of town.  I provided Short Stay phn # [phone] to call and r/s.

## 2020-06-20 NOTE — Telephone Encounter (Addendum)
Filled and returned to [facility]

## 2020-06-20 NOTE — Telephone Encounter (Signed)
Placed order form in Dr. G's box.  

## 2020-06-21 NOTE — Telephone Encounter (Signed)
Pt called back asking if she could be r/s at a different place possibly today. She feels really bad.

## 2020-06-21 NOTE — Telephone Encounter (Signed)
Spoke with pt stating she had r/s iron infusion on 07/03/20 because she will be out of town on 06/27/20.  Now she is saying she is feeling worse and tried to r/s with [facility] this week before she leaves on trip.  They have nothing available.  Told her I will see if there is somewhere else she can have infusion this week. Pt verbalizes understanding and expresses her thanks.

## 2020-06-22 NOTE — Telephone Encounter (Signed)
Noted  

## 2020-06-22 NOTE — Telephone Encounter (Signed)
Patient called in returning call from [facility]. Informed patient of message. Expressed understanding and thanks.

## 2020-06-22 NOTE — Telephone Encounter (Signed)
Lvm asking pt to call back.  Need to inform her there are no openings for an iron infusion anywhere this week, unless she goes to ER.

## 2020-06-22 NOTE — Telephone Encounter (Signed)
Spoke with [facility] to see what other options were available for pt to have iron infusion before 9/13/1/21.  Told no one else has any openings this week.  Pt's best bet would be going to ER, if she needs right now.

## 2020-06-27 ENCOUNTER — Encounter (HOSPITAL_COMMUNITY): Payer: Medicare PPO

## 2020-06-30 ENCOUNTER — Other Ambulatory Visit (HOSPITAL_COMMUNITY): Payer: Self-pay

## 2020-06-30 NOTE — Discharge Instructions (Signed)

## 2020-07-03 ENCOUNTER — Other Ambulatory Visit: Payer: Self-pay

## 2020-07-03 ENCOUNTER — Ambulatory Visit (HOSPITAL_COMMUNITY)
Admission: RE | Admit: 2020-07-03 | Discharge: 2020-07-03 | Disposition: A | Discharge status: Home / Self Care | Payer: Medicare PPO | Source: Ambulatory Visit | Attending: Family Medicine | Admitting: Family Medicine

## 2020-07-03 DIAGNOSIS — E611 Iron deficiency: Secondary | ICD-10-CM | POA: Diagnosis not present

## 2020-07-03 MED ORDER — SODIUM CHLORIDE 0.9 % IV SOLN
510.0000 mg | INTRAVENOUS | Status: DC
  Administered 2020-07-03: 510 mg via INTRAVENOUS
  Filled 2020-07-03: qty 17

## 2020-07-10 ENCOUNTER — Ambulatory Visit (HOSPITAL_COMMUNITY)
Admission: RE | Admit: 2020-07-10 | Discharge: 2020-07-10 | Disposition: A | Discharge status: Home / Self Care | Payer: Medicare PPO | Source: Ambulatory Visit | Attending: Family Medicine | Admitting: Family Medicine

## 2020-07-10 ENCOUNTER — Other Ambulatory Visit: Payer: Self-pay

## 2020-07-10 DIAGNOSIS — E611 Iron deficiency: Secondary | ICD-10-CM | POA: Diagnosis not present

## 2020-07-10 MED ORDER — SODIUM CHLORIDE 0.9 % IV SOLN
510.0000 mg | INTRAVENOUS | Status: AC
  Administered 2020-07-10: 510 mg via INTRAVENOUS
  Filled 2020-07-10: qty 17

## 2020-07-13 ENCOUNTER — Ambulatory Visit: Payer: Medicare PPO | Admitting: Cardiology

## 2020-07-13 ENCOUNTER — Other Ambulatory Visit: Payer: Self-pay | Admitting: Cardiology

## 2020-07-13 ENCOUNTER — Other Ambulatory Visit: Payer: Self-pay

## 2020-07-13 ENCOUNTER — Encounter: Payer: Self-pay | Admitting: Cardiology

## 2020-07-13 VITALS — BP 132/80 | HR 67 | Ht 64.5 in | Wt 158.8 lb

## 2020-07-13 DIAGNOSIS — I10 Essential (primary) hypertension: Secondary | ICD-10-CM | POA: Diagnosis not present

## 2020-07-13 DIAGNOSIS — I2510 Atherosclerotic heart disease of native coronary artery without angina pectoris: Secondary | ICD-10-CM | POA: Diagnosis not present

## 2020-07-13 DIAGNOSIS — I700 Atherosclerosis of aorta: Secondary | ICD-10-CM | POA: Diagnosis not present

## 2020-07-13 DIAGNOSIS — R002 Palpitations: Secondary | ICD-10-CM

## 2020-07-13 DIAGNOSIS — R030 Elevated blood-pressure reading, without diagnosis of hypertension: Secondary | ICD-10-CM

## 2020-07-13 DIAGNOSIS — E538 Deficiency of other specified B group vitamins: Secondary | ICD-10-CM

## 2020-07-13 DIAGNOSIS — E782 Mixed hyperlipidemia: Secondary | ICD-10-CM

## 2020-07-13 NOTE — Progress Notes (Signed)
Primary Care Provider: Ria Bush, MD Cardiologist: No primary care provider on file. Electrophysiologist: None  Clinic Note: Chief Complaint  Patient presents with  . Follow-up    Extremely fatigued, anemic (iron deficiency)    HPI:    Shelby Vazquez is a 69 y.o. female with a family history of CAD (Coronary CTA showing nonobstructive CAD), and PMH notable for TIA and along with PALPITATIONS and EXERTIONAL DYSPNEA who presents today for annual follow-up.  Shelby Vazquez was last seen on 07/12/2019 --Feeling overall relatively well.  Somewhat stressed out about having to help her daughters out with her children during the COVID-19 homeschooling phase.  Felt like she was being pulled in many directions.  BP was running high. -> Noted one episode of hypotension and tachycardia/fluttering.  Otherwise pain was controlled.  Recent Hospitalizations:   She has had Feraheme infusions on September 13 and 20.  Reviewed  CV studies:    The following studies were reviewed today: (if available, images/films reviewed: From Epic Chart or Care Everywhere) . CT abdomen pelvis (01/19/2020): Mild colonic wall thickening involving 10 cm segment of sigmoid colon with minimal adjacent edema suggesting colitis.  Regional colonic diverticula with no inflammation. . CTA Chest (10/11/2019): Ascending thoracic aorta measuring 3.9 cm.  Scattered calcification of the aortic arch and descending thoracic aorta.  Stable 11 mm right thyroid nodule.   Interval History:   Shelby Vazquez returns here today for routine follow-up stating that she is doing fairly well overall from a cardiac standpoint, she is extremely fatigued.  She had a recent bout of IBS/colitis back in March and then more recently.  She really has not been eating and drinking very well since then.  She is been drinking protein shakes but not eating regular food, as such she is really started developing worsening iron deficiency anemia. -- s/p  Feraheme x 2 in last month.  Very fatigued & SOB.  She just feels tired and worn out.  No energy.  She denies any chest pain or pressure, PND or orthopnea. She is still homeschooling her grandchildren, because at least one of her daughters is adamant about not having her children go back to school secondary to Covid.  She is very worn out because of the time constraints taking care of the children and schooling them.  It makes it even more difficult with her being anemic.  It is very difficult for her to make it to her doctor's appointments and her husband is having to "step up and do a lot more".  CV Review of Symptoms (Summary): positive for - dyspnea on exertion, shortness of breath and Extreme fatigue with exercise intolerance negative for - chest pain, edema, irregular heartbeat, loss of consciousness, orthopnea, palpitations, paroxysmal nocturnal dyspnea, rapid heart rate or Lightheadedness or dizziness, near syncope.;  TIA shows amaurosis fugax, claudication  The patient does not have symptoms concerning for COVID-19 infection (fever, chills, cough, or new shortness of breath).   REVIEWED OF SYSTEMS   ROS Heartburn  I have reviewed and (if needed) personally updated the patient's problem list, medications, allergies, past medical and surgical history, social and family history.   PAST MEDICAL HISTORY   Past Medical History:  Diagnosis Date  . Abdominal aortic atherosclerosis ([place]) 10/2014   by CT scan  . B12 deficiency   . Chronic anxiety   . Chronic cough    cyclical cough syndrome  . Complication of anesthesia 9 yrs ago   awake and in  severe pain with colonoscopy  . Concussion   . Diet-controlled diabetes mellitus ([place]) 02/22/2017   New dx 02/2017  . GERD (gastroesophageal reflux disease)    severe leading to chronic cough and hoarseness  . Hyperlipidemia   . Hypertension   . IBS (irritable bowel syndrome)    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, PB-8 probiotic, healthy diet and  exercise  . 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 Osteopenia 01/2015   T -0.8 spine, -1.7 hip  . Pyelonephritis due to Escherichia coli 07/16/2017  . Uterine prolapse   . Vocal cord paralysis, bilateral partial 2001   evaluated at [facility] - did not require surgery    PAST SURGICAL HISTORY   Past Surgical History:  Procedure Laterality Date  . BUNIONECTOMY     [place] '2000  . CATARACT EXTRACTION     right 01/2010, left may 2011  . COLONOSCOPY  03/2018   diverticulosis, rpt 5 yrs ([name])  . COLONOSCOPY WITH PROPOFOL N/A 03/31/2014   sessile serrated adenomas, sigmoid diverticulosis, rpt 3 yrs Cleotis Nipper, MD)  . CORONARY CALCIUM SCORE-CORONARY CTA  09/2017   Calcium score 53 . Normal right dominant coronary artery system with mild onset of CAD. Aneurysmal dilation of the ascending aorta with maximum diameter diameter of 4.0-3.8 centimeter. -Recommend annual CT/MRA December 2018  . DILATION AND CURETTAGE OF UTERUS  '77   for molar pregnancy excision  . ESOPHAGOGASTRODUODENOSCOPY N/A 03/31/2014   mult gastric polyps, mild mucosal hemorrhages; ESOPHAGOGASTRODUODENOSCOPY (EGD);  Surgeon: Cleotis Nipper, MD  . ESOPHAGOGASTRODUODENOSCOPY ENDOSCOPY  april 2014  . INTERSTIM IMPLANT PLACEMENT  2018  . toe surgery Right 09/2018  . TRANSTHORACIC ECHOCARDIOGRAM  09/2017   Normal LV size and function. EF 55-60%. GR 1 DD. No regional wall motion abnormalities. No significant valvular abnormalities.    Immunization History  Administered Date(s) Administered  . Fluad Quad(high Dose 65+) 08/19/2019  . Influenza Split 08/21/2012  . Influenza Whole 07/25/2009, 08/29/2014  . Influenza, High Dose Seasonal PF 09/12/2017, 08/20/2018  . Influenza,inj,Quad PF,6+ Mos 07/23/2015, 07/23/2016  . Influenza,inj,Quad PF,6-35 Mos  07/24/2013  . PFIZER SARS-COV-2 Vaccination 11/10/2019, 12/01/2019  . Pneumococcal Conjugate-13 02/12/2016  . Pneumococcal Polysaccharide-23 01/20/2013, 08/20/2018  . Td 06/09/2010  . Tdap 07/23/2015  . Zoster 04/28/2013    MEDICATIONS/ALLERGIES   Current Meds  Medication Sig  . acetaminophen (TYLENOL) 500 MG tablet Take 1 tablet (500 mg total) by mouth 2 (two) times a day.  Marland Kitchen aspirin 81 MG tablet Take 1 tablet (81 mg total) by mouth daily.  . B-D 3CC LUER-LOK SYR 25GX1" 25G X 1" 3 ML MISC USE AS DIRECTED WITH  CYANOCOBALAMIN EVERY 30  DAYS  . Calcium Carbonate-Vitamin D 600-200 MG-UNIT CAPS Take 1 capsule by mouth daily.  . cyanocobalamin (,VITAMIN B-12,) 1000 MCG/ML injection Inject 1 mL (1,000 mcg total) into the muscle every 30 (thirty) days.  . famotidine (PEPCID) 40 MG tablet Take 2 tablets (80 mg total) by mouth at bedtime.  . ferrous sulfate 325 (65 FE) MG tablet Take 1 tablet (325 mg total) by mouth daily with breakfast.  . ibuprofen (ADVIL,MOTRIN) 200 MG tablet Take 600 mg by mouth every 6 (six) hours as needed for moderate pain.  Marland Kitchen loratadine (CLARITIN) 10 MG tablet Take 10 mg by mouth daily.   Marland Kitchen  methenamine (HIPREX) 1 g tablet Take 1 g by mouth 2 (two) times daily with a meal.   . ondansetron (ZOFRAN ODT) 4 MG disintegrating tablet Take 1 tablet (4 mg total) by mouth every 8 (eight) hours as needed for nausea or vomiting.  Marland Kitchen Peppermint Oil (IBGARD PO)   . polyethylene glycol powder (MIRALAX) powder Take 1 Container by mouth daily. As needed   . PRESCRIPTION MEDICATION Apply topically 2 (two) times daily. Estradiol Progesterone cream 0.05-30 mg/ml  . Probiotic Product (PROBIOTIC PO) Take 2 capsules by mouth 2 (two) times daily.   . RABEprazole (ACIPHEX) 20 MG tablet Take 20 mg by mouth daily.  . rosuvastatin (CRESTOR) 20 MG tablet TAKE 1 TABLET(20 MG) BY MOUTH DAILY  . sucralfate (CARAFATE) 1 GM/10ML suspension take 10 milliliters by mouth four times a day if needed  .  venlafaxine XR (EFFEXOR-XR) 75 MG 24 hr capsule Take 1 capsule (75 mg total) by mouth daily with breakfast.  . Vitamin B Complex-C CAPS   . [DISCONTINUED] metoprolol tartrate (LOPRESSOR) 50 MG tablet TAKE 1 TABLET(50 MG) BY MOUTH TWICE DAILY. MAY TAKE AN EXTRA 25 MG TO 50 MG TABLET FOR PALP.AND/ OR BLOOD PRESSURES    Allergies  Allergen Reactions  . Augmentin [Amoxicillin-Pot Clavulanate] Other (See Comments)    Severe diarrhea, rash, worsened acid reflux  . 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  She provides meals for them and occasionally they actually spend the night.  OBJCTIVE -PE, EKG, labs   Wt Readings from Last 3 Encounters:  07/13/20 158 lb 12.8 oz (72 kg)  04/18/20 156 lb 7 oz (71 kg)  01/12/20 164 lb 4.8 oz (74.5 kg)    Physical Exam: BP 132/80   Pulse 67   Ht 5' 4.5" (1.638 m)   Wt 158 lb 12.8 oz (72 kg)   SpO2 95%   BMI 26.84 kg/m  Physical Exam Vitals reviewed.  Constitutional:      General: She is not in acute distress.    Appearance: Normal appearance. She is normal weight. She is not ill-appearing or toxic-appearing.  Neck:     Vascular: No carotid bruit, hepatojugular reflux or JVD.  Cardiovascular:     Rate and Rhythm: Normal rate and regular rhythm.  No extrasystoles are present.    Chest Wall: PMI is not displaced.     Pulses: Intact distal pulses.     Heart sounds: No friction rub. Gallop present. S4 sounds present.   Pulmonary:     Effort: Pulmonary effort is normal. No respiratory distress.     Breath sounds: Normal breath sounds.  Musculoskeletal:        General: No swelling. Normal range of motion.     Cervical back: Normal range of motion.  Neurological:     General: No focal deficit present.     Mental Status: She is alert and oriented to person, place, and time.    Psychiatric:        Mood and Affect: Mood normal.        Behavior: Behavior normal.        Thought Content: Thought content normal.        Judgment: Judgment normal.      Adult ECG Report  Rate: 67 ;  Rhythm: normal sinus rhythm and Normal axis, intervals and durations.  Essentially normal EKG.;  Narrative Interpretation: Stable EKG  Recent Labs:    Lab Results  Component Value Date   CHOL 120 04/13/2020   HDL 38.40 (L) 04/13/2020   LDLCALC 58 04/13/2020   LDLDIRECT 109.0 12/25/2017   TRIG 117.0 04/13/2020   CHOLHDL 3 04/13/2020   Lab Results  Component Value Date   CREATININE 0.85 04/13/2020   BUN 16 04/13/2020   NA 137 04/13/2020   K 3.7 04/13/2020   CL 109 04/13/2020   CO2 26 04/13/2020   Lab Results  Component Value Date   TSH 2.16 06/19/2020    ASSESSMENT/PLAN    Problem List Items Addressed This Visit    Essential hypertension (Chronic)    Borderline pressure today.  With her level of fatigue, would not be much more aggressive than her current dose of metoprolol.  Continue to monitor, but pretty stable.      Abdominal aortic atherosclerosis (HCC) (Chronic)    She has both thoracic and abdominal aortic atherosclerosis.  Continue statin she is on aspirin for nonobstructive CAD.  If she has not had a CT done for other reason, will check CTA chest abdomen pelvis in 2 years.      Coronary artery disease, non-occlusive - Cor CA Score 53.  MIldCAD only. - Primary (Chronic)    Relatively low coronary calcium score with no significant disease noted on coronary CTA.    Continue risk factor modification with combination of coronary and thoracic/abdominal aortic atherosclerosis. ->  BP control, statin with goal LDL less than 100), questionable benefit from standing dose aspirin.      Relevant Orders   EKG 12-Lead (Completed)   Hyperlipidemia (Chronic)    Goal LDL is less than 100.  Would ideally be less than 70.  Labs being followed by PCP.  She is on 20 mg  rosuvastatin. ->  Most recent LDL calc was 58 -> seems to be at goal.  Continue to monitor.      Rapid palpitations (Chronic)    Well-controlled on metoprolol.  This is the reason why we have chosen beta-blocker over other agents for control.  She has not required additional doses for either hypertension or palpitations.      Vitamin B12 deficiency    She has both iron and vitamin B12 deficiency.  Being managed by PCP.  Vitamin B12 repletion.      Situational hypertension    Seems to be stable with beta-blocker.  With her fatigue, I am reluctant to increase dose.  She has not had to use an additional 25 mg tablet.          COVID-19 Education: The signs and symptoms of COVID-19 were discussed with the patient and how to seek care for testing (follow up with PCP or arrange E-visit).   The importance of social distancing and COVID-19 vaccination was discussed today.  The patient is practicing social distancing & Masking.   I spent a total of 33minutes with the patient spent in direct patient consultation.  Additional time spent with chart review  / charting (studies, outside notes, etc): 8 Total Time: 30 min   Current medicines are reviewed at length with the patient today.  (+/- concerns) n/a  Notice: This dictation was prepared with Dragon dictation along with smaller phrase technology. Any transcriptional errors that result from this process are unintentional and may not be corrected upon review.  Patient Instructions / Medication Changes & Studies & Tests Ordered   Patient Instructions   Follow-Up: At [facility],  you and your health needs are our priority.  As part of our continuing mission to provide you with exceptional heart care, we have created designated Provider Care Teams.  These Care Teams include your primary Cardiologist (physician) and Advanced Practice Providers (APPs -  Physician Assistants and Nurse Practitioners) who all work together to provide you with  the care you need, when you need it.  We recommend signing up for the patient portal called "MyChart".  Sign up information is provided on this After Visit Summary.  MyChart is used to connect with patients for Virtual Visits (Telemedicine).  Patients are able to view lab/test results, encounter notes, upcoming appointments, etc.  Non-urgent messages can be sent to your provider as well.   To learn more about what you can do with MyChart, go to [email or web address].    Your next appointment:   12 month(s)  The format for your next appointment:   Virtual Visit   Provider:   You may see DR. Mayela Bullard or one of the following Advanced Practice Providers on your designated Care Team:    Rosaria Ferries, PA-C  Jory Sims, DNP, ANP     Studies Ordered:   Orders Placed This Encounter  Procedures  . EKG 12-Lead     Glenetta Hew, M.D., M.S. Interventional Cardiologist   Pager # [phone] Phone # [phone] [address] [place], [place] [address]   Thank you for choosing Heartcare at [facility]!!

## 2020-07-13 NOTE — Patient Instructions (Signed)
  Follow-Up: At [facility], you and your health needs are our priority.  As part of our continuing mission to provide you with exceptional heart care, we have created designated Provider Care Teams.  These Care Teams include your primary Cardiologist (physician) and Advanced Practice Providers (APPs -  Physician Assistants and Nurse Practitioners) who all work together to provide you with the care you need, when you need it.  We recommend signing up for the patient portal called "MyChart".  Sign up information is provided on this After Visit Summary.  MyChart is used to connect with patients for Virtual Visits (Telemedicine).  Patients are able to view lab/test results, encounter notes, upcoming appointments, etc.  Non-urgent messages can be sent to your provider as well.   To learn more about what you can do with MyChart, go to [email or web address].    Your next appointment:   12 month(s)  The format for your next appointment:   Virtual Visit   Provider:   You may see DR. DAVID HARDING or one of the following Advanced Practice Providers on your designated Care Team:    Rosaria Ferries, PA-C  Jory Sims, DNP, ANP

## 2020-07-19 ENCOUNTER — Encounter: Payer: Self-pay | Admitting: Cardiology

## 2020-07-19 NOTE — Assessment & Plan Note (Signed)
She has both iron and vitamin B12 deficiency.  Being managed by PCP.  Vitamin B12 repletion.

## 2020-07-19 NOTE — Assessment & Plan Note (Signed)
Relatively low coronary calcium score with no significant disease noted on coronary CTA.    Continue risk factor modification with combination of coronary and thoracic/abdominal aortic atherosclerosis. ->  BP control, statin with goal LDL less than 100), questionable benefit from standing dose aspirin.

## 2020-07-19 NOTE — Assessment & Plan Note (Addendum)
She has both thoracic and abdominal aortic atherosclerosis.  Continue statin she is on aspirin for nonobstructive CAD.  If she has not had a CT done for other reason, will check CTA chest abdomen pelvis in 2 years.

## 2020-07-19 NOTE — Assessment & Plan Note (Signed)
Goal LDL is less than 100.  Would ideally be less than 70.  Labs being followed by PCP.  She is on 20 mg rosuvastatin. ->  Most recent LDL calc was 58 -> seems to be at goal.  Continue to monitor.

## 2020-07-19 NOTE — Assessment & Plan Note (Signed)
Seems to be stable with beta-blocker.  With her fatigue, I am reluctant to increase dose.  She has not had to use an additional 25 mg tablet.

## 2020-07-19 NOTE — Assessment & Plan Note (Signed)
Borderline pressure today.  With her level of fatigue, would not be much more aggressive than her current dose of metoprolol.  Continue to monitor, but pretty stable.

## 2020-07-19 NOTE — Assessment & Plan Note (Signed)
Well-controlled on metoprolol.  This is the reason why we have chosen beta-blocker over other agents for control.  She has not required additional doses for either hypertension or palpitations.

## 2020-07-27 ENCOUNTER — Telehealth: Payer: Self-pay

## 2020-07-27 DIAGNOSIS — E611 Iron deficiency: Secondary | ICD-10-CM

## 2020-07-27 NOTE — Addendum Note (Signed)
Addended by: Ria Bush on: 07/27/2020 12:19 PM   Modules accepted: Orders

## 2020-07-27 NOTE — Telephone Encounter (Signed)
Pt stated she needs to know if she needs to be tested again for low iron. She thinks she might need another infusion. She needs something to get her iron up. She has changed her diet and taking the iron pills but still feel the same. She would like a call.

## 2020-07-27 NOTE — Telephone Encounter (Signed)
Spoke with pt relaying Dr. Synthia Innocent message.  Pt verbalizes understanding.  Had pt scheduled at 12:30 tomorrow and she is aware. FYI to Dr. Darnell Level.

## 2020-07-27 NOTE — Telephone Encounter (Addendum)
S/p feraheme infusions 9/13 and 9/20.  Recommend retesting levels next week on or after wed 10/13. Labs ordered.  If low we will discuss rpt infusion.  If normal, rec OV for further evaluation of fatigue.   Alternatively would offer OV prior to labs to discuss fatigue and see if we need to add any other labwork to next blood draw.

## 2020-07-28 ENCOUNTER — Other Ambulatory Visit: Payer: Self-pay

## 2020-07-28 ENCOUNTER — Encounter: Payer: Self-pay | Admitting: Family Medicine

## 2020-07-28 ENCOUNTER — Ambulatory Visit: Payer: Medicare PPO | Admitting: Family Medicine

## 2020-07-28 VITALS — BP 120/82 | HR 73 | Temp 97.7°F | Ht 64.5 in | Wt 156.5 lb

## 2020-07-28 DIAGNOSIS — Z23 Encounter for immunization: Secondary | ICD-10-CM

## 2020-07-28 DIAGNOSIS — E611 Iron deficiency: Secondary | ICD-10-CM | POA: Diagnosis not present

## 2020-07-28 DIAGNOSIS — R5383 Other fatigue: Secondary | ICD-10-CM | POA: Diagnosis not present

## 2020-07-28 NOTE — Assessment & Plan Note (Addendum)
Continue current regimen of oral iron and iron rich diet. Recheck iron levels next week.

## 2020-07-28 NOTE — Patient Instructions (Addendum)
Flu shot today  Continue iron regimen including diet changes.  Return next week for labs only and we will check iron levels again as well as inflammatory marker and kidneys, sugar, liver.

## 2020-07-28 NOTE — Progress Notes (Signed)
This visit was conducted in person.  BP 120/82 (BP Location: Left Arm, Patient Position: Sitting, Cuff Size: Normal)   Pulse 73   Temp 97.7 F (36.5 C) (Temporal)   Ht 5' 4.5" (1.638 m)   Wt 156 lb 8 oz (71 kg)   SpO2 95%   BMI 26.45 kg/m    CC: discuss fatigue Subjective:    Patient ID: Shelby Vazquez, female    DOB: Mar 03, 1951, 69 y.o.   MRN: [id number]  HPI: Shelby Vazquez is a 69 y.o. female presenting on 07/28/2020 for Fatigue (C/o onging fatigue. )   Ongoing fatigue despite Feraheme IV x2 06/2020. Not much benefit. She has changed diet - making a conscious effort to eat more steak, kale soup, cream of wheat - and has been taking oral iron 346m daily regularly with OJ on empty stomach, has stopped calcium die to Fe interaction. B12 vitamin has been replaced.  Lab Results  Component Value Date   [id number] 169(H) 04/13/2020    Ongoing marked fatigue associated with swollen glands. Actually this week noticed mild improvement in fatigue. She has been caring for grandson this week (he was out due to nasal congestion). Both grandson and husband tested negative for covid recently. HA did improve after iron infusion. Known IBS manages with miralax and fiber gummies. No recent tick bites.   No more dyspnea or palpitations, chest pain, dizziness. No cough, loss of taste/smell, abd pain, diarrhea or nausea.  No night sweats, fevers/chills. Weight stable.   She got COVID booster 07/19/2020. Fatigue did worsen after this.      Relevant past medical, surgical, family and social history reviewed and updated as indicated. Interim medical history since our last visit reviewed. Allergies and medications reviewed and updated. Outpatient Medications Prior to Visit  Medication Sig Dispense Refill  . acetaminophen (TYLENOL) 500 MG tablet Take 1 tablet (500 mg total) by mouth 2 (two) times a day.    .Marland Kitchenaspirin 81 MG tablet Take 1 tablet (81 mg total) by mouth daily. 30 tablet   . B-D 3CC  LUER-LOK SYR 25GX1" 25G X 1" 3 ML MISC USE AS DIRECTED WITH  CYANOCOBALAMIN EVERY 30  DAYS 12 each 0  . Calcium Carbonate-Vitamin D 600-200 MG-UNIT CAPS Take 1 capsule by mouth daily.    . clidinium-chlordiazePOXIDE (LIBRAX) 5-2.5 MG capsule TAKE 1 CAPSULE BY MOUTH TWICE DAILY 60 capsule 4  . cyanocobalamin (,VITAMIN B-12,) 1000 MCG/ML injection Inject 1 mL (1,000 mcg total) into the muscle every 30 (thirty) days. 3 mL 3  . famotidine (PEPCID) 40 MG tablet Take 2 tablets (80 mg total) by mouth at bedtime. 180 tablet 3  . ferrous sulfate 325 (65 FE) MG tablet Take 1 tablet (325 mg total) by mouth daily with breakfast.    . ibuprofen (ADVIL,MOTRIN) 200 MG tablet Take 600 mg by mouth every 6 (six) hours as needed for moderate pain.    .Marland Kitchenloratadine (CLARITIN) 10 MG tablet Take 10 mg by mouth daily.     . methenamine (HIPREX) 1 g tablet Take 1 g by mouth 2 (two) times daily with a meal.   1  . metoprolol tartrate (LOPRESSOR) 50 MG tablet TAKE 1 TABLET BY MOUTH TWICE DAILY. MAY TAKE AN EXTRA 1/2 TO 1 TABLET FOR PALPITATIONS AND/OR BLOOD PRESSURE 270 tablet 3  . ondansetron (ZOFRAN ODT) 4 MG disintegrating tablet Take 1 tablet (4 mg total) by mouth every 8 (eight) hours as needed for nausea or vomiting. 2[place]  tablet 0  . Peppermint Oil (IBGARD PO)     . polyethylene glycol powder (MIRALAX) powder Take 1 Container by mouth daily. As needed     . PRESCRIPTION MEDICATION Apply topically 2 (two) times daily. Estradiol Progesterone cream 0.05-30 mg/ml    . Probiotic Product (PROBIOTIC PO) Take 2 capsules by mouth 2 (two) times daily.     . RABEprazole (ACIPHEX) 20 MG tablet Take 20 mg by mouth daily.    . rosuvastatin (CRESTOR) 20 MG tablet TAKE 1 TABLET(20 MG) BY MOUTH DAILY 90 tablet 3  . sucralfate (CARAFATE) 1 GM/10ML suspension take 10 milliliters by mouth four times a day if needed 420 mL 0  . venlafaxine XR (EFFEXOR-XR) 75 MG 24 hr capsule Take 1 capsule (75 mg total) by mouth daily with breakfast. 90  capsule 3  . Vitamin B Complex-C CAPS      No facility-administered medications prior to visit.     Per HPI unless specifically indicated in ROS section below Review of Systems Objective:  BP 120/82 (BP Location: Left Arm, Patient Position: Sitting, Cuff Size: Normal)   Pulse 73   Temp 97.7 F (36.5 C) (Temporal)   Ht 5' 4.5" (1.638 m)   Wt 156 lb 8 oz (71 kg)   SpO2 95%   BMI 26.45 kg/m   Wt Readings from Last 3 Encounters:  07/28/20 156 lb 8 oz (71 kg)  07/13/20 158 lb 12.8 oz (72 kg)  04/18/20 156 lb 7 oz (71 kg)      Physical Exam Vitals and nursing note reviewed.  Constitutional:      Appearance: Normal appearance. She is not ill-appearing.  Neck:     Thyroid: No thyroid mass or thyromegaly.  Cardiovascular:     Rate and Rhythm: Normal rate and regular rhythm.     Pulses: Normal pulses.     Heart sounds: Normal heart sounds. No murmur heard.   Pulmonary:     Effort: Pulmonary effort is normal. No respiratory distress.     Breath sounds: Normal breath sounds. No wheezing, rhonchi or rales.  Musculoskeletal:     Right lower leg: No edema.     Left lower leg: No edema.  Lymphadenopathy:     Head:     Right side of head: Tonsillar (mild) adenopathy present. No submental, submandibular, preauricular or posterior auricular adenopathy.     Left side of head: Tonsillar (mild) adenopathy present. No submental, submandibular, preauricular or posterior auricular adenopathy.     Upper Body:     Right upper body: No supraclavicular or axillary adenopathy.     Left upper body: No supraclavicular or axillary adenopathy.  Skin:    General: Skin is warm and dry.     Coloration: Skin is not pale.     Findings: No rash.  Neurological:     Mental Status: She is alert.  Psychiatric:        Mood and Affect: Mood normal.        Behavior: Behavior normal.       Results for orders placed or performed in visit on 06/19/20  TSH  Result Value Ref Range   TSH 2.16 0.35 - 4.50  uIU/mL  CBC with Differential/Platelet  Result Value Ref Range   WBC 6.6 4.0 - 10.5 K/uL   RBC 4.21 3.87 - 5.11 Mil/uL   Hemoglobin 12.8 12.0 - 15.0 g/dL   HCT 38.6 36 - 46 %   MCV 91.7 78.0 - 100.0 fl  MCHC 33.1 30.0 - 36.0 g/dL   RDW 13.9 11.5 - 15.5 %   Platelets 277.0 150 - 400 K/uL   Neutrophils Relative % 49.0 43 - 77 %   Lymphocytes Relative 40.5 12 - 46 %   Monocytes Relative 7.4 3 - 12 %   Eosinophils Relative 2.7 0 - 5 %   Basophils Relative 0.4 0 - 3 %   Neutro Abs 3.2 1.4 - 7.7 K/uL   Lymphs Abs 2.7 0.7 - 4.0 K/uL   Monocytes Absolute 0.5 0.1 - 1.0 K/uL   Eosinophils Absolute 0.2 0 - 0 K/uL   Basophils Absolute 0.0 0 - 0 K/uL  VITAMIN D 25 Hydroxy (Vit-D Deficiency, Fractures)  Result Value Ref Range   VITD 74.30 30.00 - 100.00 ng/mL  Folate  Result Value Ref Range   Folate >24.8 >5.9 ng/mL  IBC panel  Result Value Ref Range   Iron 84 42 - 145 ug/dL   Transferrin 355.0 212.0 - 360.0 mg/dL   Saturation Ratios 16.9 (L) 20.0 - 50.0 %  Ferritin  Result Value Ref Range   Ferritin 8.6 (L) 10.0 - 291.0 ng/mL   Assessment & Plan:  This visit occurred during the SARS-CoV-2 public health emergency.  Safety protocols were in place, including screening questions prior to the visit, additional usage of staff PPE, and extensive cleaning of exam room while observing appropriate contact time as indicated for disinfecting solutions.   Problem List Items Addressed This Visit    Iron deficiency    Continue current regimen of oral iron and iron rich diet. Recheck iron levels next week.       Fatigue - Primary    Ongoing marked fatigue, didn't notice significant benefit initially but this week starting to note mild benefit in fatigue levels. Unclear cause otherwise, recent labwork unrevealing including normal TSH. Will add ESR and CMP to upcoming iron studies planned for next week. Pt agrees with plan.       Relevant Orders   Sedimentation rate   Comprehensive metabolic  panel    Other Visit Diagnoses    Need for influenza vaccination       Relevant Orders   Flu Vaccine QUAD High Dose(Fluad) (Completed)       No orders of the defined types were placed in this encounter.  Orders Placed This Encounter  Procedures  . Flu Vaccine QUAD High Dose(Fluad)  . Sedimentation rate    Standing Status:   Future    Standing Expiration Date:   07/28/2021  . Comprehensive metabolic panel    Standing Status:   Future    Standing Expiration Date:   07/28/2021    Patient Instructions  Flu shot today  Continue iron regimen including diet changes.  Return next week for labs only and we will check iron levels again as well as inflammatory marker and kidneys, sugar, liver.   Follow up plan: Return if symptoms worsen or fail to improve.  Ria Bush, MD

## 2020-07-28 NOTE — Assessment & Plan Note (Signed)
Ongoing marked fatigue, didn't notice significant benefit initially but this week starting to note mild benefit in fatigue levels. Unclear cause otherwise, recent labwork unrevealing including normal TSH. Will add ESR and CMP to upcoming iron studies planned for next week. Pt agrees with plan.

## 2020-07-31 ENCOUNTER — Other Ambulatory Visit: Payer: Self-pay

## 2020-07-31 ENCOUNTER — Encounter: Payer: Self-pay | Admitting: Family Medicine

## 2020-07-31 ENCOUNTER — Other Ambulatory Visit (INDEPENDENT_AMBULATORY_CARE_PROVIDER_SITE_OTHER): Payer: Medicare PPO

## 2020-07-31 DIAGNOSIS — R5383 Other fatigue: Secondary | ICD-10-CM | POA: Diagnosis not present

## 2020-07-31 DIAGNOSIS — E611 Iron deficiency: Secondary | ICD-10-CM

## 2020-08-01 LAB — FERRITIN: Ferritin: 269.6 ng/mL (ref 10.0–291.0)

## 2020-08-01 LAB — COMPREHENSIVE METABOLIC PANEL
ALT: 26 U/L (ref 0–35)
AST: 29 U/L (ref 0–37)
Albumin: 4.2 g/dL (ref 3.5–5.2)
Alkaline Phosphatase: 45 U/L (ref 39–117)
BUN: 23 mg/dL (ref 6–23)
CO2: 28 mEq/L (ref 19–32)
Calcium: 9.4 mg/dL (ref 8.4–10.5)
Chloride: 100 mEq/L (ref 96–112)
Creatinine, Ser: 0.86 mg/dL (ref 0.40–1.20)
GFR: 68.65 mL/min (ref >60.00)
Glucose, Bld: 91 mg/dL (ref 70–99)
Potassium: 4.2 mEq/L (ref 3.5–5.1)
Sodium: 135 mEq/L (ref 135–145)
Total Bilirubin: 0.2 mg/dL (ref 0.2–1.2)
Total Protein: 6.6 g/dL (ref 6.0–8.3)

## 2020-08-01 LAB — SEDIMENTATION RATE: Sed Rate: 3 mm/hr (ref 0–30)

## 2020-08-01 LAB — IBC PANEL
Iron: 109 ug/dL (ref 42–145)
Saturation Ratios: 32.9 % (ref 20.0–50.0)
Transferrin: 237 mg/dL (ref 212.0–360.0)

## 2020-08-10 DIAGNOSIS — R339 Retention of urine, unspecified: Secondary | ICD-10-CM | POA: Diagnosis not present

## 2020-08-28 ENCOUNTER — Other Ambulatory Visit: Payer: Self-pay | Admitting: Family Medicine

## 2020-09-25 DIAGNOSIS — Z6827 Body mass index (BMI) 27.0-27.9, adult: Secondary | ICD-10-CM | POA: Diagnosis not present

## 2020-09-25 DIAGNOSIS — Z1231 Encounter for screening mammogram for malignant neoplasm of breast: Secondary | ICD-10-CM | POA: Diagnosis not present

## 2020-09-25 DIAGNOSIS — Z124 Encounter for screening for malignant neoplasm of cervix: Secondary | ICD-10-CM | POA: Diagnosis not present

## 2020-09-30 DIAGNOSIS — U071 COVID-19: Secondary | ICD-10-CM | POA: Diagnosis not present

## 2020-09-30 DIAGNOSIS — R059 Cough, unspecified: Secondary | ICD-10-CM | POA: Diagnosis not present

## 2020-10-02 ENCOUNTER — Other Ambulatory Visit (HOSPITAL_COMMUNITY): Payer: Self-pay

## 2020-10-03 ENCOUNTER — Ambulatory Visit (HOSPITAL_COMMUNITY)
Admission: RE | Admit: 2020-10-03 | Discharge: 2020-10-03 | Disposition: A | Discharge status: Home / Self Care | Payer: Medicare Other | Source: Ambulatory Visit | Attending: Pulmonary Disease | Admitting: Pulmonary Disease

## 2020-10-03 ENCOUNTER — Telehealth: Payer: Self-pay | Admitting: Unknown Physician Specialty

## 2020-10-03 ENCOUNTER — Other Ambulatory Visit: Payer: Self-pay | Admitting: Unknown Physician Specialty

## 2020-10-03 DIAGNOSIS — I10 Essential (primary) hypertension: Secondary | ICD-10-CM | POA: Diagnosis present

## 2020-10-03 DIAGNOSIS — E119 Type 2 diabetes mellitus without complications: Secondary | ICD-10-CM | POA: Diagnosis present

## 2020-10-03 DIAGNOSIS — U071 COVID-19: Secondary | ICD-10-CM

## 2020-10-03 DIAGNOSIS — Z23 Encounter for immunization: Secondary | ICD-10-CM | POA: Diagnosis not present

## 2020-10-03 MED ORDER — SODIUM CHLORIDE 0.9 % IV SOLN: Freq: Once | INTRAVENOUS | Status: AC

## 2020-10-03 MED ORDER — SODIUM CHLORIDE 0.9 % IV SOLN: INTRAVENOUS | Status: DC | PRN

## 2020-10-03 MED ORDER — ALBUTEROL SULFATE HFA 108 (90 BASE) MCG/ACT IN AERS: 2.0000 | INHALATION_SPRAY | Freq: Once | RESPIRATORY_TRACT | Status: DC | PRN

## 2020-10-03 MED ORDER — FAMOTIDINE IN NACL 20-0.9 MG/50ML-% IV SOLN: 20.0000 mg | Freq: Once | INTRAVENOUS | Status: DC | PRN

## 2020-10-03 MED ORDER — DIPHENHYDRAMINE HCL 50 MG/ML IJ SOLN: 50.0000 mg | Freq: Once | INTRAMUSCULAR | Status: DC | PRN

## 2020-10-03 MED ORDER — EPINEPHRINE 0.3 MG/0.3ML IJ SOAJ: 0.3000 mg | Freq: Once | INTRAMUSCULAR | Status: DC | PRN

## 2020-10-03 MED ORDER — METHYLPREDNISOLONE SODIUM SUCC 125 MG IJ SOLR: 125.0000 mg | Freq: Once | INTRAMUSCULAR | Status: DC | PRN

## 2020-10-03 NOTE — Discharge Instructions (Signed)
10 Things You Can Do to Manage Your COVID-19 Symptoms at Home If you have possible or confirmed COVID-19: 1. Stay home from work and school. And stay away from other public places. If you must go out, avoid using any kind of public transportation, ridesharing, or taxis. 2. Monitor your symptoms carefully. If your symptoms get worse, call your healthcare provider immediately. 3. Get rest and stay hydrated. 4. If you have a medical appointment, call the healthcare provider ahead of time and tell them that you have or may have COVID-19. 5. For medical emergencies, call 911 and notify the dispatch personnel that you have or may have COVID-19. 6. Cover your cough and sneezes with a tissue or use the inside of your elbow. 7. Wash your hands often with soap and water for at least 20 seconds or clean your hands with an alcohol-based hand sanitizer that contains at least 60% alcohol. 8. As much as possible, stay in a specific room and away from other people in your home. Also, you should use a separate bathroom, if available. If you need to be around other people in or outside of the home, wear a mask. 9. Avoid sharing personal items with other people in your household, like dishes, towels, and bedding. 10. Clean all surfaces that are touched often, like counters, tabletops, and doorknobs. Use household cleaning sprays or wipes according to the label instructions. cdc.gov/coronavirus 04/21/2019 This information is not intended to replace advice given to you by your health care provider. Make sure you discuss any questions you have with your health care provider. Document Revised: 09/23/2019 Document Reviewed: 09/23/2019 Elsevier Patient Education  2020 Elsevier Inc. What types of side effects do monoclonal antibody drugs cause?  Common side effects  In general, the more common side effects caused by monoclonal antibody drugs include: . Allergic reactions, such as hives or itching . Flu-like signs and  symptoms, including chills, fatigue, fever, and muscle aches and pains . Nausea, vomiting . Diarrhea . Skin rashes . Low blood pressure   The CDC is recommending patients who receive monoclonal antibody treatments wait at least 90 days before being vaccinated.  Currently, there are no data on the safety and efficacy of mRNA COVID-19 vaccines in persons who received monoclonal antibodies or convalescent plasma as part of COVID-19 treatment. Based on the estimated half-life of such therapies as well as evidence suggesting that reinfection is uncommon in the 90 days after initial infection, vaccination should be deferred for at least 90 days, as a precautionary measure until additional information becomes available, to avoid interference of the antibody treatment with vaccine-induced immune responses. If you have any questions or concerns after the infusion please call the Advanced Practice Provider on call at [phone]. This number is ONLY intended for your use regarding questions or concerns about the infusion post-treatment side-effects.  Please do not provide this number to others for use. For return to work notes please contact your primary care provider.   If someone you know is interested in receiving treatment please have them call the COVID hotline at [phone].   

## 2020-10-03 NOTE — Progress Notes (Signed)
Patient reviewed Fact Sheet for Patients, Parents, and Caregivers for Emergency Use Authorization (EUA) of Bam/Ete for the Treatment of Coronavirus. Patient also reviewed and is agreeable to the estimated cost of treatment. Patient is agreeable to proceed.

## 2020-10-03 NOTE — Telephone Encounter (Signed)
I connected by phone with [name] on 10/03/2020 at 12:20 PM to discuss the potential use of a new treatment for mild to moderate COVID-19 viral infection in non-hospitalized patients.  This patient is a 69 y.o. female that meets the FDA criteria for Emergency Use Authorization of COVID monoclonal antibody casirivimab/imdevimab, bamlanivimab/eteseviamb, or sotrovimab.  Has a (+) direct SARS-CoV-2 viral test result  Has mild or moderate COVID-19   Is NOT hospitalized due to COVID-19  Is within 10 days of symptom onset  Has at least one of the high risk factor(s) for progression to severe COVID-19 and/or hospitalization as defined in EUA.  Specific high risk criteria : Older age (>/= 69 yo), BMI > 25 and Cardiovascular disease or hypertension   I have spoken and communicated the following to the patient or parent/caregiver regarding COVID monoclonal antibody treatment:  1. FDA has authorized the emergency use for the treatment of mild to moderate COVID-19 in adults and pediatric patients with positive results of direct SARS-CoV-2 viral testing who are 47 years of age and older weighing at least 40 kg, and who are at high risk for progressing to severe COVID-19 and/or hospitalization.  2. The significant known and potential risks and benefits of COVID monoclonal antibody, and the extent to which such potential risks and benefits are unknown.  3. Information on available alternative treatments and the risks and benefits of those alternatives, including clinical trials.  4. Patients treated with COVID monoclonal antibody should continue to self-isolate and use infection control measures (e.g., wear mask, isolate, social distance, avoid sharing personal items, clean and disinfect "high touch" surfaces, and frequent handwashing) according to CDC guidelines.   5. The patient or parent/caregiver has the option to accept or refuse COVID monoclonal antibody treatment.  After reviewing this  information with the patient, the patient has agreed to receive one of the available covid 19 monoclonal antibodies and will be provided an appropriate fact sheet prior to infusion. Kathrine Haddock, NP 10/03/2020 12:20 PM  Sx onset 12/09

## 2020-10-03 NOTE — Progress Notes (Signed)
  Diagnosis: COVID-19  Physician: Dr. Patrick Wright  Procedure: [facility] Infusion [facility] Med: bamlanivimab\etesevimab infusion - Provided patient with bamlanimivab\etesevimab fact sheet for patients, parents and caregivers prior to infusion.  Complications: No immediate complications noted.  Discharge: Discharged home   Shelby Vazquez 10/03/2020   

## 2020-10-15 ENCOUNTER — Other Ambulatory Visit: Payer: Self-pay | Admitting: Family Medicine

## 2020-10-16 ENCOUNTER — Other Ambulatory Visit: Payer: Medicare Other

## 2020-10-23 ENCOUNTER — Other Ambulatory Visit: Payer: Self-pay | Admitting: Family Medicine

## 2020-10-23 DIAGNOSIS — H524 Presbyopia: Secondary | ICD-10-CM | POA: Diagnosis not present

## 2020-10-23 DIAGNOSIS — Z961 Presence of intraocular lens: Secondary | ICD-10-CM | POA: Diagnosis not present

## 2020-10-23 DIAGNOSIS — R7303 Prediabetes: Secondary | ICD-10-CM | POA: Diagnosis not present

## 2020-10-23 LAB — HM DIABETES EYE EXAM

## 2020-10-24 NOTE — Telephone Encounter (Signed)
ERx 

## 2020-10-26 LAB — HM MAMMOGRAPHY

## 2020-10-30 ENCOUNTER — Encounter: Payer: Self-pay | Admitting: Family Medicine

## 2020-10-31 ENCOUNTER — Telehealth: Payer: Self-pay | Admitting: Family Medicine

## 2020-10-31 NOTE — Telephone Encounter (Signed)
Do you want to try an alternative medication or sign pt up for pt assistance?

## 2020-10-31 NOTE — Telephone Encounter (Signed)
Pt states she received the same letter from insurance regarding Librax that she received last year. She is requesting patient assistance form to be filled out. She also states this can be changed to another medication that is just as good if it needs to be. She is wondering if this can be made for an entire year so she doesn't have to go through the process every few months.

## 2020-11-01 ENCOUNTER — Encounter: Payer: Self-pay | Admitting: Family Medicine

## 2020-11-08 DIAGNOSIS — R339 Retention of urine, unspecified: Secondary | ICD-10-CM | POA: Diagnosis not present

## 2020-11-09 NOTE — Telephone Encounter (Signed)
Please submit PA for librax.

## 2020-11-10 NOTE — Telephone Encounter (Signed)
Submitted PA for clidinium-chlordiazepoxide (Librax) 5-2.5 mg cap; key:  [id number], PA case ID:  [id number].  Decision pending.

## 2020-11-15 NOTE — Telephone Encounter (Signed)
Received faxed PA approval; valid until 12/312/2022.

## 2020-11-27 ENCOUNTER — Other Ambulatory Visit: Payer: Self-pay | Admitting: Family Medicine

## 2020-11-27 NOTE — Telephone Encounter (Signed)
Name of Medication: Librax Name of Pharmacy: [facility] or Written Date and Quantity: 10/24/20, #60 Last Office Visit and Type: 07/28/20, fatigue Next Office Visit and Type: 04/20/21, AWV prt 2 Last Controlled Substance Agreement Date: none Last UDS: none

## 2020-11-28 NOTE — Telephone Encounter (Signed)
Patient is calling back with status. Please advise. EM

## 2020-11-29 NOTE — Telephone Encounter (Signed)
ERx 

## 2020-12-28 ENCOUNTER — Other Ambulatory Visit: Payer: Self-pay | Admitting: Family Medicine

## 2020-12-28 NOTE — Telephone Encounter (Signed)
Name of Medication: Librax Name of Pharmacy: [facility] or Written Date and Quantity: 11/29/20, #60 Last Office Visit and Type: 07/28/20, fatigue Next Office Visit and Type: 04/20/21, AWV prt 2 Last Controlled Substance Agreement Date: none Last UDS: none

## 2020-12-29 NOTE — Telephone Encounter (Signed)
  LAST APPOINTMENT DATE: 11/27/2020   NEXT APPOINTMENT DATE:@6 /03/2021  MEDICATION: Librax   PHARMACY: [facility] [address]   Patient is calling back to get a refill. States that the last time it took her almost 3 months to get the refill. EM  Let patient know to contact pharmacy at the end of the day to make sure medication is ready.  Please notify patient to allow 48-72 hours to process  Encourage patient to contact the pharmacy for refills or they can request refills through [facility]:   LAST REFILL:  QTY:  REFILL DATE:    OTHER COMMENTS:    Okay for refill?  Please advise

## 2021-01-01 MED ORDER — FAMOTIDINE 40 MG PO TABS
80.0000 mg | ORAL_TABLET | Freq: Every day | ORAL | 1 refills | Status: DC
Start: 2021-01-01 — End: 2021-03-29

## 2021-01-01 NOTE — Telephone Encounter (Signed)
Pt called to check status on this. States she wants a year supply as insurance will cover. She said she took her last pill this morning and she is struggling with her IBS so she is requesting it as soon as possible. I did explain to her that medication refills can take 2-3 business days.

## 2021-01-01 NOTE — Telephone Encounter (Signed)
Have sent in with 5 refills (which is the most that a controlled substance can be refilled)

## 2021-01-01 NOTE — Telephone Encounter (Signed)
Spoke with pt relaying Dr. Synthia Innocent message.  Pt verbalizes understanding and requests refill for famotidine also.  E-scribed refill. Pt aware.

## 2021-01-01 NOTE — Addendum Note (Signed)
Addended by: Brenton Grills on: 6/38/1771 16:57 PM   Modules accepted: Orders

## 2021-02-06 DIAGNOSIS — R339 Retention of urine, unspecified: Secondary | ICD-10-CM | POA: Diagnosis not present

## 2021-02-22 DIAGNOSIS — R399 Unspecified symptoms and signs involving the genitourinary system: Secondary | ICD-10-CM | POA: Diagnosis not present

## 2021-02-26 DIAGNOSIS — K589 Irritable bowel syndrome without diarrhea: Secondary | ICD-10-CM | POA: Diagnosis not present

## 2021-02-27 ENCOUNTER — Telehealth: Payer: Self-pay | Admitting: Family Medicine

## 2021-02-27 NOTE — Telephone Encounter (Signed)
LVM for pt to rtn my call to r/s appt with nha. 

## 2021-02-28 ENCOUNTER — Other Ambulatory Visit: Payer: Self-pay | Admitting: Family Medicine

## 2021-02-28 NOTE — Telephone Encounter (Signed)
Fill request Zofran Last refill 01/12/20 #20 Last office visit 07/28/20 Upcoming appointment 04/20/21

## 2021-03-08 DIAGNOSIS — H10502 Unspecified blepharoconjunctivitis, left eye: Secondary | ICD-10-CM | POA: Diagnosis not present

## 2021-03-26 ENCOUNTER — Ambulatory Visit: Payer: Medicare PPO

## 2021-03-29 ENCOUNTER — Other Ambulatory Visit: Payer: Self-pay | Admitting: Family Medicine

## 2021-04-12 ENCOUNTER — Other Ambulatory Visit: Payer: Self-pay | Admitting: Family Medicine

## 2021-04-12 DIAGNOSIS — E782 Mixed hyperlipidemia: Secondary | ICD-10-CM

## 2021-04-12 DIAGNOSIS — I10 Essential (primary) hypertension: Secondary | ICD-10-CM

## 2021-04-12 DIAGNOSIS — E538 Deficiency of other specified B group vitamins: Secondary | ICD-10-CM

## 2021-04-12 DIAGNOSIS — E611 Iron deficiency: Secondary | ICD-10-CM

## 2021-04-12 DIAGNOSIS — D75839 Thrombocytosis, unspecified: Secondary | ICD-10-CM

## 2021-04-12 DIAGNOSIS — E119 Type 2 diabetes mellitus without complications: Secondary | ICD-10-CM

## 2021-04-13 ENCOUNTER — Other Ambulatory Visit: Payer: Self-pay

## 2021-04-13 ENCOUNTER — Other Ambulatory Visit: Payer: Medicare PPO

## 2021-04-13 ENCOUNTER — Ambulatory Visit (INDEPENDENT_AMBULATORY_CARE_PROVIDER_SITE_OTHER): Payer: Medicare PPO

## 2021-04-13 DIAGNOSIS — Z20822 Contact with and (suspected) exposure to covid-19: Secondary | ICD-10-CM | POA: Diagnosis not present

## 2021-04-13 DIAGNOSIS — Z0000 Encounter for general adult medical examination without abnormal findings: Secondary | ICD-10-CM | POA: Diagnosis not present

## 2021-04-13 DIAGNOSIS — J069 Acute upper respiratory infection, unspecified: Secondary | ICD-10-CM | POA: Diagnosis not present

## 2021-04-13 NOTE — Progress Notes (Signed)
Subjective:   Shelby Vazquez is a 70 y.o. female who presents for Medicare Annual (Subsequent) preventive examination.  Review of Systems      I connected with the patient today by telephone and verified that I am speaking with the correct person using two identifiers. Location patient: home Location nurse: work Persons participating in the telephone visit: patient, nurse.   I discussed the limitations, risks, security and privacy concerns of performing an evaluation and management service by telephone and the availability of in person appointments. I also discussed with the patient that there may be a patient responsible charge related to this service. The patient expressed understanding and verbally consented to this telephonic visit.        Cardiac Risk Factors include: advanced age (>80men, >45 women);diabetes mellitus;Other (see comment), Risk factor comments: hyperlipidemia     Objective:    Today's Vitals   There is no height or weight on file to calculate BMI.  Advanced Directives 04/13/2021 03/24/2020 02/24/2019 07/16/2017 01/15/2017 03/24/2014 02/08/2014  Does Patient Have a Medical Advance Directive? No No No No No Patient does not have advance directive;Patient would not like information Patient does not have advance directive  Does patient want to make changes to medical advance directive? No - Patient declined - - - - - -  Would patient like information on creating a medical advance directive? - No - Patient declined No - Patient declined No - Patient declined No - Patient declined - -    Current Medications (verified) Outpatient Encounter Medications as of 04/13/2021  Medication Sig   acetaminophen (TYLENOL) 500 MG tablet Take 1 tablet (500 mg total) by mouth 2 (two) times a day.   aspirin 81 MG tablet Take 1 tablet (81 mg total) by mouth daily.   B-D 3CC LUER-LOK SYR 25GX1" 25G X 1" 3 ML MISC USE AS DIRECTED WITH  CYANOCOBALAMIN EVERY 30  DAYS   Calcium Carbonate-Vitamin  D 600-200 MG-UNIT CAPS Take 1 capsule by mouth daily.   clidinium-chlordiazePOXIDE (LIBRAX) 5-2.5 MG capsule Take 1 capsule by mouth 2 (two) times daily as needed (IBS symptoms).   cyanocobalamin (,VITAMIN B-12,) 1000 MCG/ML injection INJECT INTRAMUSCULARLY 1ML  EVERY 30 DAYS (DISCARD 28  DAYS AFTER FIRST USE)   famotidine (PEPCID) 40 MG tablet TAKE 2 TABLETS(80 MG) BY MOUTH AT BEDTIME   ferrous sulfate 325 (65 FE) MG tablet Take 1 tablet (325 mg total) by mouth daily with breakfast.   ibuprofen (ADVIL,MOTRIN) 200 MG tablet Take 600 mg by mouth every 6 (six) hours as needed for moderate pain.   loratadine (CLARITIN) 10 MG tablet Take 10 mg by mouth daily.   methenamine (HIPREX) 1 g tablet Take 1 g by mouth 2 (two) times daily with a meal.    metoprolol tartrate (LOPRESSOR) 50 MG tablet TAKE 1 TABLET BY MOUTH TWICE DAILY. MAY TAKE AN EXTRA 1/2 TO 1 TABLET FOR PALPITATIONS AND/OR BLOOD PRESSURE   ondansetron (ZOFRAN-ODT) 4 MG disintegrating tablet DISSOLVE 1 TABLET(4 MG) ON THE TONGUE EVERY 8 HOURS AS NEEDED FOR NAUSEA OR VOMITING   Peppermint Oil (IBGARD PO)    polyethylene glycol powder (GLYCOLAX/MIRALAX) 17 GM/SCOOP powder Take 1 Container by mouth daily. As needed    PRESCRIPTION MEDICATION Apply topically 2 (two) times daily. Estradiol Progesterone cream 0.05-30 mg/ml   Probiotic Product (PROBIOTIC PO) Take 2 capsules by mouth 2 (two) times daily.    RABEprazole (ACIPHEX) 20 MG tablet Take 20 mg by mouth daily.   rosuvastatin (CRESTOR)  20 MG tablet TAKE 1 TABLET(20 MG) BY MOUTH DAILY   sucralfate (CARAFATE) 1 GM/10ML suspension take 10 milliliters by mouth four times a day if needed   venlafaxine XR (EFFEXOR-XR) 75 MG 24 hr capsule Take 1 capsule (75 mg total) by mouth daily with breakfast.   Vitamin B Complex-C CAPS    No facility-administered encounter medications on file as of 04/13/2021.    Allergies (verified) Augmentin [amoxicillin-pot clavulanate] and Erythromycin   History: Past  Medical History:  Diagnosis Date   Abdominal aortic atherosclerosis ([name]) 10/2014   by CT scan   B12 deficiency    Chronic anxiety    Chronic cough    cyclical cough syndrome   Complication of anesthesia 9 yrs ago   awake and in severe pain with colonoscopy   Concussion    Diet-controlled diabetes mellitus ([name]) 02/22/2017   New dx 02/2017   GERD (gastroesophageal reflux disease)    severe leading to chronic cough and hoarseness   History of Epstein-Barr virus infection 2012   Hyperlipidemia    Hypertension    IBS (irritable bowel syndrome)    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, PB-8 probiotic, healthy diet and exercise   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   By CT 09/2017 & 09/2018 - max diameter 4cm f/u annually    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   had surgery with 5 days of chemotherapy   Osteopenia 01/2015   T -0.8 spine, -1.7 hip   Pyelonephritis due to Escherichia coli 07/16/2017   Uterine prolapse    Vocal cord paralysis, bilateral partial 2001   evaluated at [facility] - did not require surgery   Past Surgical History:  Procedure Laterality Date   BUNIONECTOMY     [place] '2000   CATARACT EXTRACTION     right 01/2010, left may 2011   COLONOSCOPY  03/2018   diverticulosis, rpt 5 yrs ([name])   COLONOSCOPY WITH PROPOFOL N/A 03/31/2014   sessile serrated adenomas, sigmoid diverticulosis, rpt 3 yrs Shelby Lobo V, MD)   CORONARY CALCIUM SCORE-CORONARY CTA  09/2017   Calcium score 53 . Normal right dominant coronary artery system with mild onset of CAD. Aneurysmal dilation of the ascending aorta with maximum diameter diameter of 4.0-3.8 centimeter. -Recommend annual CT/MRA December 2018   DILATION AND CURETTAGE OF UTERUS  '77   for molar pregnancy excision   ESOPHAGOGASTRODUODENOSCOPY N/A 03/31/2014   mult gastric polyps, mild mucosal hemorrhages; ESOPHAGOGASTRODUODENOSCOPY (EGD);  Surgeon: Shelby Nipper, MD   ESOPHAGOGASTRODUODENOSCOPY  ENDOSCOPY  april 2014   INTERSTIM IMPLANT PLACEMENT  2018   toe surgery Right 09/2018   TRANSTHORACIC ECHOCARDIOGRAM  09/2017   Normal LV size and function. EF 55-60%. GR 1 DD. No regional wall motion abnormalities. No significant valvular abnormalities.   Family History  Problem Relation Age of Onset   Cancer Father        Esophagus   Heart disease Mother 77   Hyperlipidemia Mother    Hypertension Mother    Heart attack Mother 32   Cancer Maternal Aunt        breast   Heart attack Maternal Grandmother    Heart attack Maternal Grandfather    Heart disease Paternal Grandmother    Heart disease Paternal Grandfather    Hyperlipidemia Brother    Hypertension Brother    Diabetes Neg Hx    Social History   Socioeconomic History   Marital status: Married    Spouse name: Not on file  Number of children: 2   Years of education: 16   Highest education level: Not on file  Occupational History   Occupation: Product manager: [employer]  Tobacco Use   Smoking status: Never   Smokeless tobacco: Never  Vaping Use   Vaping Use: Never used  Substance and Sexual Activity   Alcohol use: Yes    Comment: twice a year   Drug use: No   Sexual activity: Yes    Partners: Male  Other Topics Concern   Not on file  Social History Narrative   Married 1973. 2 daughters '74, '80; 5 grandchildren.    No h/o physical or sexual abuse. 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    Edu: [place].    Activity: stays active.   Diet: good water, fruits/vegetables daily   Social Determinants of Health   Financial Resource Strain: Low Risk    Difficulty of Paying Living Expenses: Not hard at all  Food Insecurity: No Food Insecurity   Worried About Charity fundraiser in the Last Year: Never true   Ran Out of Food in the Last Year: Never true  Transportation Needs: No Transportation Needs   Lack of Transportation  (Medical): No   Lack of Transportation (Non-Medical): No  Physical Activity: Insufficiently Active   Days of Exercise per Week: 7 days   Minutes of Exercise per Session: 10 min  Stress: No Stress Concern Present   Feeling of Stress : Not at all  Social Connections: Not on file    Tobacco Counseling Counseling given: Not Answered   Clinical Intake:  Pre-visit preparation completed: Yes  Pain : No/denies pain     Nutritional Risks: None Diabetes: Yes CBG done?: No Did pt. bring in CBG monitor from home?: No  How often do you need to have someone help you when you read instructions, pamphlets, or other written materials from your doctor or pharmacy?: 1 - Never  Diabetic: Yes Nutrition Risk Assessment:  Has the patient had any N/Vazquez/D within the last 2 months?  No  Does the patient have any non-healing wounds?  No  Has the patient had any unintentional weight loss or weight gain?  No   Diabetes:  Is the patient diabetic?  Yes  If diabetic, was a CBG obtained today?  No telephone visit  Did the patient bring in their glucometer from home?  No  telephone visit  How often do you monitor your CBG's? unknown.   Financial Strains and Diabetes Management:  Are you having any financial strains with the device, your supplies or your medication? No .  Does the patient want to be seen by Chronic Care Management for management of their diabetes?  No  Would the patient like to be referred to a Nutritionist or for Diabetic Management?  No   Diabetic Exams:  Diabetic Eye Exam: Completed 10/23/2020 Diabetic Foot Exam: Overdue, Pt has been advised about the importance in completing this exam. Pt is scheduled for diabetic foot exam on 04/20/2021.   Interpreter Needed?: No  Information entered by :: CJohnson, LPN   Activities of Daily Living In your present state of health, do you have any difficulty performing the following activities: 04/13/2021  Hearing? Y  Comment wears hearing  aids  Vision? N  Difficulty concentrating or making decisions? Y  Walking or climbing stairs? N  Dressing or bathing? N  Doing errands, shopping?  N  Preparing Food and eating ? N  Using the Toilet? N  In the past six months, have you accidently leaked urine? N  Do you have problems with loss of bowel control? N  Managing your Medications? N  Managing your Finances? N  Housekeeping or managing your Housekeeping? N  Some recent data might be hidden    Patient Care Team: Ria Bush, MD as PCP - General (Family Medicine) Dian Queen, MD (Obstetrics and Gynecology) Elsie Stain, MD as Attending Physician (Pulmonary Disease) Shelby Lobo, MD as Referring Physician (Gastroenterology) Jodi Marble, MD as Attending Physician (Otolaryngology)  Indicate any recent Medical Services you may have received from other than [facility] providers in the past year (date may be approximate).     Assessment:   This is a routine wellness examination for Wendell.  Hearing/Vision screen Vision Screening - Comments:: Patient gets annual eye exams   Dietary issues and exercise activities discussed: Current Exercise Habits: Home exercise routine, Type of exercise: walking, Time (Minutes): 15, Frequency (Times/Week): 7, Weekly Exercise (Minutes/Week): 105, Intensity: Moderate, Exercise limited by: None identified   Goals Addressed             This Visit's Progress    Patient Stated       04/13/2021, I will continue to walk daily for about 15 minutes.         Depression Screen PHQ 2/9 Scores 04/13/2021 03/24/2020 02/24/2019 02/18/2018 02/17/2017 02/12/2016  PHQ - 2 Score 0 0 0 0 2 0  PHQ- 9 Score 0 0 0 - 10 -    Fall Risk Fall Risk  04/13/2021 03/24/2020 02/24/2019 02/18/2018 02/17/2017  Falls in the past year? 0 0 1 No Yes  Comment - - fall with injury to buttocks; seen in ED - -  Number falls in past yr: 0 0 0 - 2 or more  Injury with Fall? 0 0 1 - Yes  Risk Factor Category  - - - - High  Fall Risk  Risk for fall due to : Medication side effect Medication side effect - - -  Risk for fall due to: Comment - - - - -  Follow up Falls evaluation completed;Falls prevention discussed Falls evaluation completed;Falls prevention discussed - - Falls prevention discussed    FALL RISK PREVENTION PERTAINING TO THE HOME:  Any stairs in or around the home? Yes  If so, are there any without handrails? No  Home free of loose throw rugs in walkways, pet beds, electrical cords, etc? Yes  Adequate lighting in your home to reduce risk of falls? Yes   ASSISTIVE DEVICES UTILIZED TO PREVENT FALLS:  Life alert? No  Use of a cane, walker or w/c? No  Grab bars in the bathroom? No  Shower chair or bench in shower? No  Elevated toilet seat or a handicapped toilet? No   TIMED UP AND GO:  Was the test performed?  N/A telephone visit .    Cognitive Function: MMSE - Mini Mental State Exam 04/13/2021 03/24/2020 02/24/2019  Not completed: Refused Refused -  Orientation to time - - 5  Orientation to Place - - 5  Registration - - 3  Attention/ Calculation - - 0  Recall - - 3  Language- name 2 objects - - 0  Language- repeat - - 1  Language- follow 3 step command - - 0  Language- read & follow direction - - 0  Write a sentence - - 0  Copy design - - 0  Total score - - 17  Mini Cog  Mini-Cog screen was not completed. Maximum score is 22. A value of 0 denotes this part of the MMSE was not completed or the patient failed this part of the Mini-Cog screening.       Immunizations Immunization History  Administered Date(s) Administered   Fluad Quad(high Dose 65+) 08/19/2019, 07/28/2020   Influenza Split 08/25/2009, 07/30/2010, 08/21/2012   Influenza Whole 07/25/2009, 08/29/2014   Influenza, High Dose Seasonal PF 09/12/2017, 08/20/2018   Influenza,inj,Quad PF,6+ Mos 07/23/2015, 07/23/2016   Influenza,inj,Quad PF,6-35 Mos 07/24/2013   PFIZER(Purple Top)SARS-COV-2 Vaccination 11/10/2019,  12/01/2019, 07/19/2020   Pneumococcal Conjugate-13 02/12/2016   Pneumococcal Polysaccharide-23 01/20/2013, 08/20/2018   Td 06/09/2010   Tdap 07/23/2015   Zoster Recombinat (Shingrix) 12/06/2019, 10/04/2020   Zoster, Live 07/30/2010, 04/28/2013    TDAP status: Up to date  Flu Vaccine status: Up to date  Pneumococcal vaccine status: Up to date  Covid-19 vaccine status: Completed 3vaccines  Qualifies for Shingles Vaccine? Yes   Zostavax completed Yes   Shingrix Completed?: Yes  Screening Tests Health Maintenance  Topic Date Due   FOOT EXAM  01/06/2020   HEMOGLOBIN A1C  10/13/2020   COVID-19 Vaccine (4 - Booster for Pfizer series) 10/18/2020   URINE MICROALBUMIN  04/13/2021   INFLUENZA VACCINE  05/21/2021   OPHTHALMOLOGY EXAM  10/23/2021   MAMMOGRAM  10/26/2021   TETANUS/TDAP  07/22/2025   COLONOSCOPY (Pts 45-67yrs Insurance coverage will need to be confirmed)  04/17/2028   DEXA SCAN  Completed   Hepatitis C Screening  Completed   PNA vac Low Risk Adult  Completed   Zoster Vaccines- Shingrix  Completed   HPV VACCINES  Aged Out    Health Maintenance  Health Maintenance Due  Topic Date Due   FOOT EXAM  01/06/2020   HEMOGLOBIN A1C  10/13/2020   COVID-19 Vaccine (4 - Booster for Pfizer series) 10/18/2020   URINE MICROALBUMIN  04/13/2021    Colorectal cancer screening: Type of screening: Colonoscopy. Completed 04/17/2018. Repeat every 10 years  Mammogram status: Completed 10/26/2020. Repeat every year  Bone Density status: due, Patient will schedule appointment soon.   Lung Cancer Screening: (Low Dose CT Chest recommended if Age 46-80 years, 30 pack-year currently smoking OR have quit w/in 15years.) does not qualify  Additional Screening:  Hepatitis C Screening: does qualify; Completed 02/07/2016  Vision Screening: Recommended annual ophthalmology exams for early detection of glaucoma and other disorders of the eye. Is the patient up to date with their annual eye  exam?  Yes  Who is the provider or what is the name of the office in which the patient attends annual eye exams? [facility] If pt is not established with a provider, would they like to be referred to a provider to establish care? No .   Dental Screening: Recommended annual dental exams for proper oral hygiene  Community Resource Referral / Chronic Care Management: CRR required this visit?  No   CCM required this visit?  No      Plan:     I have personally reviewed and noted the following in the patient's chart:   Medical and social history Use of alcohol, tobacco or illicit drugs  Current medications and supplements including opioid prescriptions.  Functional ability and status Nutritional status Physical activity Advanced directives List of other physicians Hospitalizations, surgeries, and ER visits in previous 12 months Vitals Screenings to include cognitive, depression, and falls Referrals and appointments  In addition, I have reviewed and  discussed with patient certain preventive protocols, quality metrics, and best practice recommendations. A written personalized care plan for preventive services as well as general preventive health recommendations were provided to patient.   Due to this being a telephonic visit, the after visit summary with patients personalized plan was offered to patient via office or my-chart. Patient preferred to pick up at office at next visit or via mychart.   Andrez Grime, LPN   02/09/311

## 2021-04-13 NOTE — Patient Instructions (Signed)
Shelby Vazquez , Thank you for taking time to come for your Medicare Wellness Visit. I appreciate your ongoing commitment to your health goals. Please review the following plan we discussed and let me know if I can assist you in the future.   Screening recommendations/referrals: Colonoscopy: Up to date, completed 04/17/2018, due 03/2028 Mammogram: Up to date, completed 10/26/2020, due 10/2021 Bone Density: due, Please call and schedule appointment  Recommended yearly ophthalmology/optometry visit for glaucoma screening and checkup Recommended yearly dental visit for hygiene and checkup  Vaccinations: Influenza vaccine: Up to date, completed 07/28/2020, due 05/2021 Pneumococcal vaccine: Completed series Tdap vaccine: Up to date, completed 07/23/2015, due 07/2025 Shingles vaccine: Completed series   Covid-19:completed 3 vaccines   Advanced directives: Advance directive discussed with you today. Even though you declined this today please call our office should you change your mind and we can give you the proper paperwork for you to fill out.  Conditions/risks identified: diabetes, hyperlipidemia   Next appointment: Follow up in one year for your annual wellness visit    Preventive Care 70 Years and Older, Female Preventive care refers to lifestyle choices and visits with your health care provider that can promote health and wellness. What does preventive care include? A yearly physical exam. This is also called an annual well check. Dental exams once or twice a year. Routine eye exams. Ask your health care provider how often you should have your eyes checked. Personal lifestyle choices, including: Daily care of your teeth and gums. Regular physical activity. Eating a healthy diet. Avoiding tobacco and drug use. Limiting alcohol use. Practicing safe sex. Taking low-dose aspirin every day. Taking vitamin and mineral supplements as recommended by your health care provider. What happens during an  annual well check? The services and screenings done by your health care provider during your annual well check will depend on your age, overall health, lifestyle risk factors, and family history of disease. Counseling  Your health care provider may ask you questions about your: Alcohol use. Tobacco use. Drug use. Emotional well-being. Home and relationship well-being. Sexual activity. Eating habits. History of falls. Memory and ability to understand (cognition). Work and work Statistician. Reproductive health. Screening  You may have the following tests or measurements: Height, weight, and BMI. Blood pressure. Lipid and cholesterol levels. These may be checked every 5 years, or more frequently if you are over 70 years old. Skin check. Lung cancer screening. You may have this screening every year starting at age 70 if you have a 30-pack-year history of smoking and currently smoke or have quit within the past 15 years. Fecal occult blood test (FOBT) of the stool. You may have this test every year starting at age 70. Flexible sigmoidoscopy or colonoscopy. You may have a sigmoidoscopy every 5 years or a colonoscopy every 10 years starting at age 70. Hepatitis C blood test. Hepatitis B blood test. Sexually transmitted disease (STD) testing. Diabetes screening. This is done by checking your blood sugar (glucose) after you have not eaten for a while (fasting). You may have this done every 1-3 years. Bone density scan. This is done to screen for osteoporosis. You may have this done starting at age 70. Mammogram. This may be done every 1-2 years. Talk to your health care provider about how often you should have regular mammograms. Talk with your health care provider about your test results, treatment options, and if necessary, the need for more tests. Vaccines  Your health care provider may recommend certain vaccines,  such as: Influenza vaccine. This is recommended every year. Tetanus,  diphtheria, and acellular pertussis (Tdap, Td) vaccine. You may need a Td booster every 10 years. Zoster vaccine. You may need this after age 49. Pneumococcal 13-valent conjugate (PCV13) vaccine. One dose is recommended after age 70. Pneumococcal polysaccharide (PPSV23) vaccine. One dose is recommended after age 70. Talk to your health care provider about which screenings and vaccines you need and how often you need them. This information is not intended to replace advice given to you by your health care provider. Make sure you discuss any questions you have with your health care provider. Document Released: 11/03/2015 Document Revised: 06/26/2016 Document Reviewed: 08/08/2015 Elsevier Interactive Patient Education  2017 [place] Prevention in the Home Falls can cause injuries. They can happen to people of all ages. There are many things you can do to make your home safe and to help prevent falls. What can I do on the outside of my home? Regularly fix the edges of walkways and driveways and fix any cracks. Remove anything that might make you trip as you walk through a door, such as a raised step or threshold. Trim any bushes or trees on the path to your home. Use bright outdoor lighting. Clear any walking paths of anything that might make someone trip, such as rocks or tools. Regularly check to see if handrails are loose or broken. Make sure that both sides of any steps have handrails. Any raised decks and porches should have guardrails on the edges. Have any leaves, snow, or ice cleared regularly. Use sand or salt on walking paths during winter. Clean up any spills in your garage right away. This includes oil or grease spills. What can I do in the bathroom? Use night lights. Install grab bars by the toilet and in the tub and shower. Do not use towel bars as grab bars. Use non-skid mats or decals in the tub or shower. If you need to sit down in the shower, use a plastic,  non-slip stool. Keep the floor dry. Clean up any water that spills on the floor as soon as it happens. Remove soap buildup in the tub or shower regularly. Attach bath mats securely with double-sided non-slip rug tape. Do not have throw rugs and other things on the floor that can make you trip. What can I do in the bedroom? Use night lights. Make sure that you have a light by your bed that is easy to reach. Do not use any sheets or blankets that are too big for your bed. They should not hang down onto the floor. Have a firm chair that has side arms. You can use this for support while you get dressed. Do not have throw rugs and other things on the floor that can make you trip. What can I do in the kitchen? Clean up any spills right away. Avoid walking on wet floors. Keep items that you use a lot in easy-to-reach places. If you need to reach something above you, use a strong step stool that has a grab bar. Keep electrical cords out of the way. Do not use floor polish or wax that makes floors slippery. If you must use wax, use non-skid floor wax. Do not have throw rugs and other things on the floor that can make you trip. What can I do with my stairs? Do not leave any items on the stairs. Make sure that there are handrails on both sides of the stairs and  use them. Fix handrails that are broken or loose. Make sure that handrails are as long as the stairways. Check any carpeting to make sure that it is firmly attached to the stairs. Fix any carpet that is loose or worn. Avoid having throw rugs at the top or bottom of the stairs. If you do have throw rugs, attach them to the floor with carpet tape. Make sure that you have a light switch at the top of the stairs and the bottom of the stairs. If you do not have them, ask someone to add them for you. What else can I do to help prevent falls? Wear shoes that: Do not have high heels. Have rubber bottoms. Are comfortable and fit you well. Are closed  at the toe. Do not wear sandals. If you use a stepladder: Make sure that it is fully opened. Do not climb a closed stepladder. Make sure that both sides of the stepladder are locked into place. Ask someone to hold it for you, if possible. Clearly mark and make sure that you can see: Any grab bars or handrails. First and last steps. Where the edge of each step is. Use tools that help you move around (mobility aids) if they are needed. These include: Canes. Walkers. Scooters. Crutches. Turn on the lights when you go into a dark area. Replace any light bulbs as soon as they burn out. Set up your furniture so you have a clear path. Avoid moving your furniture around. If any of your floors are uneven, fix them. If there are any pets around you, be aware of where they are. Review your medicines with your doctor. Some medicines can make you feel dizzy. This can increase your chance of falling. Ask your doctor what other things that you can do to help prevent falls. This information is not intended to replace advice given to you by your health care provider. Make sure you discuss any questions you have with your health care provider. Document Released: 08/03/2009 Document Revised: 03/14/2016 Document Reviewed: 11/11/2014 Elsevier Interactive Patient Education  2017 [employer].

## 2021-04-13 NOTE — Progress Notes (Signed)
PCP notes:  Health Maintenance: Dexa- due Foot[place] exam- due    Abnormal Screenings: none   Patient concerns: none   Nurse concerns: none   Next PCP appt.: 04/20/2021 @ 9:30 am

## 2021-04-17 ENCOUNTER — Other Ambulatory Visit: Payer: Self-pay

## 2021-04-17 ENCOUNTER — Other Ambulatory Visit (INDEPENDENT_AMBULATORY_CARE_PROVIDER_SITE_OTHER): Payer: Medicare PPO

## 2021-04-17 DIAGNOSIS — I10 Essential (primary) hypertension: Secondary | ICD-10-CM

## 2021-04-17 DIAGNOSIS — E782 Mixed hyperlipidemia: Secondary | ICD-10-CM

## 2021-04-17 DIAGNOSIS — E538 Deficiency of other specified B group vitamins: Secondary | ICD-10-CM | POA: Diagnosis not present

## 2021-04-17 DIAGNOSIS — E119 Type 2 diabetes mellitus without complications: Secondary | ICD-10-CM

## 2021-04-17 DIAGNOSIS — D75839 Thrombocytosis, unspecified: Secondary | ICD-10-CM | POA: Diagnosis not present

## 2021-04-17 DIAGNOSIS — E611 Iron deficiency: Secondary | ICD-10-CM

## 2021-04-17 LAB — COMPREHENSIVE METABOLIC PANEL
ALT: 23 U/L (ref 0–35)
AST: 25 U/L (ref 0–37)
Albumin: 4.7 g/dL (ref 3.5–5.2)
Alkaline Phosphatase: 53 U/L (ref 39–117)
BUN: 16 mg/dL (ref 6–23)
CO2: 28 mEq/L (ref 19–32)
Calcium: 10 mg/dL (ref 8.4–10.5)
Chloride: 100 mEq/L (ref 96–112)
Creatinine, Ser: 0.85 mg/dL (ref 0.40–1.20)
GFR: 69.48 mL/min (ref >60.00)
Glucose, Bld: 85 mg/dL (ref 70–99)
Potassium: 3.8 mEq/L (ref 3.5–5.1)
Sodium: 136 mEq/L (ref 135–145)
Total Bilirubin: 0.7 mg/dL (ref 0.2–1.2)
Total Protein: 6.8 g/dL (ref 6.0–8.3)

## 2021-04-17 LAB — CBC WITH DIFFERENTIAL/PLATELET
Basophils Absolute: 0.1 10*3/uL (ref 0.0–0.1)
Basophils Relative: 0.6 % (ref 0.0–3.0)
Eosinophils Absolute: 0.1 10*3/uL (ref 0.0–0.7)
Eosinophils Relative: 0.7 % (ref 0.0–5.0)
HCT: 39.5 % (ref 36.0–46.0)
Hemoglobin: 13.4 g/dL (ref 12.0–15.0)
Lymphocytes Relative: 34.2 % (ref 12.0–46.0)
Lymphs Abs: 3.7 10*3/uL (ref 0.7–4.0)
MCHC: 34.1 g/dL (ref 30.0–36.0)
MCV: 89.8 fl (ref 78.0–100.0)
Monocytes Absolute: 1.1 10*3/uL — ABNORMAL HIGH (ref 0.1–1.0)
Monocytes Relative: 9.8 % (ref 3.0–12.0)
Neutro Abs: 6 10*3/uL (ref 1.4–7.7)
Neutrophils Relative %: 54.7 % (ref 43.0–77.0)
Platelets: 390 10*3/uL (ref 150.0–400.0)
RBC: 4.4 Mil/uL (ref 3.87–5.11)
RDW: 13.4 % (ref 11.5–15.5)
WBC: 10.9 10*3/uL — ABNORMAL HIGH (ref 4.0–10.5)

## 2021-04-17 LAB — IBC PANEL
Iron: 126 ug/dL (ref 42–145)
Saturation Ratios: 35.2 % (ref 20.0–50.0)
Transferrin: 256 mg/dL (ref 212.0–360.0)

## 2021-04-17 LAB — VITAMIN B12: Vitamin B-12: >1550 pg/mL — ABNORMAL HIGH (ref 211–911)

## 2021-04-17 LAB — LIPID PANEL
Cholesterol: 137 mg/dL (ref 0–200)
HDL: 43.4 mg/dL (ref >39.00)
LDL Cholesterol: 64 mg/dL (ref 0–99)
NonHDL: 93.26
Total CHOL/HDL Ratio: 3
Triglycerides: 146 mg/dL (ref 0.0–149.0)
VLDL: 29.2 mg/dL (ref 0.0–40.0)

## 2021-04-17 LAB — FERRITIN: Ferritin: 160.9 ng/mL (ref 10.0–291.0)

## 2021-04-17 LAB — MICROALBUMIN / CREATININE URINE RATIO
Creatinine,U: 106.7 mg/dL
Microalb Creat Ratio: 1.7 mg/g (ref 0.0–30.0)
Microalb, Ur: 1.8 mg/dL (ref 0.0–1.9)

## 2021-04-17 LAB — HEMOGLOBIN A1C: Hgb A1c MFr Bld: 6.6 % — ABNORMAL HIGH (ref 4.6–6.5)

## 2021-04-18 ENCOUNTER — Other Ambulatory Visit: Payer: Medicare PPO

## 2021-04-20 ENCOUNTER — Encounter: Payer: Medicare PPO | Admitting: Family Medicine

## 2021-04-24 DIAGNOSIS — Z20822 Contact with and (suspected) exposure to covid-19: Secondary | ICD-10-CM | POA: Diagnosis not present

## 2021-04-25 ENCOUNTER — Other Ambulatory Visit: Payer: Self-pay | Admitting: Family Medicine

## 2021-05-07 DIAGNOSIS — R339 Retention of urine, unspecified: Secondary | ICD-10-CM | POA: Diagnosis not present

## 2021-05-17 DIAGNOSIS — N958 Other specified menopausal and perimenopausal disorders: Secondary | ICD-10-CM | POA: Diagnosis not present

## 2021-05-17 DIAGNOSIS — M8588 Other specified disorders of bone density and structure, other site: Secondary | ICD-10-CM | POA: Diagnosis not present

## 2021-05-24 ENCOUNTER — Other Ambulatory Visit: Payer: Self-pay | Admitting: Family Medicine

## 2021-06-18 ENCOUNTER — Other Ambulatory Visit: Payer: Self-pay | Admitting: Family Medicine

## 2021-06-18 DIAGNOSIS — R3915 Urgency of urination: Secondary | ICD-10-CM | POA: Diagnosis not present

## 2021-06-18 DIAGNOSIS — R350 Frequency of micturition: Secondary | ICD-10-CM | POA: Diagnosis not present

## 2021-06-18 DIAGNOSIS — R339 Retention of urine, unspecified: Secondary | ICD-10-CM | POA: Diagnosis not present

## 2021-06-18 DIAGNOSIS — N390 Urinary tract infection, site not specified: Secondary | ICD-10-CM | POA: Diagnosis not present

## 2021-06-18 DIAGNOSIS — N3281 Overactive bladder: Secondary | ICD-10-CM | POA: Diagnosis not present

## 2021-06-18 NOTE — Telephone Encounter (Signed)
ERx 

## 2021-06-22 ENCOUNTER — Telehealth: Payer: Self-pay | Admitting: Family Medicine

## 2021-06-22 MED ORDER — CILIDINIUM-CHLORDIAZEPOXIDE 2.5-5 MG PO CAPS: 1.0000 | ORAL_CAPSULE | Freq: Two times a day (BID) | ORAL | 5 refills | Status: DC

## 2021-06-22 NOTE — Telephone Encounter (Signed)
Spoke with pt asking how she takes med.  Says she takes BID and has always taken that way.  I mentioned directions state, "PRN".  But pt states again, she's always taken it that way.  I relayed Dr. Synthia Innocent message.  Pt verbalizes understanding and agrees to several refills at a time.

## 2021-06-22 NOTE — Telephone Encounter (Signed)
How is she taking this exactly? This is a controlled substance that should be taken PRN. Is she taking scheduled?  Don't recommend more than 1 month at a time for controlled substances. We could do several refills at a time however

## 2021-06-22 NOTE — Telephone Encounter (Signed)
Shelby Vazquez called in and wanted to ask Dr. Darnell Level to reach out to her insurance so she can get her prescription clidinium-chlordiazePOXIDE (LIBRAX) 5-2.5 MG capsule.  She wanted to get for 6 months not a month at a time.

## 2021-06-22 NOTE — Telephone Encounter (Signed)
ERx 

## 2021-06-22 NOTE — Addendum Note (Signed)
Addended by: Ria Bush on: 06/22/2021 05:22 PM   Modules accepted: Orders

## 2021-06-24 DIAGNOSIS — H60392 Other infective otitis externa, left ear: Secondary | ICD-10-CM | POA: Diagnosis not present

## 2021-06-24 DIAGNOSIS — H6690 Otitis media, unspecified, unspecified ear: Secondary | ICD-10-CM | POA: Diagnosis not present

## 2021-06-28 ENCOUNTER — Telehealth (INDEPENDENT_AMBULATORY_CARE_PROVIDER_SITE_OTHER): Payer: Medicare PPO | Admitting: Cardiology

## 2021-06-28 ENCOUNTER — Telehealth: Payer: Self-pay | Admitting: *Deleted

## 2021-06-28 ENCOUNTER — Encounter: Payer: Self-pay | Admitting: Cardiology

## 2021-06-28 DIAGNOSIS — I700 Atherosclerosis of aorta: Secondary | ICD-10-CM | POA: Diagnosis not present

## 2021-06-28 DIAGNOSIS — I10 Essential (primary) hypertension: Secondary | ICD-10-CM | POA: Diagnosis not present

## 2021-06-28 DIAGNOSIS — E785 Hyperlipidemia, unspecified: Secondary | ICD-10-CM | POA: Diagnosis not present

## 2021-06-28 DIAGNOSIS — R002 Palpitations: Secondary | ICD-10-CM | POA: Diagnosis not present

## 2021-06-28 NOTE — Assessment & Plan Note (Signed)
Well-controlled on metoprolol.  No further issues.

## 2021-06-28 NOTE — Progress Notes (Signed)
Virtual Visit via Video Note   This visit type was conducted due to national recommendations for restrictions regarding the COVID-19 Pandemic (e.g. social distancing) in an effort to limit this patient's exposure and mitigate transmission in our community.  Due to her co-morbid illnesses, this patient is at least at moderate risk for complications without adequate follow up.  This format is felt to be most appropriate for this patient at this time.  All issues noted in this document were discussed and addressed.  A limited physical exam was performed with this format.  Please refer to the patient's chart for her consent to telehealth for [facility].   Patient has given verbal permission to conduct this visit via virtual appointment and to bill insurance 06/28/2021 2:42 PM     Evaluation Performed:  Follow-up visit  Date:  06/28/2021   ID:  Shelby Vazquez, Super 01/08/1951, MRN [id number]  Patient Location: Home Provider Location: Office/Clinic  PCP:  Ria Bush, MD  Cardiologist:  Glenetta Hew, MD  Electrophysiologist:  None   Chief Complaint:   Chief Complaint  Patient presents with   Follow-up    Annual follow-up.  Doing well.    ====================================  ASSESSMENT & PLAN:    Problem List Items Addressed This Visit       Cardiology Problems   Essential hypertension (Chronic)    She says that the blood pressure today is actually relatively high for her.  Has been pretty well controlled on current meds.  She is really only on standing dose of Lopressor but no other medications.      Abdominal aortic atherosclerosis (HCC) (Chronic)    Both thoracic and abdominal aortic calcification, not unexpected.  Continue to treat lipids and blood pressure.  Did not necessarily have any significant dilation, therefore no need to follow at this point for AAA or TAA.      Hyperlipidemia with target LDL less than 100 (Chronic)    Patient was last of the labs, LDL  was 64.  On stable dose of rosuvastatin.  Tolerating well.  Continue with her increased level of exercise and monitoring diet.        Other   Rapid palpitations (Chronic)    Well-controlled on metoprolol.  No further issues.       ====================================  History of Present Illness:    Shelby Vazquez is a 70 y.o. female with family history of CAD (coronary CTA showing nonobstructive CAD) with PMH notable for TIA and palpitations along with exertional dyspnea who presents via audio/video conferencing for a telehealth visit today as a annual follow-up.  ROSCHELL Vazquez was last seen 07/13/2020.  She was doing fairly well.  No major cardiac issues.  Just noticing fatigue.  Had recovered from a bout of IBS/colitis.  Noted to have iron deficiency anemia with Feraheme treatment x2.  Very symptomatic with anemia. -> Plan was to recheck CTA abdomen pelvis next year.  Continue target LDL <100 and adequate BP control.  Continue aspirin.  Hospitalizations:  none  Recent - Interim CV studies:   The following studies were reviewed today: none:  Inerval History   Shelby Vazquez is being seen today via video visit for annual follow-up doing very well.  She says actually that she is been doing pretty well over the last year.  Her anemia issues seem to have resolved based on her last labs in June.  This last couple weeks she did feel little more fatigued than usual-now recovering  from a ear infection and also just worn out with the school starting back and having to get her grandkids to and from school etc.  She is hoping that as things start to normalize, that she can actually take advantage of the mornings that it would be free to go and do Silver sneakers. She actually tells me that she went on a trip out to [place] to visit her niece.  But majority the visit was her husband planning lots of different outings including 4 to 6 mile hikes in the mountains.  She did outstandingly well with  no symptoms of chest pain or pressure.  Minimal exertional dyspnea.  Cardiovascular ROS: no chest pain or dyspnea on exertion negative for - edema, irregular heartbeat, orthopnea, palpitations, paroxysmal nocturnal dyspnea, rapid heart rate, shortness of breath, or syncope or near syncope, TIA/amaurosis fugax.  Claudication  In addition to having to be a caregiver for her grandkids, she also has the added stress of being the executor of her brothers will.  He died recently in 2023/01/10.  This has been hard a for losing her brother, but be also having to do the funeral arrangements and dealing with the estate.   ROS:  Please see the history of present illness.    Review of Systems  Constitutional:  Positive for malaise/fatigue (A little more so than usual over the last couple weeks but not for the last several months.). Negative for weight loss.  HENT:  Negative for congestion and nosebleeds.        Recovering from an ear infection  Respiratory:  Negative for cough and shortness of breath.   Cardiovascular:        Per HPI  Musculoskeletal:  Positive for joint pain (Mild aches and pains).  Neurological:  Positive for dizziness (A little bit of vertigo with her head ear infection). Negative for focal weakness and weakness.  Psychiatric/Behavioral:  Negative for memory loss. The patient is not nervous/anxious and does not have insomnia.    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   Hyperlipidemia    Hypertension    IBS (irritable bowel syndrome)     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, PB-8 probiotic, healthy diet and exercise   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   By CT 09/2017 & 09/2018 - max diameter 4cm f/u annually    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   had surgery with 5 days of chemotherapy   Osteopenia 01/2015   T -0.8 spine, -1.7 hip   Pyelonephritis due to Escherichia coli 07/16/2017   Uterine prolapse    Vocal cord paralysis, bilateral partial 2000/01/10   evaluated at [facility] - did not require surgery   Past Surgical History:  Procedure Laterality Date   BUNIONECTOMY     [place] 01/10/1999   CATARACT EXTRACTION     right 01/2010, left may 2011   COLONOSCOPY  03/2018   diverticulosis, rpt 5 yrs ([name])   COLONOSCOPY WITH PROPOFOL N/A 03/31/2014  sessile serrated adenomas, sigmoid diverticulosis, rpt 3 yrs Ronald Lobo V, MD)   CORONARY CALCIUM SCORE-CORONARY CTA  09/2017   Calcium score 53 . Normal right dominant coronary artery system with mild onset of CAD. Aneurysmal dilation of the ascending aorta with maximum diameter diameter of 4.0-3.8 centimeter. -Recommend annual CT/MRA December 2018   DILATION AND CURETTAGE OF UTERUS  '77   for molar pregnancy excision   ESOPHAGOGASTRODUODENOSCOPY N/A 03/31/2014   mult gastric polyps, mild mucosal hemorrhages; ESOPHAGOGASTRODUODENOSCOPY (EGD);  Surgeon: Cleotis Nipper, MD   ESOPHAGOGASTRODUODENOSCOPY ENDOSCOPY  april 2014   INTERSTIM IMPLANT PLACEMENT  2018   toe surgery Right 09/2018   TRANSTHORACIC ECHOCARDIOGRAM  09/2017   Normal LV size and function. EF 55-60%. GR 1 DD. No regional wall motion abnormalities. No significant valvular abnormalities.     Current Meds  Medication Sig   aspirin 81 MG tablet Take 1 tablet (81 mg total) by mouth daily.   B-D 3CC LUER-LOK SYR 25GX1" 25G X 1" 3 ML MISC USE AS DIRECTED WITH  CYANOCOBALAMIN EVERY 30  DAYS   Calcium Carbonate-Vitamin D 600-200 MG-UNIT CAPS Take 1 capsule by mouth daily.    clidinium-chlordiazePOXIDE (LIBRAX) 5-2.5 MG capsule Take 1 capsule by mouth in the morning and at bedtime.   cyanocobalamin (,VITAMIN B-12,) 1000 MCG/ML injection INJECT INTRAMUSCULARLY 1ML  EVERY 30 DAYS (DISCARD 28  DAYS AFTER FIRST USE)   famotidine (PEPCID) 40 MG tablet TAKE 2 TABLETS(80 MG) BY MOUTH AT BEDTIME   ibuprofen (ADVIL,MOTRIN) 200 MG tablet Take 600 mg by mouth every 6 (six) hours as needed for moderate pain.   loratadine (CLARITIN) 10 MG tablet Take 10 mg by mouth daily.   metoprolol tartrate (LOPRESSOR) 50 MG tablet TAKE 1 TABLET BY MOUTH TWICE DAILY. MAY TAKE AN EXTRA 1/2 TO 1 TABLET FOR PALPITATIONS AND/OR BLOOD PRESSURE   ondansetron (ZOFRAN-ODT) 4 MG disintegrating tablet DISSOLVE 1 TABLET(4 MG) ON THE TONGUE EVERY 8 HOURS AS NEEDED FOR NAUSEA OR VOMITING   Peppermint Oil (IBGARD PO)    polyethylene glycol powder (GLYCOLAX/MIRALAX) 17 GM/SCOOP powder Take 1 Container by mouth daily. As needed    PRESCRIPTION MEDICATION Apply topically 2 (two) times daily. Estradiol Progesterone cream 0.05-30 mg/ml   Probiotic Product (PROBIOTIC PO) Take 2 capsules by mouth 2 (two) times daily.    RABEprazole (ACIPHEX) 20 MG tablet Take 20 mg by mouth daily.   rosuvastatin (CRESTOR) 20 MG tablet TAKE 1 TABLET(20 MG) BY MOUTH DAILY   sucralfate (CARAFATE) 1 GM/10ML suspension take 10 milliliters by mouth four times a day if needed   venlafaxine XR (EFFEXOR-XR) 75 MG 24 hr capsule TAKE 1 CAPSULE(75 MG) BY MOUTH DAILY WITH BREAKFAST   Vitamin B Complex-C CAPS      Allergies:   Augmentin [amoxicillin-pot clavulanate] and Erythromycin   Social History   Tobacco Use   Smoking status: Never   Smokeless tobacco: Never  Vaping Use   Vaping Use: Never used  Substance Use Topics   Alcohol use: Yes    Comment: twice a year   Drug use: No     Family Hx: The patient's family history includes Cancer in her father and maternal aunt; Heart attack in her maternal grandfather and maternal  grandmother; Heart attack (age of onset: 86) in her mother; Heart disease in her paternal grandfather and paternal grandmother; Heart disease (age of onset: 44) in her mother; Hyperlipidemia in her brother and mother; Hypertension in her brother and mother. There is no history of  Diabetes.   Labs/Other Tests and Data Reviewed:    EKG:  No ECG reviewed.  Recent Labs: 04/17/2021: ALT 23; BUN 16; Creatinine, Ser 0.85; Hemoglobin 13.4; Platelets 390.0; Potassium 3.8; Sodium 136   Recent Lipid Panel Lab Results  Component Value Date/Time   CHOL 137 04/17/2021 01:32 PM   TRIG 146.0 04/17/2021 01:32 PM   HDL 43.40 04/17/2021 01:32 PM   CHOLHDL 3 04/17/2021 01:32 PM   LDLCALC 64 04/17/2021 01:32 PM   LDLDIRECT 109.0 12/25/2017 08:35 AM    Wt Readings from Last 3 Encounters:  06/28/21 151 lb (68.5 kg)  07/28/20 156 lb 8 oz (71 kg)  07/13/20 158 lb 12.8 oz (72 kg)     Objective:    Vital Signs:  BP 133/90   Pulse 74   Ht 5' 4.5" (1.638 m)   Wt 151 lb (68.5 kg)   BMI 25.52 kg/m   VITAL SIGNS:  reviewed GEN:  no acute distress RESPIRATORY:  normal respiratory effort, symmetric expansion NEURO:  alert and oriented x 3, no obvious focal deficit PSYCH:  normal affect  ==========================================  COVID-19 Education: The signs and symptoms of COVID-19 were discussed with the patient and how to seek care for testing (follow up with PCP or arrange E-visit).   The importance of social distancing was discussed today.  Time:   Today, I have spent 19 minutes with the patient with telehealth technology discussing the above problems.   An additional 68mnutes spent charting (reviewing prior notes, hospital records, studies, labs etc.) Total 31 minutes   Medication Adjustments/Labs and Tests Ordered: Current medicines are reviewed at length with the patient today.  Concerns regarding medicines are outlined above.   Patient Instructions  Medication Instructions:  No  change *If you need a refill on your cardiac medications before your next appointment, please call your pharmacy*   Lab Work: your labs from [place] none If you have labs (blood work) drawn today and your tests are completely normal, you will receive your results only by: [place](if you have MyChart) OR A paper copy in the mail If you have any lab test that is abnormal or we need to change your treatment, we will call you to review the results.   Testing/Procedures: N/a   Follow-Up: [facility] you and your health needs are our priority.  As part of our continuing mission to provide you with exceptional heart care, we have created designated Provider Care Teams.  These Care Teams include your primary Cardiologist (physician) and Advanced Practice Providers (APPs -  Physician Assistants and Nurse Practitioners) who all work together to provide you with the care you need, when you need it.  We recommend signing up for the patient portal called "MyChart".  Sign up information is provided on this After Visit Summary.  MyChart is used to connect with patients for Virtual Visits (Telemedicine).  Patients are able to view lab/test results, encounter notes, upcoming appointments, etc.  Non-urgent messages can be sent to your provider as well.   To learn more about what you can do with MyChart, go to [email or web address]    Your next appointment:   1 year(s)  The format for your next appointment:   In Person  Provider:   DGlenetta Hew MD   Other Instructions Keep staying active - - good luck with Silver Sneakers   Signed, DGlenetta Hew MD  06/28/2021 2:42 PM    [place]

## 2021-06-28 NOTE — Assessment & Plan Note (Signed)
She says that the blood pressure today is actually relatively high for her.  Has been pretty well controlled on current meds.  She is really only on standing dose of Lopressor but no other medications.

## 2021-06-28 NOTE — Assessment & Plan Note (Addendum)
Patient was last of the labs, LDL was 64.  On stable dose of rosuvastatin.  Tolerating well.  Continue with her increased level of exercise and monitoring diet.

## 2021-06-28 NOTE — Patient Instructions (Signed)
Medication Instructions:  No change *If you need a refill on your cardiac medications before your next appointment, please call your pharmacy*   Lab Work: your labs from June look [place]. none If you have labs (blood work) drawn today and your tests are completely normal, you will receive your results only by: [place] (if you have MyChart) OR A paper copy in the mail If you have any lab test that is abnormal or we need to change your treatment, we will call you to review the results.   Testing/Procedures: N/a   Follow-Up: At [facility], you and your health needs are our priority.  As part of our continuing mission to provide you with exceptional heart care, we have created designated Provider Care Teams.  These Care Teams include your primary Cardiologist (physician) and Advanced Practice Providers (APPs -  Physician Assistants and Nurse Practitioners) who all work together to provide you with the care you need, when you need it.  We recommend signing up for the patient portal called "MyChart".  Sign up information is provided on this After Visit Summary.  MyChart is used to connect with patients for Virtual Visits (Telemedicine).  Patients are able to view lab/test results, encounter notes, upcoming appointments, etc.  Non-urgent messages can be sent to your provider as well.   To learn more about what you can do with MyChart, go to [email or web address].    Your next appointment:   1 year(s)  The format for your next appointment:   In Person  Provider:   Glenetta Hew, MD   Other Instructions Keep staying active - - good luck with Silver Sneakers

## 2021-06-28 NOTE — Assessment & Plan Note (Signed)
Both thoracic and abdominal aortic calcification, not unexpected.  Continue to treat lipids and blood pressure.  Did not necessarily have any significant dilation, therefore no need to follow at this point for AAA or TAA.

## 2021-06-28 NOTE — Telephone Encounter (Signed)
  Patient Consent for Virtual Visit        Shelby Vazquez has provided verbal consent on 06/28/2021 for a virtual visit (video or telephone).   CONSENT FOR VIRTUAL VISIT FOR:  Shelby Vazquez  By participating in this virtual visit I agree to the following:  I hereby voluntarily request, consent and authorize [facility] and its employed or contracted physicians, physician assistants, nurse practitioners or other licensed health care professionals (the Practitioner), to provide me with telemedicine health care services (the "Services") as deemed necessary by the treating Practitioner. I acknowledge and consent to receive the Services by the Practitioner via telemedicine. I understand that the telemedicine visit will involve communicating with the Practitioner through live audiovisual communication technology and the disclosure of certain medical information by electronic transmission. I acknowledge that I have been given the opportunity to request an in-person assessment or other available alternative prior to the telemedicine visit and am voluntarily participating in the telemedicine visit.  I understand that I have the right to withhold or withdraw my consent to the use of telemedicine in the course of my care at any time, without affecting my right to future care or treatment, and that the Practitioner or I may terminate the telemedicine visit at any time. I understand that I have the right to inspect all information obtained and/or recorded in the course of the telemedicine visit and may receive copies of available information for a reasonable fee.  I understand that some of the potential risks of receiving the Services via telemedicine include:  Delay or interruption in medical evaluation due to technological equipment failure or disruption; Information transmitted may not be sufficient (e.g. poor resolution of images) to allow for appropriate medical decision making by the Practitioner; and/or   In rare instances, security protocols could fail, causing a breach of personal health information.  Furthermore, I acknowledge that it is my responsibility to provide information about my medical history, conditions and care that is complete and accurate to the best of my ability. I acknowledge that Practitioner's advice, recommendations, and/or decision may be based on factors not within their control, such as incomplete or inaccurate data provided by me or distortions of diagnostic images or specimens that may result from electronic transmissions. I understand that the practice of medicine is not an exact science and that Practitioner makes no warranties or guarantees regarding treatment outcomes. I acknowledge that a copy of this consent can be made available to me via my patient portal ([place]), or I can request a printed copy by calling the office of [facility].    I understand that my insurance will be billed for this visit.   I have read or had this consent read to me. I understand the contents of this consent, which adequately explains the benefits and risks of the Services being provided via telemedicine.  I have been provided ample opportunity to ask questions regarding this consent and the Services and have had my questions answered to my satisfaction. I give my informed consent for the services to be provided through the use of telemedicine in my medical care

## 2021-07-03 ENCOUNTER — Ambulatory Visit (INDEPENDENT_AMBULATORY_CARE_PROVIDER_SITE_OTHER): Payer: Medicare PPO | Admitting: Family Medicine

## 2021-07-03 ENCOUNTER — Encounter: Payer: Self-pay | Admitting: Family Medicine

## 2021-07-03 ENCOUNTER — Other Ambulatory Visit: Payer: Self-pay

## 2021-07-03 VITALS — BP 124/72 | HR 75 | Temp 98.7°F | Ht 63.75 in | Wt 157.1 lb

## 2021-07-03 DIAGNOSIS — E785 Hyperlipidemia, unspecified: Secondary | ICD-10-CM

## 2021-07-03 DIAGNOSIS — E119 Type 2 diabetes mellitus without complications: Secondary | ICD-10-CM

## 2021-07-03 DIAGNOSIS — M85851 Other specified disorders of bone density and structure, right thigh: Secondary | ICD-10-CM

## 2021-07-03 DIAGNOSIS — Z0000 Encounter for general adult medical examination without abnormal findings: Secondary | ICD-10-CM | POA: Diagnosis not present

## 2021-07-03 DIAGNOSIS — K582 Mixed irritable bowel syndrome: Secondary | ICD-10-CM | POA: Diagnosis not present

## 2021-07-03 DIAGNOSIS — N390 Urinary tract infection, site not specified: Secondary | ICD-10-CM

## 2021-07-03 DIAGNOSIS — E538 Deficiency of other specified B group vitamins: Secondary | ICD-10-CM | POA: Diagnosis not present

## 2021-07-03 DIAGNOSIS — I2510 Atherosclerotic heart disease of native coronary artery without angina pectoris: Secondary | ICD-10-CM

## 2021-07-03 DIAGNOSIS — Z0001 Encounter for general adult medical examination with abnormal findings: Secondary | ICD-10-CM

## 2021-07-03 DIAGNOSIS — F331 Major depressive disorder, recurrent, moderate: Secondary | ICD-10-CM

## 2021-07-03 DIAGNOSIS — R413 Other amnesia: Secondary | ICD-10-CM

## 2021-07-03 DIAGNOSIS — I10 Essential (primary) hypertension: Secondary | ICD-10-CM | POA: Diagnosis not present

## 2021-07-03 DIAGNOSIS — K219 Gastro-esophageal reflux disease without esophagitis: Secondary | ICD-10-CM

## 2021-07-03 DIAGNOSIS — H9193 Unspecified hearing loss, bilateral: Secondary | ICD-10-CM

## 2021-07-03 DIAGNOSIS — I700 Atherosclerosis of aorta: Secondary | ICD-10-CM | POA: Diagnosis not present

## 2021-07-03 DIAGNOSIS — R339 Retention of urine, unspecified: Secondary | ICD-10-CM

## 2021-07-03 DIAGNOSIS — Z23 Encounter for immunization: Secondary | ICD-10-CM

## 2021-07-03 DIAGNOSIS — E611 Iron deficiency: Secondary | ICD-10-CM

## 2021-07-03 MED ORDER — VENLAFAXINE HCL ER 75 MG PO CP24: 75.0000 mg | ORAL_CAPSULE | Freq: Every day | ORAL | 3 refills | Status: DC

## 2021-07-03 MED ORDER — ROSUVASTATIN CALCIUM 20 MG PO TABS: 20.0000 mg | ORAL_TABLET | Freq: Every evening | ORAL | 3 refills | Status: DC

## 2021-07-03 MED ORDER — FAMOTIDINE 40 MG PO TABS: 40.0000 mg | ORAL_TABLET | Freq: Every day | ORAL | 3 refills | Status: DC

## 2021-07-03 NOTE — Progress Notes (Signed)
Patient ID: Shelby Vazquez, female    DOB: Apr 07, 1951, 70 y.o.   MRN: [id number]  This visit was conducted in person.  BP 124/72   Pulse 75   Temp 98.7 F (37.1 C) (Temporal)   Ht 5' 3.75" (1.619 m)   Wt 157 lb 2 oz (71.3 kg)   SpO2 99%   BMI 27.18 kg/m    CC: CPE Subjective:   HPI: Shelby Vazquez is a 70 y.o. female presenting on 07/03/2021 for Annual Exam (Prt 2. )   Saw health advisor 03/2021 for medicare wellness visit. Note reviewed.   No results found.  Flowsheet Row Clinical Support from 04/13/2021 in [place] at [facility] Total Score 0     New hearing aides this past year.  Fall Risk  04/13/2021 03/24/2020 02/24/2019 02/18/2018 02/17/2017  Falls in the past year? 0 0 1 No Yes  Comment - - fall with injury to buttocks; seen in ED - -  Number falls in past yr: 0 0 0 - 2 or more  Injury with Fall? 0 0 1 - Yes  Risk Factor Category  - - - - High Fall Risk  Risk for fall due to : Medication side effect Medication side effect - - -  Risk for fall due to: Comment - - - - -  Follow up Falls evaluation completed;Falls prevention discussed Falls evaluation completed;Falls prevention discussed - - Falls prevention discussed    Brother with h/o TBI and concussions unexpectedly passed away 01-27-21. She is executor of his affairs. He donated organs. She is considering grief counseling.   IBS - bowel urgency worse since brother's death. Continues IBGard PRN, miralax PRN. Lactose free diet. Librax has helped - takes BID scheduled, effexor has helped.   Recently saw cards Dr End - stable period.  OAB, urinary retention, recurrent UTIs followed by urology Amalia Hailey). On Hiprex (methenamine) daily for UTI ppx. Also using InterStim.   Feraheme infusion x2 06/2020.  GERD - on aciphex (through GI) and pepcid '40mg'$  qhs.   Concerned about memory difficulties. Wonders if grief contributes. H/o concussion 01/27/2017.   Preventative: COLONOSCOPY WITH PROPOFOL Date: 03/31/2014 sessile  serrated adenomas, sigmoid diverticulosis, rpt 3 yrs Cleotis Nipper, MD) COLONOSCOPY 03/2018 - diverticulosis, rpt 5 yrs ([name]) ESOPHAGOGASTRODUODENOSCOPY Date: 03/31/2014 mult gastric polyps, mild mucosal hemorrhages; Cleotis Nipper, MD Well woman with Dr. 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  Sees yearly in fall - normal paps  Mammogram 09/2020 [facility])  DEXA 03/2019 osteopenia ([place])  Lung cancer screening - not eligible  Flu shot yearly  [place] 10/2019, 11/2019, booster 06/2020  Pneumovax-23 01/2013, prevnar-13 01/2016  Td 01/27/10, Tdap 01-28-15  Zostavax 04/2013 shingrix discussed - 09/2020, 11/2020 Advanced directives: does not have this set up. Packet previously provided - encouraged to continue working on this. HCPOA would be daughter [name].  Seat belt use discussed  Sunscreen use discussed, no changing moles on skin  Non smoker Alcohol - none  Dentist - sees regularly Eye exam - yearly Bowel - no constipation, chronic IBS Bladder - no incontinence   Married 01/28/1972. 2 daughters '74, '80; 5 grandchildren.   No h/o physical or sexual abuse. 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   Edu: [employer]  Activity: really enjoys hiking, now training for 5k - wants to start silver sneakers Diet: good water, fruits/vegetables daily  Relevant past medical, surgical, family and social history reviewed and updated as indicated. Interim medical history since our last visit reviewed. Allergies and medications reviewed and updated. Outpatient Medications Prior to Visit  Medication Sig Dispense Refill   aspirin 81 MG tablet Take 1 tablet (81 mg total) by mouth daily. 30 tablet    B-D 3CC LUER-LOK SYR 25GX1" 25G X 1" 3 ML MISC USE AS DIRECTED WITH  CYANOCOBALAMIN EVERY 30  DAYS 12 each 3   Calcium Carbonate-Vitamin D 600-200 MG-UNIT CAPS Take 1 capsule by mouth daily.      clidinium-chlordiazePOXIDE (LIBRAX) 5-2.5 MG capsule Take 1 capsule by mouth in the morning and at bedtime. 60 capsule 5   cyanocobalamin (,VITAMIN B-12,) 1000 MCG/ML injection INJECT INTRAMUSCULARLY 1ML  EVERY 30 DAYS (DISCARD 28  DAYS AFTER FIRST USE) 3 mL 3   ibuprofen (ADVIL,MOTRIN) 200 MG tablet Take 600 mg by mouth every 6 (six) hours as needed for moderate pain.     loratadine (CLARITIN) 10 MG tablet Take 10 mg by mouth daily.     methenamine (HIPREX) 1 g tablet Take 1 g by mouth 2 (two) times daily with a meal.   1   metoprolol tartrate (LOPRESSOR) 50 MG tablet TAKE 1 TABLET BY MOUTH TWICE DAILY. MAY TAKE AN EXTRA 1/2 TO 1 TABLET FOR PALPITATIONS AND/OR BLOOD PRESSURE 270 tablet 3   ondansetron (ZOFRAN-ODT) 4 MG disintegrating tablet DISSOLVE 1 TABLET(4 MG) ON THE TONGUE EVERY 8 HOURS AS NEEDED FOR NAUSEA OR VOMITING 20 tablet 0   Peppermint Oil (IBGARD PO)      polyethylene glycol powder (GLYCOLAX/MIRALAX) 17 GM/SCOOP powder Take 1 Container by mouth daily. As needed      PRESCRIPTION MEDICATION Apply topically 2 (two) times daily. Estradiol Progesterone cream 0.05-30 mg/ml     Probiotic Product (PROBIOTIC PO) Take 2 capsules by mouth 2 (two) times daily.      RABEprazole (ACIPHEX) 20 MG tablet Take 20 mg by mouth daily.     sucralfate (CARAFATE) 1 GM/10ML suspension take 10 milliliters by mouth four times a day if needed 420 mL 0   Vitamin B Complex-C CAPS      famotidine (PEPCID) 40 MG tablet TAKE 2 TABLETS(80 MG) BY MOUTH AT BEDTIME 180 tablet 0   rosuvastatin (CRESTOR) 20 MG tablet TAKE 1 TABLET(20 MG) BY MOUTH DAILY 90 tablet 0   venlafaxine XR (EFFEXOR-XR) 75 MG 24 hr capsule TAKE 1 CAPSULE(75 MG) BY MOUTH DAILY WITH BREAKFAST 90 capsule 0   No facility-administered medications prior to visit.     Per HPI unless specifically indicated in ROS section below Review of Systems  Constitutional:  Negative for activity change, appetite change, chills, fatigue, fever and unexpected  weight change.  HENT:  Negative for hearing loss.   Eyes:  Negative for visual disturbance.  Respiratory:  Negative for cough, chest tightness, shortness of breath and wheezing.   Cardiovascular:  Negative for chest pain, palpitations and leg swelling.  Gastrointestinal:  Positive for diarrhea. Negative for abdominal distention, abdominal pain, blood in stool, constipation, nausea and vomiting.  Genitourinary:  Negative for difficulty urinating and hematuria.  Musculoskeletal:  Negative for arthralgias, myalgias and neck pain.  Skin:  Negative for rash.  Neurological:  Negative for dizziness, seizures, syncope and headaches.  Hematological:  Negative for adenopathy. Bruises/bleeds easily.  Psychiatric/Behavioral:  Negative for dysphoric mood. The patient is not nervous/anxious.    Objective:  BP 124/72   Pulse 75   Temp 98.7 F (37.1  C) (Temporal)   Ht 5' 3.75" (1.619 m)   Wt 157 lb 2 oz (71.3 kg)   SpO2 99%   BMI 27.18 kg/m   Wt Readings from Last 3 Encounters:  07/03/21 157 lb 2 oz (71.3 kg)  06/28/21 151 lb (68.5 kg)  07/28/20 156 lb 8 oz (71 kg)      Physical Exam Vitals and nursing note reviewed.  Constitutional:      Appearance: Normal appearance. She is not ill-appearing.  HENT:     Head: Normocephalic and atraumatic.     Right Ear: Tympanic membrane, ear canal and external ear normal. There is no impacted cerumen.     Left Ear: Tympanic membrane, ear canal and external ear normal. There is no impacted cerumen.  Eyes:     General:        Right eye: No discharge.        Left eye: No discharge.     Extraocular Movements: Extraocular movements intact.     Conjunctiva/sclera: Conjunctivae normal.     Pupils: Pupils are equal, round, and reactive to light.  Neck:     Thyroid: No thyroid mass or thyromegaly.     Vascular: No carotid bruit.  Cardiovascular:     Rate and Rhythm: Normal rate and regular rhythm.     Pulses: Normal pulses.     Heart sounds: Normal heart  sounds. No murmur heard. Pulmonary:     Effort: Pulmonary effort is normal. No respiratory distress.     Breath sounds: Normal breath sounds. No wheezing, rhonchi or rales.  Abdominal:     General: Bowel sounds are normal. There is no distension.     Palpations: Abdomen is soft. There is no mass.     Tenderness: There is no abdominal tenderness. There is no guarding or rebound.     Hernia: No hernia is present.  Musculoskeletal:     Cervical back: Normal range of motion and neck supple. No rigidity.     Right lower leg: No edema.     Left lower leg: No edema.  Lymphadenopathy:     Cervical: No cervical adenopathy.  Skin:    General: Skin is warm and dry.     Findings: No rash.  Neurological:     General: No focal deficit present.     Mental Status: She is alert. Mental status is at baseline.  Psychiatric:        Mood and Affect: Mood normal.        Behavior: Behavior normal.      Results for orders placed or performed in visit on 04/17/21  Microalbumin / creatinine urine ratio  Result Value Ref Range   Microalb, Ur 1.8 0.0 - 1.9 mg/dL   Creatinine,U 106.7 mg/dL   Microalb Creat Ratio 1.7 0.0 - 30.0 mg/g  CBC with Differential/Platelet  Result Value Ref Range   WBC 10.9 (H) 4.0 - 10.5 K/uL   RBC 4.40 3.87 - 5.11 Mil/uL   Hemoglobin 13.4 12.0 - 15.0 g/dL   HCT 39.5 36.0 - 46.0 %   MCV 89.8 78.0 - 100.0 fl   MCHC 34.1 30.0 - 36.0 g/dL   RDW 13.4 11.5 - 15.5 %   Platelets 390.0 150.0 - 400.0 K/uL   Neutrophils Relative % 54.7 43.0 - 77.0 %   Lymphocytes Relative 34.2 12.0 - 46.0 %   Monocytes Relative 9.8 3.0 - 12.0 %   Eosinophils Relative 0.7 0.0 - 5.0 %   Basophils Relative 0.6  0.0 - 3.0 %   Neutro Abs 6.0 1.4 - 7.7 K/uL   Lymphs Abs 3.7 0.7 - 4.0 K/uL   Monocytes Absolute 1.1 (H) 0.1 - 1.0 K/uL   Eosinophils Absolute 0.1 0.0 - 0.7 K/uL   Basophils Absolute 0.1 0.0 - 0.1 K/uL  Hemoglobin A1c  Result Value Ref Range   Hgb A1c MFr Bld 6.6 (H) 4.6 - 6.5 %   Comprehensive metabolic panel  Result Value Ref Range   Sodium 136 135 - 145 mEq/L   Potassium 3.8 3.5 - 5.1 mEq/L   Chloride 100 96 - 112 mEq/L   CO2 28 19 - 32 mEq/L   Glucose, Bld 85 70 - 99 mg/dL   BUN 16 6 - 23 mg/dL   Creatinine, Ser 0.85 0.40 - 1.20 mg/dL   Total Bilirubin 0.7 0.2 - 1.2 mg/dL   Alkaline Phosphatase 53 39 - 117 U/L   AST 25 0 - 37 U/L   ALT 23 0 - 35 U/L   Total Protein 6.8 6.0 - 8.3 g/dL   Albumin 4.7 3.5 - 5.2 g/dL   GFR 69.48 >60.00 mL/min   Calcium 10.0 8.4 - 10.5 mg/dL  Lipid panel  Result Value Ref Range   Cholesterol 137 0 - 200 mg/dL   Triglycerides 146.0 0.0 - 149.0 mg/dL   HDL 43.40 >39.00 mg/dL   VLDL 29.2 0.0 - 40.0 mg/dL   LDL Cholesterol 64 0 - 99 mg/dL   Total CHOL/HDL Ratio 3    NonHDL 93.26   Ferritin  Result Value Ref Range   Ferritin 160.9 10.0 - 291.0 ng/mL  IBC panel  Result Value Ref Range   Iron 126 42 - 145 ug/dL   Transferrin 256.0 212.0 - 360.0 mg/dL   Saturation Ratios 35.2 20.0 - 50.0 %  Vitamin B12  Result Value Ref Range   Vitamin B-12 >1550 (H) 211 - 911 pg/mL    Assessment & Plan:  This visit occurred during the SARS-CoV-2 public health emergency.  Safety protocols were in place, including screening questions prior to the visit, additional usage of staff PPE, and extensive cleaning of exam room while observing appropriate contact time as indicated for disinfecting solutions.   Problem List Items Addressed This Visit     Hyperlipidemia with target LDL less than 100 (Chronic)    Chronic, stable on crestor - continue. The 10-year ASCVD risk score (Arnett DK, et al., 2019) is: 20.4%   Values used to calculate the score:     Age: 19 years     Sex: Female     Is Non-Hispanic African American: No     Diabetic: Yes     Tobacco smoker: No     Systolic Blood Pressure: [id number] mmHg     Is BP treated: Yes     HDL Cholesterol: 43.4 mg/dL     Total Cholesterol: 137 mg/dL       Relevant Medications   rosuvastatin  (CRESTOR) 20 MG tablet   Essential hypertension (Chronic)    Chronic, stable on current regimen of metoprolol.       Relevant Medications   rosuvastatin (CRESTOR) 20 MG tablet   Abdominal aortic atherosclerosis (HCC) (Chronic)    Continue statin, aspirin.       Relevant Medications   rosuvastatin (CRESTOR) 20 MG tablet   Coronary artery disease, non-occlusive - Cor CA Score 53.  MIldCAD only. (Chronic)    Continue aspirin, statin.      Relevant Medications   rosuvastatin (  CRESTOR) 20 MG tablet   Vitamin B12 deficiency    B12 home injections on hold after latest B12 shot returned high.       GERD, SEVERE    Continues aciphex through GI, we prescribe pepcid '40mg'$  nightly.       Relevant Medications   famotidine (PEPCID) 40 MG tablet   Irritable bowel syndrome (IBS)    Chronic, longstanding followed by GI on daily scheduled librax bid, as well as PRN IBgard, zofran, and miralax. Discussed habit forming nature of librax, advised touch base with GI about recommendations on its use.  Pending establishing with new GI as [name] is retiring.       Relevant Medications   famotidine (PEPCID) 40 MG tablet   Osteopenia    Osteopenia persist. Followed by GYN.       MDD (major depressive disorder), recurrent episode, moderate ([place])    Chronic deteriorated despite effexor. Anticipate grieving brother's passing contributes to deterioration. Discussed increasing effexor dose - she prefers to continue same dose as is planning to establish with counselor.       Relevant Medications   venlafaxine XR (EFFEXOR-XR) 75 MG 24 hr capsule   Encounter for general adult medical examination with abnormal findings - Primary    Preventative protocols reviewed and updated unless pt declined. Discussed healthy diet and lifestyle.       Iron deficiency    Iron levels normal Feraheme infusion x2 completed 06/2020      Urinary retention with incomplete bladder emptying    Sees Dr Effie Berkshire - using  InterStim       Recurrent UTI    Regularly sees urology on daily methenamine (Hiprex)      Diet-controlled diabetes mellitus ([place])    Chronic, stable. Encouraged continue to follow diabetic diet.       Relevant Medications   rosuvastatin (CRESTOR) 20 MG tablet   Hearing loss    Continues using hearing aides.       Memory difficulties    Recently note by patient - contribution of mood/grief.  Reviewed healthy lifestyle changes to support a healthy brain.  If ongoing trouble, she will return for formal memory assessment.  Will also need to recommend at least 7 hours of sleep/night.       Other Visit Diagnoses     Need for influenza vaccination       Relevant Orders   Flu Vaccine QUAD High Dose(Fluad) (Completed)        Meds ordered this encounter  Medications   famotidine (PEPCID) 40 MG tablet    Sig: Take 1 tablet (40 mg total) by mouth at bedtime.    Dispense:  90 tablet    Refill:  3   rosuvastatin (CRESTOR) 20 MG tablet    Sig: Take 1 tablet (20 mg total) by mouth every evening.    Dispense:  90 tablet    Refill:  3   venlafaxine XR (EFFEXOR-XR) 75 MG 24 hr capsule    Sig: Take 1 capsule (75 mg total) by mouth daily with breakfast.    Dispense:  90 capsule    Refill:  3    Orders Placed This Encounter  Procedures   Flu Vaccine QUAD High Dose(Fluad)     Patient instructions: Flu shot today  Medicines refilled today.   Reviewed 4 core lifestyle modifications to support a healthy mind:  1. Nutritious well balance diet.  2. Regular physical activity routine.  3. Regular mental activity such as reading  books, word puzzles, math puzzles, jigsaw puzzles.  4. Social engagement.  Also ensure good blood pressure control, limit alcohol, no smoking.   If ongoing trouble with memory, return for formal memory assessment.  Return as needed or in 1 year for next physical.   Follow up plan: Return in about 1 year (around 07/03/2022) for annual exam, prior fasting  for blood work, medicare wellness visit.  Ria Bush, MD

## 2021-07-03 NOTE — Patient Instructions (Addendum)
Flu shot today  Medicines refilled today.   Reviewed 4 core lifestyle modifications to support a healthy mind:  1. Nutritious well balance diet.  2. Regular physical activity routine.  3. Regular mental activity such as reading books, word puzzles, math puzzles, jigsaw puzzles.  4. Social engagement.  Also ensure good blood pressure control, limit alcohol, no smoking.   If ongoing trouble with memory, return for formal memory assessment.  Return as needed or in 1 year for next physical.   Health Maintenance After Age 19 After age 13, you are at a higher risk for certain long-term diseases and infections as well as injuries from falls. Falls are a major cause of broken bones and head injuries in people who are older than age 73. Getting regular preventive care can help to keep you healthy and well. Preventive care includes getting regular testing and making lifestyle changes as recommended by your health care provider. Talk with your health care provider about: Which screenings and tests you should have. A screening is a test that checks for a disease when you have no symptoms. A diet and exercise plan that is right for you. What should I know about screenings and tests to prevent falls? Screening and testing are the best ways to find a health problem early. Early diagnosis and treatment give you the best chance of managing medical conditions that are common after age 32. Certain conditions and lifestyle choices may make you more likely to have a fall. Your health care provider may recommend: Regular vision checks. Poor vision and conditions such as cataracts can make you more likely to have a fall. If you wear glasses, make sure to get your prescription updated if your vision changes. Medicine review. Work with your health care provider to regularly review all of the medicines you are taking, including over-the-counter medicines. Ask your health care provider about any side effects that may make  you more likely to have a fall. Tell your health care provider if any medicines that you take make you feel dizzy or sleepy. Osteoporosis screening. Osteoporosis is a condition that causes the bones to get weaker. This can make the bones weak and cause them to break more easily. Blood pressure screening. Blood pressure changes and medicines to control blood pressure can make you feel dizzy. Strength and balance checks. Your health care provider may recommend certain tests to check your strength and balance while standing, walking, or changing positions. Foot health exam. Foot pain and numbness, as well as not wearing proper footwear, can make you more likely to have a fall. Depression screening. You may be more likely to have a fall if you have a fear of falling, feel emotionally low, or feel unable to do activities that you used to do. Alcohol use screening. Using too much alcohol can affect your balance and may make you more likely to have a fall. What actions can I take to lower my risk of falls? General instructions Talk with your health care provider about your risks for falling. Tell your health care provider if: You fall. Be sure to tell your health care provider about all falls, even ones that seem minor. You feel dizzy, sleepy, or off-balance. Take over-the-counter and prescription medicines only as told by your health care provider. These include any supplements. Eat a healthy diet and maintain a healthy weight. A healthy diet includes low-fat dairy products, low-fat (lean) meats, and fiber from whole grains, beans, and lots of fruits and vegetables. Home  safety Remove any tripping hazards, such as rugs, cords, and clutter. Install safety equipment such as grab bars in bathrooms and safety rails on stairs. Keep rooms and walkways well-lit. Activity  Follow a regular exercise program to stay fit. This will help you maintain your balance. Ask your health care provider what types of  exercise are appropriate for you. If you need a cane or walker, use it as recommended by your health care provider. Wear supportive shoes that have nonskid soles. Lifestyle Do not drink alcohol if your health care provider tells you not to drink. If you drink alcohol, limit how much you have: 0-1 drink a day for women. 0-2 drinks a day for men. Be aware of how much alcohol is in your drink. In the [place] one drink equals one typical bottle of beer (12 oz), one-half glass of wine (5 oz), or one shot of hard liquor (1 oz). Do not use any products that contain nicotine or tobacco, such as cigarettes and e-cigarettes. If you need help quitting, ask your health care provider. Summary Having a healthy lifestyle and getting preventive care can help to protect your health and wellness after age 56. Screening and testing are the best way to find a health problem early and help you avoid having a fall. Early diagnosis and treatment give you the best chance for managing medical conditions that are more common for people who are older than age 26. Falls are a major cause of broken bones and head injuries in people who are older than age 56. Take precautions to prevent a fall at home. Work with your health care provider to learn what changes you can make to improve your health and wellness and to prevent falls. This information is not intended to replace advice given to you by your health care provider. Make sure you discuss any questions you have with your health care provider. Document Revised: 12/15/2020 Document Reviewed: 09/22/2020 Elsevier Patient Education  2022 [employer].

## 2021-07-04 DIAGNOSIS — Z0001 Encounter for general adult medical examination with abnormal findings: Secondary | ICD-10-CM | POA: Insufficient documentation

## 2021-07-04 DIAGNOSIS — R413 Other amnesia: Secondary | ICD-10-CM | POA: Insufficient documentation

## 2021-07-04 DIAGNOSIS — Z0000 Encounter for general adult medical examination without abnormal findings: Secondary | ICD-10-CM | POA: Insufficient documentation

## 2021-07-04 NOTE — Assessment & Plan Note (Signed)
Chronic, longstanding followed by GI on daily scheduled librax bid, as well as PRN IBgard, zofran, and miralax. Discussed habit forming nature of librax, advised touch base with GI about recommendations on its use.  Pending establishing with new GI as [name] is retiring.

## 2021-07-04 NOTE — Assessment & Plan Note (Signed)
Regularly sees urology on daily methenamine (Hiprex)

## 2021-07-04 NOTE — Assessment & Plan Note (Addendum)
Recently note by patient - contribution of mood/grief.  Reviewed healthy lifestyle changes to support a healthy brain.  If ongoing trouble, she will return for formal memory assessment.  Will also need to recommend at least 7 hours of sleep/night.

## 2021-07-04 NOTE — Assessment & Plan Note (Signed)
Continue statin, aspirin 

## 2021-07-04 NOTE — Assessment & Plan Note (Signed)
Osteopenia persist. Followed by GYN.

## 2021-07-04 NOTE — Assessment & Plan Note (Signed)
B12 home injections on hold after latest B12 shot returned high.

## 2021-07-04 NOTE — Assessment & Plan Note (Deleted)
x

## 2021-07-04 NOTE — Assessment & Plan Note (Signed)
Chronic, stable on current regimen of metoprolol.

## 2021-07-04 NOTE — Assessment & Plan Note (Signed)
Chronic, stable on crestor - continue. The 10-year ASCVD risk score (Arnett DK, et al., 2019) is: 20.4%   Values used to calculate the score:     Age: 70 years     Sex: Female     Is Non-Hispanic African American: No     Diabetic: Yes     Tobacco smoker: No     Systolic Blood Pressure: [id number] mmHg     Is BP treated: Yes     HDL Cholesterol: 43.4 mg/dL     Total Cholesterol: 137 mg/dL

## 2021-07-04 NOTE — Assessment & Plan Note (Signed)
Continues aciphex through GI, we prescribe pepcid '40mg'$  nightly.

## 2021-07-04 NOTE — Assessment & Plan Note (Signed)
Continue aspirin, statin.  

## 2021-07-04 NOTE — Assessment & Plan Note (Signed)
Preventative protocols reviewed and updated unless pt declined. Discussed healthy diet and lifestyle.  

## 2021-07-04 NOTE — Assessment & Plan Note (Signed)
Iron levels normal Feraheme infusion x2 completed 06/2020

## 2021-07-04 NOTE — Assessment & Plan Note (Signed)
Chronic, stable. Encouraged continue to follow diabetic diet.

## 2021-07-04 NOTE — Assessment & Plan Note (Signed)
Continues using hearing aides.

## 2021-07-04 NOTE — Assessment & Plan Note (Signed)
Sees Dr Effie Berkshire - using [facility]

## 2021-07-04 NOTE — Assessment & Plan Note (Signed)
Chronic deteriorated despite effexor. Anticipate grieving brother's passing contributes to deterioration. Discussed increasing effexor dose - she prefers to continue same dose as is planning to establish with counselor.

## 2021-07-30 ENCOUNTER — Other Ambulatory Visit: Payer: Self-pay | Admitting: Family Medicine

## 2021-08-07 DIAGNOSIS — R339 Retention of urine, unspecified: Secondary | ICD-10-CM | POA: Diagnosis not present

## 2021-08-20 ENCOUNTER — Other Ambulatory Visit: Payer: Self-pay | Admitting: Family Medicine

## 2021-08-29 DIAGNOSIS — F329 Major depressive disorder, single episode, unspecified: Secondary | ICD-10-CM | POA: Diagnosis not present

## 2021-08-29 DIAGNOSIS — K589 Irritable bowel syndrome without diarrhea: Secondary | ICD-10-CM | POA: Diagnosis not present

## 2021-09-04 ENCOUNTER — Ambulatory Visit: Payer: Medicare PPO | Admitting: Family Medicine

## 2021-09-04 ENCOUNTER — Encounter: Payer: Self-pay | Admitting: Family Medicine

## 2021-09-04 ENCOUNTER — Other Ambulatory Visit: Payer: Self-pay

## 2021-09-04 VITALS — BP 140/86 | HR 72 | Temp 98.0°F | Ht 63.75 in | Wt 158.6 lb

## 2021-09-04 DIAGNOSIS — F331 Major depressive disorder, recurrent, moderate: Secondary | ICD-10-CM | POA: Diagnosis not present

## 2021-09-04 DIAGNOSIS — K582 Mixed irritable bowel syndrome: Secondary | ICD-10-CM

## 2021-09-04 MED ORDER — VENLAFAXINE HCL ER 37.5 MG PO CP24: 37.5000 mg | ORAL_CAPSULE | ORAL | 2 refills | Status: DC

## 2021-09-04 MED ORDER — ONDANSETRON 4 MG PO TBDP: ORAL_TABLET | ORAL | 0 refills | Status: DC

## 2021-09-04 NOTE — Assessment & Plan Note (Signed)
Chronic. Desires to come off effexor. Had trouble tapering on her own. Discussed prolonged slow taper as per instructions. Update if difficulty with this to transition to cymbalta for easier taper. Pt agrees with plan.  Discussed counseling option Discussed need to monitor for recurrent/worsening depression coming off anti depressants.

## 2021-09-04 NOTE — Patient Instructions (Addendum)
Let's do slow taper of effexor - take 75mg  alternating with 37.5mg  every other day for 2 weeks then drop to 37.5mg  daily for 2 weeks then if doing well may try taking 37.5mg  every other day for 2 weeks then stop. If trouble coming off with this slow taper, let us know and we may transition you to cymbalta for easier taper  Let us know if mood worsening off effexor.

## 2021-09-04 NOTE — Progress Notes (Signed)
Patient ID: Shelby Vazquez, female    DOB: [birth year], 70 y.o.   MRN: [id number]  This visit was conducted in person.  BP 140/86   Pulse 72   Temp 98 F (36.7 C) (Temporal)   Ht 5' 3.75" (1.619 m)   Wt 158 lb 9 oz (71.9 kg)   SpO2 98%   BMI 27.43 kg/m    CC: discuss effexor Subjective:   HPI: Shelby Vazquez is a 70 y.o. female presenting on 09/04/2021 for Discuss Medication (Wants to discuss Effexor. )   MDD on effexor 75mg  daily. Recent deterioration after brother's passing. Last visit discussed counseling - she has not done this yet. Aunt also recently passed away. She decided to try coming off effexor - significant struggle and had what sounds like discontinuation symptoms. She has decided to stop effexor - doesn't want to be on any medications that could make her feel this way. She has also committed to regular use of local [employer].   IBS - on librax BID as well as PRN IBgard, zofran and miralax - over the past 3 weeks she decided to only take librax in the omrnings. Notes less groggy in the mornings with this change. Has established with PA Sibley Cellar at Dr [name]'s office. Started on dicyclomine 10mg  TID PRN through GI.      Relevant past medical, surgical, family and social history reviewed and updated as indicated. Interim medical history since our last visit reviewed. Allergies and medications reviewed and updated. Outpatient Medications Prior to Visit  Medication Sig Dispense Refill   aspirin 81 MG tablet Take 1 tablet (81 mg total) by mouth daily. 30 tablet    B-D 3CC LUER-LOK SYR 25GX1" 25G X 1" 3 ML MISC USE AS DIRECTED WITH  CYANOCOBALAMIN EVERY 30  DAYS 12 each 3   Calcium Carbonate-Vitamin D 600-200 MG-UNIT CAPS Take 1 capsule by mouth daily.     clidinium-chlordiazePOXIDE (LIBRAX) 5-2.5 MG capsule Take 1 capsule by mouth in the morning and at bedtime. 60 capsule 5   famotidine (PEPCID) 40 MG tablet Take 1 tablet (40 mg total) by mouth at bedtime. 90 tablet 3    ibuprofen (ADVIL,MOTRIN) 200 MG tablet Take 600 mg by mouth every 6 (six) hours as needed for moderate pain.     loratadine (CLARITIN) 10 MG tablet Take 10 mg by mouth daily.     methenamine (HIPREX) 1 g tablet Take 1 g by mouth 2 (two) times daily with a meal.   1   metoprolol tartrate (LOPRESSOR) 50 MG tablet TAKE 1 TABLET BY MOUTH TWICE DAILY. MAY TAKE AN EXTRA 1/2 TO 1 TABLET FOR PALPITATIONS AND/OR BLOOD PRESSURE 270 tablet 3   Peppermint Oil (IBGARD PO)      polyethylene glycol powder (GLYCOLAX/MIRALAX) 17 GM/SCOOP powder Take 1 Container by mouth daily. As needed      PRESCRIPTION MEDICATION Apply topically 2 (two) times daily. Estradiol Progesterone cream 0.05-30 mg/ml     Probiotic Product (PROBIOTIC PO) Take 2 capsules by mouth 2 (two) times daily.      RABEprazole (ACIPHEX) 20 MG tablet Take 20 mg by mouth daily.     rosuvastatin (CRESTOR) 20 MG tablet Take 1 tablet (20 mg total) by mouth every evening. 90 tablet 3   sucralfate (CARAFATE) 1 GM/10ML suspension take 10 milliliters by mouth four times a day if needed 420 mL 0   Vitamin B Complex-C CAPS      ondansetron (ZOFRAN-ODT) 4 MG disintegrating tablet  DISSOLVE 1 TABLET(4 MG) ON THE TONGUE EVERY 8 HOURS AS NEEDED FOR NAUSEA OR VOMITING 20 tablet 0   venlafaxine XR (EFFEXOR-XR) 75 MG 24 hr capsule Take 1 capsule (75 mg total) by mouth daily with breakfast. 90 capsule 3   cyanocobalamin (,VITAMIN B-12,) 1000 MCG/ML injection INJECT INTRAMUSCULARLY 1ML  EVERY 30 DAYS (DISCARD 28  DAYS AFTER FIRST USE) (Patient not taking: Reported on 09/04/2021) 3 mL 3   No facility-administered medications prior to visit.     Per HPI unless specifically indicated in ROS section below Review of Systems  Objective:  BP 140/86   Pulse 72   Temp 98 F (36.7 C) (Temporal)   Ht 5' 3.75" (1.619 m)   Wt 158 lb 9 oz (71.9 kg)   SpO2 98%   BMI 27.43 kg/m   Wt Readings from Last 3 Encounters:  09/04/21 158 lb 9 oz (71.9 kg)  07/03/21 157 lb 2 oz  (71.3 kg)  06/28/21 151 lb (68.5 kg)      Physical Exam Vitals and nursing note reviewed.  Constitutional:      Appearance: Normal appearance. She is not ill-appearing.  Cardiovascular:     Rate and Rhythm: Normal rate and regular rhythm.     Pulses: Normal pulses.     Heart sounds: Normal heart sounds. No murmur heard. Pulmonary:     Effort: Pulmonary effort is normal. No respiratory distress.     Breath sounds: Normal breath sounds. No wheezing, rhonchi or rales.  Musculoskeletal:     Right lower leg: No edema.     Left lower leg: No edema.  Skin:    General: Skin is warm and dry.     Findings: No rash.  Neurological:     Mental Status: She is alert.  Psychiatric:        Mood and Affect: Mood normal.        Behavior: Behavior normal.       Assessment & Plan:  This visit occurred during the SARS-CoV-2 public health emergency.  Safety protocols were in place, including screening questions prior to the visit, additional usage of staff PPE, and extensive cleaning of exam room while observing appropriate contact time as indicated for disinfecting solutions.   Problem List Items Addressed This Visit     Irritable bowel syndrome (IBS)    Discussed recent GI visit.  Planning to try tapering librax use.  Has been started dicyclomine by GI PA.  Desires to come off effexor - see below.       Relevant Medications   ondansetron (ZOFRAN-ODT) 4 MG disintegrating tablet   MDD (major depressive disorder), recurrent episode, moderate (HCC) - Primary    Chronic. Desires to come off effexor. Had trouble tapering on her own. Discussed prolonged slow taper as per instructions. Update if difficulty with this to transition to cymbalta for easier taper. Pt agrees with plan.  Discussed counseling option Discussed need to monitor for recurrent/worsening depression coming off anti depressants.       Relevant Medications   venlafaxine XR (EFFEXOR XR) 37.5 MG 24 hr capsule     Meds ordered  this encounter  Medications   ondansetron (ZOFRAN-ODT) 4 MG disintegrating tablet    Sig: DISSOLVE 1 TABLET(4 MG) ON THE TONGUE EVERY 8 HOURS AS NEEDED FOR NAUSEA OR VOMITING    Dispense:  20 tablet    Refill:  0   venlafaxine XR (EFFEXOR XR) 37.5 MG 24 hr capsule    Sig: Take 1 capsule (  37.5 mg total) by mouth every other day. Alternating with 75mg  every other day for 2 weeks the drop to 37.5mg  daily    Dispense:  30 capsule    Refill:  2    No orders of the defined types were placed in this encounter.   Patient Instructions  Let's do slow taper of effexor - take 75mg  alternating with 37.5mg  every other day for 2 weeks then drop to 37.5mg  daily for 2 weeks then if doing well may try taking 37.5mg  every other day for 2 weeks then stop. If trouble coming off with this slow taper, let us know and we may transition you to cymbalta for easier taper  Let us know if mood worsening off effexor.   Follow up plan: No follow-ups on file.  Ria Bush, MD

## 2021-09-04 NOTE — Assessment & Plan Note (Signed)
Discussed recent GI visit.  Planning to try tapering librax use.  Has been started dicyclomine by GI PA.  Desires to come off effexor - see below.

## 2021-10-01 ENCOUNTER — Other Ambulatory Visit: Payer: Self-pay | Admitting: Cardiology

## 2021-10-01 DIAGNOSIS — Z01419 Encounter for gynecological examination (general) (routine) without abnormal findings: Secondary | ICD-10-CM | POA: Diagnosis not present

## 2021-10-01 DIAGNOSIS — Z6827 Body mass index (BMI) 27.0-27.9, adult: Secondary | ICD-10-CM | POA: Diagnosis not present

## 2021-10-01 DIAGNOSIS — Z1231 Encounter for screening mammogram for malignant neoplasm of breast: Secondary | ICD-10-CM | POA: Diagnosis not present

## 2021-10-01 LAB — HM MAMMOGRAPHY

## 2021-10-10 ENCOUNTER — Encounter: Payer: Self-pay | Admitting: Family Medicine

## 2021-10-25 DIAGNOSIS — R7303 Prediabetes: Secondary | ICD-10-CM | POA: Diagnosis not present

## 2021-10-25 DIAGNOSIS — H524 Presbyopia: Secondary | ICD-10-CM | POA: Diagnosis not present

## 2021-10-25 DIAGNOSIS — Z961 Presence of intraocular lens: Secondary | ICD-10-CM | POA: Diagnosis not present

## 2021-11-16 ENCOUNTER — Ambulatory Visit: Payer: Medicare PPO | Admitting: Family Medicine

## 2021-11-23 ENCOUNTER — Other Ambulatory Visit: Payer: Self-pay

## 2021-11-23 ENCOUNTER — Ambulatory Visit: Payer: Medicare PPO | Admitting: Family Medicine

## 2021-11-23 ENCOUNTER — Encounter: Payer: Self-pay | Admitting: Family Medicine

## 2021-11-23 VITALS — BP 150/84 | HR 71 | Temp 98.0°F | Ht 63.75 in | Wt 157.1 lb

## 2021-11-23 DIAGNOSIS — F331 Major depressive disorder, recurrent, moderate: Secondary | ICD-10-CM

## 2021-11-23 DIAGNOSIS — I10 Essential (primary) hypertension: Secondary | ICD-10-CM

## 2021-11-23 MED ORDER — VENLAFAXINE HCL ER 37.5 MG PO CP24: 37.5000 mg | ORAL_CAPSULE | Freq: Every day | ORAL | 1 refills | Status: DC

## 2021-11-23 NOTE — Assessment & Plan Note (Signed)
Chronic, deteriorated in setting of recent deaths in family and friends. Not a good time to try and taper off effexor. She ultimately does want to come off this medication, but desires to continue 37.5mg  daily dose at this time.  Support provided. Encouraged looking into involvement at [employer], consider return to counseling, to let us know if desires referral.  Declines transition to cymbalta at this time.

## 2021-11-23 NOTE — Progress Notes (Signed)
Patient ID: Shelby Vazquez, female    DOB: 09-18-1951, 71 y.o.   MRN: [id number]  This visit was conducted in person.  BP (!) 150/84 (BP Location: Right Arm, Cuff Size: Normal)    Pulse 71    Temp 98 F (36.7 C) (Temporal)    Ht 5' 3.75" (1.619 m)    Wt 157 lb 1 oz (71.2 kg)    SpO2 96%    BMI 27.17 kg/m   BP Readings from Last 3 Encounters:  11/23/21 (!) 150/84  09/04/21 140/86  07/03/21 124/72  BP remains elevated on retesting.   CC: MDD f/u visit  Subjective:   HPI: Shelby Vazquez is a 71 y.o. female presenting on 11/23/2021 for Depression (Here for f/u.)   See prior note for details. Seen here 08/2021 with deterioration of depression after brother's then aunt's passing. However she desired to come off effexor and had difficulty with discontinuation symptoms when tried on her own.   We started slow effexor 75/37.5mg  every other day taper for 2 wks then 37.5mg  for 2 weeks then QOD for 2 wks then stop.   She  has not been able to fully taper off effexor. She is currently on effexor 37.5mg  daily and feels stable on this.   Now neighbor she was close to recently passed away. SIL with chronic medical illness in h/o double lung transplant.   Difficult time recently due to all of the above. + anhedonia.  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  She has started forcing herself to go to her gym.   BP elevation noted recently - as well as at home to 140/80s.     Relevant past medical, surgical, family and social history reviewed and updated as indicated. Interim medical history since our last visit reviewed. Allergies and medications reviewed and updated. Outpatient Medications Prior to Visit  Medication Sig Dispense Refill   aspirin 81 MG tablet Take 1 tablet (81 mg total) by mouth daily. 30 tablet    Calcium Carbonate-Vitamin D 600-200 MG-UNIT CAPS Take 1 capsule by mouth daily.     famotidine (PEPCID) 40 MG tablet Take 1 tablet (40 mg total) by mouth at bedtime. 90  tablet 3   ibuprofen (ADVIL,MOTRIN) 200 MG tablet Take 600 mg by mouth every 6 (six) hours as needed for moderate pain.     loratadine (CLARITIN) 10 MG tablet Take 10 mg by mouth daily.     methenamine (HIPREX) 1 g tablet Take 1 g by mouth 2 (two) times daily with a meal.   1   metoprolol tartrate (LOPRESSOR) 50 MG tablet TAKE 1 TABLET BY MOUTH TWICE DAILY. MAY TAKE AN EXTRA HALF TO 1 TABLET FOR PALPITATIONS AND/OR BLOOD PRESSURE 270 tablet 3   ondansetron (ZOFRAN-ODT) 4 MG disintegrating tablet DISSOLVE 1 TABLET(4 MG) ON THE TONGUE EVERY 8 HOURS AS NEEDED FOR NAUSEA OR VOMITING 20 tablet 0   Peppermint Oil (IBGARD PO)      polyethylene glycol powder (GLYCOLAX/MIRALAX) 17 GM/SCOOP powder Take 1 Container by mouth daily. As needed      PRESCRIPTION MEDICATION Apply topically 2 (two) times daily. Estradiol Progesterone cream 0.05-30 mg/ml     Probiotic Product (PROBIOTIC PO) Take 2 capsules by mouth 2 (two) times daily.      RABEprazole (ACIPHEX) 20 MG tablet Take 20 mg by mouth daily.     rosuvastatin (CRESTOR) 20 MG tablet Take 1 tablet (20 mg total) by mouth every evening. 90 tablet 3  Vitamin B Complex-C CAPS      venlafaxine XR (EFFEXOR XR) 37.5 MG 24 hr capsule Take 1 capsule (37.5 mg total) by mouth every other day. Alternating with 75mg  every other day for 2 weeks the drop to 37.5mg  daily 30 capsule 2   B-D 3CC LUER-LOK SYR 25GX1" 25G X 1" 3 ML MISC USE AS DIRECTED WITH  CYANOCOBALAMIN EVERY 30  DAYS 12 each 3   clidinium-chlordiazePOXIDE (LIBRAX) 5-2.5 MG capsule Take 1 capsule by mouth in the morning and at bedtime. 60 capsule 5   cyanocobalamin (,VITAMIN B-12,) 1000 MCG/ML injection INJECT INTRAMUSCULARLY 1ML  EVERY 30 DAYS (DISCARD 28  DAYS AFTER FIRST USE) (Patient not taking: Reported on 09/04/2021) 3 mL 3   sucralfate (CARAFATE) 1 GM/10ML suspension take 10 milliliters by mouth four times a day if needed 420 mL 0   No facility-administered medications prior to visit.     Per HPI  unless specifically indicated in ROS section below Review of Systems  Objective:  BP (!) 150/84 (BP Location: Right Arm, Cuff Size: Normal)    Pulse 71    Temp 98 F (36.7 C) (Temporal)    Ht 5' 3.75" (1.619 m)    Wt 157 lb 1 oz (71.2 kg)    SpO2 96%    BMI 27.17 kg/m   Wt Readings from Last 3 Encounters:  11/23/21 157 lb 1 oz (71.2 kg)  09/04/21 158 lb 9 oz (71.9 kg)  07/03/21 157 lb 2 oz (71.3 kg)      Physical Exam Vitals and nursing note reviewed.  Constitutional:      Appearance: Normal appearance. She is not ill-appearing.  Neurological:     Mental Status: She is alert.  Psychiatric:        Attention and Perception: Attention normal.        Mood and Affect: Mood is depressed.        Speech: Speech normal.        Behavior: Behavior normal.      Results for orders placed or performed in visit on 10/10/21  HM MAMMOGRAPHY  Result Value Ref Range   HM Mammogram 0-4 Bi-Rad 0-4 Bi-Rad, Self Reported Normal   Depression screen [facility] 2/9 11/23/2021 04/13/2021 03/24/2020 02/24/2019 02/18/2018  Decreased Interest 1 0 0 0 0  Down, Depressed, Hopeless 1 0 0 0 0  PHQ - 2 Score 2 0 0 0 0  Altered sleeping 1 0 0 0 -  Tired, decreased energy 1 0 0 0 -  Change in appetite 1 0 0 0 -  Feeling bad or failure about yourself  2 0 0 0 -  Trouble concentrating 1 0 0 0 -  Moving slowly or fidgety/restless 1 0 0 0 -  Suicidal thoughts 1 0 0 0 -  PHQ-9 Score 10 0 0 0 -  Difficult doing work/chores - Not difficult at all Not difficult at all Not difficult at all -  Some recent data might be hidden    GAD 7 : Generalized Anxiety Score 11/23/2021  Nervous, Anxious, on Edge 2  Control/stop worrying 2  Worry too much - different things 2  Trouble relaxing 3  Restless 1  Easily annoyed or irritable 2  Afraid - awful might happen 2  Total GAD 7 Score 14   Assessment & Plan:  This visit occurred during the SARS-CoV-2 public health emergency.  Safety protocols were in place, including screening  questions prior to the visit, additional usage of staff PPE,  and extensive cleaning of exam room while observing appropriate contact time as indicated for disinfecting solutions.   Problem List Items Addressed This Visit     Essential hypertension    Chronic, deteriorated in setting of being upset today.  Home readings staying 140/70-80s. Advised continue daily BP checks, let us know if persistently >140/90.       MDD (major depressive disorder), recurrent episode, moderate (HCC) - Primary    Chronic, deteriorated in setting of recent deaths in family and friends. Not a good time to try and taper off effexor. She ultimately does want to come off this medication, but desires to continue 37.5mg  daily dose at this time.  Support provided. Encouraged looking into involvement at [employer], consider return to counseling, to let us know if desires referral.  Declines transition to cymbalta at this time.       Relevant Medications   venlafaxine XR (EFFEXOR XR) 37.5 MG 24 hr capsule     Meds ordered this encounter  Medications   venlafaxine XR (EFFEXOR XR) 37.5 MG 24 hr capsule    Sig: Take 1 capsule (37.5 mg total) by mouth daily with breakfast.    Dispense:  90 capsule    Refill:  1   No orders of the defined types were placed in this encounter.    Patient Instructions  Let's continue effexor 37.5mg  daily. Will continue this for next several months then reassess.  Continue checking blood pressures at home when in a resting state.  Let me know if staying >140/90 consistently at home.  Return as needed or in 3-4 months for follow up visit.   Follow up plan: Return in about 3 months (around 02/20/2022) for follow up visit.  Ria Bush, MD

## 2021-11-23 NOTE — Assessment & Plan Note (Signed)
Chronic, deteriorated in setting of being upset today.  Home readings staying 140/70-80s. Advised continue daily BP checks, let us know if persistently >140/90.

## 2021-11-23 NOTE — Patient Instructions (Addendum)
Let's continue effexor 37.5mg  daily. Will continue this for next several months then reassess.  Continue checking blood pressures at home when in a resting state.  Let me know if staying >140/90 consistently at home.  Return as needed or in 3-4 months for follow up visit.

## 2021-11-28 LAB — HM DIABETES EYE EXAM

## 2021-12-17 DIAGNOSIS — T8484XA Pain due to internal orthopedic prosthetic devices, implants and grafts, initial encounter: Secondary | ICD-10-CM | POA: Diagnosis not present

## 2021-12-17 DIAGNOSIS — M79671 Pain in right foot: Secondary | ICD-10-CM | POA: Diagnosis not present

## 2021-12-18 ENCOUNTER — Telehealth: Payer: Self-pay

## 2021-12-18 NOTE — Telephone Encounter (Signed)
° °  Pre-operative Risk Assessment    Patient Name: Shelby Vazquez  DOB: 1951-06-30 MRN: [id number]      Request for Surgical Clearance    Procedure:   Removal of deep implant right foot  Date of Surgery:  Clearance TBD                                 Surgeon:  Dr. Wylene Simmer Surgeon's Group or Practice Name:  [facility] Phone number:  [phone] Fax number:  [phone]    Type of Clearance Requested:   - Medical  - Pharmacy:  Hold Aspirin     Type of Anesthesia:   Choice    Additional requests/questions:    SignedJacqulynn Cadet   12/18/2021, 2:48 PM

## 2021-12-20 ENCOUNTER — Telehealth: Payer: Self-pay

## 2021-12-20 NOTE — Telephone Encounter (Signed)
Agree with this. Thank you.  

## 2021-12-20 NOTE — Telephone Encounter (Signed)
I s/w the pt about needing appt for pre op clearance. Pt agreeable to appt tomorrow with Laurann Montana, NP at [facility] location @ 10:30 12/21/21. Pt states she will change her PCP appt that she has for tomorrow. Pt tells me that she has been having a problem with her BP running high, along with some chest pressure. I asked pt series of question for assessment of chest pressure. Pt denies: sob, fever, chills, arm pain  left or right, she does confirm she has been having nausea. I advised the pt that if her chest discomfort worsens between now and her appt tomorrow she is to report to the ED. Pt is agreeable to plan of care and thanked me for the help and the call. I will update all parties involved in care of the pt.  ?

## 2021-12-20 NOTE — Telephone Encounter (Signed)
Patient is calling today needing to rescheduled her scheduled appointment with Dr. Danise Mina for 12/21/21 due to having issues with her blood pressure. Patient was able to get in with her cardiologist tomorrow at 10:30. I advise patient we would rescheduled her to next opening 01/16/22 with Dr. Danise Mina. Patient states "she mentally doesn't think she will be able to wait that long." I advised patient I would added her to the wait list if any cancellation come open we would contact her then!. Would you please follow up with patient. Please !

## 2021-12-20 NOTE — Telephone Encounter (Signed)
I spoke with pt; pt said her BP has been going up at times. Today pt is resting now and BP 142/89. Pt has appt with cardiology on 12/21/21 for pre op clearance and chest pressure and elevated BP.and that is why pt had to cancel appt with Dr Darnell Level. Pt was rescheduled for Dr Darnell Level 01/16/22. I spoke with pt and she said that 11/23/21  she and DR G discussed staying on effexor 37.5 mg and pt is doing that but pt said she is feeling depressed and wants to see Dr Darnell Level about doing an easy transition to a different antidepressant med.  No SI/HI.  LIsa CMA said could add pt on 12/24/21 at 1230. Pt scheduled that appt on 12/24/21 at 39 with Dr Darnell Level and cancelled the 01/16/22 appt with Dr Darnell Level. UC & ED precautions given for pressure in chest, hypertension, and depression. Pt voiced understanding and said if needed she would go to ED; pts husband is with her today. Sending note to Dr Darnell Level who is out of office, Dr Damita Dunnings who is in office and Lattie Haw CMA. ?

## 2021-12-21 ENCOUNTER — Ambulatory Visit: Payer: Medicare PPO | Admitting: Family Medicine

## 2021-12-21 ENCOUNTER — Ambulatory Visit (HOSPITAL_BASED_OUTPATIENT_CLINIC_OR_DEPARTMENT_OTHER): Payer: Medicare PPO | Admitting: Family

## 2021-12-21 ENCOUNTER — Encounter (HOSPITAL_BASED_OUTPATIENT_CLINIC_OR_DEPARTMENT_OTHER): Payer: Self-pay | Admitting: Family

## 2021-12-21 ENCOUNTER — Other Ambulatory Visit: Payer: Self-pay

## 2021-12-21 VITALS — BP 120/80 | HR 65 | Ht 63.75 in | Wt 157.0 lb

## 2021-12-21 DIAGNOSIS — E785 Hyperlipidemia, unspecified: Secondary | ICD-10-CM | POA: Diagnosis not present

## 2021-12-21 DIAGNOSIS — I25118 Atherosclerotic heart disease of native coronary artery with other forms of angina pectoris: Secondary | ICD-10-CM | POA: Diagnosis not present

## 2021-12-21 DIAGNOSIS — Z01810 Encounter for preprocedural cardiovascular examination: Secondary | ICD-10-CM | POA: Diagnosis not present

## 2021-12-21 DIAGNOSIS — I10 Essential (primary) hypertension: Secondary | ICD-10-CM | POA: Diagnosis not present

## 2021-12-21 MED ORDER — LOSARTAN POTASSIUM 25 MG PO TABS: 12.5000 mg | ORAL_TABLET | Freq: Every day | ORAL | 2 refills | Status: DC

## 2021-12-21 NOTE — Addendum Note (Signed)
Addended by: Gerald Stabs on: 12/21/2021 01:57 PM ? ? Modules accepted: Orders ? ?

## 2021-12-21 NOTE — Patient Instructions (Addendum)
Medication Instructions:  ?Your physician has recommended you make the following change in your medication:  ? ?START Losartan half tablet once daily (12.5mg ) ? ?We will have you check your blood pressure once per day over the weekend. If your blood pressure is still consistently more than 130/80 we will plan to increase to a whole tablet.   ? ?*If you need a refill on your cardiac medications before your next appointment, please call your pharmacy* ? ?Lab Work: ?None ordered today.  ? ?Testing/Procedures: ?Your EKG today shows normal sinus rhythm which is a good result! ? ?Follow-Up: ?At [facility], you and your health needs are our priority.  As part of our continuing mission to provide you with exceptional heart care, we have created designated Provider Care Teams.  These Care Teams include your primary Cardiologist (physician) and Advanced Practice Providers (APPs -  Physician Assistants and Nurse Practitioners) who all work together to provide you with the care you need, when you need it. ? ?We recommend signing up for the patient portal called "MyChart".  Sign up information is provided on this After Visit Summary.  MyChart is used to connect with patients for Virtual Visits (Telemedicine).  Patients are able to view lab/test results, encounter notes, upcoming appointments, etc.  Non-urgent messages can be sent to your provider as well.   ?To learn more about what you can do with MyChart, go to [email or web address].   ? ?Your next appointment:   ?3 month(s) ? ?The format for your next appointment:   ?In Person ? ?Provider:   ?Glenetta Hew, MD or Loel Dubonnet, NP   ? ? ?Other Instructions ? ?Loel Dubonnet, NP will send a note to Dr .Doran Durand that you are cleared for your surgical procedure. You may hold Aspirin as directed by their office.  ? ?Tips to Measure your Blood Pressure Correctly ? ?Our goal is for blood pressure to be less than 130/80.  ? ?Here's what you can do to ensure a correct  reading: ? Don't drink a caffeinated beverage or smoke during the 30 minutes before the test. ? Sit quietly for five minutes before the test begins. ? During the measurement, sit in a chair with your feet on the floor and your arm supported so your elbow is at about heart level. ? The inflatable part of the cuff should completely cover at least 80% of your upper arm, and the cuff should be placed on bare skin, not over a shirt. ? Don't talk during the measurement. ? ? ?Blood pressure categories  ?Blood pressure category SYSTOLIC ?(upper number)  DIASTOLIC ?(lower number)  ?Normal Less than 120 mm Hg and Less than 80 mm Hg  ?Elevated 120-129 mm Hg and Less than 80 mm Hg  ?High blood pressure: Stage 1 hypertension 130-139 mm Hg or 80-89 mm Hg  ?High blood pressure: Stage 2 hypertension 140 mm Hg or higher or 90 mm Hg or higher  ?Hypertensive crisis (consult your doctor immediately) Higher than 180 mm Hg and/or Higher than 120 mm Hg  ?Source: American Heart Association and American Stroke Association. ?For more on getting your blood pressure under control, buy Controlling Your Blood Pressure, a Special Health Report from [facility]. ? ? ?Blood Pressure Log ? ? ?Date ?  ?Time  ?Blood Pressure  ?Example: Nov 1 9 AM 124/78  ? ?    ? ?    ? ?    ? ?    ? ?    ? ?    ? ?    ? ?    ? ?  We discussed possible referral to psychology today, if you would like [place] to place there referral please call or send [place] a MyChart. Mental health is an important part of heart health. If interested, we would refer to Dr. Elias Else. He is located at [facility] at [facility]. He doe sin person and virtual appointments.  ?

## 2021-12-21 NOTE — Progress Notes (Signed)
Office Visit    Patient Name: Shelby Vazquez Date of Encounter: 12/21/2021  PCP:  Ria Bush, [facility]  Cardiologist:  Glenetta Hew, MD  Advanced Practice Provider:  No care team member to display Electrophysiologist:  None      Chief Complaint    Shelby Vazquez is a 71 y.o. female with a hx of hypertension, aortic atherosclerosis, hyperlipidemia, palpitations, coronary artery disease (coronary CTA with nonobstructive CAD), TIA, iron deficiency anemia, GERD, MDD  presents today for preoperative clearance  Past Medical History    Past Medical History:  Diagnosis Date   Abdominal aortic atherosclerosis ([place]) 10/2014   by CT scan   B12 deficiency    Chronic anxiety    Chronic cough    cyclical cough syndrome   Complication of anesthesia 9 yrs ago   awake and in severe pain with colonoscopy   Concussion    Diet-controlled diabetes mellitus ([place]) 02/22/2017   New dx 02/2017   GERD (gastroesophageal reflux disease)    severe leading to chronic cough and hoarseness   History of Epstein-Barr virus infection 2012   Hyperlipidemia    Hypertension    IBS (irritable bowel syndrome)    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, PB-8 probiotic, healthy diet and exercise   Mild ascending aorta dilation ([place]) 11/02/2017   By CT 09/2017 & 09/2018 - max diameter 4cm f/u annually    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   had surgery with 5 days of chemotherapy   Osteopenia 01/2015   T -0.8 spine, -1.7 hip   Pyelonephritis due to Escherichia coli 07/16/2017   Uterine prolapse    Vocal cord paralysis, bilateral partial 2001   evaluated at [facility] - did not require surgery   Past Surgical History:  Procedure Laterality Date   BUNIONECTOMY     [place] '2000   CATARACT EXTRACTION     right 01/2010, left may 2011   COLONOSCOPY  03/2018   diverticulosis, rpt 5 yrs ([name])   COLONOSCOPY WITH PROPOFOL N/A 03/31/2014   sessile serrated adenomas,  sigmoid diverticulosis, rpt 3 yrs Ronald Lobo V, MD)   CORONARY CALCIUM SCORE-CORONARY CTA  09/2017   Calcium score 53 . Normal right dominant coronary artery system with mild onset of CAD. Aneurysmal dilation of the ascending aorta with maximum diameter diameter of 4.0-3.8 centimeter. -Recommend annual CT/MRA December 2018   DILATION AND CURETTAGE OF UTERUS  '77   for molar pregnancy excision   ESOPHAGOGASTRODUODENOSCOPY N/A 03/31/2014   mult gastric polyps, mild mucosal hemorrhages; ESOPHAGOGASTRODUODENOSCOPY (EGD);  Surgeon: Cleotis Nipper, MD   ESOPHAGOGASTRODUODENOSCOPY ENDOSCOPY  april 2014   INTERSTIM IMPLANT PLACEMENT  2018   toe surgery Right 09/2018   TRANSTHORACIC ECHOCARDIOGRAM  09/2017   Normal LV size and function. EF 55-60%. GR 1 DD. No regional wall motion abnormalities. No significant valvular abnormalities.    Allergies  Allergies  Allergen Reactions   Augmentin [Amoxicillin-Pot Clavulanate] Other (See Comments)    Severe diarrhea, rash, worsened acid reflux   Erythromycin Rash    History of Present Illness    Shelby Vazquez is a 71 y.o. female with a hx of hypertension, aortic atherosclerosis, hyperlipidemia, palpitations, coronary artery disease (coronary CTA with nonobstructive CAD), TIA, iron deficiency anemia, GERD, MDD last seen 06/28/2021 via video visit..  Prior cardiac work-up includes echocardiogram 09/2017 revealing normal LVEF 55 to 40%, grade 1 diastolic dysfunction, mild dilation of ascending aorta 42 mm, trivial  MR.  She underwent cardiac CTA 09/2017 revealing coronary calcium score of 53 placing her in the 72nd percentile for age and sex pressure control.  She had mild nonobstructive coronary disease (prox LAD 25-50%, LCx 0-25%). Ascending aorta measured 40x79mm.  Last seen via video visit 06/2021 doing well from cardiac perspective.  She presents today for preoperative clearance for removal of deep implant right foot by Dr. Doran Durand of [facility]. Very  pleasant lady who is very active in her grandchildren's lives. Request to hold aspirin.  When she was contacted regarding her preop clearance she noted her blood pressure was elevated as well as some chest pressure.  Tells me Monday evening she had an episode of chest pain while sitting down to watch television program. Had not yet taken her evening medications. BP 155/97. BP at home has been in the 140s/-160s. Tells me yesterday she was "miserable" with BP 140/89, 150/88. She does feel she was stressed and understands this can contribute to high blood pressure. Understandably apprehensive about upcoming visit for cardiac clearance.   She works out at [employer] regularly since September using 7 different machines. Using 40 pounds resistance and up to 40 reps. Also does exercise bike and treadmill. Most often exercising one hour. Last went Monday. No exertional dyspnea nor chest pain with exercise.  Notes difficulty with her antidepressant medications and is following with Dr. Danise Mina for management, has appointment upcoming. Stressors related to family deaths including being executor of her brother's will, her aunt who passed suddenly of cancer, very close neighbor. Her sister-in-law has had multiple recent admissions with complications related to lung transplant and broken foot with plans to transfer to rehab center soon from hospital   EKGs/Labs/Other Studies Reviewed:   The following studies were reviewed today:  Echo 09/2017 Left ventricle: The cavity size was normal. Wall thickness was    normal. Systolic function was normal. The estimated ejection    fraction was in the range of 55% to 60%. Wall motion was normal;    there were no regional wall motion abnormalities. Doppler    parameters are consistent with abnormal left ventricular    relaxation (grade 1 diastolic dysfunction).  - Aortic valve: There was no stenosis.  - Aorta: Ascending aorta dimension: 42 mm.  - Ascending aorta: The  ascending aorta was mildly dilated.  - Mitral valve: There was trivial regurgitation.  - Right ventricle: The cavity size was normal. Systolic function    was normal.  - Tricuspid valve: Peak RV-RA gradient (S): 19 mm Hg.  - Pulmonary arteries: PA peak pressure: 27 mm Hg (S).  - Systemic veins: IVC measured 1.8 cm with < 50% respirophasic    variation, suggesting RA pressure 8 mmHg.   Impressions:   - Normal LV size with EF 55-60%. Normal RV size and systolic    function. No significant valvular abnormalities.   Cardiac CTA 09/2017 FINDINGS: A 120 kV prospective scan was triggered in the descending thoracic aorta at 111 HU's. Axial non-contrast 3 mm slices were carried out through the heart. The data set was analyzed on a dedicated work station and scored using the [place]. Gantry rotation speed was 250 msecs and collimation was .6 mm. 5 mg of iv Metoprolol and 0.8 mg of sl NTG was given. The 3D data set was reconstructed in 5% intervals of the 67-82 % of the R-R cycle. Diastolic phases were analyzed on a dedicated work station using MPR, MIP and VRT modes. The patient received 80  cc of contrast.   Aorta: Aneurysmal dilatation of the ascending aorta with maximum diameter 40 x 38 mm. No calcifications. No dissection.   Aortic Valve:  Trileaflet.  No calcifications.   Coronary Arteries:  Normal coronary origin.  Right dominance.   RCA is a large dominant artery that gives rise to PDA and PLA. There is trivial plaque in the proximal RCA.   Left main is a short artery that gives rise to LAD and LCX arteries.   LAD is a large and long vessel that gives rise to one diagonal artery and wraps around the apex. There is mild calcified plaque in the proximal LAD with associated stenosis 25-50%   LCX is a non-dominant artery that gives rise to one OM1 branch. There is minimal calcified plaque in the proximal LCX artery with associated stenosis 0-25%.   Other findings:    Normal pulmonary vein drainage into the left atrium.   Normal let atrial appendage without a thrombus.   Normal size of the pulmonary artery.   IMPRESSION: 1. Coronary calcium score of 53. This was 70 percentile for age and sex matched control.   2. Normal coronary origin with right dominance.   3. Mild non-obstructive CAD. Aggressive risk factor modification is recommended.   4. Aneurysmal dilatation of the ascending aorta with maximum diameter 40 x 38 mm. Annual follow up with CTA or MRA is recommended.  EKG:  EKG is ordered today.  The ekg ordered today demonstrates NSR 65 bpm with no acute ST/T wave changes.   Recent Labs: 04/17/2021: ALT 23; BUN 16; Creatinine, Ser 0.85; Hemoglobin 13.4; Platelets 390.0; Potassium 3.8; Sodium 136  Recent Lipid Panel    Component Value Date/Time   CHOL 137 04/17/2021 1332   TRIG 146.0 04/17/2021 1332   HDL 43.40 04/17/2021 1332   CHOLHDL 3 04/17/2021 1332   VLDL 29.2 04/17/2021 1332   LDLCALC 64 04/17/2021 1332   LDLDIRECT 109.0 12/25/2017 0835   Home Medications   Current Meds  Medication Sig   aspirin 81 MG tablet Take 1 tablet (81 mg total) by mouth daily.   Calcium Carbonate-Vitamin D 600-200 MG-UNIT CAPS Take 1 capsule by mouth daily.   famotidine (PEPCID) 40 MG tablet Take 1 tablet (40 mg total) by mouth at bedtime.   ibuprofen (ADVIL,MOTRIN) 200 MG tablet Take 600 mg by mouth every 6 (six) hours as needed for moderate pain.   loratadine (CLARITIN) 10 MG tablet Take 10 mg by mouth daily.   losartan (COZAAR) 25 MG tablet Take 0.5 tablets (12.5 mg total) by mouth daily.   methenamine (HIPREX) 1 g tablet Take 1 g by mouth 2 (two) times daily with a meal.    metoprolol tartrate (LOPRESSOR) 50 MG tablet TAKE 1 TABLET BY MOUTH TWICE DAILY. MAY TAKE AN EXTRA HALF TO 1 TABLET FOR PALPITATIONS AND/OR BLOOD PRESSURE   ondansetron (ZOFRAN-ODT) 4 MG disintegrating tablet DISSOLVE 1 TABLET(4 MG) ON THE TONGUE EVERY 8 HOURS AS NEEDED FOR  NAUSEA OR VOMITING   Peppermint Oil (IBGARD PO)    polyethylene glycol powder (GLYCOLAX/MIRALAX) 17 GM/SCOOP powder Take 1 Container by mouth daily. As needed    PRESCRIPTION MEDICATION Apply topically 2 (two) times daily. Estradiol Progesterone cream 0.05-30 mg/ml   Probiotic Product (PROBIOTIC PO) Take 2 capsules by mouth 2 (two) times daily.    RABEprazole (ACIPHEX) 20 MG tablet Take 20 mg by mouth daily.   rosuvastatin (CRESTOR) 20 MG tablet Take 1 tablet (20 mg total) by mouth every  evening.   venlafaxine XR (EFFEXOR XR) 37.5 MG 24 hr capsule Take 1 capsule (37.5 mg total) by mouth daily with breakfast.   Vitamin B Complex-C CAPS      Review of Systems      All other systems reviewed and are otherwise negative except as noted above.  Physical Exam    VS:  BP 120/80 (BP Location: Left Arm, Patient Position: Sitting, Cuff Size: Normal)    Pulse 65    Ht 5' 3.75" (1.619 m)    Wt 157 lb (71.2 kg)    SpO2 98%    BMI 27.16 kg/m  , BMI Body mass index is 27.16 kg/m.  Wt Readings from Last 3 Encounters:  12/21/21 157 lb (71.2 kg)  11/23/21 157 lb 1 oz (71.2 kg)  09/04/21 158 lb 9 oz (71.9 kg)    GEN: Well nourished, well developed, in no acute distress. HEENT: normal. Neck: Supple, no JVD, carotid bruits, or masses. Cardiac: RRR, no murmurs, rubs, or gallops. No clubbing, cyanosis, edema.  Radials/PT 2+ and equal bilaterally.  Respiratory:  Respirations regular and unlabored, clear to auscultation bilaterally. GI: Soft, nontender, nondistended. MS: No deformity or atrophy. Skin: Warm and dry, no rash. Neuro:  Strength and sensation are intact. Psych: Normal affect. Anxious.   Assessment & Plan    Preop clearance - Upcoming removal of deep implant right foot by Dr. Doran Durand of [facility].According to the Revised Cardiac Risk Index (RCRI), her Perioperative Risk of Major Cardiac Event is (%): 6.6. Her  Functional Capacity in METs is: 6.36 according to the Duke Activity Status Index  (DASI).  Per AHA/ACC she may proceed with the Planned Procedure without additional cardiovascular testing.  Will route to surgical team so they are aware.  She has coronary disease with no prior stenting and may hold aspirin as deemed necessary by surgical team.  Recommend prompt resumption after surgical intervention.  CAD - cardiac CTA 2018 moderate nonobstructive disease. Stable with no anginal symptoms. No indication for ischemic evaluation.  Isolated episode of nonexertional chest pain in setting of stress not concerning for angina. EKG today NSR with no acute ST/T wave changes. GDMT includes aspirin, metoprolol, rosuvastatin. Heart healthy diet and regular cardiovascular exercise encouraged.    HTN - Reports elevated SBP at home 140s-150s. Initial BP in clinic 120/80 and then 142/98 after discussing multiple stressors. As persistently elevated at home will Rx Losartan 12.5mg  QD. Start small dose to prevent hypotension. If still elevated >130/80 at follow up with Dr. Danise Mina this coming Monday consider increasing to 25mg  QD. Suspect anxiety and stress contributory to elevated blood pressure readings.   GERD - Follows with primary care. Presently on Aciphex per primary care.   HLD, LDL goal <70 - 03/2021 LDL 64. Continue Rosuvastatin 20mg  daily. Denies myalgias.   Palpitations - Well controlled on present dose Metoprolol.   MDD - Continue to follow with PCP. Upcoming appointment to readdress medical therapy as wishes to transition from Effexor to alternate agent. Offered referral to psychology Dr. Elias Else which she will consider. If she decides to pursue, she will send MyChart message and we will place referral. She has had lots of recent stressors with loss of family members and close friends.  Mild dilation ascending aorta - Echo 2018 9mm. CT angio 09/2019 upper levlel of normal 48mm. No indication for repeat monitoring at this time as normalized. Continue optimal BP control and  Rosuvastatin.   Disposition: Follow up in 3 month(s) with Glenetta Hew,  MD or APP.  Signed, Loel Dubonnet, NP 12/21/2021, 1:07 PM [place]

## 2021-12-24 ENCOUNTER — Ambulatory Visit: Payer: Medicare PPO | Admitting: Family Medicine

## 2021-12-24 ENCOUNTER — Encounter: Payer: Self-pay | Admitting: Family Medicine

## 2021-12-24 ENCOUNTER — Other Ambulatory Visit: Payer: Self-pay

## 2021-12-24 VITALS — BP 140/86 | HR 84 | Temp 97.5°F | Ht 63.75 in | Wt 158.0 lb

## 2021-12-24 DIAGNOSIS — Z636 Dependent relative needing care at home: Secondary | ICD-10-CM

## 2021-12-24 DIAGNOSIS — K582 Mixed irritable bowel syndrome: Secondary | ICD-10-CM | POA: Diagnosis not present

## 2021-12-24 DIAGNOSIS — M25551 Pain in right hip: Secondary | ICD-10-CM | POA: Diagnosis not present

## 2021-12-24 DIAGNOSIS — F331 Major depressive disorder, recurrent, moderate: Secondary | ICD-10-CM | POA: Diagnosis not present

## 2021-12-24 DIAGNOSIS — F411 Generalized anxiety disorder: Secondary | ICD-10-CM | POA: Diagnosis not present

## 2021-12-24 DIAGNOSIS — I10 Essential (primary) hypertension: Secondary | ICD-10-CM | POA: Diagnosis not present

## 2021-12-24 MED ORDER — DULOXETINE HCL 30 MG PO CPEP: 30.0000 mg | ORAL_CAPSULE | Freq: Every day | ORAL | 3 refills | Status: DC

## 2021-12-24 MED ORDER — LOSARTAN POTASSIUM 25 MG PO TABS: 25.0000 mg | ORAL_TABLET | Freq: Every day | ORAL | 3 refills | Status: DC

## 2021-12-24 NOTE — Assessment & Plan Note (Signed)
Chronic, will increase losartan to '25mg'$  daily. Update with effect. She will continue monitoring BP at home.  ?

## 2021-12-24 NOTE — Progress Notes (Signed)
Patient ID: Shelby Vazquez, female    DOB: [birth year], 71 y.o.   MRN: [id number]  This visit was conducted in person.  BP 140/86    Pulse 84    Temp (!) 97.5 F (36.4 C) (Temporal)    Ht 5' 3.75" (1.619 m)    Wt 158 lb (71.7 kg)    SpO2 96%    BMI 27.33 kg/m    CC: 6 wk f/u visit depression Subjective:   HPI: Shelby Vazquez is a 71 y.o. female presenting on 12/24/2021 for Depression (Wants to discuss changing Effexor. )   See prior note for details.  Enjoyed [place] trip last month however she notes R lateral hip pain that started at that time.   Last visit we started slow effexor taper - she is currently down to 37.'5mg'$  daily, but feels depression again worsening. She desires to fully transition off effexor - doesn't feel it ever controlled mood. Denies SI/HI. She has started going to the gym but she struggles to complete her workout (ie bike) due to IBS symptoms. Notes worsening IBS symptoms despite effexor. Physical activity seems to trigger GI symptoms. Last saw Dr Sigurd Sos Fall 2022. He's since retired. Upcoming appt this spring. Regular IBGard use hasn't helped as much as she'd like (2 capsules daily).   Saw cardiology last week for pre-op evaluation for removal of screw in R foot. Started on losartan 12.'5mg'$  daily, otherwise cleared to proceed with surgery, ok to hold aspirin. BP readings over the past week 133-151/84-99, HR 80-105. She also continues metoprolol '50mg'$  twice daily.      Relevant past medical, surgical, family and social history reviewed and updated as indicated. Interim medical history since our last visit reviewed. Allergies and medications reviewed and updated. Outpatient Medications Prior to Visit  Medication Sig Dispense Refill   aspirin 81 MG tablet Take 1 tablet (81 mg total) by mouth daily. 30 tablet    Calcium Carbonate-Vitamin D 600-200 MG-UNIT CAPS Take 1 capsule by mouth daily.     famotidine (PEPCID) 40 MG tablet Take 1 tablet (40 mg total) by  mouth at bedtime. 90 tablet 3   ibuprofen (ADVIL,MOTRIN) 200 MG tablet Take 600 mg by mouth every 6 (six) hours as needed for moderate pain.     loratadine (CLARITIN) 10 MG tablet Take 10 mg by mouth daily.     methenamine (HIPREX) 1 g tablet Take 1 g by mouth 2 (two) times daily with a meal.   1   metoprolol tartrate (LOPRESSOR) 50 MG tablet TAKE 1 TABLET BY MOUTH TWICE DAILY. MAY TAKE AN EXTRA HALF TO 1 TABLET FOR PALPITATIONS AND/OR BLOOD PRESSURE 270 tablet 3   ondansetron (ZOFRAN-ODT) 4 MG disintegrating tablet DISSOLVE 1 TABLET(4 MG) ON THE TONGUE EVERY 8 HOURS AS NEEDED FOR NAUSEA OR VOMITING 20 tablet 0   Peppermint Oil (IBGARD PO)      polyethylene glycol powder (GLYCOLAX/MIRALAX) 17 GM/SCOOP powder Take 1 Container by mouth daily. As needed      PRESCRIPTION MEDICATION Apply topically 2 (two) times daily. Estradiol Progesterone cream 0.05-30 mg/ml     Probiotic Product (PROBIOTIC PO) Take 2 capsules by mouth 2 (two) times daily.      RABEprazole (ACIPHEX) 20 MG tablet Take 20 mg by mouth daily.     rosuvastatin (CRESTOR) 20 MG tablet Take 1 tablet (20 mg total) by mouth every evening. 90 tablet 3   Vitamin B Complex-C CAPS      losartan (COZAAR)  25 MG tablet Take 0.5 tablets (12.5 mg total) by mouth daily. 15 tablet 2   venlafaxine XR (EFFEXOR XR) 37.5 MG 24 hr capsule Take 1 capsule (37.5 mg total) by mouth daily with breakfast. 90 capsule 1   No facility-administered medications prior to visit.     Per HPI unless specifically indicated in ROS section below Review of Systems  Objective:  BP 140/86    Pulse 84    Temp (!) 97.5 F (36.4 C) (Temporal)    Ht 5' 3.75" (1.619 m)    Wt 158 lb (71.7 kg)    SpO2 96%    BMI 27.33 kg/m   Wt Readings from Last 3 Encounters:  12/24/21 158 lb (71.7 kg)  12/21/21 157 lb (71.2 kg)  11/23/21 157 lb 1 oz (71.2 kg)      Physical Exam Vitals and nursing note reviewed.  Constitutional:      Appearance: Normal appearance. She is not  ill-appearing.  Eyes:     Extraocular Movements: Extraocular movements intact.     Pupils: Pupils are equal, round, and reactive to light.  Cardiovascular:     Rate and Rhythm: Normal rate and regular rhythm.     Pulses: Normal pulses.     Heart sounds: Normal heart sounds. No murmur heard. Pulmonary:     Effort: Pulmonary effort is normal. No respiratory distress.     Breath sounds: Normal breath sounds. No wheezing, rhonchi or rales.  Musculoskeletal:     Right lower leg: No edema.     Left lower leg: No edema.     Comments:  Neg seated SLR on right. No pain with int/ext rotation at hip. Discomfort to palpation at R trochanteric bursa, no pain at R SIJ or sciatic notch  Skin:    General: Skin is warm and dry.     Findings: No rash.  Neurological:     Mental Status: She is alert.  Psychiatric:        Mood and Affect: Mood normal.        Behavior: Behavior normal.      Results for orders placed or performed in visit on 11/28/21  HM DIABETES EYE EXAM  Result Value Ref Range   HM Diabetic Eye Exam No Retinopathy No Retinopathy   Lab Results  Component Value Date   CREATININE 0.85 04/17/2021   BUN 16 04/17/2021   NA 136 04/17/2021   K 3.8 04/17/2021   CL 100 04/17/2021   CO2 28 04/17/2021   Depression screen PHQ 2/9 12/24/2021 11/23/2021 04/13/2021 03/24/2020 02/24/2019  Decreased Interest 1 1 0 0 0  Down, Depressed, Hopeless 1 1 0 0 0  PHQ - 2 Score 2 2 0 0 0  Altered sleeping 1 1 0 0 0  Tired, decreased energy 2 1 0 0 0  Change in appetite 2 1 0 0 0  Feeling bad or failure about yourself  2 2 0 0 0  Trouble concentrating 2 1 0 0 0  Moving slowly or fidgety/restless 0 1 0 0 0  Suicidal thoughts 0 1 0 0 0  PHQ-9 Score 11 10 0 0 0  Difficult doing work/chores - - Not difficult at all Not difficult at all Not difficult at all  Some recent data might be hidden    GAD 7 : Generalized Anxiety Score 12/24/2021 11/23/2021  Nervous, Anxious, on Edge 1 2  Control/stop worrying 2 2   Worry too much - different things 2 2  Trouble  relaxing 3 3  Restless 0 1  Easily annoyed or irritable 3 2  Afraid - awful might happen 2 2  Total GAD 7 Score 13 14   Assessment & Plan:  This visit occurred during the SARS-CoV-2 public health emergency.  Safety protocols were in place, including screening questions prior to the visit, additional usage of staff PPE, and extensive cleaning of exam room while observing appropriate contact time as indicated for disinfecting solutions.   Problem List Items Addressed This Visit     GAD (generalized anxiety disorder)    Anxiety > depression - see below.       Relevant Medications   DULoxetine (CYMBALTA) 30 MG capsule   Essential hypertension    Chronic, will increase losartan to '25mg'$  daily. Update with effect. She will continue monitoring BP at home.       Relevant Medications   losartan (COZAAR) 25 MG tablet   Irritable bowel syndrome (IBS)    Chronic, severe. Followed by GI. Discussed IBGard use.        MDD (major depressive disorder), recurrent episode, moderate (HCC) - Primary    Chronic, ongoing struggle.  She desires to transition from effexor to cymbalta.  Will stop effexor one day and next day start cymbalta '30mg'$  daily.  Update with effect in 2-3 wks,  Reviewed may take 4-6 wks to get full antidepressant effect.  RTC 6 wks f/u visit.  Discussed therapy referral - she declines for now but will continue considering this option.       Relevant Medications   DULoxetine (CYMBALTA) 30 MG capsule   Caregiver stress   Right hip pain    Exam suspicious for R greater trochanteric bursitis.  Provided with exercises for this.  H/o L hip bursitis 2015.         Meds ordered this encounter  Medications   losartan (COZAAR) 25 MG tablet    Sig: Take 1 tablet (25 mg total) by mouth daily.    Dispense:  30 tablet    Refill:  3   DULoxetine (CYMBALTA) 30 MG capsule    Sig: Take 1 capsule (30 mg total) by mouth daily.     Dispense:  90 capsule    Refill:  3    To replace effexor XR 37.'5mg'$    No orders of the defined types were placed in this encounter.   Patient Instructions  Go ahead and increase losartan to '25mg'$  full tablet daily in the morning.  Stop effexor XR 37.'5mg'$ . Start in its place cymbalta '30mg'$  daily.  Update [place] in 2-3 weeks.  Try exercises provided today for hip bursitis.  Return in another 6 weeks for follow up visit.   Follow up plan: Return in about 6 weeks (around 02/04/2022) for follow up visit.  Ria Bush, MD

## 2021-12-24 NOTE — Assessment & Plan Note (Addendum)
Chronic, ongoing struggle.  ?She desires to transition from effexor to cymbalta.  ?Will stop effexor one day and next day start cymbalta '30mg'$  daily.  ?Update with effect in 2-3 wks,  ?Reviewed may take 4-6 wks to get full antidepressant effect.  ?RTC 6 wks f/u visit.  ?Discussed therapy referral - she declines for now but will continue considering this option.  ?

## 2021-12-24 NOTE — Patient Instructions (Addendum)
Go ahead and increase losartan to '25mg'$  full tablet daily in the morning.  ?Stop effexor XR 37.'5mg'$ . Start in its place cymbalta '30mg'$  daily.  ?Update [place] in 2-3 weeks.  ?Try exercises provided today for hip bursitis.  ?Return in another 6 weeks for follow up visit.  ?

## 2021-12-24 NOTE — Assessment & Plan Note (Signed)
Anxiety > depression - see below.  ?

## 2021-12-24 NOTE — Assessment & Plan Note (Signed)
Chronic, severe. Followed by GI. Discussed IBGard use.   ?

## 2021-12-24 NOTE — Assessment & Plan Note (Addendum)
Exam suspicious for R greater trochanteric bursitis.  ?Provided with exercises for this.  ?H/o L hip bursitis 2015.  ?

## 2022-01-08 DIAGNOSIS — T8484XA Pain due to internal orthopedic prosthetic devices, implants and grafts, initial encounter: Secondary | ICD-10-CM | POA: Diagnosis not present

## 2022-01-16 ENCOUNTER — Ambulatory Visit: Payer: Medicare PPO | Admitting: Family Medicine

## 2022-01-23 DIAGNOSIS — N930 Postcoital and contact bleeding: Secondary | ICD-10-CM | POA: Diagnosis not present

## 2022-01-24 ENCOUNTER — Other Ambulatory Visit (HOSPITAL_BASED_OUTPATIENT_CLINIC_OR_DEPARTMENT_OTHER): Payer: Self-pay | Admitting: Family

## 2022-01-24 DIAGNOSIS — I10 Essential (primary) hypertension: Secondary | ICD-10-CM

## 2022-01-30 ENCOUNTER — Ambulatory Visit: Payer: Medicare PPO | Admitting: Family Medicine

## 2022-02-20 ENCOUNTER — Ambulatory Visit: Payer: Medicare PPO | Admitting: Family Medicine

## 2022-02-20 ENCOUNTER — Encounter: Payer: Self-pay | Admitting: Family Medicine

## 2022-02-20 VITALS — BP 140/72 | HR 59 | Temp 97.2°F | Ht 63.75 in | Wt 156.4 lb

## 2022-02-20 DIAGNOSIS — F331 Major depressive disorder, recurrent, moderate: Secondary | ICD-10-CM | POA: Diagnosis not present

## 2022-02-20 DIAGNOSIS — F411 Generalized anxiety disorder: Secondary | ICD-10-CM

## 2022-02-20 DIAGNOSIS — K582 Mixed irritable bowel syndrome: Secondary | ICD-10-CM

## 2022-02-20 MED ORDER — DULOXETINE HCL 60 MG PO CPEP: 60.0000 mg | ORAL_CAPSULE | Freq: Every day | ORAL | 6 refills | Status: DC

## 2022-02-20 NOTE — Progress Notes (Signed)
? ? Patient ID: Shelby Vazquez, female    DOB: 1951/07/22, 71 y.o.   MRN: [id number] ? ?This visit was conducted in person. ? ?BP 140/72   Pulse (!) 59   Temp (!) 97.2 ?F (36.2 ?C) (Temporal)   Ht 5' 3.75" (1.619 m)   Wt 156 lb 6 oz (70.9 kg)   SpO2 97%   BMI 27.05 kg/m?   ?BP Readings from Last 3 Encounters:  ?02/20/22 140/72  ?12/24/21 140/86  ?12/21/21 120/80  ? ?CC: 2 mo f/u visit  ?Subjective:  ? ?HPI: ?Shelby Vazquez is a 71 y.o. female presenting on 02/20/2022 for Follow-up (2 mo dep. And medication discussion) ? ? ?See prior note for details.  ?Pt desired slow effexor taper however as dose decreased, noted recurring depression. Last visit we transitioned off effexor onto cymbalta - started at '30mg'$  daily - in hopes for smoother taper experience.  ?"I'm at the bottom of the well".  ?Significant worsening of symptoms, both depressive and GI.  ? ?IBs symptoms have worsened.  ?Effexor also may have helped control IBS symptoms.  ?IBS - last saw Dr Sigurd Sos Fall 2022. He's since retired. Upcoming appt this spring. Regular IBGard use hasn't helped as much as she'd like (2 capsules daily).  ? ?Ongoing family stressors. 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 BIL passed away last month - burst AAA.  ? ?Upcoming 3 wk trip to [place] for 50th wedding anniversary.  Worried her IBS will preclude her enjoyment of this. ?Contracts for safety.  ?   ? ?Relevant past medical, surgical, family and social history reviewed and updated as indicated. Interim medical history since our last visit reviewed. ?Allergies and medications reviewed and updated. ?Outpatient Medications Prior to Visit  ?Medication Sig Dispense Refill  ? aspirin 81 MG tablet Take 1 tablet (81 mg total) by mouth daily. 30 tablet   ? Calcium Carbonate-Vitamin D 600-200 MG-UNIT CAPS Take 1 capsule by mouth daily.    ? estradiol (ESTRACE) 0.1 MG/GM vaginal cream Place 1 g vaginally daily at 12 noon.    ? famotidine (PEPCID) 40 MG tablet Take 1  tablet (40 mg total) by mouth at bedtime. 90 tablet 3  ? ibuprofen (ADVIL,MOTRIN) 200 MG tablet Take 600 mg by mouth every 6 (six) hours as needed for moderate pain.    ? loratadine (CLARITIN) 10 MG tablet Take 10 mg by mouth daily.    ? losartan (COZAAR) 25 MG tablet Take 1 tablet (25 mg total) by mouth daily. 30 tablet 3  ? methenamine (HIPREX) 1 g tablet Take 1 g by mouth 2 (two) times daily with a meal.   1  ? metoprolol tartrate (LOPRESSOR) 50 MG tablet TAKE 1 TABLET BY MOUTH TWICE DAILY. MAY TAKE AN EXTRA HALF TO 1 TABLET FOR PALPITATIONS AND/OR BLOOD PRESSURE 270 tablet 3  ? ondansetron (ZOFRAN-ODT) 4 MG disintegrating tablet DISSOLVE 1 TABLET(4 MG) ON THE TONGUE EVERY 8 HOURS AS NEEDED FOR NAUSEA OR VOMITING 20 tablet 0  ? Peppermint Oil (IBGARD PO)     ? polyethylene glycol powder (GLYCOLAX/MIRALAX) 17 GM/SCOOP powder Take 1 Container by mouth daily. As needed     ? PRESCRIPTION MEDICATION Apply topically 2 (two) times daily. Estradiol Progesterone cream 0.05-30 mg/ml    ? Probiotic Product (PROBIOTIC PO) Take 2 capsules by mouth 2 (two) times daily.     ? RABEprazole (ACIPHEX) 20 MG tablet Take 20 mg by mouth daily.    ? rosuvastatin (CRESTOR) 20 MG  tablet Take 1 tablet (20 mg total) by mouth every evening. 90 tablet 3  ? Vitamin B Complex-C CAPS     ? DULoxetine (CYMBALTA) 30 MG capsule Take 1 capsule (30 mg total) by mouth daily. 90 capsule 3  ? ?No facility-administered medications prior to visit.  ?  ? ?Per HPI unless specifically indicated in ROS section below ?Review of Systems ? ?Objective:  ?BP 140/72   Pulse (!) 59   Temp (!) 97.2 ?F (36.2 ?C) (Temporal)   Ht 5' 3.75" (1.619 m)   Wt 156 lb 6 oz (70.9 kg)   SpO2 97%   BMI 27.05 kg/m?   ?Wt Readings from Last 3 Encounters:  ?02/20/22 156 lb 6 oz (70.9 kg)  ?12/24/21 158 lb (71.7 kg)  ?12/21/21 157 lb (71.2 kg)  ?  ?  ?Physical Exam ?Vitals and nursing note reviewed.  ?Constitutional:   ?   Appearance: Normal appearance. She is not  ill-appearing.  ?Neurological:  ?   Mental Status: She is alert.  ?Psychiatric:     ?   Mood and Affect: Mood is anxious and depressed. Affect is tearful.  ?   Comments: Tearful with discussion of worsened depression/IBS symptoms   ? ?   ?Results for orders placed or performed in visit on 11/28/21  ?HM DIABETES EYE EXAM  ?Result Value Ref Range  ? HM Diabetic Eye Exam No Retinopathy No Retinopathy  ? ? ?  02/20/2022  ? 12:19 PM 12/24/2021  ? 12:43 PM 11/23/2021  ? 12:40 PM 04/13/2021  ?  3:42 PM 03/24/2020  ? 12:17 PM  ?Depression screen PHQ 2/9  ?Decreased Interest '3 1 1 '$ 0 0  ?Down, Depressed, Hopeless '3 1 1 '$ 0 0  ?PHQ - 2 Score '6 2 2 '$ 0 0  ?Altered sleeping '3 1 1 '$ 0 0  ?Tired, decreased energy '3 2 1 '$ 0 0  ?Change in appetite '3 2 1 '$ 0 0  ?Feeling bad or failure about yourself  '3 2 2 '$ 0 0  ?Trouble concentrating '3 2 1 '$ 0 0  ?Moving slowly or fidgety/restless 3 0 1 0 0  ?Suicidal thoughts 2 0 1 0 0  ?PHQ-9 Score '26 11 10 '$ 0 0  ?Difficult doing work/chores Somewhat difficult   Not difficult at all Not difficult at all  ?  ? ?  02/20/2022  ? 12:19 PM 12/24/2021  ? 12:43 PM 11/23/2021  ? 12:40 PM  ?GAD 7 : Generalized Anxiety Score  ?Nervous, Anxious, on Edge '3 1 2  '$ ?Control/stop worrying '3 2 2  '$ ?Worry too much - different things '3 2 2  '$ ?Trouble relaxing '3 3 3  '$ ?Restless 2 0 1  ?Easily annoyed or irritable '3 3 2  '$ ?Afraid - awful might happen '3 2 2  '$ ?Total GAD 7 Score '20 13 14  '$ ?Anxiety Difficulty Very difficult    ? ?Assessment & Plan:  ? ?Problem List Items Addressed This Visit   ? ? GAD (generalized anxiety disorder)  ?  Equal anxiety and depression-see below ? ?  ?  ? Relevant Medications  ? DULoxetine (CYMBALTA) 60 MG capsule  ? Other Relevant Orders  ? Ambulatory referral to Psychiatry  ? Irritable bowel syndrome (IBS)  ?  Chronic, longstanding history, severe - followed by GI.  Previously saw Dr. Cristina Gong but he has since retired-pending establishing with a new GI doctor at [facility]. ?I asked her to call and schedule appointment given  worsening symptoms. ?Discussed possible contribution of Cymbalta worsening diarrhea-  however she states this is a chronic issue so likely not related. ? ?  ?  ? MDD (major depressive disorder), recurrent episode, moderate ([place] [place]) - Primary  ?  Chronic, severe. ?Endorsing passive suicidal ideation however contracts for safety- grandchild is protective. ?Overall worsening with transition from Effexor to Cymbalta-recommend increasing Cymbalta to 60 mg daily-she may take 30 mg twice daily and 60 mg dose will be at pharmacy. ?Also recommend psychiatry evaluation to optimize medication regimen, discussed alternative therapeutic options.  She is amenable to this-referral placed. ?Given severity of depression, I did recommend close follow-up and asked her to return to clinic in 3 weeks. ? ?  ?  ? Relevant Medications  ? DULoxetine (CYMBALTA) 60 MG capsule  ? Other Relevant Orders  ? Ambulatory referral to Psychiatry  ?  ? ?Meds ordered this encounter  ?Medications  ? DULoxetine (CYMBALTA) 60 MG capsule  ?  Sig: Take 1 capsule (60 mg total) by mouth daily.  ?  Dispense:  30 capsule  ?  Refill:  6  ?  Note new dose  ? ?Orders Placed This Encounter  ?Procedures  ? Ambulatory referral to Psychiatry  ?  Referral Priority:   Routine  ?  Referral Type:   Psychiatric  ?  Referral Reason:   Specialty Services Required  ?  Requested Specialty:   Psychiatry  ?  Number of Visits Requested:   1  ? ? ?Patient Instructions  ?Increase cymbalta to '60mg'$  daily. New dose sent to local pharmacy. Let us know if any worsening diarrhea with this.  ?Schedule appointment with GI for worsening IBS in interim.  ?We will refer you to psychiatrist for further evaluation/treatment of severe depression/anxiety.  ?Return in 3 weeks for follow up visit.  ? ?Follow up plan: ?Return in about 3 weeks (around 03/13/2022), or if symptoms worsen or fail to improve, for follow up visit. ? ?Ria Bush, MD   ?

## 2022-02-20 NOTE — Patient Instructions (Addendum)
Increase cymbalta to '60mg'$  daily. New dose sent to local pharmacy. Let us know if any worsening diarrhea with this.  ?Schedule appointment with GI for worsening IBS in interim.  ?We will refer you to psychiatrist for further evaluation/treatment of severe depression/anxiety.  ?Return in 3 weeks for follow up visit.  ?

## 2022-02-21 NOTE — Assessment & Plan Note (Signed)
Equal anxiety and depression-see below ?

## 2022-02-21 NOTE — Assessment & Plan Note (Addendum)
Chronic, severe. ?Endorsing passive suicidal ideation however contracts for safety- grandchild is protective. ?Overall worsening with transition from Effexor to Cymbalta-recommend increasing Cymbalta to 60 mg daily-she may take 30 mg twice daily and 60 mg dose will be at pharmacy. ?Also recommend psychiatry evaluation to optimize medication regimen, discussed alternative therapeutic options.  She is amenable to this-referral placed. ?Given severity of depression, I did recommend close follow-up and asked her to return to clinic in 3 weeks. ?

## 2022-02-21 NOTE — Assessment & Plan Note (Addendum)
Chronic, longstanding history, severe - followed by GI.  Previously saw Dr. Cristina Gong but he has since retired-pending establishing with a new GI doctor at [facility]. ?I asked her to call and schedule appointment given worsening symptoms. ?Discussed possible contribution of Cymbalta worsening diarrhea- however she states this is a chronic issue so likely not related. ?

## 2022-03-13 ENCOUNTER — Ambulatory Visit: Payer: Medicare PPO | Admitting: Family Medicine

## 2022-03-18 NOTE — Progress Notes (Unsigned)
Office Visit    Patient Name: Shelby Vazquez Date of Encounter: 03/19/2022  PCP:  Ria Bush, [facility]  Cardiologist:  Glenetta Hew, MD  Advanced Practice Provider:  No care team member to display Electrophysiologist:  None      Chief Complaint    Shelby Vazquez is a 71 y.o. female with a hx of hypertension, aortic atherosclerosis, hyperlipidemia, palpitations, coronary artery disease (coronary CTA with nonobstructive CAD), TIA, iron deficiency anemia, GERD, MDD  presents today for hypertension follow up.  Past Medical History    Past Medical History:  Diagnosis Date   Abdominal aortic atherosclerosis ([place]) 10/2014   by CT scan   B12 deficiency    Chronic anxiety    Chronic cough    cyclical cough syndrome   Complication of anesthesia 9 yrs ago   awake and in severe pain with colonoscopy   Concussion    Diet-controlled diabetes mellitus ([place]) 02/22/2017   New dx 02/2017   GERD (gastroesophageal reflux disease)    severe leading to chronic cough and hoarseness   History of Epstein-Barr virus infection 2012   Hyperlipidemia    Hypertension    IBS (irritable bowel syndrome)    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, PB-8 probiotic, healthy diet and exercise   Mild ascending aorta dilation ([place]) 11/02/2017   By CT 09/2017 & 09/2018 - max diameter 4cm f/u annually    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   had surgery with 5 days of chemotherapy   Osteopenia 01/2015   T -0.8 spine, -1.7 hip   Pyelonephritis due to Escherichia coli 07/16/2017   Uterine prolapse    Vocal cord paralysis, bilateral partial 2001   evaluated at [facility] - did not require surgery   Past Surgical History:  Procedure Laterality Date   BUNIONECTOMY     [place] '2000   CATARACT EXTRACTION     right 01/2010, left may 2011   COLONOSCOPY  03/2018   diverticulosis, rpt 5 yrs ([name])   COLONOSCOPY WITH PROPOFOL N/A 03/31/2014   sessile serrated adenomas,  sigmoid diverticulosis, rpt 3 yrs Ronald Lobo V, MD)   CORONARY CALCIUM SCORE-CORONARY CTA  09/2017   Calcium score 53 . Normal right dominant coronary artery system with mild onset of CAD. Aneurysmal dilation of the ascending aorta with maximum diameter diameter of 4.0-3.8 centimeter. -Recommend annual CT/MRA December 2018   DILATION AND CURETTAGE OF UTERUS  '77   for molar pregnancy excision   ESOPHAGOGASTRODUODENOSCOPY N/A 03/31/2014   mult gastric polyps, mild mucosal hemorrhages; ESOPHAGOGASTRODUODENOSCOPY (EGD);  Surgeon: Cleotis Nipper, MD   ESOPHAGOGASTRODUODENOSCOPY ENDOSCOPY  april 2014   INTERSTIM IMPLANT PLACEMENT  2018   toe surgery Right 09/2018   TRANSTHORACIC ECHOCARDIOGRAM  09/2017   Normal LV size and function. EF 55-60%. GR 1 DD. No regional wall motion abnormalities. No significant valvular abnormalities.    Allergies  Allergies  Allergen Reactions   Augmentin [Amoxicillin-Pot Clavulanate] Other (See Comments)    Severe diarrhea, rash, worsened acid reflux   Erythromycin Rash    History of Present Illness    Shelby Vazquez is a 71 y.o. female with a hx of hypertension, aortic atherosclerosis, hyperlipidemia, palpitations, coronary artery disease (coronary CTA with nonobstructive CAD), TIA, iron deficiency anemia, GERD, MDD last seen 12/21/21  Prior cardiac work-up includes echocardiogram 09/2017 revealing normal LVEF 55 to 40%, grade 1 diastolic dysfunction, mild dilation of ascending aorta 42 mm, trivial MR.  She underwent cardiac CTA 09/2017 revealing coronary calcium score of 53 placing her in the 72nd percentile for age and sex pressure control.  She had mild nonobstructive coronary disease (prox LAD 25-50%, LCx 0-25%). Ascending aorta measured 40x60m.   She was seen 12/20/2021 for preop clearance for removal of deep implant right foot by Ortho.  She had an episode of chest discomfort earlier that week while sitting and watching television.  BP at home had been  [id number].  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 Chest pain thought to be non cardiac, clearance for surgery provided, and started on low dose Losartan 12.'5mg'$  QD. At follow up 12/24/21 with her PPC dose was increased to '25mg'$  QD.   She presents today for follow-up. Enjoys spending time with her grandchildren.  She works out at [employer] Since last seen her sister in law has passed away. This has been very difficult for her. Notes significant fatigue and stress. Tells me she started Cymbalta but felt more depressed and had more difficulties with her IBS with diarrhea. Felt the Lexapro had similar effects but not as bad. Her referral to psychiatry has been cancelled as she declined their soonest available which was August 2nd. Previously followed with Dr. BCristina Gongof gastroenterology bu he has since retired. Notes more triggers of her IBS due to stress but first available not until end of June. June 1st [place] upcoming [place] to [place] Tells me by the afternoon feels like "I have been drugged" and fatigued as if she can hardly move and feels it is an extreme effort to move. Feels she can't focus. She is monitoring her blood pressure at home with readings most often 130s/80s.   EKGs/Labs/Other Studies Reviewed:   The following studies were reviewed today:  Echo 09/2017 Left ventricle: The cavity size was normal. Wall thickness was    normal. Systolic function was normal. The estimated ejection    fraction was in the range of 55% to 60%. Wall motion was normal;    there were no regional wall motion abnormalities. Doppler    parameters are consistent with abnormal left ventricular    relaxation (grade 1 diastolic dysfunction).  - Aortic valve: There was no stenosis.  - Aorta: Ascending aorta dimension: 42 mm.  - Ascending aorta: The ascending aorta was mildly  dilated.  - Mitral valve: There was trivial regurgitation.  - Right ventricle: The cavity size was normal. Systolic function    was normal.  - Tricuspid valve: Peak RV-RA gradient (S): 19 mm Hg.  - Pulmonary arteries: PA peak pressure: 27 mm Hg (S).  - Systemic veins: IVC measured 1.8 cm with < 50% respirophasic    variation, suggesting RA pressure 8 mmHg.   Impressions:   - Normal LV size with EF 55-60%. Normal RV size and systolic    function. No significant valvular abnormalities.   Cardiac CTA 09/2017 FINDINGS: A 120 kV prospective scan was triggered in the descending thoracic aorta at 111 HU's. Axial non-contrast 3 mm slices were carried out through the heart. The data set was analyzed on a dedicated work station and scored using the [employer] Gantry rotation speed was 250 msecs and collimation was .6 mm. 5 mg of iv Metoprolol and 0.8 mg of sl NTG was given. The 3D data set was reconstructed in 5% intervals of the 67-82 % of the R-R cycle. Diastolic phases were  analyzed on a dedicated work station using MPR, MIP and VRT modes. The patient received 80 cc of contrast.   Aorta: Aneurysmal dilatation of the ascending aorta with maximum diameter 40 x 38 mm. No calcifications. No dissection.   Aortic Valve:  Trileaflet.  No calcifications.   Coronary Arteries:  Normal coronary origin.  Right dominance.   RCA is a large dominant artery that gives rise to PDA and PLA. There is trivial plaque in the proximal RCA.   Left main is a short artery that gives rise to LAD and LCX arteries.   LAD is a large and long vessel that gives rise to one diagonal artery and wraps around the apex. There is mild calcified plaque in the proximal LAD with associated stenosis 25-50%   LCX is a non-dominant artery that gives rise to one OM1 branch. There is minimal calcified plaque in the proximal LCX artery with associated stenosis 0-25%.   Other findings:   Normal pulmonary vein drainage  into the left atrium.   Normal let atrial appendage without a thrombus.   Normal size of the pulmonary artery.   IMPRESSION: 1. Coronary calcium score of 53. This was 23 percentile for age and sex matched control.   2. Normal coronary origin with right dominance.   3. Mild non-obstructive CAD. Aggressive risk factor modification is recommended.   4. Aneurysmal dilatation of the ascending aorta with maximum diameter 40 x 38 mm. Annual follow up with CTA or MRA is recommended.  EKG:  No EKG today.  Recent Labs: 04/17/2021: ALT 23; BUN 16; Creatinine, Ser 0.85; Hemoglobin 13.4; Platelets 390.0; Potassium 3.8; Sodium 136  Recent Lipid Panel    Component Value Date/Time   CHOL 137 04/17/2021 1332   TRIG 146.0 04/17/2021 1332   HDL 43.40 04/17/2021 1332   CHOLHDL 3 04/17/2021 1332   VLDL 29.2 04/17/2021 1332   LDLCALC 64 04/17/2021 1332   LDLDIRECT 109.0 12/25/2017 0835   Home Medications   Current Meds  Medication Sig   aspirin 81 MG tablet Take 1 tablet (81 mg total) by mouth daily.   Calcium Carbonate-Vitamin D 600-200 MG-UNIT CAPS Take 1 capsule by mouth daily.   DULoxetine (CYMBALTA) 60 MG capsule Take 1 capsule (60 mg total) by mouth daily.   famotidine (PEPCID) 40 MG tablet Take 1 tablet (40 mg total) by mouth at bedtime.   ibuprofen (ADVIL,MOTRIN) 200 MG tablet Take 600 mg by mouth every 6 (six) hours as needed for moderate pain.   loratadine (CLARITIN) 10 MG tablet Take 10 mg by mouth daily.   methenamine (HIPREX) 1 g tablet Take 1 g by mouth 2 (two) times daily with a meal.    metoprolol tartrate (LOPRESSOR) 50 MG tablet TAKE 1 TABLET BY MOUTH TWICE DAILY. MAY TAKE AN EXTRA HALF TO 1 TABLET FOR PALPITATIONS AND/OR BLOOD PRESSURE   ondansetron (ZOFRAN-ODT) 4 MG disintegrating tablet DISSOLVE 1 TABLET(4 MG) ON THE TONGUE EVERY 8 HOURS AS NEEDED FOR NAUSEA OR VOMITING   Peppermint Oil (IBGARD PO)    polyethylene glycol powder (GLYCOLAX/MIRALAX) 17 GM/SCOOP powder Take  1 Container by mouth daily. As needed    Probiotic Product (PROBIOTIC PO) Take 1 capsule by mouth daily.   RABEprazole (ACIPHEX) 20 MG tablet Take 20 mg by mouth daily.   rosuvastatin (CRESTOR) 20 MG tablet Take 1 tablet (20 mg total) by mouth every evening.   Vitamin B Complex-C CAPS    [DISCONTINUED] losartan (COZAAR) 25 MG tablet Take 1 tablet (25  mg total) by mouth daily. (Patient taking differently: Take 25 mg by mouth daily. Out currently, was taking the whole, patient unsure about if dosing is correct)     Review of Systems      All other systems reviewed and are otherwise negative except as noted above.  Physical Exam    VS:  BP 132/82   Pulse 76   Ht 5' 4.5" (1.638 m)   Wt 156 lb (70.8 kg)   BMI 26.36 kg/m  , BMI Body mass index is 26.36 kg/m.  Wt Readings from Last 3 Encounters:  03/19/22 156 lb (70.8 kg)  02/20/22 156 lb 6 oz (70.9 kg)  12/24/21 158 lb (71.7 kg)    GEN: Well nourished, well developed, in no acute distress. HEENT: normal. Neck: Supple, no JVD, carotid bruits, or masses. Cardiac: RRR, no murmurs, rubs, or gallops. No clubbing, cyanosis, edema.  Radials/PT 2+ and equal bilaterally.  Respiratory:  Respirations regular and unlabored, clear to auscultation bilaterally. GI: Soft, nontender, nondistended. MS: No deformity or atrophy. Skin: Warm and dry, no rash. Neuro:  Strength and sensation are intact. Psych: Normal affect. Anxious.   Assessment & Plan    CAD - Cardiac CTA 2018 moderate nonobstructive disease. Stable with no anginal symptoms. No indication for ischemic evaluation. GDMT includes aspirin, metoprolol, rosuvastatin. Heart healthy diet and regular cardiovascular exercise encouraged.    HTN -  Suspect anxiety and stress contributory to elevated blood pressure readings. Well controlled on Losartan '25mg'$  daily.   GERD - Follows with primary care. Presently on Aciphex per primary care.   IBS - Multiple recent flares due to stressors. Is  working to get an appointment with gastroenterology.   HLD, LDL goal <70 - 03/2021 LDL 64. Continue Rosuvastatin '20mg'$  daily. Denies myalgias. Update direct LDL, CMp today.   Palpitations - Well controlled on present dose Metoprolol.   Fatigue - Anticipate MDD contributory, detailed below. To rule out alternate etiology will obtain CMP, CBC, BNP, thyroid panel.  MDD - Continue to follow with PCP. Has been referred to psychiatry. She has had lots of recent stressors with loss of family members and close friends.Did not tolerate Effexor well and was transitioned to Cymbalta. Noted increase in diarrhea with IBS and discontinued Cymbalta. Encouraged her to reach out to PCP. She was referred to psychiatry but could not get an appointment til August which was understandably frustrated. Referred to psychology today, Dr. Elias Else. Passive suicidal ideation but continues to contracts for safety due to her grandchild.   Mild dilation ascending aorta - Echo 2018 62m. CT angio 09/2019 upper levlel of normal 384m No indication for repeat monitoring at this time as normalized. Continue optimal BP control and Rosuvastatin.   Disposition: Follow up in 3 month(s)  with DaGlenetta HewMD or APP.  Signed, CaLoel DubonnetNP 03/19/2022, 11:26 AM [place]

## 2022-03-19 ENCOUNTER — Ambulatory Visit (HOSPITAL_BASED_OUTPATIENT_CLINIC_OR_DEPARTMENT_OTHER): Payer: Medicare PPO | Admitting: Family

## 2022-03-19 ENCOUNTER — Encounter (HOSPITAL_BASED_OUTPATIENT_CLINIC_OR_DEPARTMENT_OTHER): Payer: Self-pay | Admitting: Family

## 2022-03-19 VITALS — BP 132/82 | HR 76 | Ht 64.5 in | Wt 156.0 lb

## 2022-03-19 DIAGNOSIS — I25118 Atherosclerotic heart disease of native coronary artery with other forms of angina pectoris: Secondary | ICD-10-CM | POA: Diagnosis not present

## 2022-03-19 DIAGNOSIS — E785 Hyperlipidemia, unspecified: Secondary | ICD-10-CM

## 2022-03-19 DIAGNOSIS — I10 Essential (primary) hypertension: Secondary | ICD-10-CM

## 2022-03-19 DIAGNOSIS — F332 Major depressive disorder, recurrent severe without psychotic features: Secondary | ICD-10-CM | POA: Diagnosis not present

## 2022-03-19 DIAGNOSIS — F32A Depression, unspecified: Secondary | ICD-10-CM

## 2022-03-19 DIAGNOSIS — R5383 Other fatigue: Secondary | ICD-10-CM

## 2022-03-19 MED ORDER — LOSARTAN POTASSIUM 25 MG PO TABS: 25.0000 mg | ORAL_TABLET | Freq: Every day | ORAL | 1 refills | Status: DC

## 2022-03-19 NOTE — Patient Instructions (Addendum)
Medication Instructions:   Continue Losartan '25mg'$  daily.  *If you need a refill on your cardiac medications before your next appointment, please call your pharmacy*   Lab Work: Your physician recommends that you return for lab work today: CMP, direct LDL, CBC, thyroid panel  If you have labs (blood work) drawn today and your tests are completely normal, you will receive your results only by: MyChart Message (if you have MyChart) OR A paper copy in the mail If you have any lab test that is abnormal or we need to change your treatment, we will call you to review the results.   Testing/Procedures: None ordered today.   Follow-Up: At [facility], you and your health needs are our priority.  As part of our continuing mission to provide you with exceptional heart care, we have created designated Provider Care Teams.  These Care Teams include your primary Cardiologist (physician) and Advanced Practice Providers (APPs -  Physician Assistants and Nurse Practitioners) who all work together to provide you with the care you need, when you need it.  We recommend signing up for the patient portal called "MyChart".  Sign up information is provided on this After Visit Summary.  MyChart is used to connect with patients for Virtual Visits (Telemedicine).  Patients are able to view lab/test results, encounter notes, upcoming appointments, etc.  Non-urgent messages can be sent to your provider as well.   To learn more about what you can do with MyChart, go to [email or web address].    Your next appointment:   3 month(s)  The format for your next appointment:   In Person  Provider:   Glenetta Hew, MD or Advanced Practice Provider   Other Instructions  You have been referred to psychology today. Mental health is an important part of heart health. You have been referred to Dr. Elias Else. He is located at [facility] at [facility]. They will contact you to schedule an  appointment. If you have not heard from them in 1 week, you may call [phone] to schedule an appointment.

## 2022-03-20 LAB — THYROID PANEL WITH TSH
Free Thyroxine Index: 2.1 (ref 1.2–4.9)
T3 Uptake Ratio: 26 % (ref 24–39)
T4, Total: 8 ug/dL (ref 4.5–12.0)
TSH: 2.84 u[IU]/mL (ref 0.450–4.500)

## 2022-03-20 LAB — COMPREHENSIVE METABOLIC PANEL
ALT: 19 IU/L (ref 0–32)
AST: 26 IU/L (ref 0–40)
Albumin/Globulin Ratio: 2.3 — ABNORMAL HIGH (ref 1.2–2.2)
Albumin: 4.6 g/dL (ref 3.7–4.7)
Alkaline Phosphatase: 54 IU/L (ref 44–121)
BUN/Creatinine Ratio: 18 (ref 12–28)
BUN: 14 mg/dL (ref 8–27)
Bilirubin Total: 0.4 mg/dL (ref 0.0–1.2)
CO2: 23 mmol/L (ref 20–29)
Calcium: 9.6 mg/dL (ref 8.7–10.3)
Chloride: 98 mmol/L (ref 96–106)
Creatinine, Ser: 0.76 mg/dL (ref 0.57–1.00)
Globulin, Total: 2 g/dL (ref 1.5–4.5)
Glucose: 99 mg/dL (ref 70–99)
Potassium: 4.2 mmol/L (ref 3.5–5.2)
Sodium: 135 mmol/L (ref 134–144)
Total Protein: 6.6 g/dL (ref 6.0–8.5)
eGFR: 84 mL/min/{1.73_m2} (ref >59)

## 2022-03-20 LAB — CBC
Hematocrit: 39.7 % (ref 34.0–46.6)
Hemoglobin: 13.1 g/dL (ref 11.1–15.9)
MCH: 30.4 pg (ref 26.6–33.0)
MCHC: 33 g/dL (ref 31.5–35.7)
MCV: 92 fL (ref 79–97)
Platelets: 299 10*3/uL (ref 150–450)
RBC: 4.31 x10E6/uL (ref 3.77–5.28)
RDW: 12.6 % (ref 11.7–15.4)
WBC: 5.8 10*3/uL (ref 3.4–10.8)

## 2022-03-20 LAB — LDL CHOLESTEROL, DIRECT: LDL Direct: 69 mg/dL (ref 0–99)

## 2022-04-12 ENCOUNTER — Telehealth: Payer: Self-pay | Admitting: Family Medicine

## 2022-04-12 NOTE — Telephone Encounter (Signed)
Left message for patient to call back and schedule Medicare Annual Wellness Visit (AWV) either virtually or phone   Last AWV ;04/13/21   I left my direct # [phone]

## 2022-04-16 DIAGNOSIS — K589 Irritable bowel syndrome without diarrhea: Secondary | ICD-10-CM | POA: Diagnosis not present

## 2022-04-16 DIAGNOSIS — F329 Major depressive disorder, single episode, unspecified: Secondary | ICD-10-CM | POA: Diagnosis not present

## 2022-04-16 DIAGNOSIS — Z86010 Personal history of colonic polyps: Secondary | ICD-10-CM | POA: Diagnosis not present

## 2022-04-16 DIAGNOSIS — K219 Gastro-esophageal reflux disease without esophagitis: Secondary | ICD-10-CM | POA: Diagnosis not present

## 2022-04-17 ENCOUNTER — Ambulatory Visit (INDEPENDENT_AMBULATORY_CARE_PROVIDER_SITE_OTHER): Payer: Medicare PPO

## 2022-04-17 VITALS — Ht 64.5 in | Wt 156.0 lb

## 2022-04-17 DIAGNOSIS — Z0000 Encounter for general adult medical examination without abnormal findings: Secondary | ICD-10-CM

## 2022-04-17 NOTE — Progress Notes (Cosign Needed Addendum)
Subjective:   Shelby Vazquez is a 71 y.o. female who presents for Medicare Annual (Subsequent) preventive examination.  Review of Systems    Virtual Visit via Telephone Note  I connected with  Shelby Vazquez on 04/17/22 at  3:15 PM EDT by telephone and verified that I am speaking with the correct person using two identifiers.  Location: Patient: Home  Provider: Office Persons participating in the virtual visit: patient/Nurse Health Advisor   I discussed the limitations, risks, security and privacy concerns of performing an evaluation and management service by telephone and the availability of in person appointments. The patient expressed understanding and agreed to proceed.  Interactive audio and video telecommunications were attempted between this nurse and patient, however failed, due to patient having technical difficulties OR patient did not have access to video capability.  We continued and completed visit with audio only.  Some vital signs may be absent or patient reported.   Criselda Peaches, LPN  Cardiac Risk Factors include: advanced age (>35mn, >>58women);hypertension     Objective:    Today's Vitals   04/17/22 1510  Weight: 156 lb (70.8 kg)  Height: 5' 4.5" (1.638 m)   Body mass index is 26.36 kg/m.     04/17/2022    3:28 PM 04/13/2021    3:39 PM 03/24/2020   12:17 PM 02/24/2019   10:12 AM 07/16/2017    6:05 AM 01/15/2017    4:23 PM 03/24/2014    9:46 AM  Advanced Directives  Does Patient Have a Medical Advance Directive? No No No No No No Patient does not have advance directive;Patient would not like information  Does patient want to make changes to medical advance directive?  No - Patient declined       Would patient like information on creating a medical advance directive? No - Patient declined  No - Patient declined No - Patient declined No - Patient declined No - Patient declined     Current Medications (verified) Outpatient Encounter Medications as of  04/17/2022  Medication Sig   aspirin 81 MG tablet Take 1 tablet (81 mg total) by mouth daily.   Calcium Carbonate-Vitamin D 600-200 MG-UNIT CAPS Take 1 capsule by mouth daily.   DULoxetine (CYMBALTA) 60 MG capsule Take 1 capsule (60 mg total) by mouth daily.   famotidine (PEPCID) 40 MG tablet Take 1 tablet (40 mg total) by mouth at bedtime.   ibuprofen (ADVIL,MOTRIN) 200 MG tablet Take 600 mg by mouth every 6 (six) hours as needed for moderate pain.   loratadine (CLARITIN) 10 MG tablet Take 10 mg by mouth daily.   losartan (COZAAR) 25 MG tablet Take 1 tablet (25 mg total) by mouth daily.   methenamine (HIPREX) 1 g tablet Take 1 g by mouth 2 (two) times daily with a meal.    metoprolol tartrate (LOPRESSOR) 50 MG tablet TAKE 1 TABLET BY MOUTH TWICE DAILY. MAY TAKE AN EXTRA HALF TO 1 TABLET FOR PALPITATIONS AND/OR BLOOD PRESSURE   ondansetron (ZOFRAN-ODT) 4 MG disintegrating tablet DISSOLVE 1 TABLET(4 MG) ON THE TONGUE EVERY 8 HOURS AS NEEDED FOR NAUSEA OR VOMITING   Peppermint Oil (IBGARD PO)    polyethylene glycol powder (GLYCOLAX/MIRALAX) 17 GM/SCOOP powder Take 1 Container by mouth daily. As needed    Probiotic Product (PROBIOTIC PO) Take 1 capsule by mouth daily.   RABEprazole (ACIPHEX) 20 MG tablet Take 20 mg by mouth daily.   rosuvastatin (CRESTOR) 20 MG tablet Take 1 tablet (20 mg total)  by mouth every evening.   Vitamin B Complex-C CAPS    No facility-administered encounter medications on file as of 04/17/2022.    Allergies (verified) Augmentin [amoxicillin-pot clavulanate] and Erythromycin   History: Past Medical History:  Diagnosis Date   Abdominal aortic atherosclerosis ([place]) 10/2014   by CT scan   B12 deficiency    Chronic anxiety    Chronic cough    cyclical cough syndrome   Complication of anesthesia 9 yrs ago   awake and in severe pain with colonoscopy   Concussion    Diet-controlled diabetes mellitus ([place]) 02/22/2017   New dx 02/2017   GERD (gastroesophageal reflux  disease)    severe leading to chronic cough and hoarseness   History of Epstein-Barr virus infection 2012   Hyperlipidemia    Hypertension    IBS (irritable bowel syndrome)    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, PB-8 probiotic, healthy diet and exercise   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   By CT 09/2017 & 09/2018 - max diameter 4cm f/u annually    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   had surgery with 5 days of chemotherapy   Osteopenia 01/2015   T -0.8 spine, -1.7 hip   Pyelonephritis due to Escherichia coli 07/16/2017   Uterine prolapse    Vocal cord paralysis, bilateral partial 2001   evaluated at [facility] - did not require surgery   Past Surgical History:  Procedure Laterality Date   BUNIONECTOMY     [place] '2000   CATARACT EXTRACTION     right 01/2010, left may 2011   COLONOSCOPY  03/2018   diverticulosis, rpt 5 yrs ([name])   COLONOSCOPY WITH PROPOFOL N/A 03/31/2014   sessile serrated adenomas, sigmoid diverticulosis, rpt 3 yrs Ronald Lobo V, MD)   CORONARY CALCIUM SCORE-CORONARY CTA  09/2017   Calcium score 53 . Normal right dominant coronary artery system with mild onset of CAD. Aneurysmal dilation of the ascending aorta with maximum diameter diameter of 4.0-3.8 centimeter. -Recommend annual CT/MRA December 2018   DILATION AND CURETTAGE OF UTERUS  '77   for molar pregnancy excision   ESOPHAGOGASTRODUODENOSCOPY N/A 03/31/2014   mult gastric polyps, mild mucosal hemorrhages; ESOPHAGOGASTRODUODENOSCOPY (EGD);  Surgeon: Cleotis Nipper, MD   ESOPHAGOGASTRODUODENOSCOPY ENDOSCOPY  april 2014   INTERSTIM IMPLANT PLACEMENT  2018   toe surgery Right 09/2018   TRANSTHORACIC ECHOCARDIOGRAM  09/2017   Normal LV size and function. EF 55-60%. GR 1 DD. No regional wall motion abnormalities. No significant valvular abnormalities.   Family History  Problem Relation Age of Onset   Cancer Father        Esophagus   Heart disease Mother 19   Hyperlipidemia  Mother    Hypertension Mother    Heart attack Mother 53   Cancer Maternal Aunt        breast   Heart attack Maternal Grandmother    Heart attack Maternal Grandfather    Heart disease Paternal Grandmother    Heart disease Paternal Grandfather    Hyperlipidemia Brother    Hypertension Brother    Diabetes Neg Hx    Social History   Socioeconomic History   Marital status: Married    Spouse name: Not on file   Number of children: 2   Years of education: 16   Highest education level: Not on file  Occupational History   Occupation: Product manager: [employer]  Tobacco Use   Smoking status: Never   Smokeless tobacco:  Never  Vaping Use   Vaping Use: Never used  Substance and Sexual Activity   Alcohol use: Yes    Comment: twice a year   Drug use: No   Sexual activity: Yes    Partners: Male  Other Topics Concern   Not on file  Social History Narrative   Married 1973. 2 daughters '74, '80; 5 grandchildren.    No h/o physical or sexual abuse. 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    Edu: [employer].    Activity: stays active.   Diet: good water, fruits/vegetables daily   Social Determinants of Health   Financial Resource Strain: Low Risk  (04/17/2022)   Overall Financial Resource Strain (CARDIA)    Difficulty of Paying Living Expenses: Not hard at all  Food Insecurity: No Food Insecurity (04/17/2022)   Hunger Vital Sign    Worried About Running Out of Food in the Last Year: Never true    Ran Out of Food in the Last Year: Never true  Transportation Needs: No Transportation Needs (04/17/2022)   PRAPARE - Hydrologist (Medical): No    Lack of Transportation (Non-Medical): No  Physical Activity: Inactive (04/17/2022)   Exercise Vital Sign    Days of Exercise per Week: 0 days    Minutes of Exercise per Session: 0 min  Stress: Stress Concern Present (04/17/2022)   [place]    Feeling of Stress : To some extent  Social Connections: Moderately Isolated (04/17/2022)   Social Connection and Isolation Panel [NHANES]    Frequency of Communication with Friends and Family: More than three times a week    Frequency of Social Gatherings with Friends and Family: More than three times a week    Attends Religious Services: Never    Marine scientist or Organizations: No    Attends Archivist Meetings: Never    Marital Status: Married     Clinical Intake:  Pre-visit preparation completed: No  Diabetic?  Yes Pre Diabetic  Interpreter Needed?: NoActivities of Daily Living    04/17/2022    3:22 PM  In your present state of health, do you have any difficulty performing the following activities:  Hearing? 1  Comment Wears hearing aids  Vision? 0  Difficulty concentrating or making decisions? 1  Comment Family Assist  Walking or climbing stairs? 0  Dressing or bathing? 0  Doing errands, shopping? 0  Preparing Food and eating ? N  Using the Toilet? N  In the past six months, have you accidently leaked urine? N  Do you have problems with loss of bowel control? Y  Comment Dx IBS Followed by gastrologist  Managing your Medications? N  Managing your Finances? N  Housekeeping or managing your Housekeeping? N    Patient Care Team: Ria Bush, MD as PCP - General (Family Medicine) Leonie Man, MD as PCP - Cardiology (Cardiology) Dian Queen, MD (Obstetrics and Gynecology) Elsie Stain, MD as Attending Physician (Pulmonary Disease) Ronald Lobo, MD as Referring Physician (Gastroenterology) Jodi Marble, MD as Attending Physician (Otolaryngology)  Indicate any recent Medical Services you may have received from other than [facility] providers in the past year (date may be approximate).     Assessment:   This is a routine wellness examination for  Shelby Vazquez.  Hearing/Vision screen Hearing Screening - Comments:: Wears hearing aids Vision Screening -  Comments:: Wears contacts. Followed by Dr Gershon Crane  Dietary issues and exercise activities discussed: Exercise limited by: None identified   Goals Addressed               This Visit's Progress     Patient stated (pt-stated)        I would like to be better mental and physically better.       Depression Screen    04/17/2022    3:16 PM 04/17/2022    3:15 PM 02/20/2022   12:19 PM 12/24/2021   12:43 PM 11/23/2021   12:40 PM 04/13/2021    3:42 PM 03/24/2020   12:17 PM  PHQ 2/9 Scores  PHQ - 2 Score 2 0 '6 2 2 '$ 0 0  PHQ- 9 Score '9  26 11 10 '$ 0 0    Fall Risk    04/17/2022    3:26 PM 04/13/2021    3:41 PM 03/24/2020   12:17 PM 02/24/2019    9:51 AM 02/18/2018   11:38 AM  Fall Risk   Falls in the past year? 0 0 0 1 No  Comment    fall with injury to buttocks; seen in ED   Number falls in past yr: 0 0 0 0   Injury with Fall? 0 0 0 1   Risk for fall due to : No Fall Risks Medication side effect Medication side effect    Follow up  Falls evaluation completed;Falls prevention discussed Falls evaluation completed;Falls prevention discussed      FALL RISK PREVENTION PERTAINING TO THE HOME:  Any stairs in or around the home? Yes  If so, are there any without handrails? No  Home free of loose throw rugs in walkways, pet beds, electrical cords, etc? Yes  Adequate lighting in your home to reduce risk of falls? Yes   ASSISTIVE DEVICES UTILIZED TO PREVENT FALLS:  Life alert? No  Use of a cane, walker or w/c? No  Grab bars in the bathroom? No  Shower chair or bench in shower? No  Elevated toilet seat or a handicapped toilet? No   TIMED UP AND GO:  Was the test performed? No . Audio Visit  Cognitive Function:    04/13/2021    3:44 PM 03/24/2020   12:25 PM 02/24/2019   10:20 AM  MMSE - Mini Mental State Exam  Not completed: Refused Refused   Orientation to time   5  Orientation to Place    5  Registration   3  Attention/ Calculation   0  Recall   3  Language- name 2 objects   0  Language- repeat   1  Language- follow 3 step command   0  Language- read & follow direction   0  Write a sentence   0  Copy design   0  Total score   17        04/17/2022    3:28 PM  6CIT Screen  What Year? 0 points  What month? 0 points  What time? 0 points  Count back from 20 0 points  Months in reverse 0 points  Repeat phrase 0 points  Total Score 0 points    Immunizations Immunization History  Administered Date(s) Administered   Fluad Quad(high Dose 65+) 08/19/2019, 07/28/2020, 07/03/2021   Influenza Split 08/25/2009, 07/30/2010, 08/21/2012   Influenza Whole 07/25/2009, 08/29/2014   Influenza, High Dose Seasonal PF 09/12/2017, 08/20/2018   Influenza,inj,Quad PF,6+ Mos 07/23/2015, 07/23/2016   Influenza,inj,Quad PF,6-35 Mos 07/24/2013  PFIZER(Purple Top)SARS-COV-2 Vaccination 11/10/2019, 12/01/2019, 07/19/2020   Pneumococcal Conjugate-13 02/12/2016   Pneumococcal Polysaccharide-23 01/20/2013, 08/20/2018   Td 06/09/2010   Tdap 07/23/2015, 08/14/2016   Zoster Recombinat (Shingrix) 10/04/2020, 12/05/2020   Zoster, Live 07/30/2010, 04/28/2013    TDAP status: Up to date  Flu Vaccine status: Up to date  Pneumococcal vaccine status: Up to date  Covid-19 vaccine status: Information provided on how to obtain vaccines.   Qualifies for Shingles Vaccine? Yes   Zostavax completed Yes   Shingrix Completed?: Yes  Screening Tests Health Maintenance  Topic Date Due   FOOT EXAM  01/06/2020   HEMOGLOBIN A1C  10/17/2021   COVID-19 Vaccine (4 - Booster for Pfizer series) 05/03/2022 (Originally 09/13/2020)   INFLUENZA VACCINE  05/21/2022   MAMMOGRAM  10/01/2022   OPHTHALMOLOGY EXAM  11/28/2022   TETANUS/TDAP  08/14/2026   COLONOSCOPY (Pts 45-24yr Insurance coverage will need to be confirmed)  04/17/2028   Pneumonia Vaccine 71 Years old  Completed   DEXA SCAN  Completed    Hepatitis C Screening  Completed   Zoster Vaccines- Shingrix  Completed   HPV VACCINES  Aged Out    Health Maintenance  Health Maintenance Due  Topic Date Due   FOOT EXAM  01/06/2020   HEMOGLOBIN A1C  10/17/2021    Colorectal cancer screening: Type of screening: Colonoscopy. Completed 04/17/18. Repeat every 10 years  Mammogram status: Completed 10/01/21. Repeat every year  Bone Density status: Completed 05/17/21. Results reflect: Bone density results: OSTEOPENIA. Repeat every 5 years.  Lung Cancer Screening: (Low Dose CT Chest recommended if Age 71-80years, 30 pack-year currently smoking OR have quit w/in 15years.) does not qualify.     Additional Screening:  Hepatitis C Screening: does qualify; Completed 02/07/16  Vision Screening: Recommended annual ophthalmology exams for early detection of glaucoma and other disorders of the eye. Is the patient up to date with their annual eye exam?  Yes  Who is the provider or what is the name of the office in which the patient attends annual eye exams? Dr SGershon CraneIf pt is not established with a provider, would they like to be referred to a provider to establish care? No .   Dental Screening: Recommended annual dental exams for proper oral hygiene  Community Resource Referral / Chronic Care Management:   CRR required this visit?  No   CCM required this visit?  No      Plan:     I have personally reviewed and noted the following in the patient's chart:   Medical and social history Use of alcohol, tobacco or illicit drugs  Current medications and supplements including opioid prescriptions.  Functional ability and status Nutritional status Physical activity Advanced directives List of other physicians Hospitalizations, surgeries, and ER visits in previous 12 months Vitals Screenings to include cognitive, depression, and falls Referrals and appointments  In addition, I have reviewed and discussed with patient certain  preventive protocols, quality metrics, and best practice recommendations. A written personalized care plan for preventive services as well as general preventive health recommendations were provided to patient.     BCriselda Peaches LPN   [id number]  Nurse Notes: Patient currently pending counseling for depression. Patient refused any new referrals. Patient stated she has is not suicidal and has no plans or thoughts of hurting or harming herself.

## 2022-04-17 NOTE — Patient Instructions (Addendum)
Ms. Shelby Vazquez , Thank you for taking time to come for your Medicare Wellness Visit. I appreciate your ongoing commitment to your health goals. Please review the following plan we discussed and let me know if I can assist you in the future.   These are the goals we discussed:  Goals       Patient Stated      Starting 02/24/19, I will continue to take medications as prescribed.       Patient Stated      03/24/2020, I will continue to do yoga 1 night a week with my daughter for 1 hour.       Patient Stated      04/13/2021, I will continue to walk daily for about 15 minutes.       Patient stated (pt-stated)      I would like to be better mental and physically better.        This is a list of the screening recommended for you and due dates:  Health Maintenance  Topic Date Due   Complete foot exam   01/06/2020   Hemoglobin A1C  10/17/2021   COVID-19 Vaccine (4 - Booster for Pfizer series) 05/03/2022*   Flu Shot  05/21/2022   Mammogram  10/01/2022   Eye exam for diabetics  11/28/2022   Tetanus Vaccine  08/14/2026   Colon Cancer Screening  04/17/2028   Pneumonia Vaccine  Completed   DEXA scan (bone density measurement)  Completed   Hepatitis C Screening: USPSTF Recommendation to screen - Ages 87-79 yo.  Completed   Zoster (Shingles) Vaccine  Completed   HPV Vaccine  Aged Out  *Topic was postponed. The date shown is not the original due date.   Advanced directives: No   Conditions/risks identified: None  Next appointment: Follow up in one year for your annual wellness visit    Preventive Care 65 Years and Older, Female Preventive care refers to lifestyle choices and visits with your health care provider that can promote health and wellness. What does preventive care include? A yearly physical exam. This is also called an annual well check. Dental exams once or twice a year. Routine eye exams. Ask your health care provider how often you should have your eyes checked. Personal  lifestyle choices, including: Daily care of your teeth and gums. Regular physical activity. Eating a healthy diet. Avoiding tobacco and drug use. Limiting alcohol use. Practicing safe sex. Taking low-dose aspirin every day. Taking vitamin and mineral supplements as recommended by your health care provider. What happens during an annual well check? The services and screenings done by your health care provider during your annual well check will depend on your age, overall health, lifestyle risk factors, and family history of disease. Counseling  Your health care provider may ask you questions about your: Alcohol use. Tobacco use. Drug use. Emotional well-being. Home and relationship well-being. Sexual activity. Eating habits. History of falls. Memory and ability to understand (cognition). Work and work Statistician. Reproductive health. Screening  You may have the following tests or measurements: Height, weight, and BMI. Blood pressure. Lipid and cholesterol levels. These may be checked every 5 years, or more frequently if you are over 17 years old. Skin check. Lung cancer screening. You may have this screening every year starting at age 46 if you have a 30-pack-year history of smoking and currently smoke or have quit within the past 15 years. Fecal occult blood test (FOBT) of the stool. You may have this test every  year starting at age 38. Flexible sigmoidoscopy or colonoscopy. You may have a sigmoidoscopy every 5 years or a colonoscopy every 10 years starting at age 42. Hepatitis C blood test. Hepatitis B blood test. Sexually transmitted disease (STD) testing. Diabetes screening. This is done by checking your blood sugar (glucose) after you have not eaten for a while (fasting). You may have this done every 1-3 years. Bone density scan. This is done to screen for osteoporosis. You may have this done starting at age 4. Mammogram. This may be done every 1-2 years. Talk to your  health care provider about how often you should have regular mammograms. Talk with your health care provider about your test results, treatment options, and if necessary, the need for more tests. Vaccines  Your health care provider may recommend certain vaccines, such as: Influenza vaccine. This is recommended every year. Tetanus, diphtheria, and acellular pertussis (Tdap, Td) vaccine. You may need a Td booster every 10 years. Zoster vaccine. You may need this after age 33. Pneumococcal 13-valent conjugate (PCV13) vaccine. One dose is recommended after age 55. Pneumococcal polysaccharide (PPSV23) vaccine. One dose is recommended after age 36. Talk to your health care provider about which screenings and vaccines you need and how often you need them. This information is not intended to replace advice given to you by your health care provider. Make sure you discuss any questions you have with your health care provider. Document Released: 11/03/2015 Document Revised: 06/26/2016 Document Reviewed: 08/08/2015 Elsevier Interactive Patient Education  2017 [employer] Prevention in the Home Falls can cause injuries. They can happen to people of all ages. There are many things you can do to make your home safe and to help prevent falls. What can I do on the outside of my home? Regularly fix the edges of walkways and driveways and fix any cracks. Remove anything that might make you trip as you walk through a door, such as a raised step or threshold. Trim any bushes or trees on the path to your home. Use bright outdoor lighting. Clear any walking paths of anything that might make someone trip, such as rocks or tools. Regularly check to see if handrails are loose or broken. Make sure that both sides of any steps have handrails. Any raised decks and porches should have guardrails on the edges. Have any leaves, snow, or ice cleared regularly. Use sand or salt on walking paths during winter. Clean  up any spills in your garage right away. This includes oil or grease spills. What can I do in the bathroom? Use night lights. Install grab bars by the toilet and in the tub and shower. Do not use towel bars as grab bars. Use non-skid mats or decals in the tub or shower. If you need to sit down in the shower, use a plastic, non-slip stool. Keep the floor dry. Clean up any water that spills on the floor as soon as it happens. Remove soap buildup in the tub or shower regularly. Attach bath mats securely with double-sided non-slip rug tape. Do not have throw rugs and other things on the floor that can make you trip. What can I do in the bedroom? Use night lights. Make sure that you have a light by your bed that is easy to reach. Do not use any sheets or blankets that are too big for your bed. They should not hang down onto the floor. Have a firm chair that has side arms. You can use this  for support while you get dressed. Do not have throw rugs and other things on the floor that can make you trip. What can I do in the kitchen? Clean up any spills right away. Avoid walking on wet floors. Keep items that you use a lot in easy-to-reach places. If you need to reach something above you, use a strong step stool that has a grab bar. Keep electrical cords out of the way. Do not use floor polish or wax that makes floors slippery. If you must use wax, use non-skid floor wax. Do not have throw rugs and other things on the floor that can make you trip. What can I do with my stairs? Do not leave any items on the stairs. Make sure that there are handrails on both sides of the stairs and use them. Fix handrails that are broken or loose. Make sure that handrails are as long as the stairways. Check any carpeting to make sure that it is firmly attached to the stairs. Fix any carpet that is loose or worn. Avoid having throw rugs at the top or bottom of the stairs. If you do have throw rugs, attach them to the  floor with carpet tape. Make sure that you have a light switch at the top of the stairs and the bottom of the stairs. If you do not have them, ask someone to add them for you. What else can I do to help prevent falls? Wear shoes that: Do not have high heels. Have rubber bottoms. Are comfortable and fit you well. Are closed at the toe. Do not wear sandals. If you use a stepladder: Make sure that it is fully opened. Do not climb a closed stepladder. Make sure that both sides of the stepladder are locked into place. Ask someone to hold it for you, if possible. Clearly mark and make sure that you can see: Any grab bars or handrails. First and last steps. Where the edge of each step is. Use tools that help you move around (mobility aids) if they are needed. These include: Canes. Walkers. Scooters. Crutches. Turn on the lights when you go into a dark area. Replace any light bulbs as soon as they burn out. Set up your furniture so you have a clear path. Avoid moving your furniture around. If any of your floors are uneven, fix them. If there are any pets around you, be aware of where they are. Review your medicines with your doctor. Some medicines can make you feel dizzy. This can increase your chance of falling. Ask your doctor what other things that you can do to help prevent falls. This information is not intended to replace advice given to you by your health care provider. Make sure you discuss any questions you have with your health care provider. Document Released: 08/03/2009 Document Revised: 03/14/2016 Document Reviewed: 11/11/2014 Elsevier Interactive Patient Education  2017 [employer].

## 2022-06-15 DIAGNOSIS — R3915 Urgency of urination: Secondary | ICD-10-CM | POA: Insufficient documentation

## 2022-06-15 DIAGNOSIS — R350 Frequency of micturition: Secondary | ICD-10-CM | POA: Insufficient documentation

## 2022-06-15 DIAGNOSIS — Z960 Presence of urogenital implants: Secondary | ICD-10-CM | POA: Insufficient documentation

## 2022-06-18 DIAGNOSIS — K589 Irritable bowel syndrome without diarrhea: Secondary | ICD-10-CM | POA: Diagnosis not present

## 2022-06-18 DIAGNOSIS — R159 Full incontinence of feces: Secondary | ICD-10-CM | POA: Diagnosis not present

## 2022-06-19 ENCOUNTER — Ambulatory Visit (HOSPITAL_BASED_OUTPATIENT_CLINIC_OR_DEPARTMENT_OTHER): Payer: Medicare PPO | Admitting: Family

## 2022-07-01 ENCOUNTER — Telehealth: Payer: Self-pay | Admitting: Family Medicine

## 2022-07-01 DIAGNOSIS — E119 Type 2 diabetes mellitus without complications: Secondary | ICD-10-CM

## 2022-07-01 DIAGNOSIS — E785 Hyperlipidemia, unspecified: Secondary | ICD-10-CM

## 2022-07-01 DIAGNOSIS — E611 Iron deficiency: Secondary | ICD-10-CM

## 2022-07-01 DIAGNOSIS — E538 Deficiency of other specified B group vitamins: Secondary | ICD-10-CM

## 2022-07-01 DIAGNOSIS — M85851 Other specified disorders of bone density and structure, right thigh: Secondary | ICD-10-CM

## 2022-07-01 NOTE — Telephone Encounter (Signed)
Patient has been scheduled for her labs for her CPE. Just wanted to make aware because no order for labs in. Thank you!

## 2022-07-02 NOTE — Telephone Encounter (Signed)
Labs ordered.

## 2022-07-02 NOTE — Addendum Note (Signed)
Addended by: Ria Bush on: 07/02/2022 07:59 AM   Modules accepted: Orders

## 2022-07-03 ENCOUNTER — Other Ambulatory Visit (INDEPENDENT_AMBULATORY_CARE_PROVIDER_SITE_OTHER): Payer: Medicare PPO

## 2022-07-03 ENCOUNTER — Other Ambulatory Visit: Payer: Self-pay | Admitting: Family Medicine

## 2022-07-03 DIAGNOSIS — E538 Deficiency of other specified B group vitamins: Secondary | ICD-10-CM

## 2022-07-03 DIAGNOSIS — M85851 Other specified disorders of bone density and structure, right thigh: Secondary | ICD-10-CM

## 2022-07-03 DIAGNOSIS — E119 Type 2 diabetes mellitus without complications: Secondary | ICD-10-CM | POA: Diagnosis not present

## 2022-07-03 DIAGNOSIS — E611 Iron deficiency: Secondary | ICD-10-CM

## 2022-07-03 DIAGNOSIS — E785 Hyperlipidemia, unspecified: Secondary | ICD-10-CM

## 2022-07-03 DIAGNOSIS — D75839 Thrombocytosis, unspecified: Secondary | ICD-10-CM

## 2022-07-03 LAB — CBC WITH DIFFERENTIAL/PLATELET
Basophils Absolute: 0 10*3/uL (ref 0.0–0.1)
Basophils Relative: 0.6 % (ref 0.0–3.0)
Eosinophils Absolute: 0.1 10*3/uL (ref 0.0–0.7)
Eosinophils Relative: 2.5 % (ref 0.0–5.0)
HCT: 37.7 % (ref 36.0–46.0)
Hemoglobin: 12.7 g/dL (ref 12.0–15.0)
Lymphocytes Relative: 39 % (ref 12.0–46.0)
Lymphs Abs: 2.1 10*3/uL (ref 0.7–4.0)
MCHC: 33.6 g/dL (ref 30.0–36.0)
MCV: 91.5 fl (ref 78.0–100.0)
Monocytes Absolute: 0.3 10*3/uL (ref 0.1–1.0)
Monocytes Relative: 5.9 % (ref 3.0–12.0)
Neutro Abs: 2.8 10*3/uL (ref 1.4–7.7)
Neutrophils Relative %: 52 % (ref 43.0–77.0)
Platelets: 264 10*3/uL (ref 150.0–400.0)
RBC: 4.12 Mil/uL (ref 3.87–5.11)
RDW: 12.9 % (ref 11.5–15.5)
WBC: 5.4 10*3/uL (ref 4.0–10.5)

## 2022-07-03 LAB — IBC PANEL
Iron: 126 ug/dL (ref 42–145)
Saturation Ratios: 38 % (ref 20.0–50.0)
TIBC: 331.8 ug/dL (ref 250.0–450.0)
Transferrin: 237 mg/dL (ref 212.0–360.0)

## 2022-07-03 LAB — COMPREHENSIVE METABOLIC PANEL
ALT: 21 U/L (ref 0–35)
AST: 26 U/L (ref 0–37)
Albumin: 4 g/dL (ref 3.5–5.2)
Alkaline Phosphatase: 42 U/L (ref 39–117)
BUN: 15 mg/dL (ref 6–23)
CO2: 25 mEq/L (ref 19–32)
Calcium: 9.3 mg/dL (ref 8.4–10.5)
Chloride: 102 mEq/L (ref 96–112)
Creatinine, Ser: 0.86 mg/dL (ref 0.40–1.20)
GFR: 67.94 mL/min (ref >60.00)
Glucose, Bld: 95 mg/dL (ref 70–99)
Potassium: 4 mEq/L (ref 3.5–5.1)
Sodium: 136 mEq/L (ref 135–145)
Total Bilirubin: 0.5 mg/dL (ref 0.2–1.2)
Total Protein: 6.5 g/dL (ref 6.0–8.3)

## 2022-07-03 LAB — LIPID PANEL
Cholesterol: 125 mg/dL (ref 0–200)
HDL: 46.5 mg/dL (ref >39.00)
LDL Cholesterol: 53 mg/dL (ref 0–99)
NonHDL: 78.11
Total CHOL/HDL Ratio: 3
Triglycerides: 127 mg/dL (ref 0.0–149.0)
VLDL: 25.4 mg/dL (ref 0.0–40.0)

## 2022-07-03 LAB — VITAMIN B12: Vitamin B-12: 706 pg/mL (ref 211–911)

## 2022-07-03 LAB — MICROALBUMIN / CREATININE URINE RATIO
Creatinine,U: 71 mg/dL
Microalb Creat Ratio: 1 mg/g (ref 0.0–30.0)
Microalb, Ur: <0.7 mg/dL (ref 0.0–1.9)

## 2022-07-03 LAB — FERRITIN: Ferritin: 61.9 ng/mL (ref 10.0–291.0)

## 2022-07-03 LAB — HEMOGLOBIN A1C: Hgb A1c MFr Bld: 6.4 % (ref 4.6–6.5)

## 2022-07-03 LAB — VITAMIN D 25 HYDROXY (VIT D DEFICIENCY, FRACTURES): VITD: 63.37 ng/mL (ref 30.00–100.00)

## 2022-07-08 ENCOUNTER — Ambulatory Visit (INDEPENDENT_AMBULATORY_CARE_PROVIDER_SITE_OTHER): Payer: Medicare PPO | Admitting: Family Medicine

## 2022-07-08 ENCOUNTER — Encounter: Payer: Self-pay | Admitting: Family Medicine

## 2022-07-08 VITALS — BP 134/86 | HR 70 | Temp 97.9°F | Ht 63.75 in | Wt 157.2 lb

## 2022-07-08 DIAGNOSIS — Z7189 Other specified counseling: Secondary | ICD-10-CM | POA: Diagnosis not present

## 2022-07-08 DIAGNOSIS — K582 Mixed irritable bowel syndrome: Secondary | ICD-10-CM | POA: Diagnosis not present

## 2022-07-08 DIAGNOSIS — K219 Gastro-esophageal reflux disease without esophagitis: Secondary | ICD-10-CM | POA: Diagnosis not present

## 2022-07-08 DIAGNOSIS — I10 Essential (primary) hypertension: Secondary | ICD-10-CM

## 2022-07-08 DIAGNOSIS — Z8249 Family history of ischemic heart disease and other diseases of the circulatory system: Secondary | ICD-10-CM

## 2022-07-08 DIAGNOSIS — F331 Major depressive disorder, recurrent, moderate: Secondary | ICD-10-CM

## 2022-07-08 DIAGNOSIS — E538 Deficiency of other specified B group vitamins: Secondary | ICD-10-CM | POA: Diagnosis not present

## 2022-07-08 DIAGNOSIS — E785 Hyperlipidemia, unspecified: Secondary | ICD-10-CM | POA: Diagnosis not present

## 2022-07-08 DIAGNOSIS — Z0001 Encounter for general adult medical examination with abnormal findings: Secondary | ICD-10-CM

## 2022-07-08 DIAGNOSIS — I2510 Atherosclerotic heart disease of native coronary artery without angina pectoris: Secondary | ICD-10-CM

## 2022-07-08 DIAGNOSIS — E119 Type 2 diabetes mellitus without complications: Secondary | ICD-10-CM

## 2022-07-08 DIAGNOSIS — F411 Generalized anxiety disorder: Secondary | ICD-10-CM

## 2022-07-08 DIAGNOSIS — Z23 Encounter for immunization: Secondary | ICD-10-CM | POA: Diagnosis not present

## 2022-07-08 DIAGNOSIS — I700 Atherosclerosis of aorta: Secondary | ICD-10-CM

## 2022-07-08 DIAGNOSIS — M85851 Other specified disorders of bone density and structure, right thigh: Secondary | ICD-10-CM

## 2022-07-08 DIAGNOSIS — E611 Iron deficiency: Secondary | ICD-10-CM

## 2022-07-08 DIAGNOSIS — R339 Retention of urine, unspecified: Secondary | ICD-10-CM

## 2022-07-08 MED ORDER — ROSUVASTATIN CALCIUM 20 MG PO TABS: 20.0000 mg | ORAL_TABLET | Freq: Every evening | ORAL | 3 refills | Status: DC

## 2022-07-08 NOTE — Assessment & Plan Note (Signed)
Stable period on daily B complex vitamin.

## 2022-07-08 NOTE — Assessment & Plan Note (Signed)
Continue aspirin and statin. 

## 2022-07-08 NOTE — Assessment & Plan Note (Addendum)
Chronic, severe. Sees [facility] - pending establishing with new gatroenterologist after his retirement.  Currently on dicyclomine '20mg'$  TID scheduled with some benefit.  Continued predominant difficulty with bowel urgency and occasional accidents.

## 2022-07-08 NOTE — Assessment & Plan Note (Signed)
Preventative protocols reviewed and updated unless pt declined. Discussed healthy diet and lifestyle.  

## 2022-07-08 NOTE — Assessment & Plan Note (Signed)
Overall stable period off medication

## 2022-07-08 NOTE — Assessment & Plan Note (Signed)
Continue aspirin, statin.  

## 2022-07-08 NOTE — Assessment & Plan Note (Signed)
Chronic, stable. Continue current regimen. 

## 2022-07-08 NOTE — Assessment & Plan Note (Signed)
Continues aciphex + pepcid daily.

## 2022-07-08 NOTE — Assessment & Plan Note (Signed)
Chronic, stable. Latest A1c in prediabetes range. Encouraged watching added sugars and simple carbs.

## 2022-07-08 NOTE — Assessment & Plan Note (Addendum)
Regularly sees Dr Amalia Hailey urologist, has InterStim in place.

## 2022-07-08 NOTE — Assessment & Plan Note (Signed)
Followed by gyn with latest DEXA 04/2021 showing persistent osteopenia.

## 2022-07-08 NOTE — Assessment & Plan Note (Signed)
Advanced directives: does not have this set up. Packet previously provided - encouraged to continue working on this. HCPOA would be daughter Shelby Vazquez.

## 2022-07-08 NOTE — Assessment & Plan Note (Signed)
Chronic, stable. Continue crestor '20mg'$  daily. The ASCVD Risk score (Arnett DK, et al., 2019) failed to calculate for the following reasons:   The valid total cholesterol range is 130 to 320 mg/dL

## 2022-07-08 NOTE — Assessment & Plan Note (Signed)
Stable period off iron replacement  Last Feraheme infusion was 06/2020

## 2022-07-08 NOTE — Progress Notes (Signed)
Patient ID: [name], female    DOB: 1950-11-14, 71 y.o.   MRN: [id number]  This visit was conducted in person.  BP 134/86   Pulse 70   Temp 97.9 F (36.6 C) (Temporal)   Ht 5' 3.75" (1.619 m)   Wt 157 lb 4 oz (71.3 kg)   SpO2 98%   BMI 27.20 kg/m    CC: CPE Subjective:   HPI: Shelby Vazquez is a 71 y.o. female presenting on 07/08/2022 for Annual Exam [facility] prt 2. )   Saw health advisor 03/2022 for medicare wellness visit. Note reviewed.    No results found.  Flowsheet Row Clinical Support from 04/17/2022 in [facility] [place]  PHQ-2 Total Score 2          04/17/2022    3:26 PM 04/13/2021    3:41 PM 03/24/2020   12:17 PM 02/24/2019    9:51 AM 02/18/2018   11:38 AM  Fall Risk   Falls in the past year? 0 0 0 1 No  Comment    fall with injury to buttocks; seen in ED   Number falls in past yr: 0 0 0 0   Injury with Fall? 0 0 0 1   Risk for fall due to : No Fall Risks Medication side effect Medication side effect    Follow up  Falls evaluation completed;Falls prevention discussed Falls evaluation completed;Falls prevention discussed     Since last seen, went to [place] for 50th wedding anniversary - really enjoyed this trip! Just got back from weekend trip to [place].   IBS - doing well on dicyclomine '20mg'$  TID and benefiber once daily. Continues IBGard PRN, miralax PRN. Lactose free diet.   OAB, urinary retention, recurrent UTIs followed by urology Amalia Hailey). On Hiprex (methenamine) daily for UTI ppx. Also using InterStim.    Feraheme infusion x2 06/2020.  GERD - on aciphex (through GI) and pepcid '40mg'$  qhs.    MDD - slowly tapered off effexor, trial cymbalta worsened diarrhea so now off this as well. She decided not see psychiatry, psychology. Declines new referral.   Preventative: COLONOSCOPY WITH PROPOFOL Date: 03/31/2014 sessile serrated adenomas, sigmoid diverticulosis, rpt 3 yrs Cleotis Nipper, MD) COLONOSCOPY 03/2018 - diverticulosis, rpt 5 yrs  ([name])  ESOPHAGOGASTRODUODENOSCOPY Date: 03/31/2014 mult gastric polyps, mild mucosal hemorrhages but no microscopic colitis ([name]) Well woman with Dr. 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  Sees yearly in fall - normal paps  Mammogram 09/2021 Birads1 (with OBGYN)  DEXA 03/2019 osteopenia (with OBGYN)  DEXA 04/2021 - T -1.8 RFN  Lung cancer screening - not eligible  Flu shot yearly  [place] 10/2019, 11/2019, booster 06/2020  Pneumovax-23 01/2013, prevnar-13 01/2016  Td 2011, Tdap 2016, 07/2016  Zostavax 04/2013 shingrix discussed - 09/2020, 11/2020 Advanced directives: does not have this set up. Packet previously provided - encouraged to continue working on this. HCPOA would be daughter [name].  Seat belt use discussed  Sunscreen use discussed, no changing moles on skin  Non smoker Alcohol - none  Dentist - sees regularly Eye exam - yearly Bowel - no constipation, chronic IBS Bladder - occasional incontinence   Married 1973. 2 daughters '74, '80; 5 grandchildren.   No h/o physical or sexual abuse. 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   Edu: [employer]  Activity: enjoys hiking Diet: good water, fruits/vegetables daily     Relevant past  medical, surgical, family and social history reviewed and updated as indicated. Interim medical history since our last visit reviewed. Allergies and medications reviewed and updated. Outpatient Medications Prior to Visit  Medication Sig Dispense Refill  . aspirin 81 MG tablet Take 1 tablet (81 mg total) by mouth daily. 30 tablet   . Calcium Carbonate-Vitamin D 600-200 MG-UNIT CAPS Take 1 capsule by mouth daily.    Marland Kitchen dicyclomine (BENTYL) 20 MG tablet Take 20 mg by mouth 3 (three) times daily.    . Estradiol-Estriol-Progesterone (BIEST/PROGESTERONE TD) Place 2 mLs onto the skin in the morning and at bedtime. Apply to inner thighs or arms    .  famotidine (PEPCID) 40 MG tablet Take 1 tablet (40 mg total) by mouth at bedtime. 90 tablet 3  . ibuprofen (ADVIL,MOTRIN) 200 MG tablet Take 600 mg by mouth every 6 (six) hours as needed for moderate pain.    Marland Kitchen loratadine (CLARITIN) 10 MG tablet Take 10 mg by mouth daily.    Marland Kitchen losartan (COZAAR) 25 MG tablet Take 1 tablet (25 mg total) by mouth daily. 90 tablet 1  . methenamine (HIPREX) 1 g tablet Take 1 g by mouth 2 (two) times daily with a meal.   1  . metoprolol tartrate (LOPRESSOR) 50 MG tablet TAKE 1 TABLET BY MOUTH TWICE DAILY. MAY TAKE AN EXTRA HALF TO 1 TABLET FOR PALPITATIONS AND/OR BLOOD PRESSURE 270 tablet 3  . ondansetron (ZOFRAN-ODT) 4 MG disintegrating tablet DISSOLVE 1 TABLET(4 MG) ON THE TONGUE EVERY 8 HOURS AS NEEDED FOR NAUSEA OR VOMITING 20 tablet 0  . Probiotic Product (PROBIOTIC PO) Take by mouth daily. 900 billion    . RABEprazole (ACIPHEX) 20 MG tablet Take 20 mg by mouth daily.    . Vitamin B Complex-C CAPS     . Wheat Dextrin (BENEFIBER DRINK MIX PO) Take by mouth daily. Takes 1-2 teaspoons daily    . rosuvastatin (CRESTOR) 20 MG tablet Take 1 tablet (20 mg total) by mouth every evening. 90 tablet 3  . DULoxetine (CYMBALTA) 60 MG capsule Take 1 capsule (60 mg total) by mouth daily. 30 capsule 6  . Peppermint Oil (IBGARD PO)     . polyethylene glycol powder (GLYCOLAX/MIRALAX) 17 GM/SCOOP powder Take 1 Container by mouth daily. As needed     . Probiotic Product (PROBIOTIC PO) Take 1 capsule by mouth daily.     No facility-administered medications prior to visit.     Per HPI unless specifically indicated in ROS section below Review of Systems  Constitutional:  Negative for activity change, appetite change, chills, fatigue, fever and unexpected weight change.  HENT:  Negative for hearing loss.   Eyes:  Negative for visual disturbance.  Respiratory:  Positive for cough (chronic ?PNDrainage). Negative for chest tightness, shortness of breath and wheezing.   Cardiovascular:   Negative for chest pain, palpitations and leg swelling.  Gastrointestinal:  Positive for abdominal pain, diarrhea (chronic) and nausea. Negative for abdominal distention, blood in stool, constipation and vomiting.  Genitourinary:  Negative for difficulty urinating and hematuria.  Musculoskeletal:  Negative for arthralgias, myalgias and neck pain.  Skin:  Negative for rash.  Neurological:  Negative for dizziness, seizures, syncope and headaches.  Hematological:  Negative for adenopathy. Does not bruise/bleed easily.  Psychiatric/Behavioral:  Negative for dysphoric mood. The patient is not nervous/anxious.     Objective:  BP 134/86   Pulse 70   Temp 97.9 F (36.6 C) (Temporal)   Ht 5' 3.75" (1.619 m)  Wt 157 lb 4 oz (71.3 kg)   SpO2 98%   BMI 27.20 kg/m   Wt Readings from Last 3 Encounters:  07/08/22 157 lb 4 oz (71.3 kg)  04/17/22 156 lb (70.8 kg)  03/19/22 156 lb (70.8 kg)      Physical Exam Vitals and nursing note reviewed.  Constitutional:      Appearance: Normal appearance. She is not ill-appearing.  HENT:     Head: Normocephalic and atraumatic.     Right Ear: Tympanic membrane, ear canal and external ear normal. There is no impacted cerumen.     Left Ear: Tympanic membrane, ear canal and external ear normal. There is no impacted cerumen.     Ears:     Comments: Wears hearing aides Eyes:     General:        Right eye: No discharge.        Left eye: No discharge.     Extraocular Movements: Extraocular movements intact.     Conjunctiva/sclera: Conjunctivae normal.     Pupils: Pupils are equal, round, and reactive to light.  Neck:     Thyroid: No thyroid mass or thyromegaly.     Vascular: No carotid bruit.  Cardiovascular:     Rate and Rhythm: Normal rate and regular rhythm.     Pulses: Normal pulses.     Heart sounds: Normal heart sounds. No murmur heard. Pulmonary:     Effort: Pulmonary effort is normal. No respiratory distress.     Breath sounds: Normal breath  sounds. No wheezing, rhonchi or rales.  Abdominal:     General: Bowel sounds are normal. There is no distension.     Palpations: Abdomen is soft. There is no mass.     Tenderness: There is no abdominal tenderness. There is no guarding or rebound.     Hernia: No hernia is present.  Musculoskeletal:     Cervical back: Normal range of motion and neck supple. No rigidity.     Right lower leg: No edema.     Left lower leg: No edema.  Lymphadenopathy:     Cervical: No cervical adenopathy.  Skin:    General: Skin is warm and dry.     Findings: No rash.  Neurological:     General: No focal deficit present.     Mental Status: She is alert. Mental status is at baseline.  Psychiatric:        Mood and Affect: Mood normal.        Behavior: Behavior normal.      Results for orders placed or performed in visit on 07/03/22  CBC with Differential/Platelet  Result Value Ref Range   WBC 5.4 4.0 - 10.5 K/uL   RBC 4.12 3.87 - 5.11 Mil/uL   Hemoglobin 12.7 12.0 - 15.0 g/dL   HCT 37.7 36.0 - 46.0 %   MCV 91.5 78.0 - 100.0 fl   MCHC 33.6 30.0 - 36.0 g/dL   RDW 12.9 11.5 - 15.5 %   Platelets 264.0 150.0 - 400.0 K/uL   Neutrophils Relative % 52.0 43.0 - 77.0 %   Lymphocytes Relative 39.0 12.0 - 46.0 %   Monocytes Relative 5.9 3.0 - 12.0 %   Eosinophils Relative 2.5 0.0 - 5.0 %   Basophils Relative 0.6 0.0 - 3.0 %   Neutro Abs 2.8 1.4 - 7.7 K/uL   Lymphs Abs 2.1 0.7 - 4.0 K/uL   Monocytes Absolute 0.3 0.1 - 1.0 K/uL   Eosinophils Absolute 0.1 0.0 -  0.7 K/uL   Basophils Absolute 0.0 0.0 - 0.1 K/uL  Microalbumin / creatinine urine ratio  Result Value Ref Range   Microalb, Ur <0.7 0.0 - 1.9 mg/dL   Creatinine,U 71.0 mg/dL   Microalb Creat Ratio 1.0 0.0 - 30.0 mg/g  Hemoglobin A1c  Result Value Ref Range   Hgb A1c MFr Bld 6.4 4.6 - 6.5 %  Comprehensive metabolic panel  Result Value Ref Range   Sodium 136 135 - 145 mEq/L   Potassium 4.0 3.5 - 5.1 mEq/L   Chloride 102 96 - 112 mEq/L   CO2 25  19 - 32 mEq/L   Glucose, Bld 95 70 - 99 mg/dL   BUN 15 6 - 23 mg/dL   Creatinine, Ser 0.86 0.40 - 1.20 mg/dL   Total Bilirubin 0.5 0.2 - 1.2 mg/dL   Alkaline Phosphatase 42 39 - 117 U/L   AST 26 0 - 37 U/L   ALT 21 0 - 35 U/L   Total Protein 6.5 6.0 - 8.3 g/dL   Albumin 4.0 3.5 - 5.2 g/dL   GFR 67.94 >60.00 mL/min   Calcium 9.3 8.4 - 10.5 mg/dL  Lipid panel  Result Value Ref Range   Cholesterol 125 0 - 200 mg/dL   Triglycerides 127.0 0.0 - 149.0 mg/dL   HDL 46.50 >39.00 mg/dL   VLDL 25.4 0.0 - 40.0 mg/dL   LDL Cholesterol 53 0 - 99 mg/dL   Total CHOL/HDL Ratio 3    NonHDL 78.11   VITAMIN D 25 Hydroxy (Vit-D Deficiency, Fractures)  Result Value Ref Range   VITD 63.37 30.00 - 100.00 ng/mL  IBC panel  Result Value Ref Range   Iron 126 42 - 145 ug/dL   Transferrin 237.0 212.0 - 360.0 mg/dL   Saturation Ratios 38.0 20.0 - 50.0 %   TIBC 331.8 250.0 - 450.0 mcg/dL  Ferritin  Result Value Ref Range   Ferritin 61.9 10.0 - 291.0 ng/mL  Vitamin B12  Result Value Ref Range   Vitamin B-12 706 211 - 911 pg/mL    Assessment & Plan:   Problem List Items Addressed This Visit     Encounter for general adult medical examination with abnormal findings - Primary (Chronic)    Preventative protocols reviewed and updated unless pt declined. Discussed healthy diet and lifestyle.       Advanced care planning/counseling discussion (Chronic)    Advanced directives: does not have this set up. Packet previously provided - encouraged to continue working on this. HCPOA would be daughter [name].       Vitamin B12 deficiency    Stable period on daily B complex vitamin.       Hyperlipidemia with target LDL less than 100    Chronic, stable. Continue crestor '20mg'$  daily. The ASCVD Risk score (Arnett DK, et al., 2019) failed to calculate for the following reasons:   The valid total cholesterol range is 130 to 320 mg/dL       Relevant Medications   rosuvastatin (CRESTOR) 20 MG tablet    GAD (generalized anxiety disorder)    Overall stable period off medication      Essential hypertension    Chronic, stable. Continue current regimen.       Relevant Medications   rosuvastatin (CRESTOR) 20 MG tablet   GERD, SEVERE    Continues aciphex + pepcid daily.       Relevant Medications   dicyclomine (BENTYL) 20 MG tablet   Probiotic Product (PROBIOTIC PO)   Wheat  Dextrin (BENEFIBER DRINK MIX PO)   Irritable bowel syndrome (IBS)    Chronic, severe. Sees [facility] - pending establishing with new gatroenterologist after his retirement.  Currently on dicyclomine '20mg'$  TID scheduled with some benefit.  Continued predominant difficulty with bowel urgency and occasional accidents.       Relevant Medications   dicyclomine (BENTYL) 20 MG tablet   Probiotic Product (PROBIOTIC PO)   Wheat Dextrin (BENEFIBER DRINK MIX PO)   Osteopenia    Followed by gyn with latest DEXA 04/2021 showing persistent osteopenia.       MDD (major depressive disorder), recurrent episode, moderate (HCC)    Chronic, difficulty tolerating medication (cymbata) or receiving therapeutic effect (celexa). Currently off all antidepressants and feels she's doing well.       Abdominal aortic atherosclerosis (HCC)    Continue aspirin and statin.       Relevant Medications   rosuvastatin (CRESTOR) 20 MG tablet   Iron deficiency    Stable period off iron replacement  Last Feraheme infusion was 06/2020      Urinary retention with incomplete bladder emptying    Regularly sees Dr Amalia Hailey urologist, has InterStim in place.       Diet-controlled diabetes mellitus (HCC)    Chronic, stable. Latest A1c in prediabetes range. Encouraged watching added sugars and simple carbs.       Relevant Medications   rosuvastatin (CRESTOR) 20 MG tablet   Family history of premature coronary artery disease    Regularly sees cardiology, on aspirin/statin.       Coronary artery disease, non-occlusive - Cor CA Score 53.  MIldCAD  only.    Continue aspirin, statin.       Relevant Medications   rosuvastatin (CRESTOR) 20 MG tablet   Other Visit Diagnoses     Need for influenza vaccination       Relevant Orders   Flu Vaccine QUAD High Dose(Fluad) (Completed)        Meds ordered this encounter  Medications  . rosuvastatin (CRESTOR) 20 MG tablet    Sig: Take 1 tablet (20 mg total) by mouth every evening.    Dispense:  90 tablet    Refill:  3   Orders Placed This Encounter  Procedures  . Flu Vaccine QUAD High Dose(Fluad)     Patient instructions: Flu shot today  Work on setting up advanced directive.  You are doing well today Return as needed or in 4-6 months for follow up visit   Follow up plan: Return in about 4 months (around 11/07/2022) for follow up visit.  Ria Bush, MD

## 2022-07-08 NOTE — Patient Instructions (Addendum)
Flu shot today  Work on setting up advanced directive.  You are doing well today Return as needed or in 4-6 months for follow up visit   Health Maintenance After Age 71 After age 53, you are at a higher risk for certain long-term diseases and infections as well as injuries from falls. Falls are a major cause of broken bones and head injuries in people who are older than age 52. Getting regular preventive care can help to keep you healthy and well. Preventive care includes getting regular testing and making lifestyle changes as recommended by your health care provider. Talk with your health care provider about: Which screenings and tests you should have. A screening is a test that checks for a disease when you have no symptoms. A diet and exercise plan that is right for you. What should I know about screenings and tests to prevent falls? Screening and testing are the best ways to find a health problem early. Early diagnosis and treatment give you the best chance of managing medical conditions that are common after age 69. Certain conditions and lifestyle choices may make you more likely to have a fall. Your health care provider may recommend: Regular vision checks. Poor vision and conditions such as cataracts can make you more likely to have a fall. If you wear glasses, make sure to get your prescription updated if your vision changes. Medicine review. Work with your health care provider to regularly review all of the medicines you are taking, including over-the-counter medicines. Ask your health care provider about any side effects that may make you more likely to have a fall. Tell your health care provider if any medicines that you take make you feel dizzy or sleepy. Strength and balance checks. Your health care provider may recommend certain tests to check your strength and balance while standing, walking, or changing positions. Foot health exam. Foot pain and numbness, as well as not wearing proper  footwear, can make you more likely to have a fall. Screenings, including: Osteoporosis screening. Osteoporosis is a condition that causes the bones to get weaker and break more easily. Blood pressure screening. Blood pressure changes and medicines to control blood pressure can make you feel dizzy. Depression screening. You may be more likely to have a fall if you have a fear of falling, feel depressed, or feel unable to do activities that you used to do. Alcohol use screening. Using too much alcohol can affect your balance and may make you more likely to have a fall. Follow these instructions at home: Lifestyle Do not drink alcohol if: Your health care provider tells you not to drink. If you drink alcohol: Limit how much you have to: 0-1 drink a day for women. 0-2 drinks a day for men. Know how much alcohol is in your drink. In the [place] one drink equals one 12 oz bottle of beer (355 mL), one 5 oz glass of wine (148 mL), or one 1 oz glass of hard liquor (44 mL). Do not use any products that contain nicotine or tobacco. These products include cigarettes, chewing tobacco, and vaping devices, such as e-cigarettes. If you need help quitting, ask your health care provider. Activity  Follow a regular exercise program to stay fit. This will help you maintain your balance. Ask your health care provider what types of exercise are appropriate for you. If you need a cane or walker, use it as recommended by your health care provider. Wear supportive shoes that have nonskid soles. Safety  Remove any tripping hazards, such as rugs, cords, and clutter. Install safety equipment such as grab bars in bathrooms and safety rails on stairs. Keep rooms and walkways well-lit. General instructions Talk with your health care provider about your risks for falling. Tell your health care provider if: You fall. Be sure to tell your health care provider about all falls, even ones that seem minor. You feel dizzy,  tiredness (fatigue), or off-balance. Take over-the-counter and prescription medicines only as told by your health care provider. These include supplements. Eat a healthy diet and maintain a healthy weight. A healthy diet includes low-fat dairy products, low-fat (lean) meats, and fiber from whole grains, beans, and lots of fruits and vegetables. Stay current with your vaccines. Schedule regular health, dental, and eye exams. Summary Having a healthy lifestyle and getting preventive care can help to protect your health and wellness after age 80. Screening and testing are the best way to find a health problem early and help you avoid having a fall. Early diagnosis and treatment give you the best chance for managing medical conditions that are more common for people who are older than age 74. Falls are a major cause of broken bones and head injuries in people who are older than age 14. Take precautions to prevent a fall at home. Work with your health care provider to learn what changes you can make to improve your health and wellness and to prevent falls. This information is not intended to replace advice given to you by your health care provider. Make sure you discuss any questions you have with your health care provider. Document Revised: 02/26/2021 Document Reviewed: 02/26/2021 Elsevier Patient Education  [place].

## 2022-07-08 NOTE — Assessment & Plan Note (Signed)
Regularly sees cardiology, on aspirin/statin.

## 2022-07-08 NOTE — Assessment & Plan Note (Addendum)
Chronic, difficulty tolerating medication (cymbata) or receiving therapeutic effect (celexa). Currently off all antidepressants and feels she's doing well.

## 2022-07-09 ENCOUNTER — Ambulatory Visit (HOSPITAL_BASED_OUTPATIENT_CLINIC_OR_DEPARTMENT_OTHER): Payer: Medicare PPO | Admitting: Family

## 2022-07-09 ENCOUNTER — Encounter (HOSPITAL_BASED_OUTPATIENT_CLINIC_OR_DEPARTMENT_OTHER): Payer: Self-pay | Admitting: Family

## 2022-07-09 VITALS — BP 146/100 | HR 71 | Ht 63.75 in | Wt 159.0 lb

## 2022-07-09 DIAGNOSIS — R002 Palpitations: Secondary | ICD-10-CM | POA: Diagnosis not present

## 2022-07-09 DIAGNOSIS — E785 Hyperlipidemia, unspecified: Secondary | ICD-10-CM

## 2022-07-09 DIAGNOSIS — I10 Essential (primary) hypertension: Secondary | ICD-10-CM | POA: Diagnosis not present

## 2022-07-09 DIAGNOSIS — I25118 Atherosclerotic heart disease of native coronary artery with other forms of angina pectoris: Secondary | ICD-10-CM

## 2022-07-09 NOTE — Patient Instructions (Signed)
Medication Instructions:  None ordered today.   We may change your Losartan if your blood pressure is consistently more than 130/80.   *If you need a refill on your cardiac medications before your next appointment, please call your pharmacy*  Lab Work: None ordered today.   Testing/Procedures: None ordered today.   Follow-Up: At [facility], you and your health needs are our priority.  As part of our continuing mission to provide you with exceptional heart care, we have created designated Provider Care Teams.  These Care Teams include your primary Cardiologist (physician) and Advanced Practice Providers (APPs -  Physician Assistants and Nurse Practitioners) who all work together to provide you with the care you need, when you need it.  We recommend signing up for the patient portal called "MyChart".  Sign up information is provided on this After Visit Summary.  MyChart is used to connect with patients for Virtual Visits (Telemedicine).  Patients are able to view lab/test results, encounter notes, upcoming appointments, etc.  Non-urgent messages can be sent to your provider as well.   To learn more about what you can do with MyChart, go to [email or web address].    Your next appointment:   6 month(s)  The format for your next appointment:   In Person  Provider:   Glenetta Hew, MD  or Loel Dubonnet, NP    Other Instructions Tips to Measure your Blood Pressure Correctly  Here's what you can do to ensure a correct reading:  Don't drink a caffeinated beverage or smoke during the 30 minutes before the test.  Sit quietly for five minutes before the test begins.  During the measurement, sit in a chair with your feet on the floor and your arm supported so your elbow is at about heart level.  The inflatable part of the cuff should completely cover at least 80% of your upper arm, and the cuff should be placed on bare skin, not over a shirt.  Don't talk during the  measurement.  Blood pressure categories  Blood pressure category SYSTOLIC (upper number)  DIASTOLIC (lower number)  Normal Less than 120 mm Hg and Less than 80 mm Hg  Elevated 120-129 mm Hg and Less than 80 mm Hg  High blood pressure: Stage 1 hypertension 130-139 mm Hg or 80-89 mm Hg  High blood pressure: Stage 2 hypertension 140 mm Hg or higher or 90 mm Hg or higher  Hypertensive crisis (consult your doctor immediately) Higher than 180 mm Hg and/or Higher than 120 mm Hg  Source: American Heart Association and American Stroke Association. For more on getting your blood pressure under control, buy Controlling Your Blood Pressure, a Special Health Report from [facility].   Blood Pressure Log   Date   Time  Blood Pressure  Example: Nov 1 9 AM 124/78

## 2022-07-09 NOTE — Progress Notes (Signed)
Office Visit    Patient Name: Shelby Vazquez Date of Encounter: 07/09/2022  PCP:  Ria Bush, [facility]  Cardiologist:  Glenetta Hew, MD  Advanced Practice Provider:  No care team member to display Electrophysiologist:  None      Chief Complaint    Shelby Vazquez is a 71 y.o. female with a hx of hypertension, aortic atherosclerosis, hyperlipidemia, palpitations, coronary artery disease (coronary CTA with nonobstructive CAD), TIA, iron deficiency anemia, GERD, MDD  presents today for hypertension follow up.  Past Medical History    Past Medical History:  Diagnosis Date   Abdominal aortic atherosclerosis ([place]) 10/2014   by CT scan   B12 deficiency    Chronic anxiety    Chronic cough    cyclical cough syndrome   Complication of anesthesia 9 yrs ago   awake and in severe pain with colonoscopy   Concussion    Diet-controlled diabetes mellitus ([place]) 02/22/2017   New dx 02/2017   GERD (gastroesophageal reflux disease)    severe leading to chronic cough and hoarseness   History of Epstein-Barr virus infection 2012   Hyperlipidemia    Hypertension    IBS (irritable bowel syndrome)    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, PB-8 probiotic, healthy diet and exercise   Mild ascending aorta dilation ([place]) 11/02/2017   By CT 09/2017 & 09/2018 - max diameter 4cm f/u annually    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   had surgery with 5 days of chemotherapy   Osteopenia 01/2015   T -0.8 spine, -1.7 hip   Pyelonephritis due to Escherichia coli 07/16/2017   Uterine prolapse    Vocal cord paralysis, bilateral partial 2001   evaluated at [facility] - did not require surgery   Past Surgical History:  Procedure Laterality Date   BUNIONECTOMY     [place] '2000   CATARACT EXTRACTION     right 01/2010, left may 2011   COLONOSCOPY  03/2018   diverticulosis, rpt 5 yrs ([name])   COLONOSCOPY WITH PROPOFOL N/A 03/31/2014   sessile serrated adenomas,  sigmoid diverticulosis, rpt 3 yrs Ronald Lobo V, MD)   CORONARY CALCIUM SCORE-CORONARY CTA  09/2017   Calcium score 53 . Normal right dominant coronary artery system with mild onset of CAD. Aneurysmal dilation of the ascending aorta with maximum diameter diameter of 4.0-3.8 centimeter. -Recommend annual CT/MRA December 2018   DILATION AND CURETTAGE OF UTERUS  '77   for molar pregnancy excision   ESOPHAGOGASTRODUODENOSCOPY N/A 03/31/2014   mult gastric polyps, mild mucosal hemorrhages; ESOPHAGOGASTRODUODENOSCOPY (EGD);  Surgeon: Cleotis Nipper, MD   ESOPHAGOGASTRODUODENOSCOPY ENDOSCOPY  april 2014   INTERSTIM IMPLANT PLACEMENT  2018   toe surgery Right 09/2018   TRANSTHORACIC ECHOCARDIOGRAM  09/2017   Normal LV size and function. EF 55-60%. GR 1 DD. No regional wall motion abnormalities. No significant valvular abnormalities.    Allergies  Allergies  Allergen Reactions   Augmentin [Amoxicillin-Pot Clavulanate] Other (See Comments)    Severe diarrhea, rash, worsened acid reflux   Erythromycin Rash    History of Present Illness    Shelby Vazquez is a 71 y.o. female with a hx of hypertension, aortic atherosclerosis, hyperlipidemia, palpitations, coronary artery disease (coronary CTA with nonobstructive CAD), TIA, iron deficiency anemia, GERD, MDD last seen 03/19/22.  Prior cardiac work-up includes echocardiogram 09/2017 revealing normal LVEF 55 to 90%, grade 1 diastolic dysfunction, mild dilation of ascending aorta 42 mm, trivial MR.  She underwent cardiac CTA 09/2017 revealing coronary calcium score of 53 placing her in the 72nd percentile for age and sex pressure control.  She had mild nonobstructive coronary disease (prox LAD 25-50%, LCx 0-25%). Ascending aorta measured 40x51m.   She was seen 12/20/2021 for preop clearance for removal of deep implant right foot by Ortho.  She had an episode of chest discomfort earlier that week while sitting and watching television.  BP at home had  been [id number].  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 Chest pain thought to be non cardiac, clearance for surgery provided, and started on low dose Losartan 12.'5mg'$  QD. At follow up 12/24/21 with her PPC dose was increased to '25mg'$  QD.   Seen 03/19/22. Noted stressor of her sister in law passing away recently and offered condolences. Was following with primary care regarding depression medications and had referral to psychiatry. Noted fatigue - CMP, CBC, thyroid panel updated which were unremarkable.  07/03/22 LDL 53.  She presents today for follow-up. Enjoys spending time with her grandchildren.  She works out at [employer]  [place] her [place] and grandchildren. Feels her mental state and depression are much improved. Reports no shortness of breath nor dyspnea on exertion. Reports no chest pain, pressure, or tightness. No edema, orthopnea, PND. Reports no palpitations.  Does note IBS flare which she believes is causing her elevated BP.   EKGs/Labs/Other Studies Reviewed:   The following studies were reviewed today:  Echo 09/2017 Left ventricle: The cavity size was normal. Wall thickness was    normal. Systolic function was normal. The estimated ejection    fraction was in the range of 55% to 60%. Wall motion was normal;    there were no regional wall motion abnormalities. Doppler    parameters are consistent with abnormal left ventricular    relaxation (grade 1 diastolic dysfunction).  - Aortic valve: There was no stenosis.  - Aorta: Ascending aorta dimension: 42 mm.  - Ascending aorta: The ascending aorta was mildly dilated.  - Mitral valve: There was trivial regurgitation.  - Right ventricle: The cavity size was normal. Systolic function    was normal.  -  Tricuspid valve: Peak RV-RA gradient (S): 19 mm Hg.  - Pulmonary arteries: PA peak pressure: 27 mm Hg (S).  - Systemic veins: IVC measured 1.8 cm with < 50% respirophasic    variation, suggesting RA pressure 8 mmHg.   Impressions:   - Normal LV size with EF 55-60%. Normal RV size and systolic    function. No significant valvular abnormalities.   Cardiac CTA 09/2017 FINDINGS: A 120 kV prospective scan was triggered in the descending thoracic aorta at 111 HU's. Axial non-contrast 3 mm slices were carried out through the heart. The data set was analyzed on a dedicated work station and scored using the [place] G[place] rotation speed was 250 msecs and collimation was .6 mm. 5 mg of iv Metoprolol and 0.8 mg of sl NTG was given. The 3D data set was reconstructed in 5% intervals of the 67-82 % of the R-R cycle. Diastolic phases were analyzed on a dedicated work station using MPR, MIP and VRT modes. The patient received 80 cc of contrast.   Aorta: Aneurysmal dilatation of the ascending aorta with maximum diameter 40 x 38 mm. No  calcifications. No dissection.   Aortic Valve:  Trileaflet.  No calcifications.   Coronary Arteries:  Normal coronary origin.  Right dominance.   RCA is a large dominant artery that gives rise to PDA and PLA. There is trivial plaque in the proximal RCA.   Left main is a short artery that gives rise to LAD and LCX arteries.   LAD is a large and long vessel that gives rise to one diagonal artery and wraps around the apex. There is mild calcified plaque in the proximal LAD with associated stenosis 25-50%   LCX is a non-dominant artery that gives rise to one OM1 branch. There is minimal calcified plaque in the proximal LCX artery with associated stenosis 0-25%.   Other findings:   Normal pulmonary vein drainage into the left atrium.   Normal let atrial appendage without a thrombus.   Normal size of the pulmonary artery.   IMPRESSION: 1. Coronary  calcium score of 53. This was 44 percentile for age and sex matched control.   2. Normal coronary origin with right dominance.   3. Mild non-obstructive CAD. Aggressive risk factor modification is recommended.   4. Aneurysmal dilatation of the ascending aorta with maximum diameter 40 x 38 mm. Annual follow up with CTA or MRA is recommended.  EKG:  No EKG today.  Recent Labs: 03/19/2022: TSH 2.840 07/03/2022: ALT 21; BUN 15; Creatinine, Ser 0.86; Hemoglobin 12.7; Platelets 264.0; Potassium 4.0; Sodium 136  Recent Lipid Panel    Component Value Date/Time   CHOL 125 07/03/2022 0917   TRIG 127.0 07/03/2022 0917   HDL 46.50 07/03/2022 0917   CHOLHDL 3 07/03/2022 0917   VLDL 25.4 07/03/2022 0917   LDLCALC 53 07/03/2022 0917   LDLDIRECT 69 03/19/2022 1144   LDLDIRECT 109.0 12/25/2017 0835   Home Medications   Current Meds  Medication Sig   aspirin 81 MG tablet Take 1 tablet (81 mg total) by mouth daily.   Calcium Carbonate-Vitamin D 600-200 MG-UNIT CAPS Take 1 capsule by mouth daily.   dicyclomine (BENTYL) 20 MG tablet Take 20 mg by mouth 3 (three) times daily.   Estradiol-Estriol-Progesterone (BIEST/PROGESTERONE TD) Place 2 mLs onto the skin in the morning and at bedtime. Apply to inner thighs or arms   famotidine (PEPCID) 40 MG tablet Take 1 tablet (40 mg total) by mouth at bedtime.   ibuprofen (ADVIL,MOTRIN) 200 MG tablet Take 600 mg by mouth every 6 (six) hours as needed for moderate pain.   loratadine (CLARITIN) 10 MG tablet Take 10 mg by mouth daily.   losartan (COZAAR) 25 MG tablet Take 1 tablet (25 mg total) by mouth daily.   methenamine (HIPREX) 1 g tablet Take 1 g by mouth 2 (two) times daily with a meal.    metoprolol tartrate (LOPRESSOR) 50 MG tablet TAKE 1 TABLET BY MOUTH TWICE DAILY. MAY TAKE AN EXTRA HALF TO 1 TABLET FOR PALPITATIONS AND/OR BLOOD PRESSURE   ondansetron (ZOFRAN-ODT) 4 MG disintegrating tablet DISSOLVE 1 TABLET(4 MG) ON THE TONGUE EVERY 8 HOURS AS  NEEDED FOR NAUSEA OR VOMITING   Probiotic Product (PROBIOTIC PO) Take by mouth daily. 900 billion   RABEprazole (ACIPHEX) 20 MG tablet Take 20 mg by mouth daily.   rosuvastatin (CRESTOR) 20 MG tablet Take 1 tablet (20 mg total) by mouth every evening.   Vitamin B Complex-C CAPS    Wheat Dextrin (BENEFIBER DRINK MIX PO) Take by mouth daily. Takes 1-2 teaspoons daily     Review of Systems  All other systems reviewed and are otherwise negative except as noted above.  Physical Exam    VS:  BP (!) 146/100   Pulse 71   Ht 5' 3.75" (1.619 m)   Wt 159 lb (72.1 kg)   BMI 27.51 kg/m  , BMI Body mass index is 27.51 kg/m.  Wt Readings from Last 3 Encounters:  07/09/22 159 lb (72.1 kg)  07/08/22 157 lb 4 oz (71.3 kg)  04/17/22 156 lb (70.8 kg)    GEN: Well nourished, well developed, in no acute distress. HEENT: normal. Neck: Supple, no JVD, carotid bruits, or masses. Cardiac: RRR, no murmurs, rubs, or gallops. No clubbing, cyanosis, edema.  Radials/PT 2+ and equal bilaterally.  Respiratory:  Respirations regular and unlabored, clear to auscultation bilaterally. GI: Soft, nontender, nondistended. MS: No deformity or atrophy. Skin: Warm and dry, no rash. Neuro:  Strength and sensation are intact. Psych: Normal affect. Anxious.   Assessment & Plan    CAD - Cardiac CTA 2018 moderate nonobstructive disease. Stable with no anginal symptoms. No indication for ischemic evaluation. GDMT includes aspirin, metoprolol, rosuvastatin. Heart healthy diet and regular cardiovascular exercise encouraged.    HTN -BP not at goal less than 130/80.  She attributes her elevated blood pressure to her current IBS flare.  She is agreeable to check at home for 2 weeks and if BP consistently greater than 130/80 plan to increase losartan to 50 mg daily.  GERD - Follows with primary care. Presently on Aciphex per primary care.   IBS - Multiple recent flares due to stressors. Is working to get an appointment  with gastroenterology.   HLD, LDL goal <70 - 06/2022 LDL 53. Continue Rosuvastatin '20mg'$  daily. Denies myalgias.   Palpitations - Well controlled on present dose Metoprolol.   MDD - Continue to follow with PCP. Has declined referral to psychology/psychiatry.   Mild dilation ascending aorta - Echo 2018 49m. CT angio 09/2019 upper levlel of normal 347m No indication for repeat monitoring at this time as normalized. Continue optimal BP control and Rosuvastatin.   Disposition: Follow up in 6 month(s)  with DaGlenetta HewMD or APP.  Signed, CaLoel DubonnetNP 07/09/2022, 1:14 PM [facility]

## 2022-07-16 DIAGNOSIS — Z8669 Personal history of other diseases of the nervous system and sense organs: Secondary | ICD-10-CM | POA: Diagnosis not present

## 2022-07-16 DIAGNOSIS — Z0389 Encounter for observation for other suspected diseases and conditions ruled out: Secondary | ICD-10-CM | POA: Diagnosis not present

## 2022-07-17 DIAGNOSIS — Z789 Other specified health status: Secondary | ICD-10-CM | POA: Insufficient documentation

## 2022-07-23 ENCOUNTER — Encounter (HOSPITAL_BASED_OUTPATIENT_CLINIC_OR_DEPARTMENT_OTHER): Payer: Self-pay

## 2022-07-23 NOTE — Telephone Encounter (Signed)
BP log as requested 

## 2022-07-29 DIAGNOSIS — Z789 Other specified health status: Secondary | ICD-10-CM | POA: Diagnosis not present

## 2022-07-29 DIAGNOSIS — R350 Frequency of micturition: Secondary | ICD-10-CM | POA: Diagnosis not present

## 2022-07-29 DIAGNOSIS — R3915 Urgency of urination: Secondary | ICD-10-CM | POA: Diagnosis not present

## 2022-07-29 DIAGNOSIS — N3281 Overactive bladder: Secondary | ICD-10-CM | POA: Diagnosis not present

## 2022-07-29 DIAGNOSIS — K582 Mixed irritable bowel syndrome: Secondary | ICD-10-CM | POA: Diagnosis not present

## 2022-07-29 DIAGNOSIS — Z960 Presence of urogenital implants: Secondary | ICD-10-CM | POA: Diagnosis not present

## 2022-07-29 DIAGNOSIS — M79605 Pain in left leg: Secondary | ICD-10-CM | POA: Diagnosis not present

## 2022-07-29 DIAGNOSIS — N390 Urinary tract infection, site not specified: Secondary | ICD-10-CM | POA: Diagnosis not present

## 2022-07-29 DIAGNOSIS — R339 Retention of urine, unspecified: Secondary | ICD-10-CM | POA: Diagnosis not present

## 2022-08-06 DIAGNOSIS — K589 Irritable bowel syndrome without diarrhea: Secondary | ICD-10-CM | POA: Diagnosis not present

## 2022-09-09 DIAGNOSIS — N390 Urinary tract infection, site not specified: Secondary | ICD-10-CM | POA: Diagnosis not present

## 2022-09-09 DIAGNOSIS — R350 Frequency of micturition: Secondary | ICD-10-CM | POA: Diagnosis not present

## 2022-09-09 DIAGNOSIS — R3915 Urgency of urination: Secondary | ICD-10-CM | POA: Diagnosis not present

## 2022-09-19 DIAGNOSIS — T83120A Displacement of urinary electronic stimulator device, initial encounter: Secondary | ICD-10-CM | POA: Diagnosis not present

## 2022-09-19 DIAGNOSIS — R350 Frequency of micturition: Secondary | ICD-10-CM | POA: Diagnosis not present

## 2022-09-19 DIAGNOSIS — Z452 Encounter for adjustment and management of vascular access device: Secondary | ICD-10-CM | POA: Diagnosis not present

## 2022-09-19 DIAGNOSIS — Z4589 Encounter for adjustment and management of other implanted devices: Secondary | ICD-10-CM | POA: Diagnosis not present

## 2022-09-19 DIAGNOSIS — R3915 Urgency of urination: Secondary | ICD-10-CM | POA: Diagnosis not present

## 2022-09-19 DIAGNOSIS — Z4542 Encounter for adjustment and management of neuropacemaker (brain) (peripheral nerve) (spinal cord): Secondary | ICD-10-CM | POA: Diagnosis not present

## 2022-10-04 DIAGNOSIS — R350 Frequency of micturition: Secondary | ICD-10-CM | POA: Diagnosis not present

## 2022-10-07 DIAGNOSIS — Z1231 Encounter for screening mammogram for malignant neoplasm of breast: Secondary | ICD-10-CM | POA: Diagnosis not present

## 2022-10-07 DIAGNOSIS — Z6826 Body mass index (BMI) 26.0-26.9, adult: Secondary | ICD-10-CM | POA: Diagnosis not present

## 2022-10-07 DIAGNOSIS — Z124 Encounter for screening for malignant neoplasm of cervix: Secondary | ICD-10-CM | POA: Diagnosis not present

## 2022-10-07 LAB — HM MAMMOGRAPHY

## 2022-10-08 ENCOUNTER — Other Ambulatory Visit: Payer: Self-pay | Admitting: Family Medicine

## 2022-10-08 DIAGNOSIS — E785 Hyperlipidemia, unspecified: Secondary | ICD-10-CM

## 2022-10-08 NOTE — Telephone Encounter (Signed)
Too soon. Rx sent on 07/08/22, #90/3 to [facility].

## 2022-10-15 ENCOUNTER — Encounter: Payer: Self-pay | Admitting: Family Medicine

## 2022-10-16 ENCOUNTER — Other Ambulatory Visit: Payer: Self-pay | Admitting: Cardiology

## 2022-10-22 ENCOUNTER — Other Ambulatory Visit (HOSPITAL_BASED_OUTPATIENT_CLINIC_OR_DEPARTMENT_OTHER): Payer: Self-pay

## 2022-10-22 DIAGNOSIS — I10 Essential (primary) hypertension: Secondary | ICD-10-CM

## 2022-10-22 NOTE — Telephone Encounter (Signed)
Received fax from [facility] on [address] requesting refills for Losartan 25 mg.

## 2022-10-23 MED ORDER — LOSARTAN POTASSIUM 25 MG PO TABS: 25.0000 mg | ORAL_TABLET | Freq: Every day | ORAL | 1 refills | Status: DC

## 2022-10-23 NOTE — Telephone Encounter (Signed)
Rx request sent to pharmacy.  

## 2022-10-28 DIAGNOSIS — H524 Presbyopia: Secondary | ICD-10-CM | POA: Diagnosis not present

## 2022-10-28 DIAGNOSIS — Z961 Presence of intraocular lens: Secondary | ICD-10-CM | POA: Diagnosis not present

## 2022-10-28 DIAGNOSIS — E119 Type 2 diabetes mellitus without complications: Secondary | ICD-10-CM | POA: Diagnosis not present

## 2022-11-04 DIAGNOSIS — R339 Retention of urine, unspecified: Secondary | ICD-10-CM | POA: Diagnosis not present

## 2022-11-08 ENCOUNTER — Ambulatory Visit: Payer: Medicare PPO | Admitting: Family Medicine

## 2022-11-08 ENCOUNTER — Encounter: Payer: Self-pay | Admitting: Family Medicine

## 2022-11-08 VITALS — BP 126/80 | HR 80 | Temp 98.0°F | Ht 63.75 in

## 2022-11-08 DIAGNOSIS — I10 Essential (primary) hypertension: Secondary | ICD-10-CM

## 2022-11-08 DIAGNOSIS — R339 Retention of urine, unspecified: Secondary | ICD-10-CM | POA: Diagnosis not present

## 2022-11-08 DIAGNOSIS — K582 Mixed irritable bowel syndrome: Secondary | ICD-10-CM

## 2022-11-08 DIAGNOSIS — E119 Type 2 diabetes mellitus without complications: Secondary | ICD-10-CM | POA: Diagnosis not present

## 2022-11-08 DIAGNOSIS — R5383 Other fatigue: Secondary | ICD-10-CM | POA: Diagnosis not present

## 2022-11-08 LAB — POCT GLYCOSYLATED HEMOGLOBIN (HGB A1C): Hemoglobin A1C: 6.1 % — AB (ref 4.0–5.6)

## 2022-11-08 NOTE — Assessment & Plan Note (Addendum)
S/p Interstim replacement 08/2022 (Evans/Sara NP).  If ongoing urinary frequency, she will reach out to urology

## 2022-11-08 NOTE — Patient Instructions (Addendum)
Sugars are doing better! - consider diabetes classes in the future.  Continue current medicines and low sugar low carb diet. Return as needed or in 8 months for physical.  Let us know if ongoing fatigue to return for labwork.

## 2022-11-08 NOTE — Assessment & Plan Note (Signed)
Chronic, severe, followed by [facility] on dicyclomine.

## 2022-11-08 NOTE — Assessment & Plan Note (Addendum)
Diet controlled - A1c actually in prediabetes range.  Continue low sugar low carb diabetic diet.  Discussed DMSE - will consider in future.

## 2022-11-08 NOTE — Progress Notes (Signed)
Patient ID: HAIZLEE HENTON, female    DOB: 11-09-1950, 72 y.o.   MRN: [id number]  This visit was conducted in person.  BP 126/80   Pulse 80   Temp 98 F (36.7 C) (Temporal)   Ht 5' 3.75" (1.619 m)   SpO2 96%   BMI 27.51 kg/m    CC: 4 mo f/u visit  Subjective:   HPI: CLARISE CHACKO is a 72 y.o. female presenting on 11/08/2022 for Medical Management of Chronic Issues (Here for 4 mo f/u. Also, c/o urinary frequency and fatigue, however she is followed by urology and will do give them a urine sample. Pt just wants to make Dr. Darnell Level aware. )   OAB, urinary retention, recurrent UTIs followed by urology Amalia Hailey). On Hiprex (methenamine) daily for UTI ppx. Also using InterStim. Most recently 09/19/2022 had Interstim replacement (removal of sacral neuromodulation device with depleted battery as well as incision for implantation of nerve stimulator electrodes, sacral nerve, placement of stage 1 and 2 Axonics, and fluoroscopy Amalia Hailey)).   Had stomach bug mid December.  Notes increased fatigue since Interstim surgery - this is slowly improving. Notes increased urinary frequency - will f/u with Dr Amalia Hailey.   IBS - doing well on dicyclomine '10mg'$  TID and benefiber once daily. Dose dropped due to nearsight blurry vision. Continues IBGard PRN, miralax PRN, lactose free diet. Sees Deliah Goody and Dr Cristina Gong [facility]).   MDD - stable period off effexor, cymbalta was not tolerated (worsened diarrhea). Remains off mood medication.   DM - does not regularly check sugars. Compliant with antihyperglycemic regimen which includes: diet controlled. Denies low sugars or hypoglycemic symptoms. Last diabetic eye exam 10/28/2022 per chart review. Glucometer brand: doesn't have this at home. Last foot exam: 2020 - DUE. DSME: declines.  Lab Results  Component Value Date   HGBA1C 6.1 (A) 11/08/2022   Diabetic Foot Exam - Simple   Simple Foot Form Diabetic Foot exam was performed with the following findings: Yes  11/08/2022  9:42 AM  Visual Inspection No deformities, no ulcerations, no other skin breakdown bilaterally: Yes Sensation Testing Intact to touch and monofilament testing bilaterally: Yes Pulse Check Posterior Tibialis and Dorsalis pulse intact bilaterally: Yes Comments 2+ DP bilaterally    Lab Results  Component Value Date   MICROALBUR <0.7 07/03/2022        Relevant past medical, surgical, family and social history reviewed and updated as indicated. Interim medical history since our last visit reviewed. Allergies and medications reviewed and updated. Outpatient Medications Prior to Visit  Medication Sig Dispense Refill   aspirin 81 MG tablet Take 1 tablet (81 mg total) by mouth daily. 30 tablet    Calcium Carbonate-Vitamin D 600-200 MG-UNIT CAPS Take 1 capsule by mouth daily.     dicyclomine (BENTYL) 10 MG capsule TAKE 1 CAPSULE BY MOUTH THREE TIMES DAILY 30 MINUTES BEFORE EATING AS NEEDED FOR ABDOMINAL CRAMPS     Estradiol-Estriol-Progesterone (BIEST/PROGESTERONE TD) Place 2 mLs onto the skin in the morning and at bedtime. Apply to inner thighs or arms     famotidine (PEPCID) 40 MG tablet Take 1 tablet (40 mg total) by mouth at bedtime. 90 tablet 3   ibuprofen (ADVIL,MOTRIN) 200 MG tablet Take 600 mg by mouth every 6 (six) hours as needed for moderate pain.     loratadine (CLARITIN) 10 MG tablet Take 10 mg by mouth daily.     losartan (COZAAR) 25 MG tablet Take 1 tablet (25 mg total) by  mouth daily. 90 tablet 1   methenamine (HIPREX) 1 g tablet Take 1 g by mouth 2 (two) times daily with a meal.   1   metoprolol tartrate (LOPRESSOR) 50 MG tablet Take 1 tablet (50 mg total) by mouth 2 (two) times daily. 90 tablet 1   ondansetron (ZOFRAN-ODT) 4 MG disintegrating tablet DISSOLVE 1 TABLET(4 MG) ON THE TONGUE EVERY 8 HOURS AS NEEDED FOR NAUSEA OR VOMITING 20 tablet 0   Probiotic Product (PROBIOTIC PO) Take by mouth daily. 900 billion     RABEprazole (ACIPHEX) 20 MG tablet Take 20 mg by  mouth daily.     rosuvastatin (CRESTOR) 20 MG tablet Take 1 tablet (20 mg total) by mouth every evening. 90 tablet 3   Vitamin B Complex-C CAPS      Wheat Dextrin (BENEFIBER DRINK MIX PO) Take by mouth daily. Takes 1-2 teaspoons daily     dicyclomine (BENTYL) 20 MG tablet Take 20 mg by mouth 3 (three) times daily.     No facility-administered medications prior to visit.     Per HPI unless specifically indicated in ROS section below Review of Systems  Objective:  BP 126/80   Pulse 80   Temp 98 F (36.7 C) (Temporal)   Ht 5' 3.75" (1.619 m)   SpO2 96%   BMI 27.51 kg/m   Wt Readings from Last 3 Encounters:  07/09/22 159 lb (72.1 kg)  07/08/22 157 lb 4 oz (71.3 kg)  04/17/22 156 lb (70.8 kg)      Physical Exam    Results for orders placed or performed in visit on 11/08/22  POCT glycosylated hemoglobin (Hb A1C)  Result Value Ref Range   Hemoglobin A1C 6.1 (A) 4.0 - 5.6 %   HbA1c POC (<> result, manual entry)     HbA1c, POC (prediabetic range)     HbA1c, POC (controlled diabetic range)      Assessment & Plan:   Problem List Items Addressed This Visit     Essential hypertension    Chronic, stable on current regimen - continue      Irritable bowel syndrome (IBS)    Chronic, severe, followed by [facility] on dicyclomine.       Relevant Medications   dicyclomine (BENTYL) 10 MG capsule   Urinary retention with incomplete bladder emptying    S/p Interstim replacement 08/2022 (Evans/Sara NP).  If ongoing urinary frequency, she will reach out to urology      Diet-controlled diabetes mellitus ([place]) - Primary    Diet controlled - A1c actually in prediabetes range.  Continue low sugar low carb diabetic diet.  Discussed DMSE - will consider in future.       Relevant Orders   POCT glycosylated hemoglobin (Hb A1C) (Completed)   Fatigue    Worse since Interstim, with some improvement noted recently. Will let me know if worsening for labwork.         No orders of the  defined types were placed in this encounter.   Orders Placed This Encounter  Procedures   POCT glycosylated hemoglobin (Hb A1C)    Patient Instructions  Sugars are doing better! - consider diabetes classes in the future.  Continue current medicines and low sugar low carb diet. Return as needed or in 8 months for physical.  Let us know if ongoing fatigue to return for labwork.   Follow up plan: Return in about 8 months (around 07/10/2023) for annual exam, prior fasting for blood work, medicare wellness visit.  Ria Bush, MD

## 2022-11-08 NOTE — Assessment & Plan Note (Signed)
Worse since Interstim, with some improvement noted recently. Will let me know if worsening for labwork.

## 2022-11-08 NOTE — Assessment & Plan Note (Signed)
Chronic, stable on current regimen - continue. 

## 2022-12-05 ENCOUNTER — Other Ambulatory Visit: Payer: Self-pay | Admitting: Family Medicine

## 2022-12-05 DIAGNOSIS — K219 Gastro-esophageal reflux disease without esophagitis: Secondary | ICD-10-CM

## 2022-12-16 ENCOUNTER — Other Ambulatory Visit: Payer: Self-pay | Admitting: Family Medicine

## 2022-12-16 NOTE — Telephone Encounter (Signed)
Refill request Zofran Last office visit 11/08/22 Last refill 09/04/21 #20

## 2022-12-17 NOTE — Telephone Encounter (Signed)
Send in 1 month supply at a time.

## 2022-12-20 ENCOUNTER — Encounter (HOSPITAL_BASED_OUTPATIENT_CLINIC_OR_DEPARTMENT_OTHER): Payer: Self-pay | Admitting: Family

## 2022-12-20 ENCOUNTER — Ambulatory Visit (HOSPITAL_BASED_OUTPATIENT_CLINIC_OR_DEPARTMENT_OTHER): Payer: Medicare PPO | Admitting: Family

## 2022-12-20 VITALS — BP 140/86 | HR 68 | Ht 63.75 in | Wt 158.0 lb

## 2022-12-20 DIAGNOSIS — I25118 Atherosclerotic heart disease of native coronary artery with other forms of angina pectoris: Secondary | ICD-10-CM

## 2022-12-20 DIAGNOSIS — K582 Mixed irritable bowel syndrome: Secondary | ICD-10-CM | POA: Diagnosis not present

## 2022-12-20 DIAGNOSIS — R002 Palpitations: Secondary | ICD-10-CM | POA: Diagnosis not present

## 2022-12-20 DIAGNOSIS — E785 Hyperlipidemia, unspecified: Secondary | ICD-10-CM

## 2022-12-20 DIAGNOSIS — R5383 Other fatigue: Secondary | ICD-10-CM

## 2022-12-20 DIAGNOSIS — I10 Essential (primary) hypertension: Secondary | ICD-10-CM | POA: Diagnosis not present

## 2022-12-20 MED ORDER — METOPROLOL TARTRATE 50 MG PO TABS: 50.0000 mg | ORAL_TABLET | Freq: Two times a day (BID) | ORAL | 3 refills | Status: DC

## 2022-12-20 MED ORDER — LOSARTAN POTASSIUM 50 MG PO TABS: 50.0000 mg | ORAL_TABLET | Freq: Every day | ORAL | 3 refills | Status: DC

## 2022-12-20 NOTE — Progress Notes (Signed)
Office Visit    Patient Name: Shelby Vazquez Date of Encounter: 12/20/2022  PCP:  Ria Bush, [facility]  Cardiologist:  Glenetta Hew, MD  Advanced Practice Provider:  No care team member to display Electrophysiologist:  None     Chief Complaint    Shelby Vazquez is a 72 y.o. female   presents today for hypertension follow up.  Past Medical History    Past Medical History:  Diagnosis Date   Abdominal aortic atherosclerosis ([place]) 10/2014   by CT scan   B12 deficiency    Chronic anxiety    Chronic cough    cyclical cough syndrome   Complication of anesthesia 9 yrs ago   awake and in severe pain with colonoscopy   Concussion    Diet-controlled diabetes mellitus ([place]) 02/22/2017   New dx 02/2017   GERD (gastroesophageal reflux disease)    severe leading to chronic cough and hoarseness   History of Epstein-Barr virus infection 2012   Hyperlipidemia    Hypertension    IBS (irritable bowel syndrome)    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, PB-8 probiotic, healthy diet and exercise   Mild ascending aorta dilation ([place]) 11/02/2017   By CT 09/2017 & 09/2018 - max diameter 4cm f/u annually    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   had surgery with 5 days of chemotherapy   Osteopenia 01/2015   T -0.8 spine, -1.7 hip   Pyelonephritis due to Escherichia coli 07/16/2017   Uterine prolapse    Vocal cord paralysis, bilateral partial 2001   evaluated at [facility] - did not require surgery   Past Surgical History:  Procedure Laterality Date   BUNIONECTOMY     [place] '2000   CATARACT EXTRACTION     right 01/2010, left may 2011   COLONOSCOPY  03/2018   diverticulosis, rpt 5 yrs ([name])   COLONOSCOPY WITH PROPOFOL N/A 03/31/2014   sessile serrated adenomas, sigmoid diverticulosis, rpt 3 yrs Ronald Lobo V, MD)   CORONARY CALCIUM SCORE-CORONARY CTA  09/2017   Calcium score 53 . Normal right dominant coronary artery system with mild  onset of CAD. Aneurysmal dilation of the ascending aorta with maximum diameter diameter of 4.0-3.8 centimeter. -Recommend annual CT/MRA December 2018   DILATION AND CURETTAGE OF UTERUS  '77   for molar pregnancy excision   ESOPHAGOGASTRODUODENOSCOPY N/A 03/31/2014   mult gastric polyps, mild mucosal hemorrhages; ESOPHAGOGASTRODUODENOSCOPY (EGD);  Surgeon: Cleotis Nipper, MD   ESOPHAGOGASTRODUODENOSCOPY ENDOSCOPY  april 2014   INTERSTIM IMPLANT PLACEMENT  2018   toe surgery Right 09/2018   TRANSTHORACIC ECHOCARDIOGRAM  09/2017   Normal LV size and function. EF 55-60%. GR 1 DD. No regional wall motion abnormalities. No significant valvular abnormalities.    Allergies  Allergies  Allergen Reactions   Augmentin [Amoxicillin-Pot Clavulanate] Other (See Comments)    Severe diarrhea, rash, worsened acid reflux   Erythromycin Rash    History of Present Illness    Shelby Vazquez is a 72 y.o. female with a hx of hypertension, aortic atherosclerosis, hyperlipidemia, palpitations, coronary artery disease (coronary CTA with nonobstructive CAD), TIA, iron deficiency anemia, GERD, MDD last seen 07/09/22  Prior cardiac work-up includes echocardiogram 09/2017 revealing normal LVEF 55 to [id number], grade 1 diastolic dysfunction, mild dilation of ascending aorta 42 mm, trivial MR.  She underwent cardiac CTA 09/2017 revealing coronary calcium score of 53 placing her in the 72nd percentile for age and sex pressure control.  She had mild nonobstructive coronary disease (prox LAD 25-50%, LCx 0-25%). Ascending aorta measured 40x19m.   She was seen 12/20/2021 for preop clearance for removal of deep implant right foot by Ortho.  She had an episode of chest discomfort earlier that week while sitting and watching television.  Chest pain thought to be non cardiac, clearance for surgery provided, and started on low dose Losartan 12.'5mg'$  QD. At follow up 12/24/21 with her PPC dose was increased to '25mg'$  QD.   Seen 03/19/22.noted  fatigue - CMP, CBC, thyroid panel updated which were unremarkable.  Last seen 07/09/22. She was doing well from a cardiac perspective with LDL of 53 and [place]   Hospitalized in November with Interstim replacement for bladder control. Notes complicated by infection.   She presents today for follow up. Coming up in April [place] then [place]  She reports feeling well from a cardiac perspective. She continues to have difficulties with IBS and is seeing GI next week. Has had difficulties with bloating with these episodes. Reports feeling fatigued - she is not certain if she is fatigued related to IBS or alternate etiology. Has not been checking blood pressure routinely at home, but does have a cuff. Has been walking for exercise some but interested in increasing.   EKGs/Labs/Other Studies Reviewed:   The following studies were reviewed today:  Echo 09/2017 Left ventricle: The cavity size was normal. Wall thickness was    normal. Systolic function was normal. The estimated ejection    fraction was in the range of 55% to 60%. Wall motion was normal;    there were no regional wall motion abnormalities. Doppler    parameters are consistent with abnormal left ventricular    relaxation (grade 1 diastolic dysfunction).  - Aortic valve: There was no stenosis.  - Aorta: Ascending aorta dimension: 42 mm.  - Ascending aorta: The ascending aorta was mildly dilated.  - Mitral valve: There was trivial regurgitation.  - Right ventricle: The cavity size was normal. Systolic function    was normal.  - Tricuspid valve: Peak RV-RA gradient (S): 19 mm Hg.  - Pulmonary arteries: PA peak pressure: 27 mm Hg (S).  - Systemic veins: IVC measured 1.8 cm with < 50% respirophasic    variation, suggesting RA pressure 8 mmHg.   Impressions:   - Normal LV size with EF 55-60%. Normal RV size and  systolic    function. No significant valvular abnormalities.   Cardiac CTA 09/2017 FINDINGS: A 120 kV prospective scan was triggered in the descending thoracic aorta at 111 HU's. Axial non-contrast 3 mm slices were carried out through the heart. The data set was analyzed on a dedicated work station and scored using the [place] G[place] rotation speed was 250 msecs and collimation was .6 mm. 5 mg of iv Metoprolol and 0.8 mg of sl NTG was given. The 3D data set was reconstructed in 5% intervals of the 67-82 % of the R-R cycle. Diastolic phases were analyzed on a dedicated work station using MPR, MIP and VRT modes. The patient received 80 cc of contrast.   Aorta: Aneurysmal dilatation of the ascending aorta with maximum diameter 40 x 38 mm. No calcifications. No dissection.   Aortic Valve:  Trileaflet.  No calcifications.   Coronary Arteries:  Normal coronary origin.  Right dominance.   RCA is a large  dominant artery that gives rise to PDA and PLA. There is trivial plaque in the proximal RCA.   Left main is a short artery that gives rise to LAD and LCX arteries.   LAD is a large and long vessel that gives rise to one diagonal artery and wraps around the apex. There is mild calcified plaque in the proximal LAD with associated stenosis 25-50%   LCX is a non-dominant artery that gives rise to one OM1 branch. There is minimal calcified plaque in the proximal LCX artery with associated stenosis 0-25%.   Other findings:   Normal pulmonary vein drainage into the left atrium.   Normal let atrial appendage without a thrombus.   Normal size of the pulmonary artery.   IMPRESSION: 1. Coronary calcium score of 53. This was 87 percentile for age and sex matched control.   2. Normal coronary origin with right dominance.   3. Mild non-obstructive CAD. Aggressive risk factor modification is recommended.   4. Aneurysmal dilatation of the ascending aorta with maximum diameter 40  x 38 mm. Annual follow up with CTA or MRA is recommended.  EKG:  No EKG today.  Recent Labs: 03/19/2022: TSH 2.840 07/03/2022: ALT 21; BUN 15; Creatinine, Ser 0.86; Hemoglobin 12.7; Platelets 264.0; Potassium 4.0; Sodium 136  Recent Lipid Panel    Component Value Date/Time   CHOL 125 07/03/2022 0917   TRIG 127.0 07/03/2022 0917   HDL 46.50 07/03/2022 0917   CHOLHDL 3 07/03/2022 0917   VLDL 25.4 07/03/2022 0917   LDLCALC 53 07/03/2022 0917   LDLDIRECT 69 03/19/2022 1144   LDLDIRECT 109.0 12/25/2017 0835   Home Medications   Current Meds  Medication Sig   aspirin 81 MG tablet Take 1 tablet (81 mg total) by mouth daily.   Calcium Carbonate-Vitamin D 600-200 MG-UNIT CAPS Take 1 capsule by mouth daily.   dicyclomine (BENTYL) 10 MG capsule TAKE 1 CAPSULE BY MOUTH THREE TIMES DAILY 30 MINUTES BEFORE EATING AS NEEDED FOR ABDOMINAL CRAMPS   Estradiol-Estriol-Progesterone (BIEST/PROGESTERONE TD) Place 2 mLs onto the skin in the morning and at bedtime. Apply to inner thighs or arms   famotidine (PEPCID) 40 MG tablet TAKE 1 TABLET(40 MG) BY MOUTH AT BEDTIME   ibuprofen (ADVIL,MOTRIN) 200 MG tablet Take 600 mg by mouth every 6 (six) hours as needed for moderate pain.   loratadine (CLARITIN) 10 MG tablet Take 10 mg by mouth daily.   methenamine (HIPREX) 1 g tablet Take 1 g by mouth 2 (two) times daily with a meal.    ondansetron (ZOFRAN-ODT) 4 MG disintegrating tablet DISSOLVE 1 TABLET(4 MG) ON THE TONGUE EVERY 8 HOURS AS NEEDED FOR NAUSEA OR VOMITING   Probiotic Product (PROBIOTIC PO) Take by mouth daily. 900 billion   RABEprazole (ACIPHEX) 20 MG tablet Take 20 mg by mouth daily.   rosuvastatin (CRESTOR) 20 MG tablet Take 1 tablet (20 mg total) by mouth every evening.   Vitamin B Complex-C CAPS    Wheat Dextrin (BENEFIBER DRINK MIX PO) Take by mouth daily. Takes 1-2 teaspoons daily   [DISCONTINUED] losartan (COZAAR) 25 MG tablet Take 1 tablet (25 mg total) by mouth daily.   [DISCONTINUED]  metoprolol tartrate (LOPRESSOR) 50 MG tablet Take 1 tablet (50 mg total) by mouth 2 (two) times daily.     Review of Systems      All other systems reviewed and are otherwise negative except as noted above.  Physical Exam    VS:  BP (!) 140/86   Pulse  68   Ht 5' 3.75" (1.619 m)   Wt 158 lb (71.7 kg)   BMI 27.33 kg/m  , BMI Body mass index is 27.33 kg/m.  Wt Readings from Last 3 Encounters:  12/20/22 158 lb (71.7 kg)  07/09/22 159 lb (72.1 kg)  07/08/22 157 lb 4 oz (71.3 kg)    GEN: Well nourished, well developed, in no acute distress. HEENT: normal. Neck: Supple, no JVD, carotid bruits, or masses. Cardiac: RRR, no murmurs, rubs, or gallops. No clubbing, cyanosis, edema.  Radials/PT 2+ and equal bilaterally.  Respiratory:  Respirations regular and unlabored, clear to auscultation bilaterally. GI: Soft, nontender, nondistended. MS: No deformity or atrophy. Skin: Warm and dry, no rash. Neuro:  Strength and sensation are intact. Psych: Normal affect. Anxious.   Assessment & Plan    CAD - Cardiac CTA 2018 moderate nonobstructive disease. Stable with no anginal symptoms. No indication for ischemic evaluation. GDMT includes aspirin, metoprolol, rosuvastatin. Heart healthy diet and regular cardiovascular exercise encouraged.  Refer to PREP exercise program  Fatigue - Unclear if related to recent IBS flare or alternate etiology. CBC, BMP, thyroid panel today to assess for possible dehydration, anemia, thyroid abnormality as contributory.Mar[place] Kitchen   HTN - BP not at goal <130/80. Increase Losartan to '50mg'$  QD. Discussed to monitor BP at home at least 2 hours after medications and sitting for 5-10 minutes.   GERD - Follows with primary care and GI.  IBS - Continues to have flares and has follow up with GI next week.   HLD, LDL goal <70 - 06/2022 LDL 53. Continue Rosuvastatin '20mg'$  daily. Denies myalgias.   Palpitations - Well controlled on present dose Metoprolol. Refill provided.   MDD -  Continue to follow with PCP. Has previously politely declined referral to psychology/psychiatry.   Mild dilation ascending aorta - Echo 2018 79m. CT angio 09/2019 upper levlel of normal 361m No indication for repeat monitoring at this time as normalized. Continue optimal BP control and Rosuvastatin.   Disposition: Follow up in 6 month(s)  with DaGlenetta HewMD or APP.  Signed, CaLoel DubonnetNP 12/20/2022, 1:45 PM [facility]

## 2022-12-20 NOTE — Patient Instructions (Signed)
Medication Instructions:   Your physician has recommended you make the following change in your medication:   INCREASE Losartan to '50mg'$  daily May take two of your '25mg'$  tablets together to use up your current medications  We have sent a refill of your Metoprolol and Losartan to your pharmacy.   *If you need a refill on your cardiac medications before your next appointment, please call your pharmacy*  Lab Work: Your physician recommends that you return for lab work today: BMP, CBC, thyroid panel   If you have labs (blood work) drawn today and your tests are completely normal, you will receive your results only by: MyChart Message (if you have MyChart) OR A paper copy in the mail If you have any lab test that is abnormal or we need to change your treatment, we will call you to review the results.  Testing/Procedures: None ordered today.   Follow-Up: At [facility], you and your health needs are our priority.  As part of our continuing mission to provide you with exceptional heart care, we have created designated Provider Care Teams.  These Care Teams include your primary Cardiologist (physician) and Advanced Practice Providers (APPs -  Physician Assistants and Nurse Practitioners) who all work together to provide you with the care you need, when you need it.  We recommend signing up for the patient portal called "MyChart".  Sign up information is provided on this After Visit Summary.  MyChart is used to connect with patients for Virtual Visits (Telemedicine).  Patients are able to view lab/test results, encounter notes, upcoming appointments, etc.  Non-urgent messages can be sent to your provider as well.   To learn more about what you can do with MyChart, go to [email or web address].    Your next appointment:   6 month(s)  Provider:   Glenetta Hew, MD  or Loel Dubonnet, NP    Other Instructions  Tips to Measure your Blood Pressure Correctly  Our goal is for your  blood pressure to be less than 130/80.   Recommend checking blood pressure 2-3 times per week and keeping a log. Check at least 2 hours after medications.   If you need a good blood pressure cuff - Omron upper arm cuff (around $35 on [place]) is a reputable BP cuff.  Here's what you can do to ensure a correct reading:  Don't drink a caffeinated beverage or smoke during the 30 minutes before the test.  Sit quietly for five minutes before the test begins.  During the measurement, sit in a chair with your feet on the floor and your arm supported so your elbow is at about heart level.  The inflatable part of the cuff should completely cover at least 80% of your upper arm, and the cuff should be placed on bare skin, not over a shirt.  Don't talk during the measurement.   Blood pressure categories  Blood pressure category SYSTOLIC (upper number)  DIASTOLIC (lower number)  Normal Less than 120 mm Hg and Less than 80 mm Hg  Elevated 120-129 mm Hg and Less than 80 mm Hg  High blood pressure: Stage 1 hypertension 130-139 mm Hg or 80-89 mm Hg  High blood pressure: Stage 2 hypertension 140 mm Hg or higher or 90 mm Hg or higher  Hypertensive crisis (consult your doctor immediately) Higher than 180 mm Hg and/or Higher than 120 mm Hg  Source: American Heart Association and American Stroke Association. For more on getting your blood pressure under control, buy  Controlling Your Blood Pressure, a Special Health Report from [facility].   Blood Pressure Log   Date   Time  Blood Pressure  Example: Nov 1 9 AM 124/78

## 2022-12-21 LAB — THYROID PANEL WITH TSH
Free Thyroxine Index: 2.1 (ref 1.2–4.9)
T3 Uptake Ratio: 27 % (ref 24–39)
T4, Total: 7.7 ug/dL (ref 4.5–12.0)
TSH: 3.36 u[IU]/mL (ref 0.450–4.500)

## 2022-12-21 LAB — BASIC METABOLIC PANEL
BUN/Creatinine Ratio: 20 (ref 12–28)
BUN: 17 mg/dL (ref 8–27)
CO2: 22 mmol/L (ref 20–29)
Calcium: 10 mg/dL (ref 8.7–10.3)
Chloride: 100 mmol/L (ref 96–106)
Creatinine, Ser: 0.83 mg/dL (ref 0.57–1.00)
Glucose: 103 mg/dL — ABNORMAL HIGH (ref 70–99)
Potassium: 4.7 mmol/L (ref 3.5–5.2)
Sodium: 137 mmol/L (ref 134–144)
eGFR: 75 mL/min/{1.73_m2} (ref >59)

## 2022-12-21 LAB — CBC
Hematocrit: 41.3 % (ref 34.0–46.6)
Hemoglobin: 13.9 g/dL (ref 11.1–15.9)
MCH: 31.1 pg (ref 26.6–33.0)
MCHC: 33.7 g/dL (ref 31.5–35.7)
MCV: 92 fL (ref 79–97)
Platelets: 304 10*3/uL (ref 150–450)
RBC: 4.47 x10E6/uL (ref 3.77–5.28)
RDW: 12.1 % (ref 11.7–15.4)
WBC: 6.8 10*3/uL (ref 3.4–10.8)

## 2022-12-30 DIAGNOSIS — F411 Generalized anxiety disorder: Secondary | ICD-10-CM | POA: Diagnosis not present

## 2022-12-30 DIAGNOSIS — Z86010 Personal history of colonic polyps: Secondary | ICD-10-CM | POA: Diagnosis not present

## 2022-12-30 DIAGNOSIS — K589 Irritable bowel syndrome without diarrhea: Secondary | ICD-10-CM | POA: Diagnosis not present

## 2022-12-30 DIAGNOSIS — R197 Diarrhea, unspecified: Secondary | ICD-10-CM | POA: Diagnosis not present

## 2022-12-31 DIAGNOSIS — R197 Diarrhea, unspecified: Secondary | ICD-10-CM | POA: Diagnosis not present

## 2023-01-19 ENCOUNTER — Other Ambulatory Visit: Payer: Self-pay | Admitting: Cardiology

## 2023-01-19 DIAGNOSIS — I25118 Atherosclerotic heart disease of native coronary artery with other forms of angina pectoris: Secondary | ICD-10-CM

## 2023-01-19 DIAGNOSIS — R002 Palpitations: Secondary | ICD-10-CM

## 2023-02-04 DIAGNOSIS — R339 Retention of urine, unspecified: Secondary | ICD-10-CM | POA: Diagnosis not present

## 2023-02-21 ENCOUNTER — Telehealth: Payer: Self-pay

## 2023-02-21 NOTE — Telephone Encounter (Signed)
VMT pt requesting call back to discuss starting PREP on 03/10/23  

## 2023-03-05 ENCOUNTER — Other Ambulatory Visit: Payer: Self-pay

## 2023-03-05 ENCOUNTER — Inpatient Hospital Stay (HOSPITAL_COMMUNITY)
Admission: EM | Admit: 2023-03-05 | Discharge: 2023-03-07 | DRG: 287 | Disposition: A | Discharge status: Home / Self Care | Payer: Medicare PPO | Attending: Cardiology | Admitting: Cardiology

## 2023-03-05 ENCOUNTER — Emergency Department (HOSPITAL_COMMUNITY): Payer: Medicare PPO

## 2023-03-05 ENCOUNTER — Encounter (HOSPITAL_COMMUNITY): Payer: Self-pay

## 2023-03-05 DIAGNOSIS — I77819 Aortic ectasia, unspecified site: Secondary | ICD-10-CM | POA: Insufficient documentation

## 2023-03-05 DIAGNOSIS — Z79899 Other long term (current) drug therapy: Secondary | ICD-10-CM | POA: Diagnosis not present

## 2023-03-05 DIAGNOSIS — I2510 Atherosclerotic heart disease of native coronary artery without angina pectoris: Secondary | ICD-10-CM | POA: Diagnosis present

## 2023-03-05 DIAGNOSIS — E785 Hyperlipidemia, unspecified: Secondary | ICD-10-CM | POA: Diagnosis present

## 2023-03-05 DIAGNOSIS — E119 Type 2 diabetes mellitus without complications: Secondary | ICD-10-CM | POA: Diagnosis present

## 2023-03-05 DIAGNOSIS — R946 Abnormal results of thyroid function studies: Secondary | ICD-10-CM | POA: Diagnosis not present

## 2023-03-05 DIAGNOSIS — Z7982 Long term (current) use of aspirin: Secondary | ICD-10-CM

## 2023-03-05 DIAGNOSIS — I700 Atherosclerosis of aorta: Secondary | ICD-10-CM | POA: Diagnosis not present

## 2023-03-05 DIAGNOSIS — R053 Chronic cough: Secondary | ICD-10-CM | POA: Diagnosis present

## 2023-03-05 DIAGNOSIS — Z87820 Personal history of traumatic brain injury: Secondary | ICD-10-CM

## 2023-03-05 DIAGNOSIS — Z8249 Family history of ischemic heart disease and other diseases of the circulatory system: Secondary | ICD-10-CM | POA: Diagnosis not present

## 2023-03-05 DIAGNOSIS — Z881 Allergy status to other antibiotic agents status: Secondary | ICD-10-CM

## 2023-03-05 DIAGNOSIS — F419 Anxiety disorder, unspecified: Secondary | ICD-10-CM | POA: Diagnosis present

## 2023-03-05 DIAGNOSIS — Z79890 Hormone replacement therapy (postmenopausal): Secondary | ICD-10-CM | POA: Diagnosis not present

## 2023-03-05 DIAGNOSIS — K589 Irritable bowel syndrome without diarrhea: Secondary | ICD-10-CM | POA: Diagnosis present

## 2023-03-05 DIAGNOSIS — I2489 Other forms of acute ischemic heart disease: Secondary | ICD-10-CM | POA: Diagnosis not present

## 2023-03-05 DIAGNOSIS — I7121 Aneurysm of the ascending aorta, without rupture: Secondary | ICD-10-CM | POA: Diagnosis not present

## 2023-03-05 DIAGNOSIS — D509 Iron deficiency anemia, unspecified: Secondary | ICD-10-CM | POA: Diagnosis present

## 2023-03-05 DIAGNOSIS — I5181 Takotsubo syndrome: Secondary | ICD-10-CM | POA: Diagnosis not present

## 2023-03-05 DIAGNOSIS — Z9221 Personal history of antineoplastic chemotherapy: Secondary | ICD-10-CM | POA: Diagnosis not present

## 2023-03-05 DIAGNOSIS — Z8673 Personal history of transient ischemic attack (TIA), and cerebral infarction without residual deficits: Secondary | ICD-10-CM | POA: Diagnosis not present

## 2023-03-05 DIAGNOSIS — K219 Gastro-esophageal reflux disease without esophagitis: Secondary | ICD-10-CM | POA: Diagnosis present

## 2023-03-05 DIAGNOSIS — E1169 Type 2 diabetes mellitus with other specified complication: Secondary | ICD-10-CM | POA: Diagnosis present

## 2023-03-05 DIAGNOSIS — I214 Non-ST elevation (NSTEMI) myocardial infarction: Secondary | ICD-10-CM | POA: Diagnosis not present

## 2023-03-05 DIAGNOSIS — R079 Chest pain, unspecified: Secondary | ICD-10-CM | POA: Diagnosis not present

## 2023-03-05 DIAGNOSIS — I10 Essential (primary) hypertension: Secondary | ICD-10-CM | POA: Diagnosis not present

## 2023-03-05 LAB — TROPONIN I (HIGH SENSITIVITY)
Troponin I (High Sensitivity): 437 ng/L (ref <18)
Troponin I (High Sensitivity): 755 ng/L (ref <18)

## 2023-03-05 LAB — BASIC METABOLIC PANEL
Anion gap: 12 (ref 5–15)
BUN: 12 mg/dL (ref 8–23)
CO2: 23 mmol/L (ref 22–32)
Calcium: 9.8 mg/dL (ref 8.9–10.3)
Chloride: 99 mmol/L (ref 98–111)
Creatinine, Ser: 0.92 mg/dL (ref 0.44–1.00)
GFR, Estimated: >60 mL/min (ref >60)
Glucose, Bld: 117 mg/dL — ABNORMAL HIGH (ref 70–99)
Potassium: 3.9 mmol/L (ref 3.5–5.1)
Sodium: 134 mmol/L — ABNORMAL LOW (ref 135–145)

## 2023-03-05 LAB — CBC
HCT: 40.8 % (ref 36.0–46.0)
Hemoglobin: 13.4 g/dL (ref 12.0–15.0)
MCH: 30.3 pg (ref 26.0–34.0)
MCHC: 32.8 g/dL (ref 30.0–36.0)
MCV: 92.3 fL (ref 80.0–100.0)
Platelets: 310 10*3/uL (ref 150–400)
RBC: 4.42 MIL/uL (ref 3.87–5.11)
RDW: 12.5 % (ref 11.5–15.5)
WBC: 7.8 10*3/uL (ref 4.0–10.5)
nRBC: 0 % (ref 0.0–0.2)

## 2023-03-05 LAB — HEPATIC FUNCTION PANEL
ALT: 20 U/L (ref 0–44)
AST: 30 U/L (ref 15–41)
Albumin: 3.8 g/dL (ref 3.5–5.0)
Alkaline Phosphatase: 38 U/L (ref 38–126)
Bilirubin, Direct: 0.2 mg/dL (ref 0.0–0.2)
Indirect Bilirubin: 0.4 mg/dL (ref 0.3–0.9)
Total Bilirubin: 0.6 mg/dL (ref 0.3–1.2)
Total Protein: 6.2 g/dL — ABNORMAL LOW (ref 6.5–8.1)

## 2023-03-05 LAB — LIPASE, BLOOD: Lipase: 31 U/L (ref 11–51)

## 2023-03-05 MED ORDER — NITROGLYCERIN 0.4 MG SL SUBL
0.4000 mg | SUBLINGUAL_TABLET | SUBLINGUAL | Status: DC | PRN
  Administered 2023-03-05 (×3): 0.4 mg via SUBLINGUAL
  Filled 2023-03-05 (×3): qty 1

## 2023-03-05 MED ORDER — ASPIRIN 81 MG PO CHEW
243.0000 mg | CHEWABLE_TABLET | Freq: Once | ORAL | Status: AC
  Administered 2023-03-05: 243 mg via ORAL
  Filled 2023-03-05: qty 3

## 2023-03-05 MED ORDER — ONDANSETRON HCL 4 MG/2ML IJ SOLN
4.0000 mg | Freq: Once | INTRAMUSCULAR | Status: AC
  Administered 2023-03-05: 4 mg via INTRAVENOUS
  Filled 2023-03-05: qty 2

## 2023-03-05 MED ORDER — HEPARIN (PORCINE) 25000 UT/250ML-% IV SOLN
1100.0000 [IU]/h | INTRAVENOUS | Status: DC
  Administered 2023-03-05: 850 [IU]/h via INTRAVENOUS
  Filled 2023-03-05: qty 250

## 2023-03-05 MED ORDER — HEPARIN BOLUS VIA INFUSION
4000.0000 [IU] | Freq: Once | INTRAVENOUS | Status: AC
  Administered 2023-03-05: 4000 [IU] via INTRAVENOUS
  Filled 2023-03-05: qty 4000

## 2023-03-05 NOTE — Progress Notes (Signed)
ANTICOAGULATION CONSULT NOTE - Initial Consult  Pharmacy Consult for Heparin Indication: chest pain/ACS  Allergies  Allergen Reactions   Augmentin [Amoxicillin-Pot Clavulanate] Other (See Comments)    Severe diarrhea, rash, worsened acid reflux   Erythromycin Rash    Patient Measurements: Height: 5\' 4"  (162.6 cm) Weight: 71.7 kg (158 lb) IBW/kg (Calculated) : 54.7 Heparin Dosing Weight: 69.4 kg  Vital Signs: Temp: 97.6 F (36.4 C) (05/15 1324) BP: 117/75 (05/15 1615) Pulse Rate: 72 (05/15 1615)  Labs: Recent Labs    03/05/23 1446  HGB 13.4  HCT 40.8  PLT 310  CREATININE 0.92  TROPONINIHS 437*    Estimated Creatinine Clearance: 53.7 mL/min (by C-G formula based on SCr of 0.92 mg/dL).   Medical History: Past Medical History:  Diagnosis Date   Abdominal aortic atherosclerosis ([facility]) 10/2014   by CT scan   B12 deficiency    Chronic anxiety    Chronic cough    cyclical cough syndrome   Complication of anesthesia 9 yrs ago   awake and in severe pain with colonoscopy   Concussion    Diet-controlled diabetes mellitus ([facility]) 02/22/2017   New dx 02/2017   GERD (gastroesophageal reflux disease)    severe leading to chronic cough and hoarseness   History of Epstein-Barr virus infection 2012   Hyperlipidemia    Hypertension    IBS (irritable bowel syndrome)    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, PB-8 probiotic, healthy diet and exercise   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   By CT 09/2017 & 09/2018 - max diameter 4cm f/u annually    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   had surgery with 5 days of chemotherapy   Osteopenia 01/2015   T -0.8 spine, -1.7 hip   Pyelonephritis due to Escherichia coli 07/16/2017   Uterine prolapse    Vocal cord paralysis, bilateral partial 2001   evaluated at [facility] - did not require surgery    Medications:  (Not in a hospital admission)  Scheduled:  Infusions:  PRN: nitroGLYCERIN  Assessment: 72 yof with a  history of DM, GERD, HLD, HTN, IBS, mild ascending aorta dilation. Patient is presenting with acute onset chest heaviness which began suddenly with bilateral arm weakness and nausea . Heparin per pharmacy consult placed for chest pain/ACS.  Patient is not on anticoagulation prior to arrival.  Hgb 13.4; plt 310  Goal of Therapy:  Heparin level 0.3-0.7 units/ml Monitor platelets by anticoagulation protocol: Yes   Plan:  Give IV heparin 4000 units bolus x 1 Start heparin infusion at 850 units/hr Check anti-Xa level in 8 hours and daily while on heparin Continue to monitor H&H and platelets  Delmar Landau, PharmD, BCPS 03/05/2023 5:15 PM ED Clinical Pharmacist -  [phone]

## 2023-03-05 NOTE — ED Notes (Signed)
Admitting MD at bedside.

## 2023-03-05 NOTE — ED Provider Notes (Signed)
Emergency Department Provider Note   I have reviewed the triage vital signs and the nursing notes.   HISTORY  Chief Complaint Chest Pain   HPI Shelby Vazquez is a 72 y.o. female past history reviewed below presents emergency department with acute onset chest heaviness which began suddenly with bilateral arm weakness and nausea.  She felt somewhat lightheaded as well.  No vomiting or diaphoresis.  She is felt intermittent symptoms similar to this in the past and attributed it to GERD.  She does follow with cardiology and has a history of CAD found on CT coronary from 2018.  No prior history of PCI.  Patient received nitroglycerin with improvement in pain symptoms.   Past Medical History:  Diagnosis Date   Abdominal aortic atherosclerosis (HCC) 10/2014   by CT scan   B12 deficiency    Chronic anxiety    Chronic cough    cyclical cough syndrome   Complication of anesthesia 9 yrs ago   awake and in severe pain with colonoscopy   Concussion    Diet-controlled diabetes mellitus (HCC) 02/22/2017   New dx 02/2017   GERD (gastroesophageal reflux disease)    severe leading to chronic cough and hoarseness   History of Epstein-Barr virus infection 2012   Hyperlipidemia    Hypertension    IBS (irritable bowel syndrome)    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, PB-8 probiotic, healthy diet and exercise   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 Respiratory: Denies shortness of breath. Gastrointestinal: No abdominal pain.  Positive nausea, no vomiting.  No diarrhea.  No constipation. Genitourinary: Negative for dysuria. Musculoskeletal: Negative for back pain. Skin: Negative for rash. Neurological: Negative for headaches.   ____________________________________________   PHYSICAL EXAM:  VITAL SIGNS: ED Triage Vitals  Enc Vitals Group     BP 03/05/23 1324 (!) 163/112     Pulse Rate 03/05/23 1324 81     Resp 03/05/23 1324 (!) 22     Temp 03/05/23 1324 97.6 F (36.4 C)     Temp src --      SpO2 03/05/23 1324 97 %     Weight 03/05/23 1350 158 lb (71.7 kg)     Height 03/05/23 1350 5\' 4"  (1.626 m)   Constitutional: Alert and oriented. Well appearing and in no acute distress. Eyes: Conjunctivae are normal.  Head: Atraumatic. Nose: No congestion/rhinnorhea. Mouth/Throat: Mucous membranes are moist. Neck: No stridor.  Cardiovascular: Normal rate, regular rhythm. Good peripheral circulation. Grossly normal heart sounds.   Respiratory: Normal respiratory effort.  No retractions. Lungs CTAB. Gastrointestinal: Soft and nontender. No distention.  Musculoskeletal: No lower extremity tenderness nor edema. No gross deformities of extremities. Neurologic:  Normal speech and language. No gross focal neurologic deficits are appreciated.  Skin:  Skin is warm, dry and intact. No rash noted.   ____________________________________________   LABS (all labs ordered are listed, but only abnormal results are displayed)  Labs Reviewed  BASIC METABOLIC PANEL - Abnormal; Notable for the  following components:      Result Value   Sodium 134 (*)    Glucose, Bld 117 (*)    All other components within normal limits  TROPONIN I (HIGH SENSITIVITY) - Abnormal; Notable for the following components:   Troponin I (High Sensitivity) 437 (*)    All other components within normal limits  CBC  HEPATIC FUNCTION PANEL  LIPASE, BLOOD  TROPONIN I (HIGH SENSITIVITY)   ____________________________________________  EKG   EKG  Interpretation  Date/Time:  Wednesday Mar 05 2023 13:28:36 EDT Ventricular Rate:  77 PR Interval:  152 QRS Duration: 74 QT Interval:  378 QTC Calculation: 427 R Axis:   -29 Text Interpretation: Normal sinus rhythm Normal ECG When compared with ECG of 20-Jul-2009 10:31, PREVIOUS ECG IS PRESENT Confirmed by Alona Bene [phone]) on 03/05/2023 3:26:15 PM        ____________________________________________  RADIOLOGY  DG Chest 2 View  Result Date: 03/05/2023 CLINICAL DATA:  Chest pain EXAM: CHEST - 2 VIEW COMPARISON:  Chest x-ray 08/04/2017 FINDINGS: The heart size and mediastinal contours are within normal limits. Both lungs are clear. The visualized skeletal structures are unremarkable. IMPRESSION: No active cardiopulmonary disease. Electronically Signed   By: Darliss Cheney M.D.   On: 03/05/2023 15:08    ____________________________________________   PROCEDURES  Procedure(s) performed:   Procedures  CRITICAL CARE Performed by: Maia Plan Total critical care time: 35 minutes Critical care time was exclusive of separately billable procedures and treating other patients. Critical care was necessary to treat or prevent imminent or life-threatening deterioration. Critical care was time spent personally by me on the following activities: development of treatment plan with patient and/or surrogate as well as nursing, discussions with consultants, evaluation of patient's response to treatment, examination of patient, obtaining history from patient or surrogate, ordering and performing treatments and interventions, ordering and review of laboratory studies, ordering and review of radiographic studies, pulse oximetry and re-evaluation of patient's condition.  Alona Bene, MD Emergency Medicine  ____________________________________________   INITIAL IMPRESSION / ASSESSMENT AND PLAN / ED COURSE  Pertinent labs & imaging results that were available during my care of the patient were  reviewed by me and considered in my medical decision making (see chart for details).   This patient is Presenting for Evaluation of CP, which does require a range of treatment options, and is a complaint that involves a high risk of morbidity and mortality.  The Differential Diagnoses includes but is not exclusive to acute coronary syndrome, aortic dissection, pulmonary embolism, cardiac tamponade, community-acquired pneumonia, pericarditis, musculoskeletal chest wall pain, etc.   Critical Interventions-    Medications  nitroGLYCERIN (NITROSTAT) SL tablet 0.4 mg (0.4 mg Sublingual Given 03/05/23 1617)  ondansetron (ZOFRAN) injection 4 mg (4 mg Intravenous Given 03/05/23 1449)  aspirin chewable tablet 243 mg (243 mg Oral Given 03/05/23 1702)    Reassessment after intervention: Patient is CP-free currently.    I did obtain Additional Historical Information from husband at bedside.   I decided to review pertinent External Data, and in summary CT coronary in 2018 with moderate disease.   Clinical Laboratory Tests Ordered, included opponent 437.  No acute kidney injury or anemia.   Radiologic Tests Ordered, included CXR. I independently interpreted the images and agree with radiology interpretation.   Cardiac Monitor Tracing which shows NSR.    Social Determinants of Health Risk patient is a non-smoker.   Consult complete with Cardiology, Dr. [place]. Plan for Cards admit. Will start heparin.  Medical Decision Making: Summary:  Patient presents emergency department with concerning chest pain story as outlined above.  Pain improved with nitroglycerin.  Do not see STEMI activation criteria on arrival.  Differential as above.  Patient is followed by [facility].  Reevaluation with update and discussion with patient. She is CP free after 3rd nitroglycerine. Troponin mildly elevated.   Patient's presentation is most consistent with acute presentation with potential threat to life  or bodily function.   Disposition: admit  ____________________________________________  FINAL CLINICAL IMPRESSION(S) / ED DIAGNOSES  Final diagnoses:  NSTEMI (non-ST elevated myocardial infarction) [facility])   Note:  This document was prepared using Dragon voice recognition software and may include unintentional dictation errors.  Alona Bene, MD, [facility] Emergency Medicine    Dartagnan Beavers, Arlyss Repress, MD 03/05/23 [phone]

## 2023-03-05 NOTE — ED Triage Notes (Addendum)
Pt arrives POV with complaints of centralized chest pain and bil arm weakness that started an hour prior to arrival. Pt denies pain radiation. Pt arrives to triage in tears and feeling weak from the pain and nauseous.

## 2023-03-05 NOTE — H&P (Signed)
Cardiology Admission History and Physical   Patient ID: Shelby Vazquez MRN: [id number]; DOB: 09-19-1951   Admission date: 03/05/2023  PCP:  Eustaquio Boyden, MD   [facility] Providers Cardiologist:  Bryan Lemma, MD        Chief Complaint:  chest  pain  Patient Profile:   Shelby Vazquez is a 72 y.o. female with history of CAD who is being seen 03/05/2023 for the evaluation of chest pain.  History of Present Illness:   Shelby Vazquez with a hx of hypertension, aortic atherosclerosis, hyperlipidemia, palpitations, coronary artery disease (coronary CTA in 2018 with nonobstructive CAD), TIA, iron deficiency anemia, GERD, presents now with acute chest pain. Was seen in office in March and doing well from a CV standpoint. Today was in a meeting and then left with her husband to get in their car. She developed mid lower sternal chest pain. They ran a couple of errands and the pain became more severe. States it was the worse pain she has ever had. Felt her arms were heavy and arm muscles ached. She became nauseated but no vomiting. No SOB. Drove to ED. Pain finally relieved after 3 sl Ntg but altogether lasted about 4 hours. Now pain free. Has a history of GERD but pain was very different from this.    Past Medical History:  Diagnosis Date   Abdominal aortic atherosclerosis (HCC) 10/2014   by CT scan   B12 deficiency    Chronic anxiety    Chronic cough    cyclical cough syndrome   Complication of anesthesia 9 yrs ago   awake and in severe pain with colonoscopy   Concussion    Diet-controlled diabetes mellitus (HCC) 02/22/2017   New dx 02/2017   GERD (gastroesophageal reflux disease)    severe leading to chronic cough and hoarseness   History of Epstein-Barr virus infection 2012   Hyperlipidemia    Hypertension    IBS (irritable bowel syndrome)    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, PB-8 probiotic, healthy diet and exercise   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   By CT  09/2017 & 09/2018 - max diameter 4cm f/u annually    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   had surgery with 5 days of chemotherapy   Osteopenia 01/2015   T -0.8 spine, -1.7 hip   Pyelonephritis due to Escherichia coli 07/16/2017   Uterine prolapse    Vocal cord paralysis, bilateral partial 2001   evaluated at [facility] - did not require surgery    Past Surgical History:  Procedure Laterality Date   BUNIONECTOMY     [place] '2000   CATARACT EXTRACTION     right 01/2010, left may 2011   COLONOSCOPY  03/2018   diverticulosis, rpt 5 yrs ([name])   COLONOSCOPY WITH PROPOFOL N/A 03/31/2014   sessile serrated adenomas, sigmoid diverticulosis, rpt 3 yrs Bernette Redbird V, MD)   CORONARY CALCIUM SCORE-CORONARY CTA  09/2017   Calcium score 53 . Normal right dominant coronary artery system with mild onset of CAD. Aneurysmal dilation of the ascending aorta with maximum diameter diameter of 4.0-3.8 centimeter. -Recommend annual CT/MRA December 2018   DILATION AND CURETTAGE OF UTERUS  '77   for molar pregnancy excision   ESOPHAGOGASTRODUODENOSCOPY N/A 03/31/2014   mult gastric polyps, mild mucosal hemorrhages; ESOPHAGOGASTRODUODENOSCOPY (EGD);  Surgeon: Florencia Reasons, MD   ESOPHAGOGASTRODUODENOSCOPY ENDOSCOPY  april 2014   INTERSTIM IMPLANT PLACEMENT  2018   toe surgery Right 09/2018  TRANSTHORACIC ECHOCARDIOGRAM  09/2017   Normal LV size and function. EF 55-60%. GR 1 DD. No regional wall motion abnormalities. No significant valvular abnormalities.     Medications Prior to Admission: Prior to Admission medications   Medication Sig Start Date End Date Taking? Authorizing Provider  aspirin 81 MG tablet Take 1 tablet (81 mg total) by mouth daily. 08/18/17  Yes Eustaquio Boyden, MD  Calcium Carbonate-Vitamin D 600-200 MG-UNIT CAPS Take 1 capsule by mouth daily.    [provider]  dicyclomine (BENTYL) 10 MG capsule TAKE 1 CAPSULE BY MOUTH THREE TIMES DAILY 30 MINUTES  BEFORE EATING AS NEEDED FOR ABDOMINAL CRAMPS 02/24/18   [provider]  Estradiol-Estriol-Progesterone (BIEST/PROGESTERONE TD) Place 2 mLs onto the skin in the morning and at bedtime. Apply to inner thighs or arms    [provider]  famotidine (PEPCID) 40 MG tablet TAKE 1 TABLET(40 MG) BY MOUTH AT BEDTIME 12/05/22   Eustaquio Boyden, MD  ibuprofen (ADVIL,MOTRIN) 200 MG tablet Take 600 mg by mouth every 6 (six) hours as needed for moderate pain.    [provider]  loratadine (CLARITIN) 10 MG tablet Take 10 mg by mouth daily.    [provider]  losartan (COZAAR) 50 MG tablet Take 1 tablet (50 mg total) by mouth daily. 12/20/22 12/15/23  Alver Sorrow, NP  methenamine (HIPREX) 1 g tablet Take 1 g by mouth 2 (two) times daily with a meal.  01/08/17   [provider]  metoprolol tartrate (LOPRESSOR) 50 MG tablet TAKE 1 TABLET(50 MG) BY MOUTH TWICE DAILY 01/20/23   Alver Sorrow, NP  ondansetron (ZOFRAN-ODT) 4 MG disintegrating tablet DISSOLVE 1 TABLET(4 MG) ON THE TONGUE EVERY 8 HOURS AS NEEDED FOR NAUSEA OR VOMITING 12/17/22   Eustaquio Boyden, MD  Probiotic Product (PROBIOTIC PO) Take by mouth daily. 900 billion    [provider]  RABEprazole (ACIPHEX) 20 MG tablet Take 20 mg by mouth daily.    [provider]  rosuvastatin (CRESTOR) 20 MG tablet Take 1 tablet (20 mg total) by mouth every evening. 07/08/22   Eustaquio Boyden, MD  Vitamin B Complex-C CAPS     [provider]  Wheat Dextrin (BENEFIBER DRINK MIX PO) Take by mouth daily. 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 2 daughters '74, '80; 5 grandchildren.    No h/o physical or sexual abuse. 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    Edu: [employer].  Activity: stays active.   Diet: good water, fruits/vegetables daily   Social Determinants of Health   Financial Resource Strain: Low Risk  (04/17/2022)   Overall Financial Resource Strain (CARDIA)    Difficulty of Paying Living Expenses: Not hard at all  Food Insecurity: No Food Insecurity (04/17/2022)   Hunger Vital Sign    Worried About Running Out of Food in the Last Year: Never true    Ran Out of Food in the Last Year: Never true  Transportation Needs: No Transportation Needs (04/17/2022)   PRAPARE - Administrator, Civil Service (Medical): No    Lack of Transportation (Non-Medical): No  Physical Activity: Inactive (04/17/2022)   Exercise Vital Sign    Days of Exercise per Week: 0 days    Minutes of Exercise per Session: 0 min  Stress: Stress Concern Present (04/17/2022)   [employer] of Occupational Health - Occupational Stress Questionnaire    Feeling of Stress : To some extent  Social Connections: Moderately Isolated (04/17/2022)   Social Connection and Isolation Panel [NHANES]    Frequency of Communication with Friends and Family: More than three times a week    Frequency of Social Gatherings with Friends and Family: More than three times a week    Attends Religious Services: Never    Database administrator or  Organizations: No    Attends Banker Meetings: Never    Marital Status: Married  Catering manager Violence: Not At Risk (04/17/2022)   Humiliation, Afraid, Rape, and Kick questionnaire    Fear of Current or Ex-Partner: No    Emotionally Abused: No    Physically Abused: No    Sexually Abused: No    Family History:   The patient's family history includes Cancer in her father and maternal aunt; Heart attack in her maternal grandfather and maternal grandmother; Heart attack (age of onset: 62) in her mother; Heart disease in her paternal grandfather and paternal grandmother; Heart disease (age of onset: 74) in her mother; Hyperlipidemia in her brother and mother; Hypertension in her brother and mother. There is no history of Diabetes.    ROS:  Please see the history of present illness.  All other ROS reviewed and negative.     Physical Exam/Data:   Vitals:   03/05/23 1530 03/05/23 1545 03/05/23 1600 03/05/23 1615  BP: (!) 145/95 125/79 117/81 117/75  Pulse: 77 71 79 72  Resp: 16 18 15 18   Temp:      SpO2: 99% 100% 96% 99%  Weight:      Height:       No intake or output data in the 24 hours ending 03/05/23 1828    03/05/2023    1:50 PM 12/20/2022   10:03 AM 07/09/2022   11:13 AM  Last 3 Weights  Weight (lbs) 158 lb 158 lb 159 lb  Weight (kg) 71.668 kg 71.668 kg 72.122 kg     Body mass index is 27.12 kg/m.  General:  Well nourished, well developed, in no acute distress HEENT: normal Neck: no JVD Vascular: No carotid bruits; Distal pulses 2+ bilaterally   Cardiac:  normal S1, S2; RRR; no murmur  Lungs:  clear to auscultation bilaterally, no wheezing, rhonchi or rales  Abd: soft, nontender, no hepatomegaly  Ext: no edema Musculoskeletal:  No deformities, BUE and BLE strength normal and equal Skin: warm and dry  Neuro:  CNs 2-12 intact, no focal abnormalities noted Psych:  Normal affect    EKG:  The ECG that was done today was personally reviewed and demonstrates  NSR with normal Ecg  Relevant CV Studies: Echo 09/24/17: Study Conclusions   - Left ventricle: The cavity size was normal. Wall thickness was    normal. Systolic function was normal. The estimated ejection    fraction was in the range of 55% to 60%. Wall motion was normal;    there were no regional wall motion abnormalities. Doppler    parameters are consistent with abnormal left ventricular    relaxation (grade 1 diastolic dysfunction).  - Aortic valve: There was no stenosis.  - Aorta: Ascending aorta dimension: 42 mm.  - Ascending aorta: The ascending aorta was mildly dilated.  - Mitral valve: There was trivial regurgitation.  - Right ventricle: The cavity size was normal. Systolic function    was normal.  - Tricuspid valve: Peak RV-RA gradient (S): 19 mm Hg.  - Pulmonary arteries: PA peak pressure: 27 mm Hg (S).  - Systemic veins: IVC measured 1.8 cm with < 50% respirophasic    variation, suggesting RA pressure 8 mmHg.   Coronary CTA 10/06/17: Cardiac/Coronary  CT   TECHNIQUE: The patient was scanned on a [employer].   FINDINGS: A 120 kV prospective scan was triggered in the descending thoracic aorta at 111 HU's. Axial non-contrast 3 mm slices were carried out through the heart. The data set was analyzed on a dedicated work station and scored using the Agatson method. Gantry rotation speed was 250 msecs and collimation was .6 mm. 5 mg of iv Metoprolol and 0.8 mg of sl NTG was given. The 3D data set was reconstructed in 5% intervals of the 67-82 % of the R-R cycle. Diastolic phases were analyzed on a dedicated work station using MPR, MIP and VRT modes. The patient received 80 cc of contrast.   Aorta: Aneurysmal dilatation of the ascending aorta with maximum diameter 40 x 38 mm. No calcifications. No dissection.   Aortic Valve:  Trileaflet.  No calcifications.   Coronary Arteries:  Normal coronary origin.  Right dominance.   RCA is a large dominant artery that  gives rise to PDA and PLA. There is trivial plaque in the proximal RCA.   Left main is a short artery that gives rise to LAD and LCX arteries.   LAD is a large and long vessel that gives rise to one diagonal artery and wraps around the apex. There is mild calcified plaque in the proximal LAD with associated stenosis 25-50%   LCX is a non-dominant artery that gives rise to one OM1 branch. There is minimal calcified plaque in the proximal LCX artery with associated stenosis 0-25%.   Other findings:   Normal pulmonary vein drainage into the left atrium.   Normal let atrial appendage without a thrombus.   Normal size of the pulmonary artery.   IMPRESSION: 1. Coronary calcium score of 53. This was 72 percentile for age and sex matched control.   2. Normal coronary origin with right dominance.   3. Mild non-obstructive CAD. Aggressive risk factor modification is recommended.   4. Aneurysmal dilatation of the ascending aorta with maximum diameter 40 x 38 mm. Annual follow up with CTA or MRA is recommended.     Electronically Signed   By: Tobias Alexander   On: 10/06/2017 18:31  Laboratory Data:  High Sensitivity Troponin:   Recent Labs  Lab 03/05/23 1446 03/05/23 1622  TROPONINIHS 437* 755*      Chemistry Recent Labs  Lab  03/05/23 1446  NA 134*  K 3.9  CL 99  CO2 23  GLUCOSE 117*  BUN 12  CREATININE 0.92  CALCIUM 9.8  GFRNONAA >60  ANIONGAP 12    Recent Labs  Lab 03/05/23 1622  PROT 6.2*  ALBUMIN 3.8  AST 30  ALT 20  ALKPHOS 38  BILITOT 0.6   Lipids No results for input(s): "CHOL", "TRIG", "HDL", "LABVLDL", "LDLCALC", "CHOLHDL" in the last 168 hours. Hematology Recent Labs  Lab 03/05/23 1446  WBC 7.8  RBC 4.42  HGB 13.4  HCT 40.8  MCV 92.3  MCH 30.3  MCHC 32.8  RDW 12.5  PLT 310   Thyroid No results for input(s): "TSH", "FREET4" in the last 168 hours. BNPNo results for input(s): "BNP", "PROBNP" in the last 168 hours.  DDimer No  results for input(s): "DDIMER" in the last 168 hours.   Radiology/Studies:  DG Chest 2 View  Result Date: 03/05/2023 CLINICAL DATA:  Chest pain EXAM: CHEST - 2 VIEW COMPARISON:  Chest x-ray 08/04/2017 FINDINGS: The heart size and mediastinal contours are within normal limits. Both lungs are clear. The visualized skeletal structures are unremarkable. IMPRESSION: No active cardiopulmonary disease. Electronically Signed   By: Darliss Cheney M.D.   On: 03/05/2023 15:08     Assessment and Plan:   NSTEMI. 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 Troponin elevated 437>>755. Ecg nonacute. Fortunately pain free now. Will admit to telemetry. Start IV heparin. Continue metoprolol and ASA. Also on statin. Will arrange cardiac cath with possible PCI tomorrow. Also check Echo. The procedure and risks were reviewed including but not limited to death, myocardial infarction, stroke, arrythmias, bleeding, transfusion, emergency surgery, dye allergy, or renal dysfunction. The patient voices understanding and is agreeable to proceed. HTN controlled HLD. Last LDL well controlled on statin.  Prediabetes. A1c 6.1%.    Risk Assessment/Risk Scores:    TIMI Risk Score for Unstable Angina or Non-ST Elevation MI:   The patient's TIMI risk score is 5, which indicates a 26% risk of all cause mortality, new or recurrent myocardial infarction or need for urgent revascularization in the next 14 days.       Severity of Illness: The appropriate patient status for this patient is INPATIENT. Inpatient status is judged to be reasonable and necessary in order to provide the required intensity of service to ensure the patient's safety. The patient's presenting symptoms, physical exam findings, and initial radiographic and laboratory data in the context of their chronic comorbidities is felt to place them at high risk for further clinical deterioration.  Furthermore, it is not anticipated that the patient will be medically stable for discharge from the hospital within 2 midnights of admission.   * I certify that at the point of admission it is my clinical judgment that the patient will require inpatient hospital care spanning beyond 2 midnights from the point of admission due to high intensity of service, high risk for further deterioration and high frequency of surveillance required.*   For questions or updates, please contact [facility] Please consult [email or web address] for contact info under     Signed, Kiylee Thoreson [place], MD  03/05/2023 6:28 PM

## 2023-03-05 NOTE — ED Provider Triage Note (Signed)
Emergency Medicine Provider Triage Evaluation Note  Shelby Vazquez , a 72 y.o. female  was evaluated in triage.  Pt complains of centralized chest pressure that has been ongoing for an hour prior to arrival.  Reports pain radiates through her back, had an episode like this in the past but it was reflux and this was several years ago.  Previously followed by Dr. Herbie Baltimore of cardiology. No prior hx of CAD.   Review of Systems  Positive: Chest pain Negative: Sob,   Physical Exam  BP (!) 163/112 (BP Location: Right Arm)   Pulse 81   Temp 97.6 F (36.4 C)   Resp (!) 22   Ht 5\' 4"  (1.626 m)   Wt 71.7 kg   SpO2 97%   BMI 27.12 kg/m  Gen:   Awake, no distress   Resp:  Normal effort  MSK:   Moves extremities without difficulty  Other:    Medical Decision Making  Medically screening exam initiated at 2:04 PM.  Appropriate orders placed.  AMIREE SEE was informed that the remainder of the evaluation will be completed by another provider, this initial triage assessment does not replace that evaluation, and the importance of remaining in the ED until their evaluation is complete.     Claude Manges, PA-C 03/05/23 1409

## 2023-03-06 ENCOUNTER — Encounter (HOSPITAL_COMMUNITY)
Admission: EM | Disposition: A | Discharge status: Home / Self Care | Payer: Self-pay | Source: Home / Self Care | Attending: Cardiology

## 2023-03-06 ENCOUNTER — Inpatient Hospital Stay (HOSPITAL_COMMUNITY): Payer: Medicare PPO

## 2023-03-06 DIAGNOSIS — I2489 Other forms of acute ischemic heart disease: Secondary | ICD-10-CM | POA: Diagnosis not present

## 2023-03-06 DIAGNOSIS — I214 Non-ST elevation (NSTEMI) myocardial infarction: Secondary | ICD-10-CM | POA: Diagnosis not present

## 2023-03-06 HISTORY — PX: LEFT HEART CATH AND CORONARY ANGIOGRAPHY: CATH118249

## 2023-03-06 LAB — ECHOCARDIOGRAM COMPLETE
AR max vel: 2.78 cm2
AV Area VTI: 2.94 cm2
AV Area mean vel: 2.88 cm2
AV Mean grad: 3 mmHg
AV Peak grad: 4.8 mmHg
Ao pk vel: 1.09 m/s
Area-P 1/2: 4.4 cm2
Height: 64 in
S' Lateral: 2.7 cm
Weight: 2528 oz

## 2023-03-06 LAB — LIPID PANEL
Cholesterol: 126 mg/dL (ref 0–200)
HDL: 37 mg/dL — ABNORMAL LOW (ref >40)
LDL Cholesterol: 71 mg/dL (ref 0–99)
Total CHOL/HDL Ratio: 3.4 RATIO
Triglycerides: 88 mg/dL (ref <150)
VLDL: 18 mg/dL (ref 0–40)

## 2023-03-06 LAB — HEPARIN LEVEL (UNFRACTIONATED)
Heparin Unfractionated: 0.12 IU/mL — ABNORMAL LOW (ref 0.30–0.70)
Heparin Unfractionated: 0.1 IU/mL — ABNORMAL LOW (ref 0.30–0.70)

## 2023-03-06 LAB — HEMOGLOBIN A1C
Hgb A1c MFr Bld: 6.2 % — ABNORMAL HIGH (ref 4.8–5.6)
Mean Plasma Glucose: 131.24 mg/dL

## 2023-03-06 LAB — TSH: TSH: 4.87 u[IU]/mL — ABNORMAL HIGH (ref 0.350–4.500)

## 2023-03-06 LAB — TROPONIN I (HIGH SENSITIVITY): Troponin I (High Sensitivity): 376 ng/L (ref <18)

## 2023-03-06 SURGERY — LEFT HEART CATH AND CORONARY ANGIOGRAPHY
Anesthesia: LOCAL

## 2023-03-06 MED ORDER — ACETAMINOPHEN 325 MG PO TABS
650.0000 mg | ORAL_TABLET | ORAL | Status: DC | PRN
  Administered 2023-03-06: 650 mg via ORAL
  Filled 2023-03-06: qty 2

## 2023-03-06 MED ORDER — SACUBITRIL-VALSARTAN 24-26 MG PO TABS
1.0000 | ORAL_TABLET | Freq: Two times a day (BID) | ORAL | Status: DC
  Administered 2023-03-06 – 2023-03-07 (×2): 1 via ORAL
  Filled 2023-03-06 (×2): qty 1

## 2023-03-06 MED ORDER — SODIUM CHLORIDE 0.9 % IV SOLN: 250.0000 mL | INTRAVENOUS | Status: DC | PRN

## 2023-03-06 MED ORDER — MIDAZOLAM HCL 2 MG/2ML IJ SOLN
INTRAMUSCULAR | Status: AC
  Filled 2023-03-06: qty 2

## 2023-03-06 MED ORDER — METOPROLOL TARTRATE 50 MG PO TABS: 50.0000 mg | ORAL_TABLET | Freq: Two times a day (BID) | ORAL | Status: DC

## 2023-03-06 MED ORDER — HEPARIN (PORCINE) IN NACL 1000-0.9 UT/500ML-% IV SOLN
INTRAVENOUS | Status: DC | PRN
  Administered 2023-03-06 (×2): 500 mL

## 2023-03-06 MED ORDER — HEPARIN BOLUS VIA INFUSION
2000.0000 [IU] | Freq: Once | INTRAVENOUS | Status: AC
  Administered 2023-03-06: 2000 [IU] via INTRAVENOUS
  Filled 2023-03-06: qty 2000

## 2023-03-06 MED ORDER — FENTANYL CITRATE (PF) 100 MCG/2ML IJ SOLN
INTRAMUSCULAR | Status: DC | PRN
  Administered 2023-03-06: 25 ug via INTRAVENOUS

## 2023-03-06 MED ORDER — ASPIRIN 81 MG PO TABS: 81.0000 mg | ORAL_TABLET | Freq: Every day | ORAL | Status: DC

## 2023-03-06 MED ORDER — VERAPAMIL HCL 2.5 MG/ML IV SOLN
INTRAVENOUS | Status: DC | PRN
  Administered 2023-03-06: 10 mL via INTRA_ARTERIAL

## 2023-03-06 MED ORDER — ENOXAPARIN SODIUM 40 MG/0.4ML IJ SOSY
40.0000 mg | PREFILLED_SYRINGE | INTRAMUSCULAR | Status: DC
  Administered 2023-03-07: 40 mg via SUBCUTANEOUS
  Filled 2023-03-06: qty 0.4

## 2023-03-06 MED ORDER — PANTOPRAZOLE SODIUM 40 MG PO TBEC
40.0000 mg | DELAYED_RELEASE_TABLET | Freq: Every day | ORAL | Status: DC
  Administered 2023-03-06 – 2023-03-07 (×2): 40 mg via ORAL
  Filled 2023-03-06 (×2): qty 1

## 2023-03-06 MED ORDER — ONDANSETRON HCL 4 MG/2ML IJ SOLN: 4.0000 mg | Freq: Four times a day (QID) | INTRAMUSCULAR | Status: DC | PRN

## 2023-03-06 MED ORDER — LOSARTAN POTASSIUM 50 MG PO TABS: 50.0000 mg | ORAL_TABLET | Freq: Every day | ORAL | Status: DC

## 2023-03-06 MED ORDER — METHENAMINE MANDELATE 0.5 G PO TABS
1.0000 g | ORAL_TABLET | Freq: Two times a day (BID) | ORAL | Status: DC
  Administered 2023-03-06 – 2023-03-07 (×3): 1 g via ORAL
  Filled 2023-03-06 (×4): qty 2

## 2023-03-06 MED ORDER — ACETAMINOPHEN 325 MG PO TABS: 650.0000 mg | ORAL_TABLET | ORAL | Status: DC | PRN

## 2023-03-06 MED ORDER — ENOXAPARIN SODIUM 40 MG/0.4ML IJ SOSY: 40.0000 mg | PREFILLED_SYRINGE | INTRAMUSCULAR | Status: DC

## 2023-03-06 MED ORDER — ROSUVASTATIN CALCIUM 20 MG PO TABS
20.0000 mg | ORAL_TABLET | Freq: Every evening | ORAL | Status: DC
  Administered 2023-03-06: 20 mg via ORAL
  Filled 2023-03-06: qty 1

## 2023-03-06 MED ORDER — SODIUM CHLORIDE 0.9 % WEIGHT BASED INFUSION
3.0000 mL/kg/h | INTRAVENOUS | Status: DC
  Administered 2023-03-06: 3 mL/kg/h via INTRAVENOUS

## 2023-03-06 MED ORDER — LIDOCAINE HCL (PF) 1 % IJ SOLN
INTRAMUSCULAR | Status: AC
  Filled 2023-03-06: qty 30

## 2023-03-06 MED ORDER — HYDRALAZINE HCL 20 MG/ML IJ SOLN: 10.0000 mg | INTRAMUSCULAR | Status: AC | PRN

## 2023-03-06 MED ORDER — METOPROLOL SUCCINATE ER 50 MG PO TB24
50.0000 mg | ORAL_TABLET | Freq: Every day | ORAL | Status: DC
  Administered 2023-03-06 – 2023-03-07 (×2): 50 mg via ORAL
  Filled 2023-03-06 (×2): qty 1

## 2023-03-06 MED ORDER — ASPIRIN 81 MG PO CHEW
81.0000 mg | CHEWABLE_TABLET | Freq: Every day | ORAL | Status: DC
  Administered 2023-03-07: 81 mg via ORAL
  Filled 2023-03-06: qty 1

## 2023-03-06 MED ORDER — HEPARIN SODIUM (PORCINE) 1000 UNIT/ML IJ SOLN
INTRAMUSCULAR | Status: DC | PRN
  Administered 2023-03-06: 3600 [IU] via INTRAVENOUS

## 2023-03-06 MED ORDER — FENTANYL CITRATE (PF) 100 MCG/2ML IJ SOLN
INTRAMUSCULAR | Status: AC
  Filled 2023-03-06: qty 2

## 2023-03-06 MED ORDER — ASPIRIN 81 MG PO CHEW
81.0000 mg | CHEWABLE_TABLET | ORAL | Status: AC
  Administered 2023-03-06: 81 mg via ORAL
  Filled 2023-03-06: qty 1

## 2023-03-06 MED ORDER — SODIUM CHLORIDE 0.9% FLUSH: 3.0000 mL | Freq: Two times a day (BID) | INTRAVENOUS | Status: DC

## 2023-03-06 MED ORDER — LIDOCAINE HCL (PF) 1 % IJ SOLN
INTRAMUSCULAR | Status: DC | PRN
  Administered 2023-03-06: 2 mL

## 2023-03-06 MED ORDER — OYSTER SHELL CALCIUM/D3 500-5 MG-MCG PO TABS
1.0000 | ORAL_TABLET | Freq: Every day | ORAL | Status: DC
  Administered 2023-03-06 – 2023-03-07 (×2): 1 via ORAL
  Filled 2023-03-06 (×3): qty 1

## 2023-03-06 MED ORDER — SODIUM CHLORIDE 0.9 % IV SOLN: INTRAVENOUS | Status: AC

## 2023-03-06 MED ORDER — NITROGLYCERIN 0.4 MG SL SUBL: 0.4000 mg | SUBLINGUAL_TABLET | SUBLINGUAL | Status: DC | PRN

## 2023-03-06 MED ORDER — IOHEXOL 350 MG/ML SOLN
INTRAVENOUS | Status: DC | PRN
  Administered 2023-03-06: 50 mL

## 2023-03-06 MED ORDER — SODIUM CHLORIDE 0.9 % WEIGHT BASED INFUSION
1.0000 mL/kg/h | INTRAVENOUS | Status: DC
  Administered 2023-03-06: 1 mL/kg/h via INTRAVENOUS

## 2023-03-06 MED ORDER — HEPARIN SODIUM (PORCINE) 1000 UNIT/ML IJ SOLN
INTRAMUSCULAR | Status: AC
  Filled 2023-03-06: qty 10

## 2023-03-06 MED ORDER — SODIUM CHLORIDE 0.9% FLUSH: 3.0000 mL | INTRAVENOUS | Status: DC | PRN

## 2023-03-06 MED ORDER — PERFLUTREN LIPID MICROSPHERE
1.0000 mL | INTRAVENOUS | Status: AC | PRN
  Administered 2023-03-06: 2 mL via INTRAVENOUS

## 2023-03-06 MED ORDER — ONDANSETRON HCL 4 MG/2ML IJ SOLN
4.0000 mg | Freq: Four times a day (QID) | INTRAMUSCULAR | Status: DC | PRN
  Administered 2023-03-07: 4 mg via INTRAVENOUS
  Filled 2023-03-06: qty 2

## 2023-03-06 MED ORDER — LABETALOL HCL 5 MG/ML IV SOLN: 10.0000 mg | INTRAVENOUS | Status: AC | PRN

## 2023-03-06 MED ORDER — ASPIRIN 81 MG PO TBEC: 81.0000 mg | DELAYED_RELEASE_TABLET | Freq: Every day | ORAL | Status: DC

## 2023-03-06 MED ORDER — PSYLLIUM 95 % PO PACK
1.0000 | PACK | Freq: Every day | ORAL | Status: DC
  Administered 2023-03-06 – 2023-03-07 (×2): 1 via ORAL
  Filled 2023-03-06 (×3): qty 1

## 2023-03-06 MED ORDER — SODIUM CHLORIDE 0.9% FLUSH
3.0000 mL | Freq: Two times a day (BID) | INTRAVENOUS | Status: DC
  Administered 2023-03-06 – 2023-03-07 (×4): 3 mL via INTRAVENOUS

## 2023-03-06 MED ORDER — FAMOTIDINE 20 MG PO TABS
40.0000 mg | ORAL_TABLET | Freq: Every day | ORAL | Status: DC
  Administered 2023-03-06: 40 mg via ORAL
  Filled 2023-03-06: qty 2

## 2023-03-06 MED ORDER — MIDAZOLAM HCL 2 MG/2ML IJ SOLN
INTRAMUSCULAR | Status: DC | PRN
  Administered 2023-03-06: 1 mg via INTRAVENOUS

## 2023-03-06 MED ORDER — VERAPAMIL HCL 2.5 MG/ML IV SOLN
INTRAVENOUS | Status: AC
  Filled 2023-03-06: qty 2

## 2023-03-06 SURGICAL SUPPLY — 11 items
CATH INFINITI JR4 5F (CATHETERS) IMPLANT
CATH OPTITORQUE TIG 4.0 5F (CATHETERS) IMPLANT
DEVICE RAD TR BAND REGULAR (VASCULAR PRODUCTS) IMPLANT
GLIDESHEATH SLEND SS 6F .021 (SHEATH) IMPLANT
GUIDEWIRE INQWIRE 1.5J.035X260 (WIRE) IMPLANT
INQWIRE 1.5J .035X260CM (WIRE) ×1
KIT HEART LEFT (KITS) ×1 IMPLANT
PACK CARDIAC CATHETERIZATION (CUSTOM PROCEDURE TRAY) ×1 IMPLANT
SHEATH PROBE COVER 6X72 (BAG) IMPLANT
TRANSDUCER W/STOPCOCK (MISCELLANEOUS) ×1 IMPLANT
TUBING CIL FLEX 10 FLL-RA (TUBING) ×1 IMPLANT

## 2023-03-06 NOTE — ED Notes (Signed)
ED TO INPATIENT HANDOFF REPORT  ED Nurse Name and Phone #:  Theophilus Bones [phone]  S Name/Age/Gender Shelby Vazquez 72 y.o. female Room/Bed: 004C/004C  Code Status   Code Status: Full Code  Home/SNF/Other Home Patient oriented to: self, place, time, and situation Is this baseline? Yes   Triage Complete: Triage complete  Chief Complaint NSTEMI (non-ST elevated myocardial infarction) [facility]) [I21.4]  Triage Note Pt arrives POV with complaints of centralized chest pain and bil arm weakness that started an hour prior to arrival. Pt denies pain radiation. Pt arrives to triage in tears and feeling weak from the pain and nauseous.   Allergies Allergies  Allergen Reactions   Augmentin [Amoxicillin-Pot Clavulanate] Other (See Comments)    Severe diarrhea, rash, worsened acid reflux   Erythromycin Rash    Level of Care/Admitting Diagnosis ED Disposition     ED Disposition  Admit   Condition  --   Comment  Hospital Area: [facility] [[id number]]  Level of Care: Telemetry Cardiac [[id number]]  May admit patient to [facility] or [facility] if equivalent level of care is available:: No  Covid Evaluation: Asymptomatic - no recent exposure (last 10 days) testing not required  Diagnosis: NSTEMI (non-ST elevated myocardial infarction) [facility]) [[id number]]  Admitting Physician: [place], PETER M [[id number]]  Attending Physician: [place], PETER M [phone]  Certification:: I certify this patient will need inpatient services for at least 2 midnights  Estimated Length of Stay: 2          B Medical/Surgery History Past Medical History:  Diagnosis Date   Abdominal aortic atherosclerosis (HCC) 10/2014   by CT scan   B12 deficiency    Chronic anxiety    Chronic cough    cyclical cough syndrome   Complication of anesthesia 9 yrs ago   awake and in severe pain with colonoscopy   Concussion    Diet-controlled diabetes mellitus (HCC) 02/22/2017   New dx 02/2017   GERD (gastroesophageal reflux  disease)    severe leading to chronic cough and hoarseness   History of Epstein-Barr virus infection 2012   Hyperlipidemia    Hypertension    IBS (irritable bowel syndrome)    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, PB-8 probiotic, healthy diet and exercise   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   By CT 09/2017 & 09/2018 - max diameter 4cm f/u annually    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   had surgery with 5 days of chemotherapy   Osteopenia 01/2015   T -0.8 spine, -1.7 hip   Pyelonephritis due to Escherichia coli 07/16/2017   Uterine prolapse    Vocal cord paralysis, bilateral partial 2001   evaluated at [facility] - did not require surgery   Past Surgical History:  Procedure Laterality Date   BUNIONECTOMY     [place] '2000   CATARACT EXTRACTION     right 01/2010, left may 2011   COLONOSCOPY  03/2018   diverticulosis, rpt 5 yrs ([name])   COLONOSCOPY WITH PROPOFOL N/A 03/31/2014   sessile serrated adenomas, sigmoid diverticulosis, rpt 3 yrs Bernette Redbird V, MD)   CORONARY CALCIUM SCORE-CORONARY CTA  09/2017   Calcium score 53 . Normal right dominant coronary artery system with mild onset of CAD. Aneurysmal dilation of the ascending aorta with maximum diameter diameter of 4.0-3.8 centimeter. -Recommend annual CT/MRA December 2018   DILATION AND CURETTAGE OF UTERUS  '77   for molar pregnancy excision   ESOPHAGOGASTRODUODENOSCOPY N/A 03/31/2014   mult gastric  polyps, mild mucosal hemorrhages; ESOPHAGOGASTRODUODENOSCOPY (EGD);  Surgeon: Florencia Reasons, MD   ESOPHAGOGASTRODUODENOSCOPY ENDOSCOPY  april 2014   INTERSTIM IMPLANT PLACEMENT  2018   toe surgery Right 09/2018   TRANSTHORACIC ECHOCARDIOGRAM  09/2017   Normal LV size and function. EF 55-60%. GR 1 DD. No regional wall motion abnormalities. No significant valvular abnormalities.     A IV Location/Drains/Wounds Patient Lines/Drains/Airways Status     Active Line/Drains/Airways     Name Placement date  Placement time Site Days   Peripheral IV 03/05/23 20 G Right Antecubital 03/05/23  1449  Antecubital  1   Airway 03/31/14  1353  -- 3262            Intake/Output Last 24 hours  Intake/Output Summary (Last 24 hours) at 03/06/2023 0754 Last data filed at 03/06/2023 0537 Gross per 24 hour  Intake 250.95 ml  Output --  Net 250.95 ml    Labs/Imaging Results for orders placed or performed during the hospital encounter of 03/05/23 (from the past 48 hour(s))  Basic metabolic panel     Status: Abnormal   Collection Time: 03/05/23  2:46 PM  Result Value Ref Range   Sodium 134 (L) 135 - 145 mmol/L   Potassium 3.9 3.5 - 5.1 mmol/L   Chloride 99 98 - 111 mmol/L   CO2 23 22 - 32 mmol/L   Glucose, Bld 117 (H) 70 - 99 mg/dL    Comment: Glucose reference range applies only to samples taken after fasting for at least 8 hours.   BUN 12 8 - 23 mg/dL   Creatinine, Ser 3.08 0.44 - 1.00 mg/dL   Calcium 9.8 8.9 - 65.7 mg/dL   GFR, Estimated >84 >69 mL/min    Comment: (NOTE) Calculated using the CKD-EPI Creatinine Equation (2021)    Anion gap 12 5 - 15    Comment: Performed at [facility], [address], [place], [place] [address]  CBC     Status: None   Collection Time: 03/05/23  2:46 PM  Result Value Ref Range   WBC 7.8 4.0 - 10.5 K/uL   RBC 4.42 3.87 - 5.11 MIL/uL   Hemoglobin 13.4 12.0 - 15.0 g/dL   HCT 84.1 32.4 - 40.1 %   MCV 92.3 80.0 - 100.0 fL   MCH 30.3 26.0 - 34.0 pg   MCHC 32.8 30.0 - 36.0 g/dL   RDW 02.7 25.3 - 66.4 %   Platelets 310 150 - 400 K/uL   nRBC 0.0 0.0 - 0.2 %    Comment: Performed at [facility], [address], [place], [place] [address]  Troponin I (High Sensitivity)     Status: Abnormal   Collection Time: 03/05/23  2:46 PM  Result Value Ref Range   Troponin I (High Sensitivity) 437 (HH) <18 ng/L    Comment: CRITICAL RESULT CALLED TO, READ BACK BY AND VERIFIED WITH Josefa Half RN 03/05/2023 1608 BNUNNRY (NOTE) Elevated high sensitivity troponin  I (hsTnI) values and significant  changes across serial measurements may suggest ACS but many other  chronic and acute conditions are known to elevate hsTnI results.  Refer to the "Links" section for chest pain algorithms and additional  guidance. Performed at [facility], [address] [place], [place] [address]   Troponin I (High Sensitivity)     Status: Abnormal   Collection Time: 03/05/23  4:22 PM  Result Value Ref Range   Troponin I (High Sensitivity) 755 (HH) <18 ng/L  Comment: CRITICAL VALUE NOTED. VALUE IS CONSISTENT WITH PREVIOUSLY REPORTED/CALLED VALUE (NOTE) Elevated high sensitivity troponin I (hsTnI) values and significant  changes across serial measurements may suggest ACS but many other  chronic and acute conditions are known to elevate hsTnI results.  Refer to the "Links" section for chest pain algorithms and additional  guidance. Performed at [facility], [address] [place], [place] [address]   Hepatic function panel     Status: Abnormal   Collection Time: 03/05/23  4:22 PM  Result Value Ref Range   Total Protein 6.2 (L) 6.5 - 8.1 g/dL   Albumin 3.8 3.5 - 5.0 g/dL   AST 30 15 - 41 U/L   ALT 20 0 - 44 U/L   Alkaline Phosphatase 38 38 - 126 U/L   Total Bilirubin 0.6 0.3 - 1.2 mg/dL   Bilirubin, Direct 0.2 0.0 - 0.2 mg/dL   Indirect Bilirubin 0.4 0.3 - 0.9 mg/dL    Comment: Performed at [facility], [address], [place], [place] [address]  Lipase, blood     Status: None   Collection Time: 03/05/23  4:22 PM  Result Value Ref Range   Lipase 31 11 - 51 U/L    Comment: Performed at [facility], [address] [place], [place] [address]  Heparin level (unfractionated)     Status: Abnormal   Collection Time: 03/06/23  1:45 AM  Result Value Ref Range   Heparin Unfractionated 0.12 (L) 0.30 - 0.70 IU/mL    Comment: (NOTE) The clinical reportable range upper limit is being lowered to >1.10 to align with the FDA approved  guidance for the current laboratory assay.  If heparin results are below expected values, and patient dosage has  been confirmed, suggest follow up testing of antithrombin III levels. Performed at [facility], [address], [place], [place] [address]   Troponin I (High Sensitivity)     Status: Abnormal   Collection Time: 03/06/23  4:30 AM  Result Value Ref Range   Troponin I (High Sensitivity) 376 (HH) <18 ng/L    Comment: CRITICAL VALUE NOTED. VALUE IS CONSISTENT WITH PREVIOUSLY REPORTED/CALLED VALUE (NOTE) Elevated high sensitivity troponin I (hsTnI) values and significant  changes across serial measurements may suggest ACS but many other  chronic and acute conditions are known to elevate hsTnI results.  Refer to the "Links" section for chest pain algorithms and additional  guidance. Performed at [facility], [address], [place], [place] [address]   TSH     Status: Abnormal   Collection Time: 03/06/23  4:30 AM  Result Value Ref Range   TSH 4.870 (H) 0.350 - 4.500 uIU/mL    Comment: Performed by a 3rd Generation assay with a functional sensitivity of <=0.01 uIU/mL. Performed at [facility], [address] [place], [place] [address]   Hemoglobin A1c     Status: Abnormal   Collection Time: 03/06/23  4:30 AM  Result Value Ref Range   Hgb A1c MFr Bld 6.2 (H) 4.8 - 5.6 %    Comment: (NOTE) Pre diabetes:          5.7%-6.4%  Diabetes:              >6.4%  Glycemic control for   <7.0% adults with diabetes    Mean Plasma Glucose 131.24 mg/dL    Comment: Performed at [facility], [address], [place], [place] [address]  Lipid  panel     Status: Abnormal   Collection Time: 03/06/23  4:30 AM  Result Value Ref Range   Cholesterol 126 0 - 200 mg/dL   Triglycerides 88 <846 mg/dL   HDL 37 (L) >96 mg/dL   Total CHOL/HDL Ratio 3.4 RATIO   VLDL 18 0 - 40 mg/dL   LDL Cholesterol 71 0 - 99 mg/dL    Comment:        Total Cholesterol/HDL:CHD  Risk Coronary Heart Disease Risk Table                     Men   Women  1/2 Average Risk   3.4   3.3  Average Risk       5.0   4.4  2 X Average Risk   9.6   7.1  3 X Average Risk  23.4   11.0        Use the calculated Patient Ratio above and the CHD Risk Table to determine the patient's CHD Risk.        ATP III CLASSIFICATION (LDL):  <100     mg/dL   Optimal  295-284  mg/dL   Near or Above                    Optimal  130-159  mg/dL   Borderline  132-440  mg/dL   High  >102     mg/dL   Very High Performed at [facility], [address] [place], [place] [address]    DG Chest 2 View  Result Date: 03/05/2023 CLINICAL DATA:  Chest pain EXAM: CHEST - 2 VIEW COMPARISON:  Chest x-ray 08/04/2017 FINDINGS: The heart size and mediastinal contours are within normal limits. Both lungs are clear. The visualized skeletal structures are unremarkable. IMPRESSION: No active cardiopulmonary disease. Electronically Signed   By: Darliss Cheney M.D.   On: 03/05/2023 15:08    Pending Labs Unresulted Labs (From admission, onward)     Start     Ordered   03/07/23 0500  CBC  Daily,   R      03/06/23 0308   03/06/23 1200  Heparin level (unfractionated)  Once-Timed,   TIMED        03/06/23 0308   03/06/23 0730  T4, free  Once,   R        03/06/23 0729   03/06/23 0730  T3, free  Once,   R        03/06/23 0729   03/06/23 0500  Lipoprotein A (LPA)  Tomorrow morning,   R        03/06/23 0314            Vitals/Pain Today's Vitals   03/06/23 0055 03/06/23 0200 03/06/23 0534 03/06/23 0600  BP:  104/63 118/70 115/72  Pulse:  94 86 85  Resp:  19 15 18   Temp: 98.7 F (37.1 C) 98.5 F (36.9 C) 98.7 F (37.1 C)   TempSrc: Oral Oral Oral   SpO2:  91% 95% 95%  Weight:      Height:      PainSc:        Isolation Precautions No active isolations  Medications Medications  heparin ADULT infusion 100 units/mL (25000 units/217mL) (950 Units/hr Intravenous Rate/Dose Change 03/06/23 0312)   losartan (COZAAR) tablet 50 mg (has no administration in time range)  metoprolol tartrate (LOPRESSOR) tablet 50 mg (0 mg Oral Hold 03/06/23 0328)  methenamine (MANDELAMINE) tablet 1  g (has no administration in time range)  rosuvastatin (CRESTOR) tablet 20 mg (has no administration in time range)  famotidine (PEPCID) tablet 40 mg (has no administration in time range)  pantoprazole (PROTONIX) EC tablet 40 mg (has no administration in time range)  psyllium (HYDROCIL/METAMUCIL) 1 packet (has no administration in time range)  calcium-vitamin D (OSCAL WITH D) 500-5 MG-MCG per tablet 1 tablet (has no administration in time range)  aspirin EC tablet 81 mg (has no administration in time range)  nitroGLYCERIN (NITROSTAT) SL tablet 0.4 mg (has no administration in time range)  acetaminophen (TYLENOL) tablet 650 mg (has no administration in time range)  ondansetron (ZOFRAN) injection 4 mg (has no administration in time range)  sodium chloride flush (NS) 0.9 % injection 3 mL (3 mLs Intravenous Given 03/06/23 0337)  sodium chloride flush (NS) 0.9 % injection 3 mL (has no administration in time range)  0.9 %  sodium chloride infusion (has no administration in time range)  0.9% sodium chloride infusion (0 mL/kg/hr  71.7 kg Intravenous Stopped 03/06/23 0537)    Followed by  0.9% sodium chloride infusion (1 mL/kg/hr  71.7 kg Intravenous New Bag/Given 03/06/23 0537)  ondansetron (ZOFRAN) injection 4 mg (4 mg Intravenous Given 03/05/23 1449)  aspirin chewable tablet 243 mg (243 mg Oral Given 03/05/23 1702)  heparin bolus via infusion 4,000 Units (4,000 Units Intravenous Bolus from Bag 03/05/23 1810)  aspirin chewable tablet 81 mg (81 mg Oral Given 03/06/23 0633)  heparin bolus via infusion 2,000 Units (2,000 Units Intravenous Bolus from Bag 03/06/23 0313)    Mobility walks     Focused Assessments Cardiac Assessment Handoff:  Cardiac Rhythm: Normal sinus rhythm Lab Results  Component Value Date   CKTOTAL  232 (H) 12/03/2011   No results found for: "DDIMER" Does the Patient currently have chest pain? No    R Recommendations: See Admitting Provider Note  Report given to:   Additional Notes:

## 2023-03-06 NOTE — Interval H&P Note (Signed)
Cath Lab Visit (complete for each Cath Lab visit)  Clinical Evaluation Leading to the Procedure:   ACS: Yes.    Non-ACS:    Anginal Classification: CCS III  Anti-ischemic medical therapy: Minimal Therapy (1 class of medications)  Non-Invasive Test Results: No non-invasive testing performed  Prior CABG: No previous CABG      History and Physical Interval Note:  03/06/2023 2:09 PM  Shelby Vazquez  has presented today for surgery, with the diagnosis of nstemi.  The various methods of treatment have been discussed with the patient and family. After consideration of risks, benefits and other options for treatment, the patient has consented to  Procedure(s): LEFT HEART CATH AND CORONARY ANGIOGRAPHY (N/A) as a surgical intervention.  The patient's history has been reviewed, patient examined, no change in status, stable for surgery.  I have reviewed the patient's chart and labs.  Questions were answered to the patient's satisfaction.     Nicki Guadalajara

## 2023-03-06 NOTE — Progress Notes (Signed)
  Echocardiogram 2D Echocardiogram has been performed.  Shelby Vazquez 03/06/2023, 8:27 AM

## 2023-03-06 NOTE — H&P (View-Only) (Signed)
 Rounding Note    Patient Name: Shelby Vazquez Date of Encounter: 03/06/2023  [facility] Cardiologist: David Harding, MD   Subjective   Doing well this am. No chest pain or SOB. Had mild CP 2/10 at 10:30 last night but resolved without further treatment.   Inpatient Medications    Scheduled Meds:  aspirin EC  81 mg Oral Daily   calcium-vitamin D  1 tablet Oral Daily   famotidine  40 mg Oral QHS   losartan  50 mg Oral Daily   methenamine  1 g Oral BID WC   metoprolol tartrate  50 mg Oral BID   pantoprazole  40 mg Oral Daily   psyllium  1 packet Oral Daily   rosuvastatin  20 mg Oral QPM   sodium chloride flush  3 mL Intravenous Q12H   Continuous Infusions:  sodium chloride     sodium chloride 1 mL/kg/hr (03/06/23 0537)   heparin 950 Units/hr (03/06/23 0312)   PRN Meds: sodium chloride, acetaminophen, nitroGLYCERIN, ondansetron (ZOFRAN) IV, sodium chloride flush   Vital Signs    Vitals:   03/06/23 0055 03/06/23 0200 03/06/23 0534 03/06/23 0600  BP:  104/63 118/70 115/72  Pulse:  94 86 85  Resp:  19 15 18  Temp: 98.7 F (37.1 C) 98.5 F (36.9 C) 98.7 F (37.1 C)   TempSrc: Oral Oral Oral   SpO2:  91% 95% 95%  Weight:      Height:        Intake/Output Summary (Last 24 hours) at 03/06/2023 0725 Last data filed at 03/06/2023 0537 Gross per 24 hour  Intake 250.95 ml  Output --  Net 250.95 ml      03/05/2023    1:50 PM 12/20/2022   10:03 AM 07/09/2022   11:13 AM  Last 3 Weights  Weight (lbs) 158 lb 158 lb 159 lb  Weight (kg) 71.668 kg 71.668 kg 72.122 kg      Telemetry    NSR - Personally Reviewed  ECG    Pending this am - Personally Reviewed  Physical Exam   GEN: No acute distress.   Neck: No JVD Cardiac: RRR, no murmurs, rubs, or gallops.  Respiratory: Clear to auscultation bilaterally. GI: Soft, nontender, non-distended  MS: No edema; No deformity. Neuro:  Nonfocal  Psych: Normal affect   Labs    High Sensitivity Troponin:    Recent Labs  Lab 03/05/23 1446 03/05/23 1622 03/06/23 0430  TROPONINIHS 437* 755* 376*     Chemistry Recent Labs  Lab 03/05/23 1446 03/05/23 1622  NA 134*  --   K 3.9  --   CL 99  --   CO2 23  --   GLUCOSE 117*  --   BUN 12  --   CREATININE 0.92  --   CALCIUM 9.8  --   PROT  --  6.2*  ALBUMIN  --  3.8  AST  --  30  ALT  --  20  ALKPHOS  --  38  BILITOT  --  0.6  GFRNONAA >60  --   ANIONGAP 12  --     Lipids  Recent Labs  Lab 03/06/23 0430  CHOL 126  TRIG 88  HDL 37*  LDLCALC 71  CHOLHDL 3.4    Hematology Recent Labs  Lab 03/05/23 1446  WBC 7.8  RBC 4.42  HGB 13.4  HCT 40.8  MCV 92.3  MCH 30.3  MCHC 32.8  RDW 12.5  PLT 310     Thyroid  Recent Labs  Lab 03/06/23 0430  TSH 4.870*    BNPNo results for input(s): "BNP", "PROBNP" in the last 168 hours.  DDimer No results for input(s): "DDIMER" in the last 168 hours.   Radiology    DG Chest 2 View  Result Date: 03/05/2023 CLINICAL DATA:  Chest pain EXAM: CHEST - 2 VIEW COMPARISON:  Chest x-ray 08/04/2017 FINDINGS: The heart size and mediastinal contours are within normal limits. Both lungs are clear. The visualized skeletal structures are unremarkable. IMPRESSION: No active cardiopulmonary disease. Electronically Signed   By: Amy  Guttmann M.D.   On: 03/05/2023 15:08    Cardiac Studies   Echo 09/24/17: Study Conclusions   - Left ventricle: The cavity size was normal. Wall thickness was    normal. Systolic function was normal. The estimated ejection    fraction was in the range of 55% to 60%. Wall motion was normal;    there were no regional wall motion abnormalities. Doppler    parameters are consistent with abnormal left ventricular    relaxation (grade 1 diastolic dysfunction).  - Aortic valve: There was no stenosis.  - Aorta: Ascending aorta dimension: 42 mm.  - Ascending aorta: The ascending aorta was mildly dilated.  - Mitral valve: There was trivial regurgitation.  - Right ventricle: The  cavity size was normal. Systolic function    was normal.  - Tricuspid valve: Peak RV-RA gradient (S): 19 mm Hg.  - Pulmonary arteries: PA peak pressure: 27 mm Hg (S).  - Systemic veins: IVC measured 1.8 cm with < 50% respirophasic    variation, suggesting RA pressure 8 mmHg.    Coronary CTA 10/06/17: Cardiac/Coronary  CT   TECHNIQUE: The patient was scanned on a Phillips Force scanner.   FINDINGS: A 120 kV prospective scan was triggered in the descending thoracic aorta at 111 HU's. Axial non-contrast 3 mm slices were carried out through the heart. The data set was analyzed on a dedicated work station and scored using the Agatson method. Gantry rotation speed was 250 msecs and collimation was .6 mm. 5 mg of iv Metoprolol and 0.8 mg of sl NTG was given. The 3D data set was reconstructed in 5% intervals of the 67-82 % of the R-R cycle. Diastolic phases were analyzed on a dedicated work station using MPR, MIP and VRT modes. The patient received 80 cc of contrast.   Aorta: Aneurysmal dilatation of the ascending aorta with maximum diameter 40 x 38 mm. No calcifications. No dissection.   Aortic Valve:  Trileaflet.  No calcifications.   Coronary Arteries:  Normal coronary origin.  Right dominance.   RCA is a large dominant artery that gives rise to PDA and PLA. There is trivial plaque in the proximal RCA.   Left main is a short artery that gives rise to LAD and LCX arteries.   LAD is a large and long vessel that gives rise to one diagonal artery and wraps around the apex. There is mild calcified plaque in the proximal LAD with associated stenosis 25-50%   LCX is a non-dominant artery that gives rise to one OM1 branch. There is minimal calcified plaque in the proximal LCX artery with associated stenosis 0-25%.   Other findings:   Normal pulmonary vein drainage into the left atrium.   Normal let atrial appendage without a thrombus.   Normal size of the pulmonary artery.    IMPRESSION: 1. Coronary calcium score of 53. This was 72 percentile for age and sex matched   control.   2. Normal coronary origin with right dominance.   3. Mild non-obstructive CAD. Aggressive risk factor modification is recommended.   4. Aneurysmal dilatation of the ascending aorta with maximum diameter 40 x 38 mm. Annual follow up with CTA or MRA is recommended.     Electronically Signed   By: Katarina  Nelson   On: 10/06/2017 18:31    Patient Profile     72 y.o. female with history of CAD who is being seen 03/05/2023 for the evaluation of chest pain.   Assessment & Plan    NSTEMI. 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 Troponin elevated 437>>755>>376.  Ecg nonacute. Fortunately pain free now. On IV heparin. Continue metoprolol and ASA. Also on statin. Cardiac cath with possible PCI today.  Also check Echo.  HTN controlled HLD. LDL 71. Will increase statin dose Prediabetes. A1c 6.2%.  Mildly elevated TSH. Will check T3 and T4.   For questions or updates, please contact [facility] Please consult [email or web address] for contact info under        Signed, Abril Cappiello, MD  03/06/2023, 7:25 AM    

## 2023-03-06 NOTE — Progress Notes (Addendum)
Cath result discussed with Dr. Tresa Endo. She is on Lopressor 50mg  BID and losartan 50mg  daily at home. Confirmed with nurse she had not gotten yet today. Per d/w Dr. Tresa Endo, will d/c losartan and start Entresto 24/26mg  BID today and transition metoprolol to Toprol - he states he would not start 100mg  total right off the bat, so will do 50mg  daily starting tonight, with eye towards possibly consolidating in AM. He spoke with patient and family about cath results. Updated nurse on plan of care. In reviewing home med list, takes Librax at home. PharmD confirms we do not have this on formulary.

## 2023-03-06 NOTE — Progress Notes (Signed)
ANTICOAGULATION CONSULT NOTE  Pharmacy Consult for Heparin Indication: chest pain/ACS  Allergies  Allergen Reactions   Augmentin [Amoxicillin-Pot Clavulanate] Other (See Comments)    Severe diarrhea, rash, worsened acid reflux   Erythromycin Rash    Patient Measurements: Height: 5\' 4"  (162.6 cm) Weight: 71.7 kg (158 lb) IBW/kg (Calculated) : 54.7 Heparin Dosing Weight: 69.4 kg  Vital Signs: Temp: 98.6 F (37 C) (05/16 0958) Temp Source: Oral (05/16 0958) BP: 111/66 (05/16 1130) Pulse Rate: 87 (05/16 1130)  Labs: Recent Labs    03/05/23 1446 03/05/23 1622 03/06/23 0145 03/06/23 0430 03/06/23 1234  HGB 13.4  --   --   --   --   HCT 40.8  --   --   --   --   PLT 310  --   --   --   --   HEPARINUNFRC  --   --  0.12*  --  0.10*  CREATININE 0.92  --   --   --   --   TROPONINIHS 437* 755*  --  376*  --      Estimated Creatinine Clearance: 53.7 mL/min (by C-G formula based on SCr of 0.92 mg/dL).   Medical History: Past Medical History:  Diagnosis Date   Abdominal aortic atherosclerosis (HCC) 10/2014   by CT scan   B12 deficiency    Chronic anxiety    Chronic cough    cyclical cough syndrome   Complication of anesthesia 9 yrs ago   awake and in severe pain with colonoscopy   Concussion    Diet-controlled diabetes mellitus (HCC) 02/22/2017   New dx 02/2017   GERD (gastroesophageal reflux disease)    severe leading to chronic cough and hoarseness   History of Epstein-Barr virus infection 2012   Hyperlipidemia    Hypertension    IBS (irritable bowel syndrome)    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, PB-8 probiotic, healthy diet and exercise   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   By CT 09/2017 & 09/2018 - max diameter 4cm f/u annually    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   had surgery with 5 days of chemotherapy   Osteopenia 01/2015   T -0.8 spine, -1.7 hip   Pyelonephritis due to Escherichia coli 07/16/2017   Uterine prolapse    Vocal cord  paralysis, bilateral partial 2001   evaluated at [facility] - did not require surgery    Medications:  (Not in a hospital admission)  Scheduled:   aspirin EC  81 mg Oral Daily   calcium-vitamin D  1 tablet Oral Daily   famotidine  40 mg Oral QHS   losartan  50 mg Oral Daily   methenamine  1 g Oral BID WC   metoprolol tartrate  50 mg Oral BID   pantoprazole  40 mg Oral Daily   psyllium  1 packet Oral Daily   rosuvastatin  20 mg Oral QPM   sodium chloride flush  3 mL Intravenous Q12H   Infusions:   sodium chloride     sodium chloride 1 mL/kg/hr (03/06/23 0537)   heparin 950 Units/hr (03/06/23 0312)   PRN: sodium chloride, acetaminophen, nitroGLYCERIN, ondansetron (ZOFRAN) IV, sodium chloride flush  Assessment: 72 yof with a history of DM, GERD, HLD, HTN, IBS, mild ascending aorta dilation. Patient is presenting with acute onset chest heaviness which began suddenly with bilateral arm weakness and nausea . Heparin per pharmacy consult placed for chest pain/ACS. Trop peak at 755.   Hgb 13.4;  plt 310 on 5/15. Heparin level sub-therapeutic at 0.10 while on 950 u/hr. No issues with heparin per nursing.   Goal of Therapy:  Heparin level 0.3-0.7 units/ml Monitor platelets by anticoagulation protocol: Yes   Plan:  Increase heparin to 1100 u/hr.  F/u after cath for need to check level.  Continue to monitor H&H and platelets  Estill Batten, PharmD, BCCCP  Clinical Pharmacist Phone: [phone]

## 2023-03-06 NOTE — Progress Notes (Signed)
Rounding Note    Patient Name: Shelby Vazquez Date of Encounter: 03/06/2023  [facility] Cardiologist: Bryan Lemma, MD   Subjective   Doing well this am. No chest pain or SOB. Had mild CP 2/10 at 10:30 last night but resolved without further treatment.   Inpatient Medications    Scheduled Meds:  aspirin EC  81 mg Oral Daily   calcium-vitamin D  1 tablet Oral Daily   famotidine  40 mg Oral QHS   losartan  50 mg Oral Daily   methenamine  1 g Oral BID WC   metoprolol tartrate  50 mg Oral BID   pantoprazole  40 mg Oral Daily   psyllium  1 packet Oral Daily   rosuvastatin  20 mg Oral QPM   sodium chloride flush  3 mL Intravenous Q12H   Continuous Infusions:  sodium chloride     sodium chloride 1 mL/kg/hr (03/06/23 0537)   heparin 950 Units/hr (03/06/23 0312)   PRN Meds: sodium chloride, acetaminophen, nitroGLYCERIN, ondansetron (ZOFRAN) IV, sodium chloride flush   Vital Signs    Vitals:   03/06/23 0055 03/06/23 0200 03/06/23 0534 03/06/23 0600  BP:  104/63 118/70 115/72  Pulse:  94 86 85  Resp:  19 15 18   Temp: 98.7 F (37.1 C) 98.5 F (36.9 C) 98.7 F (37.1 C)   TempSrc: Oral Oral Oral   SpO2:  91% 95% 95%  Weight:      Height:        Intake/Output Summary (Last 24 hours) at 03/06/2023 0725 Last data filed at 03/06/2023 0537 Gross per 24 hour  Intake 250.95 ml  Output --  Net 250.95 ml      03/05/2023    1:50 PM 12/20/2022   10:03 AM 07/09/2022   11:13 AM  Last 3 Weights  Weight (lbs) 158 lb 158 lb 159 lb  Weight (kg) 71.668 kg 71.668 kg 72.122 kg      Telemetry    NSR - Personally Reviewed  ECG    Pending this am - Personally Reviewed  Physical Exam   GEN: No acute distress.   Neck: No JVD Cardiac: RRR, no murmurs, rubs, or gallops.  Respiratory: Clear to auscultation bilaterally. GI: Soft, nontender, non-distended  MS: No edema; No deformity. Neuro:  Nonfocal  Psych: Normal affect   Labs    High Sensitivity Troponin:    Recent Labs  Lab 03/05/23 1446 03/05/23 1622 03/06/23 0430  TROPONINIHS 437* 755* 376*     Chemistry Recent Labs  Lab 03/05/23 1446 03/05/23 1622  NA 134*  --   K 3.9  --   CL 99  --   CO2 23  --   GLUCOSE 117*  --   BUN 12  --   CREATININE 0.92  --   CALCIUM 9.8  --   PROT  --  6.2*  ALBUMIN  --  3.8  AST  --  30  ALT  --  20  ALKPHOS  --  38  BILITOT  --  0.6  GFRNONAA >60  --   ANIONGAP 12  --     Lipids  Recent Labs  Lab 03/06/23 0430  CHOL 126  TRIG 88  HDL 37*  LDLCALC 71  CHOLHDL 3.4    Hematology Recent Labs  Lab 03/05/23 1446  WBC 7.8  RBC 4.42  HGB 13.4  HCT 40.8  MCV 92.3  MCH 30.3  MCHC 32.8  RDW 12.5  PLT 310  Thyroid  Recent Labs  Lab 03/06/23 0430  TSH 4.870*    BNPNo results for input(s): "BNP", "PROBNP" in the last 168 hours.  DDimer No results for input(s): "DDIMER" in the last 168 hours.   Radiology    DG Chest 2 View  Result Date: 03/05/2023 CLINICAL DATA:  Chest pain EXAM: CHEST - 2 VIEW COMPARISON:  Chest x-ray 08/04/2017 FINDINGS: The heart size and mediastinal contours are within normal limits. Both lungs are clear. The visualized skeletal structures are unremarkable. IMPRESSION: No active cardiopulmonary disease. Electronically Signed   By: Darliss Cheney M.D.   On: 03/05/2023 15:08    Cardiac Studies   Echo 09/24/17: Study Conclusions   - Left ventricle: The cavity size was normal. Wall thickness was    normal. Systolic function was normal. The estimated ejection    fraction was in the range of 55% to 60%. Wall motion was normal;    there were no regional wall motion abnormalities. Doppler    parameters are consistent with abnormal left ventricular    relaxation (grade 1 diastolic dysfunction).  - Aortic valve: There was no stenosis.  - Aorta: Ascending aorta dimension: 42 mm.  - Ascending aorta: The ascending aorta was mildly dilated.  - Mitral valve: There was trivial regurgitation.  - Right ventricle: The  cavity size was normal. Systolic function    was normal.  - Tricuspid valve: Peak RV-RA gradient (S): 19 mm Hg.  - Pulmonary arteries: PA peak pressure: 27 mm Hg (S).  - Systemic veins: IVC measured 1.8 cm with < 50% respirophasic    variation, suggesting RA pressure 8 mmHg.    Coronary CTA 10/06/17: Cardiac/Coronary  CT   TECHNIQUE: The patient was scanned on a [employer].   FINDINGS: A 120 kV prospective scan was triggered in the descending thoracic aorta at 111 HU's. Axial non-contrast 3 mm slices were carried out through the heart. The data set was analyzed on a dedicated work station and scored using the Agatson method. Gantry rotation speed was 250 msecs and collimation was .6 mm. 5 mg of iv Metoprolol and 0.8 mg of sl NTG was given. The 3D data set was reconstructed in 5% intervals of the 67-82 % of the R-R cycle. Diastolic phases were analyzed on a dedicated work station using MPR, MIP and VRT modes. The patient received 80 cc of contrast.   Aorta: Aneurysmal dilatation of the ascending aorta with maximum diameter 40 x 38 mm. No calcifications. No dissection.   Aortic Valve:  Trileaflet.  No calcifications.   Coronary Arteries:  Normal coronary origin.  Right dominance.   RCA is a large dominant artery that gives rise to PDA and PLA. There is trivial plaque in the proximal RCA.   Left main is a short artery that gives rise to LAD and LCX arteries.   LAD is a large and long vessel that gives rise to one diagonal artery and wraps around the apex. There is mild calcified plaque in the proximal LAD with associated stenosis 25-50%   LCX is a non-dominant artery that gives rise to one OM1 branch. There is minimal calcified plaque in the proximal LCX artery with associated stenosis 0-25%.   Other findings:   Normal pulmonary vein drainage into the left atrium.   Normal let atrial appendage without a thrombus.   Normal size of the pulmonary artery.    IMPRESSION: 1. Coronary calcium score of 53. This was 23 percentile for age and sex matched  control.   2. Normal coronary origin with right dominance.   3. Mild non-obstructive CAD. Aggressive risk factor modification is recommended.   4. Aneurysmal dilatation of the ascending aorta with maximum diameter 40 x 38 mm. Annual follow up with CTA or MRA is recommended.     Electronically Signed   By: Tobias Alexander   On: 10/06/2017 18:31    Patient Profile     72 y.o. female with history of CAD who is being seen 03/05/2023 for the evaluation of chest pain.   Assessment & Plan    NSTEMI. 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 Troponin elevated 437>>755>>376.  Ecg nonacute. Fortunately pain free now. On IV heparin. Continue metoprolol and ASA. Also on statin. Cardiac cath with possible PCI today.  Also check Echo.  HTN controlled HLD. LDL 71. Will increase statin dose Prediabetes. A1c 6.2%.  Mildly elevated TSH. Will check T3 and T4.   For questions or updates, please contact [facility] Please consult [email or web address] for contact info under        Signed, Abdoulie Tierce [place], MD  03/06/2023, 7:25 AM

## 2023-03-06 NOTE — Progress Notes (Signed)
ANTICOAGULATION CONSULT NOTE  Pharmacy Consult for Heparin Indication: chest pain/ACS  Allergies  Allergen Reactions   Augmentin [Amoxicillin-Pot Clavulanate] Other (See Comments)    Severe diarrhea, rash, worsened acid reflux   Erythromycin Rash    Patient Measurements: Height: 5\' 4"  (162.6 cm) Weight: 71.7 kg (158 lb) IBW/kg (Calculated) : 54.7 Heparin Dosing Weight: 69.4 kg  Vital Signs: Temp: 98.5 F (36.9 C) (05/16 0200) Temp Source: Oral (05/16 0200) BP: 104/63 (05/16 0200) Pulse Rate: 94 (05/16 0200)  Labs: Recent Labs    03/05/23 1446 03/05/23 1622 03/06/23 0145  HGB 13.4  --   --   HCT 40.8  --   --   PLT 310  --   --   HEPARINUNFRC  --   --  0.12*  CREATININE 0.92  --   --   TROPONINIHS 437* 755*  --      Estimated Creatinine Clearance: 53.7 mL/min (by C-G formula based on SCr of 0.92 mg/dL).   Medical History: Past Medical History:  Diagnosis Date   Abdominal aortic atherosclerosis (HCC) 10/2014   by CT scan   B12 deficiency    Chronic anxiety    Chronic cough    cyclical cough syndrome   Complication of anesthesia 9 yrs ago   awake and in severe pain with colonoscopy   Concussion    Diet-controlled diabetes mellitus (HCC) 02/22/2017   New dx 02/2017   GERD (gastroesophageal reflux disease)    severe leading to chronic cough and hoarseness   History of Epstein-Barr virus infection 2012   Hyperlipidemia    Hypertension    IBS (irritable bowel syndrome)    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, PB-8 probiotic, healthy diet and exercise   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   By CT 09/2017 & 09/2018 - max diameter 4cm f/u annually    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 Patient is presenting with acute onset chest heaviness which began suddenly with bilateral arm weakness and nausea . Heparin per pharmacy consult placed for chest pain/ACS.  Patient is not on anticoagulation prior to arrival.  Hgb 13.4; plt 310  5/16 AM update:  Heparin level sub-therapeutic   Goal of Therapy:  Heparin level 0.3-0.7 units/ml Monitor platelets by anticoagulation protocol: Yes   Plan:  Give IV heparin 2000 units bolus x 1 Inc heparin infusion to 950 units/hr Check anti-Xa level in 8 hours and daily while on heparin Continue to monitor H&H and platelets  Abran Duke, PharmD, BCPS Clinical Pharmacist Phone: [phone]

## 2023-03-07 ENCOUNTER — Telehealth (HOSPITAL_BASED_OUTPATIENT_CLINIC_OR_DEPARTMENT_OTHER): Payer: Self-pay

## 2023-03-07 ENCOUNTER — Encounter (HOSPITAL_COMMUNITY): Payer: Self-pay | Admitting: Cardiovascular Disease

## 2023-03-07 ENCOUNTER — Other Ambulatory Visit (HOSPITAL_COMMUNITY): Payer: Self-pay

## 2023-03-07 ENCOUNTER — Telehealth: Payer: Self-pay | Admitting: Cardiology

## 2023-03-07 DIAGNOSIS — I5181 Takotsubo syndrome: Secondary | ICD-10-CM

## 2023-03-07 DIAGNOSIS — I77819 Aortic ectasia, unspecified site: Secondary | ICD-10-CM | POA: Insufficient documentation

## 2023-03-07 LAB — CBC
HCT: 38.7 % (ref 36.0–46.0)
Hemoglobin: 13 g/dL (ref 12.0–15.0)
MCH: 30.3 pg (ref 26.0–34.0)
MCHC: 33.6 g/dL (ref 30.0–36.0)
MCV: 90.2 fL (ref 80.0–100.0)
Platelets: 290 10*3/uL (ref 150–400)
RBC: 4.29 MIL/uL (ref 3.87–5.11)
RDW: 12.6 % (ref 11.5–15.5)
WBC: 7 10*3/uL (ref 4.0–10.5)
nRBC: 0 % (ref 0.0–0.2)

## 2023-03-07 LAB — BASIC METABOLIC PANEL
Anion gap: 8 (ref 5–15)
BUN: 9 mg/dL (ref 8–23)
CO2: 25 mmol/L (ref 22–32)
Calcium: 9.4 mg/dL (ref 8.9–10.3)
Chloride: 102 mmol/L (ref 98–111)
Creatinine, Ser: 0.81 mg/dL (ref 0.44–1.00)
GFR, Estimated: >60 mL/min (ref >60)
Glucose, Bld: 123 mg/dL — ABNORMAL HIGH (ref 70–99)
Potassium: 3.6 mmol/L (ref 3.5–5.1)
Sodium: 135 mmol/L (ref 135–145)

## 2023-03-07 LAB — LIPOPROTEIN A (LPA): Lipoprotein (a): 9.8 nmol/L (ref <75.0)

## 2023-03-07 MED ORDER — SPIRONOLACTONE 12.5 MG HALF TABLET
12.5000 mg | ORAL_TABLET | Freq: Every day | ORAL | Status: DC
  Administered 2023-03-07: 12.5 mg via ORAL
  Filled 2023-03-07: qty 1

## 2023-03-07 MED ORDER — METOPROLOL SUCCINATE ER 100 MG PO TB24
100.0000 mg | ORAL_TABLET | Freq: Every day | ORAL | 3 refills | Status: DC
  Filled 2023-03-07: qty 30, 30d supply, fill #0

## 2023-03-07 MED ORDER — METOPROLOL SUCCINATE ER 100 MG PO TB24: 100.0000 mg | ORAL_TABLET | Freq: Every day | ORAL | Status: DC

## 2023-03-07 MED ORDER — SPIRONOLACTONE 25 MG PO TABS
12.5000 mg | ORAL_TABLET | Freq: Every day | ORAL | 3 refills | Status: DC
  Filled 2023-03-07: qty 15, 30d supply, fill #0

## 2023-03-07 MED ORDER — SACUBITRIL-VALSARTAN 24-26 MG PO TABS
1.0000 | ORAL_TABLET | Freq: Two times a day (BID) | ORAL | 3 refills | Status: DC
  Filled 2023-03-07: qty 60, 30d supply, fill #0

## 2023-03-07 MED ORDER — METOPROLOL SUCCINATE ER 50 MG PO TB24
50.0000 mg | ORAL_TABLET | Freq: Once | ORAL | Status: AC
  Administered 2023-03-07: 50 mg via ORAL
  Filled 2023-03-07: qty 1

## 2023-03-07 NOTE — Telephone Encounter (Signed)
   Transition of Care Follow-up Phone Call Request    Patient Name: CARNEISHA AKANDE Date of Birth: December 12, 1950 Date of Encounter: 03/07/2023  Primary Care Provider:  Eustaquio Boyden, MD Primary Cardiologist:  Bryan Lemma, MD  [name] has been scheduled for a transition of care follow up appointment with a [facility] provider: With heart failure impact clinic. On 03/25/2023.    Please reach out to [name] within 48 hours to confirm appointment.  Abagail Kitchens, PA-C  03/07/2023, 11:28 AM

## 2023-03-07 NOTE — Discharge Summary (Signed)
Discharge Summary    Patient ID: Shelby Vazquez MRN: [id number]; DOB: 11-25-1950  Admit date: 03/05/2023 Discharge date: 03/07/2023  PCP:  Eustaquio Boyden, MD   [facility] Providers Cardiologist:  Bryan Lemma, MD   {    Discharge Diagnoses    Principal Problem:   Stress-induced cardiomyopathy Active Problems:   Hyperlipidemia   Essential hypertension   Abdominal aortic atherosclerosis (HCC)   Coronary artery disease, non-occlusive - Cor CA Score 53.  MIldCAD only.   Aortic dilatation (HCC)    Diagnostic Studies/Procedures    Left heart catheterization on 03/06/2023  LV end diastolic pressure is mildly elevated.   Normal coronary arteries.   Moderately severe LV function with wall motion abnormality consistent with Takotsubo cardiomyopathy.  EF estimate approximately 35%; LVEDP 17 mmHg.   RECOMMENDATION: Guideline directed medical therapy for reduced LV function.  Consider transition from losartan to Entresto, change to metoprolol succinate or carvedilol for beta-blocker therapy, and consider subsequent initiation of Jardiance and spironolactone.  Echocardiogram 03/06/2023 1. Left ventricular ejection fraction, by estimation, is 35 to 40%. The  left ventricle has moderately decreased function. The left ventricle  demonstrates regional wall motion abnormalities (see scoring  diagram/findings for description). Left ventricular   diastolic parameters are consistent with Grade I diastolic dysfunction  (impaired relaxation). There is akinesis of the left ventricular, mid  inferoseptal wall and anteroseptal wall. There is hypokinesis of the left  ventricular, entire inferior wall.  There is akinesis of the left ventricular, apical septal wall and anterior  wall.   2. Right ventricular systolic function is normal. The right ventricular  size is normal.   3. The mitral valve is normal in structure. Trivial mitral valve  regurgitation. No evidence of mitral  stenosis.   4. The aortic valve is normal in structure. Aortic valve regurgitation is  trivial. No aortic stenosis is present. Aortic valve area, by VTI measures  2.94 cm. Aortic valve mean gradient measures 3.0 mmHg. Aortic valve Vmax  measures 1.09 m/s.   5. Aortic dilatation noted. There is mild dilatation of the ascending  aorta, measuring 41 mm.   6. The inferior vena cava is normal in size with greater than 50%  respiratory variability, suggesting right atrial pressure of 3 mmHg.   _____________   History of Present Illness     Shelby Vazquez is a 72 y.o. female with  hypertension, aortic atherosclerosis, hyperlipidemia, palpitations, coronary artery disease (coronary CTA in 2018 with mild 0nonobstructive CAD), TIA, iron deficiency anemia, GERD.  Last echocardiogram was in 2018 around the time of her coronary CT. LVEF at that time was normal at 55 to 60% with grade 1 diastolic dysfunction.  She was last seen in March and had been noted to be doing good from a cardiac standpoint.  Patient was admitted yesterday due to concerns of NSTEMI.  She states that she started to develop mid to lower sternal chest pain after running a couple of errands that began to become more severe and with accompanied arm weakness and nausea.  She drove to the ED and received 3 sublingual nitroglycerin with relief. EKG showing normal sinus rhythm without any ST-T wave changes.  She had elevated troponins of 437 that increased to 755.  She was started on IV heparin and underwent cardiac catherization    Hospital Course     Consultants:    Takotsubo stress cardiomyopathy Patient initially presented with concerns of NSTEMI however after catheterization results now suggestive of stress  cardiomyopathy and with normal patent coronaries. Patient does admit to new life stressors that include deaths of multiple family and friends recently. Her echo indicated reduced LVEF of 35 to 40% with regional wall motion  abnormalities and grade 1 diastolic dysfunction.  She had akinesis of the left ventricular, mid inferior septal wall and anteroseptal wall. Plan to continue to maximize GDMT PTA was on Lopressor 50 mg twice daily and losartan 50 mg daily at home.  We have switched her losartan to Entresto 24/26 mg twice daily and transitioned from Lopressor to Toprol-XL 50mg , but will be discharged on 100mg  dose.  Will also add spironolactone 12.5 mg daily. Phase 1 cardiac rehab ordered Would not be a candidate for SGLT2 inhibitor due to previous history of bladder infections and sepsis. Will need follow-up echocardiogram within the next 3 months. Will also need to recheck BMET with new addition of Entresto and spironolactone within 1 week. This has been arranged.  CAD, nonobstructive disease Hyperlipidemia Left heart catheterization indicated normal coronary arteries. Continue rosuvastatin 20 mg daily and aspirin 81 mg  Aortic dilatation (to see 41 mmHg) Continue to monitor stable. Consider follow up echo in 1 year.   Patient has been seen and evaluated by myself and Dr. Swaz[place]and.  She has been deemed stable for discharge.  Right radial cath site free of any signs of infection.  She will follow-up outpatient.  Did the patient have an acute coronary syndrome (MI, NSTEMI, STEMI, etc) this admission?:  No                               Did the patient have a percutaneous coronary intervention (stent / angioplasty)?:  No.        The patient will be scheduled for a TOC follow up appointment in 14 days.  A message has been sent to the [facility] and Scheduling Pool at the office where the patient should be seen for follow up.  _____________  Discharge Vitals Blood pressure 122/73, pulse 86, temperature 97.8 F (36.6 C), temperature source Oral, resp. rate 19, height 5\' 4"  (1.626 m), weight 71.7 kg, SpO2 98 %.  Filed Weights   03/05/23 1350  Weight: 71.7 kg    Labs & Radiologic Studies    CBC Recent Labs     03/05/23 1446 03/07/23 0330  WBC 7.8 7.0  HGB 13.4 13.0  HCT 40.8 38.7  MCV 92.3 90.2  PLT 310 290   Basic Metabolic Panel Recent Labs    16/10/96 1446 03/07/23 0330  NA 134* 135  K 3.9 3.6  CL 99 102  CO2 23 25  GLUCOSE 117* 123*  BUN 12 9  CREATININE 0.92 0.81  CALCIUM 9.8 9.4   Liver Function Tests Recent Labs    03/05/23 1622  AST 30  ALT 20  ALKPHOS 38  BILITOT 0.6  PROT 6.2*  ALBUMIN 3.8   Recent Labs    03/05/23 1622  LIPASE 31   High Sensitivity Troponin:   Recent Labs  Lab 03/05/23 1446 03/05/23 1622 03/06/23 0430  TROPONINIHS 437* 755* 376*    BNP Invalid input(s): "POCBNP" D-Dimer No results for input(s): "DDIMER" in the last 72 hours. Hemoglobin A1C Recent Labs    03/06/23 0430  HGBA1C 6.2*   Fasting Lipid Panel Recent Labs    03/06/23 0430  CHOL 126  HDL 37*  LDLCALC 71  TRIG 88  CHOLHDL 3.4   Thyroid Function  Tests Recent Labs    03/06/23 0430  TSH 4.870*   _____________  CARDIAC CATHETERIZATION  Result Date: 03/06/2023   LV end diastolic pressure is mildly elevated. Normal coronary arteries. Moderately severe LV function with wall motion abnormality consistent with Takotsubo cardiomyopathy.  EF estimate approximately 35%; LVEDP 17 mmHg. RECOMMENDATION: Guideline directed medical therapy for reduced LV function.  Consider transition from losartan to Entresto, change to metoprolol succinate or carvedilol for beta-blocker therapy, and consider subsequent initiation of Jardiance and spironolactone.   ECHOCARDIOGRAM COMPLETE  Result Date: 03/06/2023    ECHOCARDIOGRAM REPORT   [name] [facility] Date of Exam: 03/06/2023 Medical Rec #:  [id number]      Height:       64.0 in Accession #:    [id number]     Weight:       158.0 lb Date of Birth:  1951/02/09      BSA:          1.770 m Patient Age:    72 years       BP:           104/63 mmHg Patient Gender: F              HR:           80 bpm. Exam Location:  Inpatient  Procedure: 2D Echo, Cardiac Doppler, Color Doppler and Intracardiac            Opacification Agent Indications:    Acute Ischemic heart disease  History:        Patient has prior history of Echocardiogram examinations, most                 recent 09/24/2017.  Sonographer:    Lucy Antigua Referring Phys: [phone] PETER M Swaz[place] IMPRESSIONS  1. Left ventricular ejection fraction, by estimation, is 35 to 40%. The left ventricle has moderately decreased function. The left ventricle demonstrates regional wall motion abnormalities (see scoring diagram/findings for description). Left ventricular  diastolic parameters are consistent with Grade I diastolic dysfunction (impaired relaxation). There is akinesis of the left ventricular, mid inferoseptal wall and anteroseptal wall. There is hypokinesis of the left ventricular, entire inferior wall. There is akinesis of the left ventricular, apical septal wall and anterior wall.  2. Right ventricular systolic function is normal. The right ventricular size is normal.  3. The mitral valve is normal in structure. Trivial mitral valve regurgitation. No evidence of mitral stenosis.  4. The aortic valve is normal in structure. Aortic valve regurgitation is trivial. No aortic stenosis is present. Aortic valve area, by VTI measures 2.94 cm. Aortic valve mean gradient measures 3.0 mmHg. Aortic valve Vmax measures 1.09 m/s.  5. Aortic dilatation noted. There is mild dilatation of the ascending aorta, measuring 41 mm.  6. The inferior vena cava is normal in size with greater than 50% respiratory variability, suggesting right atrial pressure of 3 mmHg. FINDINGS  Left Ventricle: Left ventricular ejection fraction, by estimation, is 35 to 40%. The left ventricle has moderately decreased function. The left ventricle demonstrates regional wall motion abnormalities. The left ventricular internal cavity size was normal in size. There is no left ventricular hypertrophy. Left ventricular diastolic  parameters are consistent with Grade I diastolic dysfunction (impaired relaxation). Normal left ventricular filling pressure. Right Ventricle: The right ventricular size is normal. No increase in right ventricular wall thickness. Right ventricular systolic function is normal. Left Atrium: Left atrial size was normal in size. Right Atrium:  Right atrial size was normal in size. Pericardium: There is no evidence of pericardial effusion. Mitral Valve: The mitral valve is normal in structure. Trivial mitral valve regurgitation. No evidence of mitral valve stenosis. Tricuspid Valve: The tricuspid valve is normal in structure. Tricuspid valve regurgitation is trivial. No evidence of tricuspid stenosis. Aortic Valve: The aortic valve is normal in structure. Aortic valve regurgitation is trivial. No aortic stenosis is present. Aortic valve mean gradient measures 3.0 mmHg. Aortic valve peak gradient measures 4.8 mmHg. Aortic valve area, by VTI measures 2.94 cm. Pulmonic Valve: The pulmonic valve was normal in structure. Pulmonic valve regurgitation is trivial. No evidence of pulmonic stenosis. Aorta: Aortic dilatation noted. There is mild dilatation of the ascending aorta, measuring 41 mm. Venous: The inferior vena cava is normal in size with greater than 50% respiratory variability, suggesting right atrial pressure of 3 mmHg. IAS/Shunts: No atrial level shunt detected by color flow Doppler.  LEFT VENTRICLE PLAX 2D LVIDd:         4.10 cm   Diastology LVIDs:         2.70 cm   LV e' medial:    4.03 cm/s LV PW:         1.00 cm   LV E/e' medial:  14.3 LV IVS:        1.00 cm   LV e' lateral:   4.79 cm/s LVOT diam:     2.10 cm   LV E/e' lateral: 12.1 LV SV:         66 LV SV Index:   37 LVOT Area:     3.46 cm  RIGHT VENTRICLE RV S prime:     12.50 cm/s TAPSE (M-mode): 2.3 cm LEFT ATRIUM             Index        RIGHT ATRIUM           Index LA Vol (A2C):   34.6 ml 19.55 ml/m  RA Area:     15.70 cm LA Vol (A4C):   26.2 ml 14.80  ml/m  RA Volume:   38.80 ml  21.92 ml/m LA Biplane Vol: 31.0 ml 17.52 ml/m  AORTIC VALVE AV Area (Vmax):    2.78 cm AV Area (Vmean):   2.88 cm AV Area (VTI):     2.94 cm AV Vmax:           109.00 cm/s AV Vmean:          81.300 cm/s AV VTI:            0.225 m AV Peak Grad:      4.8 mmHg AV Mean Grad:      3.0 mmHg LVOT Vmax:         87.60 cm/s LVOT Vmean:        67.700 cm/s LVOT VTI:          0.191 m LVOT/AV VTI ratio: 0.85  AORTA Ao Root diam: 3.30 cm Ao Asc diam:  4.10 cm MITRAL VALVE               TRICUSPID VALVE MV Area (PHT): 4.40 cm    TR Peak grad:   18.3 mmHg MV E velocity: 57.80 cm/s  TR Vmax:        214.00 cm/s MV A velocity: 93.80 cm/s MV E/A ratio:  0.62        SHUNTS  Systemic VTI:  0.19 m                            Systemic Diam: 2.10 cm Armanda Magic MD Electronically signed by Armanda Magic MD Signature Date/Time: 03/06/2023/8:40:54 AM    Final    DG Chest 2 View  Result Date: 03/05/2023 CLINICAL DATA:  Chest pain EXAM: CHEST - 2 VIEW COMPARISON:  Chest x-ray 08/04/2017 FINDINGS: The heart size and mediastinal contours are within normal limits. Both lungs are clear. The visualized skeletal structures are unremarkable. IMPRESSION: No active cardiopulmonary disease. Electronically Signed   By: Darliss Cheney M.D.   On: 03/05/2023 15:08   Disposition   Pt is being discharged home today in good condition.  Follow-up Plans & Appointments     Follow-up Information     [facility]. Go in 18 day(s).   Specialty: Cardiology Why: Hospital follow up 03/25/2023 @ 9 am PLEASE bring a current medication list to appointment FREE valet parking, Entrance C, off [employer] [address] information: [address] [id number] mc [place] [address] [phone]        [facility] Follow up.   Specialty: Cardiology Why: Please come to the [facility] lab area on  03/13/23 for lab draw. Lab is open from 8-5 and closed from 12-1 for lunch. Please go to the third floor. When you get off the elevator there is a little round table with a doorbell, please press that button and somebody will come get you.  A lab slip will be mailed to you, if you do not receive this lab slip please go to the second floor and they will print one off for you. Contact information: [address] [place] [address] [phone]        Alver Sorrow, NP Follow up.   Specialty: Cardiology Why: Your appointment is on 04/01/2023 at 8:25am. Please arrive 15 min prior to appointment time. Contact information: [address] [place] [address] [phone]                Discharge Instructions     Amb Referral to Cardiac Rehabilitation   Complete by: As directed    Diagnosis: NSTEMI   After initial evaluation and assessments completed: Virtual Based Care may be provided alone or in conjunction with Phase 2 Cardiac Rehab based on patient barriers.: Yes   Intensive Cardiac Rehabilitation (ICR) [facility] location only OR Traditional Cardiac Rehabilitation (TCR) *If criteria for ICR are not met will enroll in TCR [facility] only): Yes   Diet - low sodium heart healthy   Complete by: As directed    Discharge instructions   Complete by: As directed    No lifting over 5 lbs for 1 week. No sexual activity for 1 week.  Keep procedure site clean & dry. If you notice increased pain, swelling, bleeding or pus, call/return!  You may shower, but no soaking baths/hot tubs/pools for 1 week.   Increase activity slowly   Complete by: As directed         Discharge Medications   Allergies as of 03/07/2023       Reactions   Augmentin [amoxicillin-pot Clavulanate] Other (See Comments)   Severe diarrhea, rash, worsened acid reflux   Erythromycin Rash        Medication List     STOP taking these medications  losartan 50 MG  tablet Commonly known as: COZAAR   metoprolol tartrate 50 MG tablet Commonly known as: LOPRESSOR       TAKE these medications    aspirin 81 MG tablet Take 1 tablet (81 mg total) by mouth daily.   BIEST/PROGESTERONE TD Place 2 mLs onto the skin in the morning and at bedtime. Apply to inner thighs or arms   Calcium Carbonate-Vitamin D 600-200 MG-UNIT Caps Take 1 capsule by mouth daily.   clidinium-chlordiazePOXIDE 5-2.5 MG capsule Commonly known as: LIBRAX Take 1 capsule by mouth 2 (two) times daily at 8 am and 10 pm.   famotidine 40 MG tablet Commonly known as: PEPCID TAKE 1 TABLET(40 MG) BY MOUTH AT BEDTIME What changed: See the new instructions.   ibuprofen 200 MG tablet Commonly known as: ADVIL Take 600 mg by mouth every 6 (six) hours as needed for moderate pain.   loratadine 10 MG tablet Commonly known as: CLARITIN Take 10 mg by mouth daily.   methenamine 1 g tablet Commonly known as: HIPREX Take 1 g by mouth 2 (two) times daily with a meal.   metoprolol succinate 100 MG 24 hr tablet Commonly known as: TOPROL-XL Take 1 tablet (100 mg total) by mouth daily. Take with or immediately following a meal. Start taking on: Mar 08, 2023   ondansetron 4 MG disintegrating tablet Commonly known as: ZOFRAN-ODT DISSOLVE 1 TABLET(4 MG) ON THE TONGUE EVERY 8 HOURS AS NEEDED FOR NAUSEA OR VOMITING   PROBIOTIC PO Take by mouth daily. 900 billion   RABEprazole 20 MG tablet Commonly known as: ACIPHEX Take 20 mg by mouth daily.   rosuvastatin 20 MG tablet Commonly known as: CRESTOR Take 1 tablet (20 mg total) by mouth every evening.   sacubitril-valsartan 24-26 MG Commonly known as: ENTRESTO Take 1 tablet by mouth 2 (two) times daily.   spironolactone 25 MG tablet Commonly known as: ALDACTONE Take 0.5 tablets (12.5 mg total) by mouth daily. Start taking on: Mar 08, 2023   Vitamin B Complex-C Caps Take 1 capsule by mouth daily.           Outstanding  Labs/Studies   Will order BMET to be completed in one week.   Duration of Discharge Encounter   Greater than 30 minutes including physician time.  Signed, Abagail Kitchens, PA-C 03/07/2023, 12:21 PM

## 2023-03-07 NOTE — Telephone Encounter (Signed)
Received secure chat that patient will need labs next Thursday, labs ordered and mailed to patient.

## 2023-03-07 NOTE — TOC Benefit Eligibility Note (Signed)
Patient Product/process development scientist completed.    The patient is currently admitted and upon discharge could be taking Entresto.  The current 30 day co-pay is $40.00.     Patient Product/process development scientist completed.    The patient is currently admitted and upon discharge could be taking Jardiance.  The current 30 day co-pay is $40.00.     Patient Product/process development scientist completed.    The patient is currently admitted and upon discharge could be taking [place].  The current 30 day co-pay is $64.00.   The patient is insured through [employer]   This test claim was processed through [facility]- copay amounts may vary at other pharmacies due to pharmacy/plan contracts, or as the patient moves through the different stages of their insurance plan.

## 2023-03-07 NOTE — Progress Notes (Signed)
Heart Failure Nurse Navigator Progress Note  PCP: Eustaquio Boyden, MD PCP-Cardiologist: Herbie Baltimore Admission Diagnosis: NSTEMi Admitted from: Home  Presentation:   AGUSTA CHAMI presented with complaints of centralized chest pain, arm weakness, nauseous. BP 163/112, HR 81, Troponin 437 - 755, EKG with NSR, CXR no active cardiopulmonary disease. ECHO on 5/16 with LVEF 35-40%, findings consistent with Takotsubo cardiomyopathy.   Patient was educated on the sign and symptoms of heart failure, daily weights, when to call her doctor or go to the ED, Diet/ fluid restrictions, taking all medications as prescribed and attending all medical appointments. Patient verbalized her understanding, a HF TOC appointment was scheduled for 03/25/2023 @ 9 am.   ECHO/ LVEF: 35-40% G1DD  Clinical Course:  Past Medical History:  Diagnosis Date   Abdominal aortic atherosclerosis (HCC) 10/2014   by CT scan   B12 deficiency    Chronic anxiety    Chronic cough    cyclical cough syndrome   Complication of anesthesia 9 yrs ago   awake and in severe pain with colonoscopy   Concussion    Diet-controlled diabetes mellitus (HCC) 02/22/2017   New dx 02/2017   GERD (gastroesophageal reflux disease)    severe leading to chronic cough and hoarseness   History of Epstein-Barr virus infection 2012   Hyperlipidemia    Hypertension    IBS (irritable bowel syndrome)    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, PB-8 probiotic, healthy diet and exercise   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   By CT 09/2017 & 09/2018 - max diameter 4cm f/u annually    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   had surgery with 5 days of chemotherapy   Osteopenia 01/2015   T -0.8 spine, -1.7 hip   Pyelonephritis due to Escherichia coli 07/16/2017   Uterine prolapse    Vocal cord paralysis, bilateral partial 2001   evaluated at [facility] - did not require surgery     Social History   Socioeconomic History   Marital status:  Married    Spouse name: Not on file   Number of children: 2   Years of education: 16   Highest education level: Not on file  Occupational History   Occupation: Magazine features editor: [employer]  Tobacco Use   Smoking status: Never   Smokeless tobacco: Never  Vaping Use   Vaping Use: Never used  Substance and Sexual Activity   Alcohol use: Yes    Comment: twice a year   Drug use: No   Sexual activity: Yes    Partners: Male  Other Topics Concern   Not on file  Social History Narrative   Married 1973. 2 daughters '74, '80; 5 grandchildren.    No h/o physical or sexual abuse. 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    Edu: [employer].    Activity: stays active.   Diet: good water, fruits/vegetables daily   Social Determinants of Health   Financial Resource Strain: Low Risk  (04/17/2022)   Overall Financial Resource Strain (CARDIA)    Difficulty of Paying Living Expenses: Not hard at all  Food Insecurity: No Food Insecurity (03/06/2023)   Hunger Vital Sign    Worried About Running Out of Food in the Last Year: Never true    Ran Out of Food in the Last Year: Never true  Transportation Needs: No Transportation Needs (03/06/2023)   PRAPARE - Transportation  Lack of Transportation (Medical): No    Lack of Transportation (Non-Medical): No  Physical Activity: Inactive (04/17/2022)   Exercise Vital Sign    Days of Exercise per Week: 0 days    Minutes of Exercise per Session: 0 min  Stress: Stress Concern Present (04/17/2022)   [employer] of Occupational Health - Occupational Stress Questionnaire    Feeling of Stress : To some extent  Social Connections: Moderately Isolated (04/17/2022)   Social Connection and Isolation Panel [NHANES]    Frequency of Communication with Friends and Family: More than three times a week    Frequency of Social Gatherings with Friends and Family: More than three times a week     Attends Religious Services: Never    Database administrator or Organizations: No    Attends Engineer, structural: Never    Marital Status: Married   Water engineer and Provision:  Detailed education and instructions provided on heart failure disease management including the following:  Signs and symptoms of Heart Failure When to call the physician Importance of daily weights Low sodium diet Fluid restriction Medication management Anticipated future follow-up appointments  Patient education given on each of the above topics.  Patient acknowledges understanding via teach back method and acceptance of all instructions.  Education Materials:  "Living Better With Heart Failure" Booklet, HF zone tool, & Daily Weight Tracker Tool.  Patient has scale at home: Yes Patient has pill box at home: Yes    High Risk Criteria for Readmission and/or Poor Patient Outcomes: Heart failure hospital admissions (last 6 months): 0  No Show rate: 2% Difficult social situation: No, lives with husband Demonstrates medication adherence: Yes Primary Language: English Literacy level: Reading, writing, and comprehension  Barriers of Care:   Diet/ fluid restrictions ( salt)  Daily weights  Considerations/Referrals:   Referral made to Heart Failure Pharmacist Stewardship: Yes Referral made to Heart Failure CSW/NCM TOC: No Referral made to Heart & Vascular TOC clinic: Yes, 03/25/2023 @ 9 am   Items for Follow-up on DC/TOC: Diet/ fluid restrictions ( salt)  Daily weights Continued HF education   Rhae Hammock, BSN, RN Heart Failure Teacher, adult education Only

## 2023-03-07 NOTE — Progress Notes (Signed)
CARDIAC REHAB PHASE I   PRE:  Rate/Rhythm: 96 SR    BP: sitting 128/78    SpO2: 96 RA  MODE:  Ambulation: 600 ft   POST:  Rate/Rhythm: 107 ST    BP: sitting 124/94     SpO2: 97 RA  Pt ambulated very slowly first 350 ft, talked entire walk. Asked pt to increase pace, which she was able to do. Denied SOB or CP. To recliner, VSS although DBP elevated. Pt did however c/o increased SOB after sitting in the recliner for 30 min. Sp02 98 RA.   Discussed with pt and husband MI/takotsubo, restrictions, diet, exercise, and CRPII. Reviewed daily wts and HF booklet. Will refer to [place] CRPII.  [id number]  Ethelda Chick BS, ACSM-CEP 03/07/2023 11:11 AM

## 2023-03-07 NOTE — Progress Notes (Signed)
   Heart Failure Stewardship Pharmacist Progress Note   PCP: Eustaquio Boyden, MD PCP-Cardiologist: Bryan Lemma, MD    HPI:  72 yo F with PMH of CAD, HTN, HLD, aortic atherosclerosis, TIA, iron deficiency anemia, and GERD.   Presented to the ED on 5/15 with chest pain and bilateral arm weakness. Troponin elevated 437>>755. EKG was nonacute. Admitted for NSTEMI. ECHO 5/16 showed LVEF 35-40%, regional wall motion abnormalities, akinesis of the left ventricular, mid inferoseptal wall and anteroseptal wall, hypokinesis of the left ventricular, entire inferior wall G1DD, RV normal, trivial MR. LHC on 5/16 showed normal coronary arteries, LVEDP 17. Findings consistent with Takotsubo cardiomyopathy.   Current HF Medications: Beta Blocker: metoprolol XL 50 mg daily ACE/ARB/ARNI: Entresto 24/26 mg BID MRA: spironolactone 12.5 mg daily  Prior to admission HF Medications: Beta blocker: metoprolol tartrate 50 mg BID ACE/ARB/ARNI: losartan 50 mg daily  Pertinent Lab Values: Serum creatinine 0.81, BUN 9, Potassium 3.6, Sodium 135, A1c 6.2   Vital Signs: Weight: 158 lbs Blood pressure: 110-120/70s  Heart rate: 70-90s  I/O: incomplete  Medication Assistance / Insurance Benefits Check: Does the patient have prescription insurance?  Yes Type of insurance plan: [employer]  Outpatient Pharmacy:  Prior to admission outpatient pharmacy: [facility] Is the patient willing to use [facility] TOC pharmacy at discharge? Yes Is the patient willing to transition their outpatient pharmacy to utilize a [facility] outpatient pharmacy?   No    Assessment: 1. Acute systolic CHF (LVEF 35-40%), due to Takotsubo cardiomyopathy. NYHA class II symptoms. - Not fluid overloaded on exam, LVEDP 17 on cath. No indication for diuretics.  - Continue metoprolol XL 50 mg daily, may need to increase to 100 mg daily to be equivalent to what she was taking PTA - Continue Entresto 24/26 mg BID - Agree with starting  spironolactone 12.5 mg daily - Not a candidate for SGLT2i given previous history of bladder infections and sepsis    Plan: 1) Medication changes recommended at this time: - Agree with changes - Increase metoprolol XL to 100 mg daily  2) Patient assistance: Sherryll Burger copay $40 - affordable per patient  - Jardiance copay $40 - Farxiga copay $64  3)  Education  - Patient has been educated on current HF medications and potential additions to HF medication regimen - Patient verbalizes understanding that over the next few months, these medication doses may change and more medications may be added to optimize HF regimen - Patient has been educated on basic disease state pathophysiology and goals of therapy   Sharen Hones, PharmD, BCPS Heart Failure Stewardship Pharmacist Phone [phone]

## 2023-03-10 ENCOUNTER — Telehealth: Payer: Self-pay | Admitting: *Deleted

## 2023-03-10 NOTE — Transitions of Care (Post Inpatient/ED Visit) (Signed)
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 I am walking twice a day) Any questions or concerns?: No  Items Reviewed: Did you receive and understand the discharge instructions provided?: Yes Medications obtained,verified, and reconciled?: Yes (Medications Reviewed) Any new allergies since your discharge?: No Dietary orders reviewed?: Yes Type of Diet Ordered:: low sodium heart healthy diet Do you have support at home?: Yes People in Home: spouse Name of Support/Comfort Primary Source: [place]  Medications Reviewed Today: Medications Reviewed Today     Reviewed by Luella Cook, RN (Case Manager) on 03/10/23 at 1205  Med List Status: <None>   Medication Order Taking? Sig Documenting Provider Last Dose Status Informant  aspirin 81 MG tablet [id number] Yes Take 1 tablet (81 mg total) by mouth daily. Eustaquio Boyden, MD Taking Active Self, Pharmacy Records  Calcium Carbonate-Vitamin D 600-200 MG-UNIT CAPS [id number] Yes Take 1 capsule by mouth daily. [provider] Taking Active Self, Pharmacy Records  clidinium-chlordiazePOXIDE (LIBRAX) 5-2.5 MG capsule [id number] Yes Take 1 capsule by mouth 2 (two) times daily at 8 am and 10 pm. [provider] Taking Active Self, Pharmacy Records  Estradiol-Estriol-Progesterone (BIEST/PROGESTERONE TD) [id number] Yes Place 2 mLs onto the skin in the morning and at bedtime. Apply to inner thighs or arms [provider] Taking Active Self, Pharmacy Records   famotidine (PEPCID) 40 MG tablet [id number] Yes TAKE 1 TABLET(40 MG) BY MOUTH AT BEDTIME  Patient taking differently: Take 40 mg by mouth at bedtime.   Eustaquio Boyden, MD Taking Active Self, Pharmacy Records  ibuprofen (ADVIL,MOTRIN) 200 MG tablet [id number] Yes Take 600 mg by mouth every 6 (six) hours as needed for moderate pain. [provider] Taking Active Self, Pharmacy Records  loratadine (CLARITIN) 10 MG tablet [id number] Yes Take 10 mg by mouth daily. [provider] Taking Active Self, Pharmacy Records  methenamine (HIPREX) 1 g tablet [id number] Yes Take 1 g by mouth 2 (two) times daily with a meal.  [provider] Taking Active Self, Pharmacy Records  metoprolol succinate (TOPROL-XL) 100 MG 24 hr tablet [id number] Yes Take 1 tablet (100 mg total) by mouth daily. Take with or immediately following a meal. Abagail Kitchens, PA-C Taking Active   ondansetron (ZOFRAN-ODT) 4 MG disintegrating tablet [id number] Yes DISSOLVE 1 TABLET(4 MG) ON THE TONGUE EVERY 8 HOURS AS NEEDED FOR NAUSEA OR VOMITING Eustaquio Boyden, MD Taking Active Self, Pharmacy Records  Probiotic Product (PROBIOTIC PO) [id number] Yes Take by mouth daily. 900 billion [provider] Taking Active Self, Pharmacy Records  RABEprazole (ACIPHEX) 20 MG tablet [id number] Yes Take 20 mg by mouth daily. [provider] Taking Active Self, Pharmacy Records  rosuvastatin (CRESTOR) 20 MG tablet [id number] Yes Take 1 tablet (20 mg total) by mouth every evening. Eustaquio Boyden, MD Taking Active Self, Pharmacy Records  sacubitril-valsartan [facility]) 24-26 [place] [id number] Yes Take 1 tablet by mouth 2 (two) times daily. Abagail Kitchens, PA-C Taking Active   spironolactone (ALDACTONE) 25 MG tablet [id number] Yes Take 0.5 tablets (12.5 mg total) by mouth daily. Abagail Kitchens, PA-C Taking Active   Vitamin B  Complex-C CAPS [id number] Yes Take 1 capsule by mouth daily. [provider] Taking Active Self,  Pharmacy Records            Home Care and Equipment/Supplies: Were Home Health Services Ordered?: NA Any new equipment or medical supplies ordered?: NA  Functional Questionnaire: Do you need assistance with bathing/showering or dressing?: Yes Do you need assistance with meal preparation?: Yes Do you need assistance with eating?: No Do you have difficulty maintaining continence: No Do you need assistance with getting out of bed/getting out of a chair/moving?: No Do you have difficulty managing or taking your medications?: No  Follow up appointments reviewed: PCP Follow-up appointment confirmed?: NA Specialist Hospital Follow-up appointment confirmed?: Yes Date of Specialist follow-up appointment?: 03/25/23 Follow-Up Specialty Provider:: [id number] Cardiology 9 AM, [id number] Caitlin 9:00 Do you need transportation to your follow-up appointment?: No Do you understand care options if your condition(s) worsen?: Yes-patient verbalized understanding  SDOH Interventions Today    Flowsheet Row Most Recent Value  SDOH Interventions   Food Insecurity Interventions Intervention Not Indicated  Housing Interventions Intervention Not Indicated  Transportation Interventions Intervention Not Indicated      Interventions Today    Flowsheet Row Most Recent Value  General Interventions   General Interventions Discussed/Reviewed General Interventions Discussed, General Interventions Reviewed, Doctor Visits  Exercise Interventions   Exercise Discussed/Reviewed Exercise Discussed, Exercise Reviewed  [RN discussed walking 3 x dsy for 15 min. RN discussed stopping when taking a trip to get out and walk  before traveling the full distance]  Education Interventions   Education Provided Provided Education  [provided education on food preparation, exercising]  Nutrition Interventions   Nutrition Discussed/Reviewed Nutrition Discussed, Nutrition Reviewed  [RN discussed good preparation]  Pharmacy  Interventions   Pharmacy Dicussed/Reviewed Pharmacy Topics Discussed, Pharmacy Topics Reviewed      TOC Interventions Today    Flowsheet Row Most Recent Value  TOC Interventions   TOC Interventions Discussed/Reviewed TOC Interventions Discussed, TOC Interventions Reviewed       Gean Maidens BSN RN [facility] Care [facility] [phone]

## 2023-03-12 ENCOUNTER — Telehealth (HOSPITAL_COMMUNITY): Payer: Self-pay

## 2023-03-12 NOTE — Telephone Encounter (Signed)
Called patient to see if she is interested in the Cardiac Rehab Program. Patient expressed interest. Explained scheduling process and went over insurance, patient verbalized understanding. Will contact patient for scheduling once f/u has been completed. 

## 2023-03-13 DIAGNOSIS — I5181 Takotsubo syndrome: Secondary | ICD-10-CM | POA: Diagnosis not present

## 2023-03-14 LAB — BASIC METABOLIC PANEL
BUN/Creatinine Ratio: 14 (ref 12–28)
BUN: 13 mg/dL (ref 8–27)
CO2: 20 mmol/L (ref 20–29)
Calcium: 9.7 mg/dL (ref 8.7–10.3)
Chloride: 101 mmol/L (ref 96–106)
Creatinine, Ser: 0.95 mg/dL (ref 0.57–1.00)
Glucose: 116 mg/dL — ABNORMAL HIGH (ref 70–99)
Potassium: 4.3 mmol/L (ref 3.5–5.2)
Sodium: 136 mmol/L (ref 134–144)
eGFR: 64 mL/min/{1.73_m2} (ref >59)

## 2023-03-24 ENCOUNTER — Other Ambulatory Visit (HOSPITAL_BASED_OUTPATIENT_CLINIC_OR_DEPARTMENT_OTHER): Payer: Self-pay

## 2023-03-24 ENCOUNTER — Ambulatory Visit (HOSPITAL_BASED_OUTPATIENT_CLINIC_OR_DEPARTMENT_OTHER): Payer: Medicare PPO | Admitting: Family

## 2023-03-24 ENCOUNTER — Encounter (HOSPITAL_BASED_OUTPATIENT_CLINIC_OR_DEPARTMENT_OTHER): Payer: Self-pay | Admitting: Family

## 2023-03-24 VITALS — BP 124/88 | HR 83 | Ht 64.0 in | Wt 154.0 lb

## 2023-03-24 DIAGNOSIS — E785 Hyperlipidemia, unspecified: Secondary | ICD-10-CM

## 2023-03-24 DIAGNOSIS — I5181 Takotsubo syndrome: Secondary | ICD-10-CM | POA: Diagnosis not present

## 2023-03-24 DIAGNOSIS — I10 Essential (primary) hypertension: Secondary | ICD-10-CM

## 2023-03-24 DIAGNOSIS — K582 Mixed irritable bowel syndrome: Secondary | ICD-10-CM

## 2023-03-24 DIAGNOSIS — R002 Palpitations: Secondary | ICD-10-CM

## 2023-03-24 MED ORDER — SACUBITRIL-VALSARTAN 24-26 MG PO TABS
1.0000 | ORAL_TABLET | Freq: Two times a day (BID) | ORAL | 3 refills | Status: DC
Start: 2023-03-24 — End: 2024-03-21
  Filled 2023-03-24: qty 180, 90d supply, fill #0
  Filled 2023-06-22: qty 180, 90d supply, fill #1
  Filled 2023-09-14: qty 180, 90d supply, fill #2
  Filled 2023-12-09: qty 180, 90d supply, fill #3

## 2023-03-24 MED ORDER — ROSUVASTATIN CALCIUM 20 MG PO TABS
20.0000 mg | ORAL_TABLET | Freq: Every evening | ORAL | 3 refills | Status: DC
Start: 2023-03-24 — End: 2024-03-17
  Filled 2023-03-24: qty 90, 90d supply, fill #0
  Filled 2023-06-22: qty 90, 90d supply, fill #1
  Filled 2023-09-07: qty 90, 90d supply, fill #2
  Filled 2023-12-09: qty 90, 90d supply, fill #3

## 2023-03-24 MED ORDER — SPIRONOLACTONE 25 MG PO TABS
12.5000 mg | ORAL_TABLET | Freq: Every day | ORAL | 3 refills | Status: DC
Start: 2023-03-24 — End: 2024-03-09
  Filled 2023-03-24 – 2023-04-03 (×2): qty 45, 90d supply, fill #0
  Filled 2023-06-16 – 2023-06-20 (×2): qty 45, 90d supply, fill #1
  Filled 2023-09-07: qty 45, 90d supply, fill #2
  Filled 2023-12-09: qty 45, 90d supply, fill #3

## 2023-03-24 MED ORDER — METOPROLOL SUCCINATE ER 100 MG PO TB24
100.0000 mg | ORAL_TABLET | Freq: Every day | ORAL | 3 refills | Status: DC
Start: 2023-03-24 — End: 2024-03-17
  Filled 2023-03-24 – 2023-04-03 (×2): qty 90, 90d supply, fill #0
  Filled 2023-06-22: qty 90, 90d supply, fill #1
  Filled 2023-09-07: qty 90, 90d supply, fill #2
  Filled 2023-12-09: qty 90, 90d supply, fill #3

## 2023-03-24 NOTE — Progress Notes (Unsigned)
Office Visit    Patient Name: Shelby Vazquez Date of Encounter: 03/24/2023  PCP:  Eustaquio Boyden, MD   [facility]  Cardiologist:  Bryan Lemma, MD  Advanced Practice Provider:  No care team member to display Electrophysiologist:  None     Chief Complaint    [name] is a 72 y.o. female   presents today for hospital follow-up  Past Medical History    Past Medical History:  Diagnosis Date   Abdominal aortic atherosclerosis (HCC) 10/2014   by CT scan   B12 deficiency    Chronic anxiety    Chronic cough    cyclical cough syndrome   Complication of anesthesia 9 yrs ago   awake and in severe pain with colonoscopy   Concussion    Diet-controlled diabetes mellitus (HCC) 02/22/2017   New dx 02/2017   GERD (gastroesophageal reflux disease)    severe leading to chronic cough and hoarseness   History of Epstein-Barr virus infection 2012   Hyperlipidemia    Hypertension    IBS (irritable bowel syndrome)    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, PB-8 probiotic, healthy diet and exercise   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   By CT 09/2017 & 09/2018 - max diameter 4cm f/u annually    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   had surgery with 5 days of chemotherapy   Osteopenia 01/2015   T -0.8 spine, -1.7 hip   Pyelonephritis due to Escherichia coli 07/16/2017   Uterine prolapse    Vocal cord paralysis, bilateral partial 2001   evaluated at [facility] - did not require surgery   Past Surgical History:  Procedure Laterality Date   BUNIONECTOMY     [place] '2000   CATARACT EXTRACTION     right 01/2010, left may 2011   COLONOSCOPY  03/2018   diverticulosis, rpt 5 yrs ([name])   COLONOSCOPY WITH PROPOFOL N/A 03/31/2014   sessile serrated adenomas, sigmoid diverticulosis, rpt 3 yrs Bernette Redbird V, MD)   CORONARY CALCIUM SCORE-CORONARY CTA  09/2017   Calcium score 53 . Normal right dominant coronary artery system with mild onset of  CAD. Aneurysmal dilation of the ascending aorta with maximum diameter diameter of 4.0-3.8 centimeter. -Recommend annual CT/MRA December 2018   DILATION AND CURETTAGE OF UTERUS  '77   for molar pregnancy excision   ESOPHAGOGASTRODUODENOSCOPY N/A 03/31/2014   mult gastric polyps, mild mucosal hemorrhages; ESOPHAGOGASTRODUODENOSCOPY (EGD);  Surgeon: Florencia Reasons, MD   ESOPHAGOGASTRODUODENOSCOPY ENDOSCOPY  april 2014   INTERSTIM IMPLANT PLACEMENT  2018   LEFT HEART CATH AND CORONARY ANGIOGRAPHY N/A 03/06/2023   Procedure: LEFT HEART CATH AND CORONARY ANGIOGRAPHY;  Surgeon: Lennette Bihari, MD;  Location: [facility] INVASIVE CV LAB;  Service: Cardiovascular;  Laterality: N/A;   toe surgery Right 09/2018   TRANSTHORACIC ECHOCARDIOGRAM  09/2017   Normal LV size and function. EF 55-60%. GR 1 DD. No regional wall motion abnormalities. No significant valvular abnormalities.    Allergies  Allergies  Allergen Reactions   Augmentin [Amoxicillin-Pot Clavulanate] Other (See Comments)    Severe diarrhea, rash, worsened acid reflux   Erythromycin Rash    History of Present Illness    Shelby Vazquez is a 72 y.o. female with a hx of hypertension, aortic atherosclerosis, hyperlipidemia, palpitations, coronary artery disease (coronary CTA with nonobstructive CAD), TIA, iron deficiency anemia, GERD, MDD last seen while hospitalized  Prior cardiac work-up includes echocardiogram 09/2017 revealing normal LVEF 55 to 60%,  grade 1 diastolic dysfunction, mild dilation of ascending aorta 42 mm, trivial MR.  She underwent cardiac CTA 09/2017 revealing coronary calcium score of 53 placing her in the 72nd percentile for age and sex pressure control.  She had mild nonobstructive coronary disease (prox LAD 25-50%, LCx 0-25%). Ascending aorta measured 40x78mm.   She was seen 12/20/2021 for preop clearance for removal of deep implant right foot by Ortho.  She had an episode of chest discomfort earlier that week while sitting and  watching television.  Chest pain thought to be non cardiac, clearance for surgery provided, and started on low dose Losartan 12.5mg  QD. At follow up 12/24/21 with her PPC dose was increased to 25mg  QD.   Seen 03/19/22 noted fatigue - CMP, CBC, thyroid panel updated which were unremarkable. Seen 07/09/22. She was doing well from a cardiac perspective with LDL of 53 and had enjoyed trip to [place] and [place]. Hospitalized in 08/2022 with Interstim replacement for bladder control. Notes complicated by infection.   Seen 12/20/2022 doing well from a cardiac perspective though BP not at goal and losartan was increased to 50 mg daily.  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  LVEF 35-40% with wall motion abnormalities and grade 1 diastolic dysfunction.  Losartan transitioned to Entresto and Lopressor transition to Toprol-XL.  Spironolactone also added.  Not SGLT2 candidate due to prior history of bladder infections and sepsis.  She presents today for follow up. Coming up in April planning to drive to [place] to see total eclipse of the sun then is flying gout for a few weeks to [place].  She reports feeling well from a cardiac perspective. She continues to have difficulties with IBS and is seeing GI next week. Has had difficulties with bloating with these episodes. Reports feeling fatigued - she is not certain if she is fatigued related to IBS or alternate etiology. Has not been checking blood pressure routinely at home, but does have a cuff. Has been walking for exercise some but interested in increasing.   Presents today for follow up with her husband. Enjoyed her trip to [place]. Since her hospital discharge   Initially 15 min twice per day then increased to 17 min twice per day. Then one morning was walking without her husband - she mad e amil 1.25 miles felt okay that morning   After about 11am feels worn out, rests until 1:15pm  when she goes to get her grandson from school, then that evening felt very weak, wobbly, some mild dizziness  She has tempered things and cut back.  Stil maintaining a littles less than a mile - with the morning- feels wobby in the morning - 19 or 20 minutes  Requests guideliens on how to increase activity.   Grandson in theatre  Taking medication in the morning after she eats  Home BP 100s-120s/60-70s.   6/24 - dr. Eudelia Bunch[facility]  EKGs/Labs/Other Studies Reviewed:   The following studies were reviewed today: Cardiac Studies & Procedures   CARDIAC CATHETERIZATION  CARDIAC CATHETERIZATION 03/06/2023  Narrative   LV end diastolic pressure is mildly elevated.  Normal coronary arteries.  Moderately severe LV function with wall motion abnormality consistent with Takotsubo cardiomyopathy.  EF estimate approximately 35%; LVEDP 17 mmHg.  RECOMMENDATION: Guideline directed medical therapy for reduced LV function.  Consider transition from losartan to Entresto, change to metoprolol succinate or carvedilol for beta-blocker therapy, and consider subsequent initiation of  Jardiance and spironolactone.  Findings Coronary Findings Diagnostic  Dominance: Right  Ramus Intermedius Vessel is large.  Left Circumflex  First Obtuse Marginal Branch  Intervention  No interventions have been documented.     ECHOCARDIOGRAM  ECHOCARDIOGRAM COMPLETE 03/06/2023  Narrative ECHOCARDIOGRAM REPORT    Patient Name:   MORGAN BARKMAN [facility] Date of Exam: 03/06/2023 Medical Rec #:  [id number]      Height:       64.0 in Accession #:    [id number]     Weight:       158.0 lb Date of Birth:  18-Jul-1951      BSA:          1.770 m Patient Age:    72 years       BP:           104/63 mmHg Patient Gender: F              HR:           80 bpm. Exam Location:  Inpatient  Procedure: 2D Echo, Cardiac Doppler, Color Doppler and Intracardiac Opacification Agent  Indications:    Acute  Ischemic heart disease  History:        Patient has prior history of Echocardiogram examinations, most recent 09/24/2017.  Sonographer:    Lucy Antigua Referring Phys: [phone] PETER M [place]  IMPRESSIONS   1. Left ventricular ejection fraction, by estimation, is 35 to 40%. The left ventricle has moderately decreased function. The left ventricle demonstrates regional wall motion abnormalities (see scoring diagram/findings for description). Left ventricular diastolic parameters are consistent with Grade I diastolic dysfunction (impaired relaxation). There is akinesis of the left ventricular, mid inferoseptal wall and anteroseptal wall. There is hypokinesis of the left ventricular, entire inferior wall. There is akinesis of the left ventricular, apical septal wall and anterior wall. 2. Right ventricular systolic function is normal. The right ventricular size is normal. 3. The mitral valve is normal in structure. Trivial mitral valve regurgitation. No evidence of mitral stenosis. 4. The aortic valve is normal in structure. Aortic valve regurgitation is trivial. No aortic stenosis is present. Aortic valve area, by VTI measures 2.94 cm. Aortic valve mean gradient measures 3.0 mmHg. Aortic valve Vmax measures 1.09 m/s. 5. Aortic dilatation noted. There is mild dilatation of the ascending aorta, measuring 41 mm. 6. The inferior vena cava is normal in size with greater than 50% respiratory variability, suggesting right atrial pressure of 3 mmHg.  FINDINGS Left Ventricle: Left ventricular ejection fraction, by estimation, is 35 to 40%. The left ventricle has moderately decreased function. The left ventricle demonstrates regional wall motion abnormalities. The left ventricular internal cavity size was normal in size. There is no left ventricular hypertrophy. Left ventricular diastolic parameters are consistent with Grade I diastolic dysfunction (impaired relaxation). Normal left ventricular filling  pressure.  Right Ventricle: The right ventricular size is normal. No increase in right ventricular wall thickness. Right ventricular systolic function is normal.  Left Atrium: Left atrial size was normal in size.  Right Atrium: Right atrial size was normal in size.  Pericardium: There is no evidence of pericardial effusion.  Mitral Valve: The mitral valve is normal in structure. Trivial mitral valve regurgitation. No evidence of mitral valve stenosis.  Tricuspid Valve: The tricuspid valve is normal in structure. Tricuspid valve regurgitation is trivial. No evidence of tricuspid stenosis.  Aortic Valve: The aortic valve is normal in structure. Aortic valve regurgitation is trivial. No aortic stenosis is present. Aortic valve  mean gradient measures 3.0 mmHg. Aortic valve peak gradient measures 4.8 mmHg. Aortic valve area, by VTI measures 2.94 cm.  Pulmonic Valve: The pulmonic valve was normal in structure. Pulmonic valve regurgitation is trivial. No evidence of pulmonic stenosis.  Aorta: Aortic dilatation noted. There is mild dilatation of the ascending aorta, measuring 41 mm.  Venous: The inferior vena cava is normal in size with greater than 50% respiratory variability, suggesting right atrial pressure of 3 mmHg.  IAS/Shunts: No atrial level shunt detected by color flow Doppler.   LEFT VENTRICLE PLAX 2D LVIDd:         4.10 cm   Diastology LVIDs:         2.70 cm   LV e' medial:    4.03 cm/s LV PW:         1.00 cm   LV E/e' medial:  14.3 LV IVS:        1.00 cm   LV e' lateral:   4.79 cm/s LVOT diam:     2.10 cm   LV E/e' lateral: 12.1 LV SV:         66 LV SV Index:   37 LVOT Area:     3.46 cm   RIGHT VENTRICLE RV S prime:     12.50 cm/s TAPSE (M-mode): 2.3 cm  LEFT ATRIUM             Index        RIGHT ATRIUM           Index LA Vol (A2C):   34.6 ml 19.55 ml/m  RA Area:     15.70 cm LA Vol (A4C):   26.2 ml 14.80 ml/m  RA Volume:   38.80 ml  21.92 ml/m LA Biplane Vol:  31.0 ml 17.52 ml/m AORTIC VALVE AV Area (Vmax):    2.78 cm AV Area (Vmean):   2.88 cm AV Area (VTI):     2.94 cm AV Vmax:           109.00 cm/s AV Vmean:          81.300 cm/s AV VTI:            0.225 m AV Peak Grad:      4.8 mmHg AV Mean Grad:      3.0 mmHg LVOT Vmax:         87.60 cm/s LVOT Vmean:        67.700 cm/s LVOT VTI:          0.191 m LVOT/AV VTI ratio: 0.85  AORTA Ao Root diam: 3.30 cm Ao Asc diam:  4.10 cm  MITRAL VALVE               TRICUSPID VALVE MV Area (PHT): 4.40 cm    TR Peak grad:   18.3 mmHg MV E velocity: 57.80 cm/s  TR Vmax:        214.00 cm/s MV A velocity: 93.80 cm/s MV E/A ratio:  0.62        SHUNTS Systemic VTI:  0.19 m Systemic Diam: 2.10 cm  Armanda Magic MD Electronically signed by Armanda Magic MD Signature Date/Time: 03/06/2023/8:40:54 AM    Final     CT SCANS  CT CORONARY MORPH W/CTA COR W/SCORE 10/06/2017  Addendum 10/06/2017  6:33 PM ADDENDUM REPORT: 10/06/2017 18:31  CLINICAL DATA:  72 year old female with h/o hypertension, hyperlipidemia, DOE and family h/o premature CAD.  EXAM: Cardiac/Coronary  CT  TECHNIQUE: The patient was scanned on a [employer].  FINDINGS: A 120 kV prospective scan was triggered in the descending thoracic aorta at 111 HU's. Axial non-contrast 3 mm slices were carried out through the heart. The data set was analyzed on a dedicated work station and scored using the Agatson method. Gantry rotation speed was 250 msecs and collimation was .6 mm. 5 mg of iv Metoprolol and 0.8 mg of sl NTG was given. The 3D data set was reconstructed in 5% intervals of the 67-82 % of the R-R cycle. Diastolic phases were analyzed on a dedicated work station using MPR, MIP and VRT modes. The patient received 80 cc of contrast.  Aorta: Aneurysmal dilatation of the ascending aorta with maximum diameter 40 x 38 mm. No calcifications. No dissection.  Aortic Valve:  Trileaflet.  No calcifications.  Coronary  Arteries:  Normal coronary origin.  Right dominance.  RCA is a large dominant artery that gives rise to PDA and PLA. There is trivial plaque in the proximal RCA.  Left main is a short artery that gives rise to LAD and LCX arteries.  LAD is a large and long vessel that gives rise to one diagonal artery and wraps around the apex. There is mild calcified plaque in the proximal LAD with associated stenosis 25-50%  LCX is a non-dominant artery that gives rise to one OM1 branch. There is minimal calcified plaque in the proximal LCX artery with associated stenosis 0-25%.  Other findings:  Normal pulmonary vein drainage into the left atrium.  Normal let atrial appendage without a thrombus.  Normal size of the pulmonary artery.  IMPRESSION: 1. Coronary calcium score of 53. This was 72 percentile for age and sex matched control.  2. Normal coronary origin with right dominance.  3. Mild non-obstructive CAD. Aggressive risk factor modification is recommended.  4. Aneurysmal dilatation of the ascending aorta with maximum diameter 40 x 38 mm. Annual follow up with CTA or MRA is recommended.   Electronically Signed By: Tobias Alexander On: 10/06/2017 18:31  Narrative EXAM: OVER-READ INTERPRETATION  CT CHEST  The following report is an over-read performed by radiologist Dr. Marinda Elk [facility] on 10/06/2017. This over-read does not include interpretation of cardiac or coronary anatomy or pathology. The coronary calcium score/coronary CTA interpretation by the cardiologist is attached.  COMPARISON:  None.  FINDINGS: Mediastinum/Nodes: No masses or lymphadenopathy.  Lungs/Pleura: Lungs show mild bilateral scarring. There is no evidence of pulmonary edema, consolidation, pneumothorax, nodule or pleural fluid in the visualized chest.  Upper Abdomen: No acute abnormality.  Musculoskeletal: No chest wall mass or suspicious bone  lesions identified.  IMPRESSION: No significant nonvascular findings in the imaged portions of the chest.  Electronically Signed: By: Irish Lack M.D. On: 10/06/2017 08:59           EKG:  No EKG today.  Recent Labs: 03/05/2023: ALT 20 03/06/2023: TSH 4.870 03/07/2023: Hemoglobin 13.0; Platelets 290 03/13/2023: BUN 13; Creatinine, Ser 0.95; Potassium 4.3; Sodium 136  Recent Lipid Panel    Component Value Date/Time   CHOL 126 03/06/2023 0430   TRIG 88 03/06/2023 0430   HDL 37 (L) 03/06/2023 0430   CHOLHDL 3.4 03/06/2023 0430   VLDL 18 03/06/2023 0430   LDLCALC 71 03/06/2023 0430   LDLDIRECT 69 03/19/2022 1144   LDLDIRECT 109.0 12/25/2017 0835   Home Medications   Current Meds  Medication Sig   acetaminophen (TYLENOL) 500 MG tablet Take 500 mg by mouth every 6 (six) hours as needed.   aspirin 81 MG tablet  Take 1 tablet (81 mg total) by mouth daily.   Calcium Carbonate-Vitamin D 600-200 MG-UNIT CAPS Take 1 capsule by mouth daily.   clidinium-chlordiazePOXIDE (LIBRAX) 5-2.5 MG capsule Take 1 capsule by mouth 2 (two) times daily at 8 am and 10 pm.   Estradiol-Estriol-Progesterone (BIEST/PROGESTERONE TD) Place 2 mLs onto the skin in the morning and at bedtime. Apply to inner thighs or arms   famotidine (PEPCID) 40 MG tablet TAKE 1 TABLET(40 MG) BY MOUTH AT BEDTIME (Patient taking differently: Take 40 mg by mouth at bedtime.)   loratadine (CLARITIN) 10 MG tablet Take 10 mg by mouth daily.   methenamine (HIPREX) 1 g tablet Take 1 g by mouth 2 (two) times daily with a meal.    metoprolol succinate (TOPROL-XL) 100 MG 24 hr tablet Take 1 tablet (100 mg total) by mouth daily. Take with or immediately following a meal.   ondansetron (ZOFRAN-ODT) 4 MG disintegrating tablet DISSOLVE 1 TABLET(4 MG) ON THE TONGUE EVERY 8 HOURS AS NEEDED FOR NAUSEA OR VOMITING   Probiotic Product (PROBIOTIC PO) Take by mouth daily. 900 billion   RABEprazole (ACIPHEX) 20 MG tablet Take 20 mg by mouth  daily.   rosuvastatin (CRESTOR) 20 MG tablet Take 1 tablet (20 mg total) by mouth every evening.   sacubitril-valsartan (ENTRESTO) 24-26 MG Take 1 tablet by mouth 2 (two) times daily.   spironolactone (ALDACTONE) 25 MG tablet Take 0.5 tablets (12.5 mg total) by mouth daily.   Vitamin B Complex-C CAPS Take 1 capsule by mouth daily.     Review of Systems      All other systems reviewed and are otherwise negative except as noted above.  Physical Exam    VS:  BP (!) 146/88   Pulse 83   Ht 5\' 4"  (1.626 m)   Wt 154 lb (69.9 kg)   BMI 26.43 kg/m  , BMI Body mass index is 26.43 kg/m.  Wt Readings from Last 3 Encounters:  03/24/23 154 lb (69.9 kg)  03/05/23 158 lb (71.7 kg)  12/20/22 158 lb (71.7 kg)    GEN: Well nourished, well developed, in no acute distress. HEENT: normal. Neck: Supple, no JVD, carotid bruits, or masses. Cardiac: RRR, no murmurs, rubs, or gallops. No clubbing, cyanosis, edema.  Radials/PT 2+ and equal bilaterally.  Respiratory:  Respirations regular and unlabored, clear to auscultation bilaterally. GI: Soft, nontender, nondistended. MS: No deformity or atrophy. Skin: Warm and dry, no rash. Neuro:  Strength and sensation are intact. Psych: Normal affect. Anxious.   Assessment & Plan    CAD - Cardiac CTA 2018 moderate nonobstructive disease. Stable with no anginal symptoms. No indication for ischemic evaluation. GDMT includes aspirin, metoprolol, rosuvastatin. Heart healthy diet and regular cardiovascular exercise encouraged.  Refer to PREP exercise program  {The patient has an active order for outpatient cardiac rehabilitation.   Please indicate if the patient is ready to start. Do NOT delete this.  It will auto delete.  Refresh note, then sign.              Click here to document readiness and see contraindications.  :1}  Cardiac Rehabilitation Eligibility Assessment  The patient is ready to start cardiac rehabilitation from a cardiac standpoint.      Fatigue - Unclear if related to recent IBS flare or alternate etiology. CBC, BMP, thyroid panel today to assess for possible dehydration, anemia, thyroid abnormality as contributory.Marland Kitchen   HTN - BP not at goal <130/80. Increase Losartan to 50mg  QD. Discussed to  monitor BP at home at least 2 hours after medications and sitting for 5-10 minutes.   GERD - Follows with primary care and GI.  IBS - Continues to have flares and has follow up with GI next week.   HLD, LDL goal <70 - 06/2022 LDL 53. Continue Rosuvastatin 20mg  daily. Denies myalgias.   Palpitations - Well controlled on present dose Metoprolol. Refill provided.   MDD - Continue to follow with PCP. Has previously politely declined referral to psychology/psychiatry.   Mild dilation ascending aorta - Echo 2018 42mm. CT angio 09/2019 upper levlel of normal 39mm. No indication for repeat monitoring at this time as normalized. Continue optimal BP control and Rosuvastatin.   Disposition: Follow up in 1 month(s)  with Bryan Lemma, MD or APP.  Signed, Alver Sorrow, NP 03/24/2023, 3:20 PM [facility]

## 2023-03-24 NOTE — Patient Instructions (Addendum)
Medication Instructions:  Continue your current medications.   Your Entresto, Metoprolol Succinate, and Spironolactone help to strengthen your heart muscle.   *If you need a refill on your cardiac medications before your next appointment, please call your pharmacy*  Testing/Procedures: Your physician has requested that you have an echocardiogram in 3 months (early August). Echocardiography is a painless test that uses sound waves to create images of your heart. It provides your doctor with information about the size and shape of your heart and how well your heart's chambers and valves are working. This procedure takes approximately one hour. There are no restrictions for this procedure. Please do NOT wear cologne, perfume, aftershave, or lotions (deodorant is allowed). Please arrive 15 minutes prior to your appointment time.   Follow-Up: At [facility], you and your health needs are our priority.  As part of our continuing mission to provide you with exceptional heart care, we have created designated Provider Care Teams.  These Care Teams include your primary Cardiologist (physician) and Advanced Practice Providers (APPs -  Physician Assistants and Nurse Practitioners) who all work together to provide you with the care you need, when you need it.  We recommend signing up for the patient portal called "MyChart".  Sign up information is provided on this After Visit Summary.  MyChart is used to connect with patients for Virtual Visits (Telemedicine).  Patients are able to view lab/test results, encounter notes, upcoming appointments, etc.  Non-urgent messages can be sent to your provider as well.   To learn more about what you can do with MyChart, go to [email or web address].    Your next appointment:   1 month(s)  Provider:   Bryan Lemma, MD  or Alver Sorrow, NP    Other Instructions   Be sure to rest as you feel needed. The goal is to gradually increase your activity. It  will likely take longer than you are used to because of the reduced heart pumping function.   Recommend gradually increasing activity: Walk 10 minutes twice per day for a week Increase to 12 minutes twice per day for a week Increase to 14 minutes twice per day for a week Increase to 16 minutes twice per day for a week

## 2023-03-25 ENCOUNTER — Other Ambulatory Visit (HOSPITAL_BASED_OUTPATIENT_CLINIC_OR_DEPARTMENT_OTHER): Payer: Self-pay

## 2023-03-25 ENCOUNTER — Encounter (HOSPITAL_COMMUNITY): Payer: Medicare PPO

## 2023-03-25 ENCOUNTER — Encounter (HOSPITAL_BASED_OUTPATIENT_CLINIC_OR_DEPARTMENT_OTHER): Payer: Self-pay | Admitting: Family

## 2023-03-25 MED ORDER — METHENAMINE HIPPURATE 1 G PO TABS
1.0000 g | ORAL_TABLET | Freq: Two times a day (BID) | ORAL | 3 refills | Status: DC
  Filled 2023-03-25: qty 180, 90d supply, fill #0
  Filled 2023-06-16: qty 180, 90d supply, fill #1
  Filled 2023-09-07: qty 180, 90d supply, fill #2
  Filled 2023-12-09: qty 180, 90d supply, fill #3

## 2023-03-26 ENCOUNTER — Other Ambulatory Visit (HOSPITAL_BASED_OUTPATIENT_CLINIC_OR_DEPARTMENT_OTHER): Payer: Self-pay

## 2023-03-26 ENCOUNTER — Telehealth (HOSPITAL_COMMUNITY): Payer: Self-pay

## 2023-03-26 NOTE — Telephone Encounter (Signed)
Pt insurance is active and benefits verified through [facility]. Co-pay $20.00, DED $0.00/$0.00 met, out of pocket $4,000.00/$194.98 met, co-insurance 0%. No pre-authorization required. [employer]port, 03/26/23 @ 11:47AM, REF#[id number]   How many CR sessions are covered? (36 visits for TCR, 72 visits for ICR)72 Is this a lifetime maximum or an annual maximum? Annual Has the member used any of these services to date? No Is there a time limit (weeks/months) on start of program and/or program completion? No     Will contact patient to see if she is interested in the Cardiac Rehab Program.

## 2023-03-26 NOTE — Telephone Encounter (Signed)
Called patient to see if she was interested in participating in the Cardiac Rehab Program. Patient stated yes. Patient will come in for orientation on 04/03/23 @ 9:30AM and will attend the 8:15AM exercise class. Went over insurance, patient verbalized understanding.   Pensions consultant.

## 2023-03-27 ENCOUNTER — Telehealth: Payer: Self-pay | Admitting: Pharmacist

## 2023-03-27 DIAGNOSIS — I5181 Takotsubo syndrome: Secondary | ICD-10-CM

## 2023-03-27 DIAGNOSIS — I10 Essential (primary) hypertension: Secondary | ICD-10-CM

## 2023-03-27 NOTE — Telephone Encounter (Signed)
PharmD reviewed patient chart to assess eligibility for [facility] services. Patient was determined to be a good candidate for the program given the complexity of the medication regimen and/or overall risk for hospitalization and increased utilization.  Referral entered in order to outreach patient and offer appointment with PharmD. Referral cosigned to PCP.  

## 2023-03-28 ENCOUNTER — Telehealth: Payer: Self-pay

## 2023-03-28 NOTE — Telephone Encounter (Signed)
Reach back to pt reference starting PREP.  Has been referred to cardiac rehab and due to start 04/03/23 reports heart is only functioning at 35% Currently doing 10 min 2x a day walks Okay with me reaching back in about 10 wks to check on her as cardiac rehab may transition her back to me after [facility] is her preferred location Has my number for call back

## 2023-03-31 ENCOUNTER — Encounter (HOSPITAL_BASED_OUTPATIENT_CLINIC_OR_DEPARTMENT_OTHER): Payer: Self-pay

## 2023-03-31 NOTE — Telephone Encounter (Signed)
Please advise 

## 2023-04-01 ENCOUNTER — Telehealth (HOSPITAL_COMMUNITY): Payer: Self-pay

## 2023-04-01 ENCOUNTER — Ambulatory Visit (HOSPITAL_BASED_OUTPATIENT_CLINIC_OR_DEPARTMENT_OTHER): Payer: Medicare PPO | Admitting: Family

## 2023-04-01 NOTE — Telephone Encounter (Signed)
Reviewed with patient the Cardiac Rehab Cardiac Risk [facility] Nursing Assessment. Patient knows office location, and to wear comfortable clothing/closed-toed shoes. Recommended to patient to eat, take medications, and if diabetic, to check blood sugar before coming to classes. Explained orientation may take 2 hours.  

## 2023-04-03 ENCOUNTER — Other Ambulatory Visit (HOSPITAL_BASED_OUTPATIENT_CLINIC_OR_DEPARTMENT_OTHER): Payer: Self-pay

## 2023-04-03 ENCOUNTER — Encounter (HOSPITAL_COMMUNITY): Payer: Self-pay

## 2023-04-03 ENCOUNTER — Encounter (HOSPITAL_COMMUNITY)
Admission: RE | Admit: 2023-04-03 | Discharge: 2023-04-03 | Disposition: A | Discharge status: Home / Self Care | Payer: Medicare PPO | Source: Ambulatory Visit | Attending: Cardiology | Admitting: Cardiology

## 2023-04-03 VITALS — BP 112/70 | HR 64 | Ht 64.0 in | Wt 154.5 lb

## 2023-04-03 DIAGNOSIS — I214 Non-ST elevation (NSTEMI) myocardial infarction: Secondary | ICD-10-CM | POA: Diagnosis not present

## 2023-04-03 DIAGNOSIS — I5181 Takotsubo syndrome: Secondary | ICD-10-CM | POA: Insufficient documentation

## 2023-04-03 NOTE — Progress Notes (Signed)
Cardiac Individual Treatment Plan  Patient Details  Name: SAPHIRE BUFF MRN: [id number] Date of Birth: April 17, 1951 Referring Provider:   Flowsheet Row INTENSIVE CARDIAC REHAB ORIENT from 04/03/2023 in [facility]  Referring Provider Dr. Bryan Lemma MD       Initial Encounter Date:  Flowsheet Row INTENSIVE CARDIAC REHAB ORIENT from 04/03/2023 in [facility]  Date 04/03/23       Visit Diagnosis: 03/05/23 NSTEMI  03/05/23 Takotsubo cardiomyopathy  Patient's Home Medications on Admission:  Current Outpatient Medications:    acetaminophen (TYLENOL) 500 MG tablet, Take 500 mg by mouth every 6 (six) hours as needed., Disp: , Rfl:    aspirin 81 MG tablet, Take 1 tablet (81 mg total) by mouth daily., Disp: 30 tablet, Rfl:    Calcium Carbonate-Vitamin D 600-200 MG-UNIT CAPS, Take 1 capsule by mouth daily., Disp: , Rfl:    clidinium-chlordiazePOXIDE (LIBRAX) 5-2.5 MG capsule, Take 1 capsule by mouth 2 (two) times daily at 8 am and 10 pm., Disp: , Rfl:    Estradiol-Estriol-Progesterone (BIEST/PROGESTERONE TD), Place 2 mLs onto the skin in the morning and at bedtime. Apply to inner thighs or arms, Disp: , Rfl:    famotidine (PEPCID) 40 MG tablet, TAKE 1 TABLET(40 MG) BY MOUTH AT BEDTIME (Patient taking differently: Take 40 mg by mouth at bedtime.), Disp: 90 tablet, Rfl: 2   loratadine (CLARITIN) 10 MG tablet, Take 10 mg by mouth daily., Disp: , Rfl:    methenamine (HIPREX) 1 g tablet, Take 1 tablet (1 g total) by mouth 2 (two) times daily., Disp: 180 tablet, Rfl: 3   metoprolol succinate (TOPROL-XL) 100 MG 24 hr tablet, Take 1 tablet (100 mg total) by mouth daily. Take with or immediately following a meal., Disp: 90 tablet, Rfl: 3   ondansetron (ZOFRAN-ODT) 4 MG disintegrating tablet, DISSOLVE 1 TABLET(4 MG) ON THE TONGUE EVERY 8 HOURS AS NEEDED FOR NAUSEA OR VOMITING, Disp: 30  tablet, Rfl: 00   Probiotic Product (PROBIOTIC PO), Take by mouth daily. 900 billion, Disp: , Rfl:    RABEprazole (ACIPHEX) 20 MG tablet, Take 20 mg by mouth daily., Disp: , Rfl:    rosuvastatin (CRESTOR) 20 MG tablet, Take 1 tablet (20 mg total) by mouth every evening., Disp: 90 tablet, Rfl: 3   sacubitril-valsartan (ENTRESTO) 24-26 MG, Take 1 tablet by mouth 2 (two) times daily., Disp: 180 tablet, Rfl: 3   spironolactone (ALDACTONE) 25 MG tablet, Take 0.5 tablets (12.5 mg total) by mouth daily., Disp: 45 tablet, Rfl: 3   Vitamin B Complex-C CAPS, Take 1 capsule by mouth daily., Disp: , Rfl:   Past Medical History: Past Medical History:  Diagnosis Date   Abdominal aortic atherosclerosis (HCC) 10/2014   by CT scan   B12 deficiency    Chronic anxiety    Chronic cough    cyclical cough syndrome   Complication of anesthesia 9 yrs ago   awake and in severe pain with colonoscopy   Concussion    Diet-controlled diabetes mellitus (HCC) 02/22/2017   New dx 02/2017   GERD (gastroesophageal reflux disease)    severe leading to chronic cough and hoarseness   History of Epstein-Barr virus infection 2012   Hyperlipidemia    Hypertension    IBS (irritable bowel syndrome)    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, PB-8 probiotic, healthy diet and exercise   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  By CT 09/2017 & 09/2018 - max diameter 4cm f/u annually    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   had surgery with 5 days of chemotherapy   Osteopenia 01/2015   T -0.8 spine, -1.7 hip   Pyelonephritis due to Escherichia coli 07/16/2017   Uterine prolapse    Vocal cord paralysis, bilateral partial 2001   evaluated at [facility] - did not require surgery    Tobacco Use: Social History   Tobacco Use  Smoking Status Never  Smokeless Tobacco Never    Labs: Review Flowsheet  More data exists      Latest Ref Rng & Units 04/17/2021 03/19/2022 07/03/2022 11/08/2022 03/06/2023  Labs for ITP  Cardiac and Pulmonary Rehab  Cholestrol 0 - 200 mg/dL 161  - 096  - 045   LDL (calc) 0 - 99 mg/dL 64  - 53  - 71   Direct LDL 0 - 99 mg/dL - 69  - - -  HDL-C >40 mg/dL 98.11  - 91.47  - 37   Trlycerides <150 mg/dL 829.5  - 621.3  - 88   Hemoglobin A1c 4.8 - 5.6 % 6.6  - 6.4  6.1  6.2     Capillary Blood Glucose: Lab Results  Component Value Date   GLUCAP 136 (H) 07/20/2017   GLUCAP 116 (H) 07/20/2017   GLUCAP 108 (H) 07/19/2017   GLUCAP 119 (H) 07/19/2017   GLUCAP 109 (H) 07/19/2017     Exercise Target Goals: Exercise Program Goal: Individual exercise prescription set using results from initial 6 min walk test and THRR while considering  patient's activity barriers and safety.   Exercise Prescription Goal: Initial exercise prescription builds to 30-45 minutes a day of aerobic activity, 2-3 days per week.  Home exercise guidelines will be given to patient during program as part of exercise prescription that the participant will acknowledge.  Activity Barriers & Risk Stratification:  Activity Barriers & Cardiac Risk Stratification - 04/03/23 1205       Activity Barriers & Cardiac Risk Stratification   Activity Barriers Deconditioning;Balance Concerns;Shortness of Breath    Cardiac Risk Stratification High             6 Minute Walk:  6 Minute Walk     Row Name 04/03/23 1146         6 Minute Walk   Phase Initial     Distance 1731 feet     Walk Time 6 minutes     # of Rest Breaks 0     MPH 3.28     METS 3.69     RPE 11     Perceived Dyspnea  1     VO2 Peak 12.91     Symptoms Yes (comment)     Comments Pre walk test patient SOBx1 SaO2 98% possible anxiety. Nurse aware, lung sounds clear. Resolved with rest. Patient felt good on the walk test, SOB x1 post, resolved with rest     Resting HR 64 bpm     Resting BP 112/70     Resting Oxygen Saturation  98 %     Exercise Oxygen Saturation  during 6 min walk 98 %     Max Ex. HR 97 bpm     Max Ex. BP 156/62     2  Minute Post BP 134/68              Oxygen Initial Assessment:   Oxygen Re-Evaluation:   Oxygen Discharge (Final Oxygen Re-Evaluation):  Initial Exercise Prescription:  Initial Exercise Prescription - 04/03/23 1200       Date of Initial Exercise RX and Referring Provider   Date 04/03/23    Referring Provider Dr. Bryan Lemma MD    Expected Discharge Date 06/13/23      NuStep   Level 2    SPM 80    Minutes 15    METs 2.2      Track   Laps 22    Minutes 15    METs 2.9      Prescription Details   Frequency (times per week) 3    Duration Progress to 30 minutes of continuous aerobic without signs/symptoms of physical distress      Intensity   THRR 40-80% of Max Heartrate 59-118    Ratings of Perceived Exertion 11-13    Perceived Dyspnea 0-4      Progression   Progression Continue progressive overload as per policy without signs/symptoms or physical distress.      Resistance Training   Training Prescription Yes    Weight 2    Reps 10-15             Perform Capillary Blood Glucose checks as needed.  Exercise Prescription Changes:   Exercise Comments:   Exercise Goals and Review:   Exercise Goals     Row Name 04/03/23 1208             Exercise Goals   Increase Physical Activity Yes       Intervention Provide advice, education, support and counseling about physical activity/exercise needs.;Develop an individualized exercise prescription for aerobic and resistive training based on initial evaluation findings, risk stratification, comorbidities and participant's personal goals.       Expected Outcomes Short Term: Attend rehab on a regular basis to increase amount of physical activity.;Long Term: Exercising regularly at least 3-5 days a week.;Long Term: Add in home exercise to make exercise part of routine and to increase amount of physical activity.       Increase Strength and Stamina Yes       Intervention Provide advice, education, support  and counseling about physical activity/exercise needs.;Develop an individualized exercise prescription for aerobic and resistive training based on initial evaluation findings, risk stratification, comorbidities and participant's personal goals.       Expected Outcomes Short Term: Increase workloads from initial exercise prescription for resistance, speed, and METs.;Short Term: Perform resistance training exercises routinely during rehab and add in resistance training at home;Long Term: Improve cardiorespiratory fitness, muscular endurance and strength as measured by increased METs and functional capacity ( )       Able to understand and use rate of perceived exertion (RPE) scale Yes       Intervention Provide education and explanation on how to use RPE scale       Expected Outcomes Short Term: Able to use RPE daily in rehab to express subjective intensity level;Long Term:  Able to use RPE to guide intensity level when exercising independently       Knowledge and understanding of Target Heart Rate Range (THRR) Yes       Intervention Provide education and explanation of THRR including how the numbers were predicted and where they are located for reference       Expected Outcomes Short Term: Able to state/look up THRR;Long Term: Able to use THRR to govern intensity when exercising independently;Short Term: Able to use daily as guideline for intensity in rehab       Understanding  of Exercise Prescription Yes       Intervention Provide education, explanation, and written materials on patient's individual exercise prescription       Expected Outcomes Short Term: Able to explain program exercise prescription;Long Term: Able to explain home exercise prescription to exercise independently                Exercise Goals Re-Evaluation :   Discharge Exercise Prescription (Final Exercise Prescription Changes):   Nutrition:  Target Goals: Understanding of nutrition guidelines, daily intake of sodium  1500mg , cholesterol 200mg , calories 30% from fat and 7% or less from saturated fats, daily to have 5 or more servings of fruits and vegetables.  Biometrics:  Pre Biometrics - 04/03/23 1144       Pre Biometrics   Waist Circumference 37 inches    Hip Circumference 40.5 inches    Waist to Hip Ratio 0.91 %    Triceps Skinfold 19 mm    % Body Fat 37.3 %    Grip Strength 24 kg    Flexibility 12.25 in    Single Leg Stand 4.5 seconds              Nutrition Therapy Plan and Nutrition Goals:   Nutrition Assessments:  MEDIFICTS Score Key: ?70 Need to make dietary changes  40-70 Heart Healthy Diet ? 40 Therapeutic Level Cholesterol Diet    Picture Your Plate Scores: <16 Unhealthy dietary pattern with much room for improvement. 41-50 Dietary pattern unlikely to meet recommendations for good health and room for improvement. 51-60 More healthful dietary pattern, with some room for improvement.  >60 Healthy dietary pattern, although there may be some specific behaviors that could be improved.    Nutrition Goals Re-Evaluation:   Nutrition Goals Re-Evaluation:   Nutrition Goals Discharge (Final Nutrition Goals Re-Evaluation):   Psychosocial: Target Goals: Acknowledge presence or absence of significant depression and/or stress, maximize coping skills, provide positive support system. Participant is able to verbalize types and ability to use techniques and skills needed for reducing stress and depression.  Initial Review & Psychosocial Screening:  Initial Psych Review & Screening - 04/03/23 1101       Initial Review   Current issues with Current Depression;Current Stress Concerns    Source of Stress Concerns Chronic Illness;Family    Comments Pam says she is currently depressed  and angry due to her recent cardiac event and diagnosis. Pam is also concerened about her husband as he may need colon surgery this summer. Pam is not currently taking an antidepressant and is not  interested counselling. i offered to get an appointment with Pam's PCP Dr Renee Ramus she declines at this time and says that he is aware of her suituation. Pam said her sister commited suicide 23 years ago she would never take her own life as she know it would cause alot of pain and hardship for her family.      Family Dynamics   Good Support System? Yes   Pam has her husband, children and grandchildren for support     Barriers   Psychosocial barriers to participate in program The patient should benefit from training in stress management and relaxation.      Screening Interventions   Interventions Encouraged to exercise;Provide feedback about the scores to participant;To provide support and resources with identified psychosocial needs    Expected Outcomes Long Term Goal: Stressors or current issues are controlled or eliminated.;Short Term goal: Identification and review with participant of any Quality of Life or Depression  concerns found by scoring the questionnaire.;Long Term goal: The participant improves quality of Life and PHQ9 Scores as seen by post scores and/or verbalization of changes             Quality of Life Scores:  Quality of Life - 04/03/23 1209       Quality of Life   Select Quality of Life      Quality of Life Scores   Health/Function Pre 11.2 %    Socioeconomic Pre 27.86 %    Psych/Spiritual Pre 6.86 %    Family Pre 21.6 %    GLOBAL Pre 15.26 %            Scores of 19 and below usually indicate a poorer quality of life in these areas.  A difference of  2-3 points is a clinically meaningful difference.  A difference of 2-3 points in the total score of the Quality of Life Index has been associated with significant improvement in overall quality of life, self-image, physical symptoms, and general health in studies assessing change in quality of life.  PHQ-9: Review Flowsheet  More data exists      04/03/2023 11/08/2022 04/17/2022 02/20/2022 12/24/2021  Depression  screen PHQ 2/9  Decreased Interest 0 0 1 0 3 1  Down, Depressed, Hopeless 3 0 1 3 1   PHQ - 2 Score 3 0 2 0 6 2  Altered sleeping 1 0 3 3 1   Tired, decreased energy 3 3 2 3 2   Change in appetite 1 0 0 3 2  Feeling bad or failure about yourself  3 0 1 3 2   Trouble concentrating 3 1 1 3 2   Moving slowly or fidgety/restless 1 0 0 3 0  Suicidal thoughts 0 0 0 2 0  PHQ-9 Score 15 4 9 26 11   Difficult doing work/chores Somewhat difficult Somewhat difficult Somewhat difficult Somewhat difficult -   Interpretation of Total Score  Total Score Depression Severity:  1-4 = Minimal depression, 5-9 = Mild depression, 10-14 = Moderate depression, 15-19 = Moderately severe depression, 20-27 = Severe depression   Psychosocial Evaluation and Intervention:   Psychosocial Re-Evaluation:   Psychosocial Discharge (Final Psychosocial Re-Evaluation):   Vocational Rehabilitation: Provide vocational rehab assistance to qualifying candidates.   Vocational Rehab Evaluation & Intervention:  Vocational Rehab - 04/03/23 1107       Initial Vocational Rehab Evaluation & Intervention   Assessment shows need for Vocational Rehabilitation No   Pam is retired and does not need vocational rehab at this time            Education: Education Goals: Education classes will be provided on a weekly basis, covering required topics. Participant will state understanding/return demonstration of topics presented.     Core Videos: Exercise    Move It!  Clinical staff conducted group or individual video education with verbal and written material and guidebook.  Patient learns the recommended Pritikin exercise program. Exercise with the goal of living a long, healthy life. Some of the health benefits of exercise include controlled diabetes, healthier blood pressure levels, improved cholesterol levels, improved heart and lung capacity, improved sleep, and better body composition. Everyone should speak with their doctor  before starting or changing an exercise routine.  Biomechanical Limitations Clinical staff conducted group or individual video education with verbal and written material and guidebook.  Patient learns how biomechanical limitations can impact exercise and how we can mitigate and possibly overcome limitations to have an impactful and balanced exercise routine.  Body Composition Clinical staff conducted group or individual video education with verbal and written material and guidebook.  Patient learns that body composition (ratio of muscle mass to fat mass) is a key component to assessing overall fitness, rather than body weight alone. Increased fat mass, especially visceral belly fat, can put [place] at increased risk for metabolic syndrome, type 2 diabetes, heart disease, and even death. It is recommended to combine diet and exercise (cardiovascular and resistance training) to improve your body composition. Seek guidance from your physician and exercise physiologist before implementing an exercise routine.  Exercise Action Plan Clinical staff conducted group or individual video education with verbal and written material and guidebook.  Patient learns the recommended strategies to achieve and enjoy long-term exercise adherence, including variety, self-motivation, self-efficacy, and positive decision making. Benefits of exercise include fitness, good health, weight management, more energy, better sleep, less stress, and overall well-being.  Medical   Heart Disease Risk Reduction Clinical staff conducted group or individual video education with verbal and written material and guidebook.  Patient learns our heart is our most vital organ as it circulates oxygen, nutrients, white blood cells, and hormones throughout the entire body, and carries waste away. Data supports a plant-based eating plan like the Pritikin Program for its effectiveness in slowing progression of and reversing heart disease. The video  provides a number of recommendations to address heart disease.   Metabolic Syndrome and Belly Fat  Clinical staff conducted group or individual video education with verbal and written material and guidebook.  Patient learns what metabolic syndrome is, how it leads to heart disease, and how one can reverse it and keep it from coming back. You have metabolic syndrome if you have 3 of the following 5 criteria: abdominal obesity, high blood pressure, high triglycerides, low HDL cholesterol, and high blood sugar.  Hypertension and Heart Disease Clinical staff conducted group or individual video education with verbal and written material and guidebook.  Patient learns that high blood pressure, or hypertension, is very common in the [place]. Hypertension is largely due to excessive salt intake, but other important risk factors include being overweight, physical inactivity, drinking too much alcohol, smoking, and not eating enough potassium from fruits and vegetables. High blood pressure is a leading risk factor for heart attack, stroke, congestive heart failure, dementia, kidney failure, and premature death. Long-term effects of excessive salt intake include stiffening of the arteries and thickening of heart muscle and organ damage. Recommendations include ways to reduce hypertension and the risk of heart disease.  Diseases of Our Time - Focusing on Diabetes Clinical staff conducted group or individual video education with verbal and written material and guidebook.  Patient learns why the best way to stop diseases of our time is prevention, through food and other lifestyle changes. Medicine (such as prescription pills and surgeries) is often only a Band-Aid on the problem, not a long-term solution. Most common diseases of our time include obesity, type 2 diabetes, hypertension, heart disease, and cancer. The Pritikin Program is recommended and has been proven to help reduce, reverse, and/or prevent the  damaging effects of metabolic syndrome.  Nutrition   Overview of the Pritikin Eating Plan  Clinical staff conducted group or individual video education with verbal and written material and guidebook.  Patient learns about the Pritikin Eating Plan for disease risk reduction. The Pritikin Eating Plan emphasizes a wide variety of unrefined, minimally-processed carbohydrates, like fruits, vegetables, whole grains, and legumes. Go, Caution, and Stop food choices are  explained. Plant-based and lean animal proteins are emphasized. Rationale provided for low sodium intake for blood pressure control, low added sugars for blood sugar stabilization, and low added fats and oils for coronary artery disease risk reduction and weight management.  Calorie Density  Clinical staff conducted group or individual video education with verbal and written material and guidebook.  Patient learns about calorie density and how it impacts the Pritikin Eating Plan. Knowing the characteristics of the food you choose will help you decide whether those foods will lead to weight gain or weight loss, and whether you want to consume more or less of them. Weight loss is usually a side effect of the Pritikin Eating Plan because of its focus on low calorie-dense foods.  Label Reading  Clinical staff conducted group or individual video education with verbal and written material and guidebook.  Patient learns about the Pritikin recommended label reading guidelines and corresponding recommendations regarding calorie density, added sugars, sodium content, and whole grains.  Dining Out - Part 1  Clinical staff conducted group or individual video education with verbal and written material and guidebook.  Patient learns that restaurant meals can be sabotaging because they can be so high in calories, fat, sodium, and/or sugar. Patient learns recommended strategies on how to positively address this and avoid unhealthy pitfalls.  Facts on Fats   Clinical staff conducted group or individual video education with verbal and written material and guidebook.  Patient learns that lifestyle modifications can be just as effective, if not more so, as many medications for lowering your risk of heart disease. A Pritikin lifestyle can help to reduce your risk of inflammation and atherosclerosis (cholesterol build-up, or plaque, in the artery walls). Lifestyle interventions such as dietary choices and physical activity address the cause of atherosclerosis. A review of the types of fats and their impact on blood cholesterol levels, along with dietary recommendations to reduce fat intake is also included.  Nutrition Action Plan  Clinical staff conducted group or individual video education with verbal and written material and guidebook.  Patient learns how to incorporate Pritikin recommendations into their lifestyle. Recommendations include planning and keeping personal health goals in mind as an important part of their success.  Healthy Mind-Set    Healthy Minds, Bodies, Hearts  Clinical staff conducted group or individual video education with verbal and written material and guidebook.  Patient learns how to identify when they are stressed. Video will discuss the impact of that stress, as well as the many benefits of stress management. Patient will also be introduced to stress management techniques. The way we think, act, and feel has an impact on our hearts.  How Our Thoughts Can Heal Our Hearts  Clinical staff conducted group or individual video education with verbal and written material and guidebook.  Patient learns that negative thoughts can cause depression and anxiety. This can result in negative lifestyle behavior and serious health problems. Cognitive behavioral therapy is an effective method to help control our thoughts in order to change and improve our emotional outlook.  Additional Videos:  Exercise    Improving Performance  Clinical  staff conducted group or individual video education with verbal and written material and guidebook.  Patient learns to use a non-linear approach by alternating intensity levels and lengths of time spent exercising to help burn more calories and lose more body fat. Cardiovascular exercise helps improve heart health, metabolism, hormonal balance, blood sugar control, and recovery from fatigue. Resistance training improves strength, endurance, balance, coordination,  reaction time, metabolism, and muscle mass. Flexibility exercise improves circulation, posture, and balance. Seek guidance from your physician and exercise physiologist before implementing an exercise routine and learn your capabilities and proper form for all exercise.  Introduction to Yoga  Clinical staff conducted group or individual video education with verbal and written material and guidebook.  Patient learns about yoga, a discipline of the coming together of mind, breath, and body. The benefits of yoga include improved flexibility, improved range of motion, better posture and core strength, increased lung function, weight loss, and positive self-image. Yoga's heart health benefits include lowered blood pressure, healthier heart rate, decreased cholesterol and triglyceride levels, improved immune function, and reduced stress. Seek guidance from your physician and exercise physiologist before implementing an exercise routine and learn your capabilities and proper form for all exercise.  Medical   Aging: Enhancing Your Quality of Life  Clinical staff conducted group or individual video education with verbal and written material and guidebook.  Patient learns key strategies and recommendations to stay in good physical health and enhance quality of life, such as prevention strategies, having an advocate, securing a Health Care Proxy and Power of Attorney, and keeping a list of medications and system for tracking them. It also discusses how to  avoid risk for bone loss.  Biology of Weight Control  Clinical staff conducted group or individual video education with verbal and written material and guidebook.  Patient learns that weight gain occurs because we consume more calories than we burn (eating more, moving less). Even if your body weight is normal, you may have higher ratios of fat compared to muscle mass. Too much body fat puts you at increased risk for cardiovascular disease, heart attack, stroke, type 2 diabetes, and obesity-related cancers. In addition to exercise, following the Pritikin Eating Plan can help reduce your risk.  Decoding Lab Results  Clinical staff conducted group or individual video education with verbal and written material and guidebook.  Patient learns that lab test reflects one measurement whose values change over time and are influenced by many factors, including medication, stress, sleep, exercise, food, hydration, pre-existing medical conditions, and more. It is recommended to use the knowledge from this video to become more involved with your lab results and evaluate your numbers to speak with your doctor.   Diseases of Our Time - Overview  Clinical staff conducted group or individual video education with verbal and written material and guidebook.  Patient learns that according to the CDC, 50% to 70% of chronic diseases (such as obesity, type 2 diabetes, elevated lipids, hypertension, and heart disease) are avoidable through lifestyle improvements including healthier food choices, listening to satiety cues, and increased physical activity.  Sleep Disorders Clinical staff conducted group or individual video education with verbal and written material and guidebook.  Patient learns how good quality and duration of sleep are important to overall health and well-being. Patient also learns about sleep disorders and how they impact health along with recommendations to address them, including discussing with a  physician.  Nutrition  Dining Out - Part 2 Clinical staff conducted group or individual video education with verbal and written material and guidebook.  Patient learns how to plan ahead and communicate in order to maximize their dining experience in a healthy and nutritious manner. Included are recommended food choices based on the type of restaurant the patient is visiting.   Fueling a Banker conducted group or individual video education with verbal and written material  and guidebook.  There is a strong connection between our food choices and our health. Diseases like obesity and type 2 diabetes are very prevalent and are in large-part due to lifestyle choices. The Pritikin Eating Plan provides plenty of food and hunger-curbing satisfaction. It is easy to follow, affordable, and helps reduce health risks.  Menu Workshop  Clinical staff conducted group or individual video education with verbal and written material and guidebook.  Patient learns that restaurant meals can sabotage health goals because they are often packed with calories, fat, sodium, and sugar. Recommendations include strategies to plan ahead and to communicate with the manager, chef, or server to help order a healthier meal.  Planning Your Eating Strategy  Clinical staff conducted group or individual video education with verbal and written material and guidebook.  Patient learns about the Pritikin Eating Plan and its benefit of reducing the risk of disease. The Pritikin Eating Plan does not focus on calories. Instead, it emphasizes high-quality, nutrient-rich foods. By knowing the characteristics of the foods, we choose, we can determine their calorie density and make informed decisions.  Targeting Your Nutrition Priorities  Clinical staff conducted group or individual video education with verbal and written material and guidebook.  Patient learns that lifestyle habits have a tremendous impact on disease  risk and progression. This video provides eating and physical activity recommendations based on your personal health goals, such as reducing LDL cholesterol, losing weight, preventing or controlling type 2 diabetes, and reducing high blood pressure.  Vitamins and Minerals  Clinical staff conducted group or individual video education with verbal and written material and guidebook.  Patient learns different ways to obtain key vitamins and minerals, including through a recommended healthy diet. It is important to discuss all supplements you take with your doctor.   Healthy Mind-Set    Smoking Cessation  Clinical staff conducted group or individual video education with verbal and written material and guidebook.  Patient learns that cigarette smoking and tobacco addiction pose a serious health risk which affects millions of people. Stopping smoking will significantly reduce the risk of heart disease, lung disease, and many forms of cancer. Recommended strategies for quitting are covered, including working with your doctor to develop a successful plan.  Culinary   Becoming a Set designer conducted group or individual video education with verbal and written material and guidebook.  Patient learns that cooking at home can be healthy, cost-effective, quick, and puts them in control. Keys to cooking healthy recipes will include looking at your recipe, assessing your equipment needs, planning ahead, making it simple, choosing cost-effective seasonal ingredients, and limiting the use of added fats, salts, and sugars.  Cooking - Breakfast and Snacks  Clinical staff conducted group or individual video education with verbal and written material and guidebook.  Patient learns how important breakfast is to satiety and nutrition through the entire day. Recommendations include key foods to eat during breakfast to help stabilize blood sugar levels and to prevent overeating at meals later in the day.  Planning ahead is also a key component.  Cooking - Educational psychologist conducted group or individual video education with verbal and written material and guidebook.  Patient learns eating strategies to improve overall health, including an approach to cook more at home. Recommendations include thinking of animal protein as a side on your plate rather than center stage and focusing instead on lower calorie dense options like vegetables, fruits, whole grains, and plant-based proteins, such as  beans. Making sauces in large quantities to freeze for later and leaving the skin on your vegetables are also recommended to maximize your experience.  Cooking - Healthy Salads and Dressing Clinical staff conducted group or individual video education with verbal and written material and guidebook.  Patient learns that vegetables, fruits, whole grains, and legumes are the foundations of the Pritikin Eating Plan. Recommendations include how to incorporate each of these in flavorful and healthy salads, and how to create homemade salad dressings. Proper handling of ingredients is also covered. Cooking - Soups and [employer] - Soups and Desserts Clinical staff conducted group or individual video education with verbal and written material and guidebook.  Patient learns that Pritikin soups and desserts make for easy, nutritious, and delicious snacks and meal components that are low in sodium, fat, sugar, and calorie density, while high in vitamins, minerals, and filling fiber. Recommendations include simple and healthy ideas for soups and desserts.   Overview     The Pritikin Solution Program Overview Clinical staff conducted group or individual video education with verbal and written material and guidebook.  Patient learns that the results of the Pritikin Program have been documented in more than 100 articles published in peer-reviewed journals, and the benefits include reducing risk factors for  (and, in some cases, even reversing) high cholesterol, high blood pressure, type 2 diabetes, obesity, and more! An overview of the three key pillars of the Pritikin Program will be covered: eating well, doing regular exercise, and having a healthy mind-set.  WORKSHOPS  Exercise: Exercise Basics: Building Your Action Plan Clinical staff led group instruction and group discussion with PowerPoint presentation and patient guidebook. To enhance the learning environment the use of posters, models and videos may be added. At the conclusion of this workshop, patients will comprehend the difference between physical activity and exercise, as well as the benefits of incorporating both, into their routine. Patients will understand the FITT (Frequency, Intensity, Time, and Type) principle and how to use it to build an exercise action plan. In addition, safety concerns and other considerations for exercise and cardiac rehab will be addressed by the presenter. The purpose of this lesson is to promote a comprehensive and effective weekly exercise routine in order to improve patients' overall level of fitness.   Managing Heart Disease: Your Path to a Healthier Heart Clinical staff led group instruction and group discussion with PowerPoint presentation and patient guidebook. To enhance the learning environment the use of posters, models and videos may be added.At the conclusion of this workshop, patients will understand the anatomy and physiology of the heart. Additionally, they will understand how Pritikin's three pillars impact the risk factors, the progression, and the management of heart disease.  The purpose of this lesson is to provide a high-level overview of the heart, heart disease, and how the Pritikin lifestyle positively impacts risk factors.  Exercise Biomechanics Clinical staff led group instruction and group discussion with PowerPoint presentation and patient guidebook. To enhance the learning  environment the use of posters, models and videos may be added. Patients will learn how the structural parts of their bodies function and how these functions impact their daily activities, movement, and exercise. Patients will learn how to promote a neutral spine, learn how to manage pain, and identify ways to improve their physical movement in order to promote healthy living. The purpose of this lesson is to expose patients to common physical limitations that impact physical activity. Participants will learn practical ways to adapt  and manage aches and pains, and to minimize their effect on regular exercise. Patients will learn how to maintain good posture while sitting, walking, and lifting.  Balance Training and Fall Prevention  Clinical staff led group instruction and group discussion with PowerPoint presentation and patient guidebook. To enhance the learning environment the use of posters, models and videos may be added. At the conclusion of this workshop, patients will understand the importance of their sensorimotor skills (vision, proprioception, and the vestibular system) in maintaining their ability to balance as they age. Patients will apply a variety of balancing exercises that are appropriate for their current level of function. Patients will understand the common causes for poor balance, possible solutions to these problems, and ways to modify their physical environment in order to minimize their fall risk. The purpose of this lesson is to teach patients about the importance of maintaining balance as they age and ways to minimize their risk of falling.  WORKSHOPS   Nutrition:  Fueling a Ship broker led group instruction and group discussion with PowerPoint presentation and patient guidebook. To enhance the learning environment the use of posters, models and videos may be added. Patients will review the foundational principles of the Pritikin Eating Plan and understand  what constitutes a serving size in each of the food groups. Patients will also learn Pritikin-friendly foods that are better choices when away from home and review make-ahead meal and snack options. Calorie density will be reviewed and applied to three nutrition priorities: weight maintenance, weight loss, and weight gain. The purpose of this lesson is to reinforce (in a group setting) the key concepts around what patients are recommended to eat and how to apply these guidelines when away from home by planning and selecting Pritikin-friendly options. Patients will understand how calorie density may be adjusted for different weight management goals.  Mindful Eating  Clinical staff led group instruction and group discussion with PowerPoint presentation and patient guidebook. To enhance the learning environment the use of posters, models and videos may be added. Patients will briefly review the concepts of the Pritikin Eating Plan and the importance of low-calorie dense foods. The concept of mindful eating will be introduced as well as the importance of paying attention to internal hunger signals. Triggers for non-hunger eating and techniques for dealing with triggers will be explored. The purpose of this lesson is to provide patients with the opportunity to review the basic principles of the Pritikin Eating Plan, discuss the value of eating mindfully and how to measure internal cues of hunger and fullness using the Hunger Scale. Patients will also discuss reasons for non-hunger eating and learn strategies to use for controlling emotional eating.  Targeting Your Nutrition Priorities Clinical staff led group instruction and group discussion with PowerPoint presentation and patient guidebook. To enhance the learning environment the use of posters, models and videos may be added. Patients will learn how to determine their genetic susceptibility to disease by reviewing their family history. Patients will gain  insight into the importance of diet as part of an overall healthy lifestyle in mitigating the impact of genetics and other environmental insults. The purpose of this lesson is to provide patients with the opportunity to assess their personal nutrition priorities by looking at their family history, their own health history and current risk factors. Patients will also be able to discuss ways of prioritizing and modifying the Pritikin Eating Plan for their highest risk areas  Menu  Clinical staff led group instruction and  group discussion with PowerPoint presentation and patient guidebook. To enhance the learning environment the use of posters, models and videos may be added. Using menus brought in from [employer], or printed from [employer], patients will apply the Pritikin dining out guidelines that were presented in the [employer] video. Patients will also be able to practice these guidelines in a variety of provided scenarios. The purpose of this lesson is to provide patients with the opportunity to practice hands-on learning of the Pritikin Dining Out guidelines with actual menus and practice scenarios.  Label Reading Clinical staff led group instruction and group discussion with PowerPoint presentation and patient guidebook. To enhance the learning environment the use of posters, models and videos may be added. Patients will review and discuss the Pritikin label reading guidelines presented in Pritikin's Label Reading Educational series video. Using fool labels brought in from local grocery stores and markets, patients will apply the label reading guidelines and determine if the packaged food meet the Pritikin guidelines. The purpose of this lesson is to provide patients with the opportunity to review, discuss, and practice hands-on learning of the Pritikin Label Reading guidelines with actual packaged food labels. Cooking School  Pritikin's [employer] are  designed to teach patients ways to prepare quick, simple, and affordable recipes at home. The importance of nutrition's role in chronic disease risk reduction is reflected in its emphasis in the overall Pritikin program. By learning how to prepare essential core Pritikin Eating Plan recipes, patients will increase control over what they eat; be able to customize the flavor of foods without the use of added salt, sugar, or fat; and improve the quality of the food they consume. By learning a set of core recipes which are easily assembled, quickly prepared, and affordable, patients are more likely to prepare more healthy foods at home. These workshops focus on convenient breakfasts, simple entres, side dishes, and desserts which can be prepared with minimal effort and are consistent with nutrition recommendations for cardiovascular risk reduction. Cooking [employer] are taught by a Armed forces logistics/support/administrative officer (RD) who has been trained by the [employer]. The chef or RD has a clear understanding of the importance of minimizing - if not completely eliminating - added fat, sugar, and sodium in recipes. Throughout the series of Cooking School Workshop sessions, patients will learn about healthy ingredients and efficient methods of cooking to build confidence in their capability to prepare    Cooking School weekly topics:  Adding Flavor- Sodium-Free  Fast and Healthy Breakfasts  Powerhouse Plant-Based Proteins  Satisfying Salads and Dressings  Simple Sides and Sauces  International Cuisine-Spotlight on the [employer] Zones  Delicious Desserts  Savory Soups  [employer] - Meals in a Astronomer Appetizers and Snacks  Comforting Weekend Breakfasts  One-Pot Wonders   Fast Evening Meals  Landscape architect Your Pritikin Plate  WORKSHOPS   Healthy Mindset (Psychosocial):  Focused Goals, Sustainable Changes Clinical staff led group instruction and group discussion  with PowerPoint presentation and patient guidebook. To enhance the learning environment the use of posters, models and videos may be added. Patients will be able to apply effective goal setting strategies to establish at least one personal goal, and then take consistent, meaningful action toward that goal. They will learn to identify common barriers to achieving personal goals and develop strategies to overcome them. Patients will also gain an understanding of how our mind-set can impact our ability to achieve goals  and the importance of cultivating a positive and growth-oriented mind-set. The purpose of this lesson is to provide patients with a deeper understanding of how to set and achieve personal goals, as well as the tools and strategies needed to overcome common obstacles which may arise along the way.  From Head to Heart: The Power of a Healthy Outlook  Clinical staff led group instruction and group discussion with PowerPoint presentation and patient guidebook. To enhance the learning environment the use of posters, models and videos may be added. Patients will be able to recognize and describe the impact of emotions and mood on physical health. They will discover the importance of self-care and explore self-care practices which may work for them. Patients will also learn how to utilize the 4 C's to cultivate a healthier outlook and better manage stress and challenges. The purpose of this lesson is to demonstrate to patients how a healthy outlook is an essential part of maintaining good health, especially as they continue their cardiac rehab journey.  Healthy Sleep for a Healthy Heart Clinical staff led group instruction and group discussion with PowerPoint presentation and patient guidebook. To enhance the learning environment the use of posters, models and videos may be added. At the conclusion of this workshop, patients will be able to demonstrate knowledge of the importance of sleep to overall  health, well-being, and quality of life. They will understand the symptoms of, and treatments for, common sleep disorders. Patients will also be able to identify daytime and nighttime behaviors which impact sleep, and they will be able to apply these tools to help manage sleep-related challenges. The purpose of this lesson is to provide patients with a general overview of sleep and outline the importance of quality sleep. Patients will learn about a few of the most common sleep disorders. Patients will also be introduced to the concept of "sleep hygiene," and discover ways to self-manage certain sleeping problems through simple daily behavior changes. Finally, the workshop will motivate patients by clarifying the links between quality sleep and their goals of heart-healthy living.   Recognizing and Reducing Stress Clinical staff led group instruction and group discussion with PowerPoint presentation and patient guidebook. To enhance the learning environment the use of posters, models and videos may be added. At the conclusion of this workshop, patients will be able to understand the types of stress reactions, differentiate between acute and chronic stress, and recognize the impact that chronic stress has on their health. They will also be able to apply different coping mechanisms, such as reframing negative self-talk. Patients will have the opportunity to practice a variety of stress management techniques, such as deep abdominal breathing, progressive muscle relaxation, and/or guided imagery.  The purpose of this lesson is to educate patients on the role of stress in their lives and to provide healthy techniques for coping with it.  Learning Barriers/Preferences:  Learning Barriers/Preferences - 04/03/23 1213       Learning Barriers/Preferences   Learning Barriers Exercise Concerns;Hearing   HOH and balance concerns   Learning Preferences Computer/Internet;Group Instruction;Individual Instruction;Skilled  Demonstration;Video;Written Material;Pictoral             Education Topics:  Knowledge Questionnaire Score:  Knowledge Questionnaire Score - 04/03/23 1214       Knowledge Questionnaire Score   Pre Score 21/24             Core Components/Risk Factors/Patient Goals at Admission:  Personal Goals and Risk Factors at Admission - 04/03/23 1216  Core Components/Risk Factors/Patient Goals on Admission    Weight Management Yes;Weight Loss    Intervention Weight Management: Develop a combined nutrition and exercise program designed to reach desired caloric intake, while maintaining appropriate intake of nutrient and fiber, sodium and fats, and appropriate energy expenditure required for the weight goal.;Weight Management: Provide education and appropriate resources to help participant work on and attain dietary goals.;Weight Management/Obesity: Establish reasonable short term and long term weight goals.;Obesity: Provide education and appropriate resources to help participant work on and attain dietary goals.    Admit Weight 154 lb 8.7 oz (70.1 kg)    Hypertension Yes    Intervention Provide education on lifestyle modifcations including regular physical activity/exercise, weight management, moderate sodium restriction and increased consumption of fresh fruit, vegetables, and low fat dairy, alcohol moderation, and smoking cessation.;Monitor prescription use compliance.    Expected Outcomes Short Term: Continued assessment and intervention until BP is < 140/53mm HG in hypertensive participants. < 130/44mm HG in hypertensive participants with diabetes, heart failure or chronic kidney disease.;Long Term: Maintenance of blood pressure at goal levels.    Lipids Yes    Intervention Provide education and support for participant on nutrition & aerobic/resistive exercise along with prescribed medications to achieve LDL 70mg , HDL >40mg .    Expected Outcomes Short Term: Participant states  understanding of desired cholesterol values and is compliant with medications prescribed. Participant is following exercise prescription and nutrition guidelines.;Long Term: Cholesterol controlled with medications as prescribed, with individualized exercise RX and with personalized nutrition plan. Value goals: LDL < 70mg , HDL > 40 mg.    Stress Yes    Intervention Offer individual and/or small group education and counseling on adjustment to heart disease, stress management and health-related lifestyle change. Teach and support self-help strategies.;Refer participants experiencing significant psychosocial distress to appropriate mental health specialists for further evaluation and treatment. When possible, include family members and significant others in education/counseling sessions.    Expected Outcomes Short Term: Participant demonstrates changes in health-related behavior, relaxation and other stress management skills, ability to obtain effective social support, and compliance with psychotropic medications if prescribed.;Long Term: Emotional wellbeing is indicated by absence of clinically significant psychosocial distress or social isolation.    Personal Goal Other Yes    Personal Goal short term: balance training, know limits to exercise Long term: tuned up to keep up with grandkids, 14 min paced mile    Intervention Will continue to monitor pt and progress workloads as tolerated without sign or symptom    Expected Outcomes Pt will achive her goals and gain strength             Core Components/Risk Factors/Patient Goals Review:    Core Components/Risk Factors/Patient Goals at Discharge (Final Review):    ITP Comments:  ITP Comments     Row Name 04/03/23 0916           ITP Comments Armanda Magic, MD: Medical Director.  Introduction to the [facility] program / Intensive Cardiac Rehab.  Initial orientation packet reviewed with the patient.                Comments:  Participant attended orientation for the cardiac rehabilitation program on  04/03/2023  to perform initial intake and exercise walk test. Patient introduced to the [facility] education and orientation packet was reviewed. Completed 6-minute walk test, measurements, initial ITP, and exercise prescription. Vital signs stable. Telemetry-normal sinus rhythm, Pam did report having mild shortness of breath before proceeding with the walk test. Elita Quick  reported feeling anxious. Completed the walk test without difficulty.Thayer Headings RN BSN   Service time was from [phone] to [phone].

## 2023-04-03 NOTE — Progress Notes (Signed)
Cardiac Rehab Medication Review by a Pharmacist  Does the patient  feel that his/her medications are working for him/her?  no  Has the patient been experiencing any side effects to the medications prescribed?  no  Does the patient measure his/her own blood pressure or blood glucose at home?  no   Does the patient have any problems obtaining medications due to transportation or finances?   no  Understanding of regimen: excellent Understanding of indications: excellent Potential of compliance: excellent    Nurse comments: Pam is taking her medications as prescribed and has a good understanding of what her medications are for. Pam checks her blood pressures daily. Pam gets her medications filled from [facility] and her prescription drug plan.    Arta Bruce [facility] RN 04/03/2023 10:02 AM

## 2023-04-09 ENCOUNTER — Encounter (HOSPITAL_COMMUNITY)
Admission: RE | Admit: 2023-04-09 | Discharge: 2023-04-09 | Disposition: A | Discharge status: Home / Self Care | Payer: Medicare PPO | Source: Ambulatory Visit | Attending: Cardiology | Admitting: Cardiology

## 2023-04-09 DIAGNOSIS — I214 Non-ST elevation (NSTEMI) myocardial infarction: Secondary | ICD-10-CM | POA: Diagnosis not present

## 2023-04-09 DIAGNOSIS — I5181 Takotsubo syndrome: Secondary | ICD-10-CM | POA: Diagnosis not present

## 2023-04-09 LAB — GLUCOSE, CAPILLARY
Glucose-Capillary: 125 mg/dL — ABNORMAL HIGH (ref 70–99)
Glucose-Capillary: 81 mg/dL (ref 70–99)

## 2023-04-09 NOTE — Progress Notes (Signed)
Daily Session Note  Patient Details  Name: Shelby Vazquez MRN: [id number] Date of Birth: [birth year] Referring Provider:   Flowsheet Row INTENSIVE CARDIAC REHAB ORIENT from 04/03/2023 in [facility]  Referring Provider Dr. Bryan Lemma MD       Encounter Date: 04/09/2023  Check In:  Session Check In - 04/09/23 1016       Check-In   Supervising physician immediately available to respond to emergencies [facility] MD immediately available    Physician(s) Bernadene Person, NP    Location [facility]    Staff Present Gladstone Lighter, RN, Sherron Flemings, RN, MSN;Mary Bastin, RN, Marton Redwood, MS, ACSM-CEP, CCRP, Exercise Physiologist;Olinty Peggye Pitt, MS, ACSM-CEP, Exercise Physiologist;Casey Katrinka Blazing, RT    Virtual Visit No    Medication changes reported     No    Fall or balance concerns reported    No    Tobacco Cessation No Change    Warm-up and Cool-down Performed as group-led instruction    Resistance Training Performed No    VAD Patient? No    PAD/SET Patient? No      Pain Assessment   Currently in Pain? No/denies    Pain Score 0-No pain    Multiple Pain Sites No             Capillary Blood Glucose: Results for orders placed or performed during the hospital encounter of 04/09/23 (from the past 24 hour(s))  Glucose, capillary     Status: Abnormal   Collection Time: 04/09/23 10:29 AM  Result Value Ref Range   Glucose-Capillary 125 (H) 70 - 99 mg/dL  Glucose, capillary     Status: None   Collection Time: 04/09/23 11:19 AM  Result Value Ref Range   Glucose-Capillary 81 70 - 99 mg/dL     Exercise Prescription Changes - 04/09/23 1024       Response to Exercise   Blood Pressure (Admit) 126/74    Blood Pressure (Exercise) 142/70    Blood Pressure (Exit) 112/74    Heart Rate (Admit) 79 bpm    Heart Rate (Exercise) 105 bpm    Heart Rate (Exit) 76 bpm    Rating of Perceived Exertion (Exercise) 11     Symptoms None    Comments Off to a good start with exercise.    Duration Continue with 30 min of aerobic exercise without signs/symptoms of physical distress.    Intensity THRR unchanged      Progression   Progression Continue to progress workloads to maintain intensity without signs/symptoms of physical distress.    Average METs 2.7      Resistance Training   Training Prescription No   Relaxation day, no weights     Interval Training   Interval Training No      NuStep   Level 2    SPM 71    Minutes 15    METs 2.2      Track   Laps 18    Minutes 15    METs 3.3             Social History   Tobacco Use  Smoking Status Never  Smokeless Tobacco Never    Goals Met:  Exercise tolerated well No report of concerns or symptoms today  Goals Unmet:  Not Applicable  Comments: Pt started cardiac rehab today.  Pt tolerated light exercise without difficulty. VSS, telemetry-Sinus Rhythm, asymptomatic.  Medication list reconciled. Pt denies barriers to  medicaiton compliance.  PSYCHOSOCIAL ASSESSMENT:  PHQ-15. Pt exhibits Shelby Vazquez says she is dissatisfied with her health due to her recent cardiac event. Shelby Vazquez says she is worried about her husband as he might need surgery this summer. Will review quality of life in the upcoming week. No psychosocial needs identified at this time, no psychosocial interventions necessary.    Pt enjoys traveling and spending time with her grand kids.   Pt oriented to exercise equipment and routine.    Understanding verbalized. Thayer Headings RN BSN    Dr. Armanda Magic is Medical Director for Cardiac Rehab at [facility].

## 2023-04-10 ENCOUNTER — Encounter: Payer: Medicare PPO | Admitting: Pharmacist

## 2023-04-11 ENCOUNTER — Encounter (HOSPITAL_COMMUNITY)
Admission: RE | Admit: 2023-04-11 | Discharge: 2023-04-11 | Disposition: A | Discharge status: Home / Self Care | Payer: Medicare PPO | Source: Ambulatory Visit | Attending: Cardiology | Admitting: Cardiology

## 2023-04-11 DIAGNOSIS — I5181 Takotsubo syndrome: Secondary | ICD-10-CM

## 2023-04-11 DIAGNOSIS — I214 Non-ST elevation (NSTEMI) myocardial infarction: Secondary | ICD-10-CM | POA: Diagnosis not present

## 2023-04-14 ENCOUNTER — Encounter (HOSPITAL_BASED_OUTPATIENT_CLINIC_OR_DEPARTMENT_OTHER): Payer: Self-pay

## 2023-04-14 ENCOUNTER — Encounter (HOSPITAL_COMMUNITY)
Admission: RE | Admit: 2023-04-14 | Discharge: 2023-04-14 | Disposition: A | Discharge status: Home / Self Care | Payer: Medicare PPO | Source: Ambulatory Visit | Attending: Cardiology | Admitting: Cardiology

## 2023-04-14 DIAGNOSIS — I214 Non-ST elevation (NSTEMI) myocardial infarction: Secondary | ICD-10-CM | POA: Diagnosis not present

## 2023-04-14 DIAGNOSIS — I5181 Takotsubo syndrome: Secondary | ICD-10-CM | POA: Diagnosis not present

## 2023-04-15 DIAGNOSIS — N3281 Overactive bladder: Secondary | ICD-10-CM | POA: Diagnosis not present

## 2023-04-15 DIAGNOSIS — Z960 Presence of urogenital implants: Secondary | ICD-10-CM | POA: Diagnosis not present

## 2023-04-15 DIAGNOSIS — R339 Retention of urine, unspecified: Secondary | ICD-10-CM | POA: Diagnosis not present

## 2023-04-15 DIAGNOSIS — R350 Frequency of micturition: Secondary | ICD-10-CM | POA: Diagnosis not present

## 2023-04-15 DIAGNOSIS — R3915 Urgency of urination: Secondary | ICD-10-CM | POA: Diagnosis not present

## 2023-04-16 ENCOUNTER — Encounter (HOSPITAL_COMMUNITY)
Admission: RE | Admit: 2023-04-16 | Discharge: 2023-04-16 | Disposition: A | Discharge status: Home / Self Care | Payer: Medicare PPO | Source: Ambulatory Visit | Attending: Cardiology | Admitting: Cardiology

## 2023-04-16 DIAGNOSIS — I5181 Takotsubo syndrome: Secondary | ICD-10-CM

## 2023-04-16 DIAGNOSIS — I214 Non-ST elevation (NSTEMI) myocardial infarction: Secondary | ICD-10-CM

## 2023-04-18 ENCOUNTER — Encounter (HOSPITAL_COMMUNITY)
Admission: RE | Admit: 2023-04-18 | Discharge: 2023-04-18 | Disposition: A | Discharge status: Home / Self Care | Payer: Medicare PPO | Source: Ambulatory Visit | Attending: Cardiology | Admitting: Cardiology

## 2023-04-18 DIAGNOSIS — I5181 Takotsubo syndrome: Secondary | ICD-10-CM

## 2023-04-18 DIAGNOSIS — I214 Non-ST elevation (NSTEMI) myocardial infarction: Secondary | ICD-10-CM | POA: Diagnosis not present

## 2023-04-21 ENCOUNTER — Encounter (HOSPITAL_COMMUNITY)
Admission: RE | Admit: 2023-04-21 | Discharge: 2023-04-21 | Disposition: A | Discharge status: Home / Self Care | Payer: Medicare PPO | Source: Ambulatory Visit | Attending: Cardiology | Admitting: Cardiology

## 2023-04-21 DIAGNOSIS — I214 Non-ST elevation (NSTEMI) myocardial infarction: Secondary | ICD-10-CM | POA: Diagnosis present

## 2023-04-21 DIAGNOSIS — I5181 Takotsubo syndrome: Secondary | ICD-10-CM | POA: Diagnosis not present

## 2023-04-21 DIAGNOSIS — I252 Old myocardial infarction: Secondary | ICD-10-CM | POA: Diagnosis not present

## 2023-04-21 NOTE — Progress Notes (Signed)
QUALITY OF LIFE SCORE REVIEW  Pt completed Quality of Life survey as a participant in Cardiac Rehab.  Scores 21.0 or below are considered low.  Pt score very low in several areas Overall 15.26, Health and Function 11.20, socioeconomic 27.86, physiological and spiritual 6.86, family 21.60. Patient quality of life slightly altered by physical constraints which limits ability to perform as prior to recent cardiac illness. Shelby Vazquez admits to feeling less depressed since starting cardiac rehab. Shelby Vazquez says her energy level is improving. Shelby Vazquez says she can now do more things around the house and go to the grocery store. Shelby Vazquez is worried about her husband as he is having an  Offered emotional support and reassurance.  Will continue to monitor and intervene as necessary.

## 2023-04-22 NOTE — Progress Notes (Signed)
Cardiac Individual Treatment Plan  Patient Details  Name: Shelby Vazquez MRN: [id number] Date of Birth: April 17, 1951 Referring Provider:   Flowsheet Row INTENSIVE CARDIAC REHAB ORIENT from 04/03/2023 in [facility]  Referring Provider Dr. Bryan Lemma MD       Initial Encounter Date:  Flowsheet Row INTENSIVE CARDIAC REHAB ORIENT from 04/03/2023 in [facility]  Date 04/03/23       Visit Diagnosis: 03/05/23 NSTEMI  03/05/23 Takotsubo cardiomyopathy  Patient's Home Medications on Admission:  Current Outpatient Medications:    acetaminophen (TYLENOL) 500 MG tablet, Take 500 mg by mouth every 6 (six) hours as needed., Disp: , Rfl:    aspirin 81 MG tablet, Take 1 tablet (81 mg total) by mouth daily., Disp: 30 tablet, Rfl:    Calcium Carbonate-Vitamin D 600-200 MG-UNIT CAPS, Take 1 capsule by mouth daily., Disp: , Rfl:    clidinium-chlordiazePOXIDE (LIBRAX) 5-2.5 MG capsule, Take 1 capsule by mouth 2 (two) times daily at 8 am and 10 pm., Disp: , Rfl:    Estradiol-Estriol-Progesterone (BIEST/PROGESTERONE TD), Place 2 mLs onto the skin in the morning and at bedtime. Apply to inner thighs or arms, Disp: , Rfl:    famotidine (PEPCID) 40 MG tablet, TAKE 1 TABLET(40 MG) BY MOUTH AT BEDTIME (Patient taking differently: Take 40 mg by mouth at bedtime.), Disp: 90 tablet, Rfl: 2   loratadine (CLARITIN) 10 MG tablet, Take 10 mg by mouth daily., Disp: , Rfl:    methenamine (HIPREX) 1 g tablet, Take 1 tablet (1 g total) by mouth 2 (two) times daily., Disp: 180 tablet, Rfl: 3   metoprolol succinate (TOPROL-XL) 100 MG 24 hr tablet, Take 1 tablet (100 mg total) by mouth daily. Take with or immediately following a meal., Disp: 90 tablet, Rfl: 3   ondansetron (ZOFRAN-ODT) 4 MG disintegrating tablet, DISSOLVE 1 TABLET(4 MG) ON THE TONGUE EVERY 8 HOURS AS NEEDED FOR NAUSEA OR VOMITING, Disp: 30  tablet, Rfl: 00   Probiotic Product (PROBIOTIC PO), Take by mouth daily. 900 billion, Disp: , Rfl:    RABEprazole (ACIPHEX) 20 MG tablet, Take 20 mg by mouth daily., Disp: , Rfl:    rosuvastatin (CRESTOR) 20 MG tablet, Take 1 tablet (20 mg total) by mouth every evening., Disp: 90 tablet, Rfl: 3   sacubitril-valsartan (ENTRESTO) 24-26 MG, Take 1 tablet by mouth 2 (two) times daily., Disp: 180 tablet, Rfl: 3   spironolactone (ALDACTONE) 25 MG tablet, Take 0.5 tablets (12.5 mg total) by mouth daily., Disp: 45 tablet, Rfl: 3   Vitamin B Complex-C CAPS, Take 1 capsule by mouth daily., Disp: , Rfl:   Past Medical History: Past Medical History:  Diagnosis Date   Abdominal aortic atherosclerosis (HCC) 10/2014   by CT scan   B12 deficiency    Chronic anxiety    Chronic cough    cyclical cough syndrome   Complication of anesthesia 9 yrs ago   awake and in severe pain with colonoscopy   Concussion    Diet-controlled diabetes mellitus (HCC) 02/22/2017   New dx 02/2017   GERD (gastroesophageal reflux disease)    severe leading to chronic cough and hoarseness   History of Epstein-Barr virus infection 2012   Hyperlipidemia    Hypertension    IBS (irritable bowel syndrome)    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, PB-8 probiotic, healthy diet and exercise   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  By CT 09/2017 & 09/2018 - max diameter 4cm f/u annually    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   had surgery with 5 days of chemotherapy   Osteopenia 01/2015   T -0.8 spine, -1.7 hip   Pyelonephritis due to Escherichia coli 07/16/2017   Uterine prolapse    Vocal cord paralysis, bilateral partial 2001   evaluated at [facility] - did not require surgery    Tobacco Use: Social History   Tobacco Use  Smoking Status Never  Smokeless Tobacco Never    Labs: Review Flowsheet  More data exists      Latest Ref Rng & Units 04/17/2021 03/19/2022 07/03/2022 11/08/2022 03/06/2023  Labs for ITP  Cardiac and Pulmonary Rehab  Cholestrol 0 - 200 mg/dL 867  - 619  - 509   LDL (calc) 0 - 99 mg/dL 64  - 53  - 71   Direct LDL 0 - 99 mg/dL - 69  - - -  HDL-C >32 mg/dL 67.12  - 45.80  - 37   Trlycerides <150 mg/dL 998.3  - 382.5  - 88   Hemoglobin A1c 4.8 - 5.6 % 6.6  - 6.4  6.1  6.2     Capillary Blood Glucose: Lab Results  Component Value Date   GLUCAP 81 04/09/2023   GLUCAP 125 (H) 04/09/2023   GLUCAP 136 (H) 07/20/2017   GLUCAP 116 (H) 07/20/2017   GLUCAP 108 (H) 07/19/2017     Exercise Target Goals: Exercise Program Goal: Individual exercise prescription set using results from initial 6 min walk test and THRR while considering  patient's activity barriers and safety.   Exercise Prescription Goal: Initial exercise prescription builds to 30-45 minutes a day of aerobic activity, 2-3 days per week.  Home exercise guidelines will be given to patient during program as part of exercise prescription that the participant will acknowledge.  Activity Barriers & Risk Stratification:  Activity Barriers & Cardiac Risk Stratification - 04/03/23 1205       Activity Barriers & Cardiac Risk Stratification   Activity Barriers Deconditioning;Balance Concerns;Shortness of Breath    Cardiac Risk Stratification High             6 Minute Walk:  6 Minute Walk     Row Name 04/03/23 1146         6 Minute Walk   Phase Initial     Distance 1731 feet     Walk Time 6 minutes     # of Rest Breaks 0     MPH 3.28     METS 3.69     RPE 11     Perceived Dyspnea  1     VO2 Peak 12.91     Symptoms Yes (comment)     Comments Pre walk test patient SOBx1 SaO2 98% possible anxiety. Nurse aware, lung sounds clear. Resolved with rest. Patient felt good on the walk test, SOB x1 post, resolved with rest     Resting HR 64 bpm     Resting BP 112/70     Resting Oxygen Saturation  98 %     Exercise Oxygen Saturation  during 6 min walk 98 %     Max Ex. HR 97 bpm     Max Ex. BP 156/62     2 Minute  Post BP 134/68              Oxygen Initial Assessment:   Oxygen Re-Evaluation:   Oxygen Discharge (Final Oxygen Re-Evaluation):  Initial Exercise Prescription:  Initial Exercise Prescription - 04/03/23 1200       Date of Initial Exercise RX and Referring Provider   Date 04/03/23    Referring Provider Dr. Bryan Lemma MD    Expected Discharge Date 06/13/23      NuStep   Level 2    SPM 80    Minutes 15    METs 2.2      Track   Laps 22    Minutes 15    METs 2.9      Prescription Details   Frequency (times per week) 3    Duration Progress to 30 minutes of continuous aerobic without signs/symptoms of physical distress      Intensity   THRR 40-80% of Max Heartrate 59-118    Ratings of Perceived Exertion 11-13    Perceived Dyspnea 0-4      Progression   Progression Continue progressive overload as per policy without signs/symptoms or physical distress.      Resistance Training   Training Prescription Yes    Weight 2    Reps 10-15             Perform Capillary Blood Glucose checks as needed.  Exercise Prescription Changes:   Exercise Prescription Changes     Row Name 04/09/23 1024 04/21/23 1024           Response to Exercise   Blood Pressure (Admit) 126/74 128/72      Blood Pressure (Exercise) 142/70 120/68      Blood Pressure (Exit) 112/74 112/72      Heart Rate (Admit) 79 bpm 89 bpm      Heart Rate (Exercise) 105 bpm 106 bpm      Heart Rate (Exit) 76 bpm 75 bpm      Rating of Perceived Exertion (Exercise) 11 11      Symptoms None None      Comments Off to a good start with exercise. Reviewed METs      Duration Continue with 30 min of aerobic exercise without signs/symptoms of physical distress. Continue with 30 min of aerobic exercise without signs/symptoms of physical distress.      Intensity THRR unchanged THRR unchanged        Progression   Progression Continue to progress workloads to maintain intensity without signs/symptoms of  physical distress. Continue to progress workloads to maintain intensity without signs/symptoms of physical distress.      Average METs 2.7 3.2        Resistance Training   Training Prescription No  Relaxation day, no weights No  Relaxation day, no weights        Interval Training   Interval Training No No        NuStep   Level 2 2      SPM 71 102      Minutes 15 15      METs 2.2 2.8        Track   Laps 18 21      Minutes 15 15      METs 3.3 3.68               Exercise Comments:   Exercise Comments     Row Name 04/09/23 1129 04/21/23 1111         Exercise Comments Pam tolerated 1st session of exercise well without symptoms. Oriented to warm-up/cool-down stretching routine. Reviewed METs with patient.  Exercise Goals and Review:   Exercise Goals     Row Name 04/03/23 1208             Exercise Goals   Increase Physical Activity Yes       Intervention Provide advice, education, support and counseling about physical activity/exercise needs.;Develop an individualized exercise prescription for aerobic and resistive training based on initial evaluation findings, risk stratification, comorbidities and participant's personal goals.       Expected Outcomes Short Term: Attend rehab on a regular basis to increase amount of physical activity.;Long Term: Exercising regularly at least 3-5 days a week.;Long Term: Add in home exercise to make exercise part of routine and to increase amount of physical activity.       Increase Strength and Stamina Yes       Intervention Provide advice, education, support and counseling about physical activity/exercise needs.;Develop an individualized exercise prescription for aerobic and resistive training based on initial evaluation findings, risk stratification, comorbidities and participant's personal goals.       Expected Outcomes Short Term: Increase workloads from initial exercise prescription for resistance, speed, and  METs.;Short Term: Perform resistance training exercises routinely during rehab and add in resistance training at home;Long Term: Improve cardiorespiratory fitness, muscular endurance and strength as measured by increased METs and functional capacity ( )       Able to understand and use rate of perceived exertion (RPE) scale Yes       Intervention Provide education and explanation on how to use RPE scale       Expected Outcomes Short Term: Able to use RPE daily in rehab to express subjective intensity level;Long Term:  Able to use RPE to guide intensity level when exercising independently       Knowledge and understanding of Target Heart Rate Range (THRR) Yes       Intervention Provide education and explanation of THRR including how the numbers were predicted and where they are located for reference       Expected Outcomes Short Term: Able to state/look up THRR;Long Term: Able to use THRR to govern intensity when exercising independently;Short Term: Able to use daily as guideline for intensity in rehab       Understanding of Exercise Prescription Yes       Intervention Provide education, explanation, and written materials on patient's individual exercise prescription       Expected Outcomes Short Term: Able to explain program exercise prescription;Long Term: Able to explain home exercise prescription to exercise independently                Exercise Goals Re-Evaluation :  Exercise Goals Re-Evaluation     Row Name 04/09/23 1129             Exercise Goal Re-Evaluation   Exercise Goals Review Increase Physical Activity;Increase Strength and Stamina;Able to understand and use rate of perceived exertion (RPE) scale       Comments Pam was able to understand and use RPE scale appropriately.       Expected Outcomes Progress workloads as tolerated to help achieve personal health and fitness goals.                Discharge Exercise Prescription (Final Exercise Prescription Changes):   Exercise Prescription Changes - 04/21/23 1024       Response to Exercise   Blood Pressure (Admit) 128/72    Blood Pressure (Exercise) 120/68    Blood Pressure (Exit) 112/72    Heart Rate (Admit) 89  bpm    Heart Rate (Exercise) 106 bpm    Heart Rate (Exit) 75 bpm    Rating of Perceived Exertion (Exercise) 11    Symptoms None    Comments Reviewed METs    Duration Continue with 30 min of aerobic exercise without signs/symptoms of physical distress.    Intensity THRR unchanged      Progression   Progression Continue to progress workloads to maintain intensity without signs/symptoms of physical distress.    Average METs 3.2      Resistance Training   Training Prescription No   Relaxation day, no weights     Interval Training   Interval Training No      NuStep   Level 2    SPM 102    Minutes 15    METs 2.8      Track   Laps 21    Minutes 15    METs 3.68             Nutrition:  Target Goals: Understanding of nutrition guidelines, daily intake of sodium 1500mg , cholesterol 200mg , calories 30% from fat and 7% or less from saturated fats, daily to have 5 or more servings of fruits and vegetables.  Biometrics:  Pre Biometrics - 04/03/23 1144       Pre Biometrics   Waist Circumference 37 inches    Hip Circumference 40.5 inches    Waist to Hip Ratio 0.91 %    Triceps Skinfold 19 mm    % Body Fat 37.3 %    Grip Strength 24 kg    Flexibility 12.25 in    Single Leg Stand 4.5 seconds              Nutrition Therapy Plan and Nutrition Goals:  Nutrition Therapy & Goals - 04/11/23 1130       Nutrition Therapy   Diet Heart Healthy Diet    Drug/Food Interactions Statins/Certain Fruits      Personal Nutrition Goals   Nutrition Goal Patient to improve diet quality by using the plate method as a guide for meal planning to include lean protein/plant protein, fruits, vegetables, whole grains, nonfat dairy as part of a well-balanced diet.    Personal Goal #2 Patient  to identify strategies for reducing cardiovascular risk by attending the [employer] education and nutrition series weekly.    Personal Goal #3 Patient to reduce sodium to 1500mg  per day    Comments Pam reports making many changes including reduced sodium intake and decreased saturated fat/meat intake. Patient will benefit from participation in intensive cardiac rehab for nutrition, exercise, and lifestyle modification.      Intervention Plan   Intervention Prescribe, educate and counsel regarding individualized specific dietary modifications aiming towards targeted core components such as weight, hypertension, lipid management, diabetes, heart failure and other comorbidities.;Nutrition handout(s) given to patient.    Expected Outcomes Short Term Goal: Understand basic principles of dietary content, such as calories, fat, sodium, cholesterol and nutrients.;Long Term Goal: Adherence to prescribed nutrition plan.             Nutrition Assessments:  Nutrition Assessments - 04/14/23 1006       Rate Your Plate Scores   Pre Score 58            MEDIFICTS Score Key: ?70 Need to make dietary changes  40-70 Heart Healthy Diet ? 40 Therapeutic Level Cholesterol Diet   Flowsheet Row INTENSIVE CARDIAC REHAB from 04/11/2023 in [facility]  Picture Your Plate Total Score on Admission 58      Picture Your Plate Scores: <91 Unhealthy dietary pattern with much room for improvement. 41-50 Dietary pattern unlikely to meet recommendations for good health and room for improvement. 51-60 More healthful dietary pattern, with some room for improvement.  >60 Healthy dietary pattern, although there may be some specific behaviors that could be improved.    Nutrition Goals Re-Evaluation:  Nutrition Goals Re-Evaluation     Row Name 04/11/23 1130             Goals   Current Weight 154 lb 12.2 oz (70.2 kg)       Comment A1c 6.2, lipids  WNL-HDL 37, TSH 4.87, lipoprotein A WNL       Expected Outcome Pam reports making many changes including reduced sodium intake and decreased saturated fat/meat intake. Patient will benefit from participation in intensive cardiac rehab for nutrition, exercise, and lifestyle modification.                Nutrition Goals Re-Evaluation:  Nutrition Goals Re-Evaluation     Row Name 04/11/23 1130             Goals   Current Weight 154 lb 12.2 oz (70.2 kg)       Comment A1c 6.2, lipids WNL-HDL 37, TSH 4.87, lipoprotein A WNL       Expected Outcome Pam reports making many changes including reduced sodium intake and decreased saturated fat/meat intake. Patient will benefit from participation in intensive cardiac rehab for nutrition, exercise, and lifestyle modification.                Nutrition Goals Discharge (Final Nutrition Goals Re-Evaluation):  Nutrition Goals Re-Evaluation - 04/11/23 1130       Goals   Current Weight 154 lb 12.2 oz (70.2 kg)    Comment A1c 6.2, lipids WNL-HDL 37, TSH 4.87, lipoprotein A WNL    Expected Outcome Pam reports making many changes including reduced sodium intake and decreased saturated fat/meat intake. Patient will benefit from participation in intensive cardiac rehab for nutrition, exercise, and lifestyle modification.             Psychosocial: Target Goals: Acknowledge presence or absence of significant depression and/or stress, maximize coping skills, provide positive support system. Participant is able to verbalize types and ability to use techniques and skills needed for reducing stress and depression.  Initial Review & Psychosocial Screening:  Initial Psych Review & Screening - 04/03/23 1101       Initial Review   Current issues with Current Depression;Current Stress Concerns    Source of Stress Concerns Chronic Illness;Family    Comments Pam says she is currently depressed  and angry due to her recent cardiac event and diagnosis. Pam  is also concerened about her husband as he may need colon surgery this summer. Pam is not currently taking an antidepressant and is not interested counselling. i offered to get an appointment with Pam's PCP Dr Renee Ramus she declines at this time and says that he is aware of her suituation. Pam said her sister commited suicide 23 years ago she would never take her own life as she know it would cause alot of pain and hardship for her family.      Family Dynamics   Good Support System? Yes   Pam has her husband, children and grandchildren for support     Barriers   Psychosocial barriers to participate in program The patient  should benefit from training in stress management and relaxation.      Screening Interventions   Interventions Encouraged to exercise;Provide feedback about the scores to participant;To provide support and resources with identified psychosocial needs    Expected Outcomes Long Term Goal: Stressors or current issues are controlled or eliminated.;Short Term goal: Identification and review with participant of any Quality of Life or Depression concerns found by scoring the questionnaire.;Long Term goal: The participant improves quality of Life and PHQ9 Scores as seen by post scores and/or verbalization of changes             Quality of Life Scores:  Quality of Life - 04/03/23 1209       Quality of Life   Select Quality of Life      Quality of Life Scores   Health/Function Pre 11.2 %    Socioeconomic Pre 27.86 %    Psych/Spiritual Pre 6.86 %    Family Pre 21.6 %    GLOBAL Pre 15.26 %            Scores of 19 and below usually indicate a poorer quality of life in these areas.  A difference of  2-3 points is a clinically meaningful difference.  A difference of 2-3 points in the total score of the Quality of Life Index has been associated with significant improvement in overall quality of life, self-image, physical symptoms, and general health in studies assessing change  in quality of life.  PHQ-9: Review Flowsheet  More data exists      04/03/2023 11/08/2022 04/17/2022 02/20/2022 12/24/2021  Depression screen PHQ 2/9  Decreased Interest 0 0 1 0 3 1  Down, Depressed, Hopeless 3 0 1 3 1   PHQ - 2 Score 3 0 2 0 6 2  Altered sleeping 1 0 3 3 1   Tired, decreased energy 3 3 2 3 2   Change in appetite 1 0 0 3 2  Feeling bad or failure about yourself  3 0 1 3 2   Trouble concentrating 3 1 1 3 2   Moving slowly or fidgety/restless 1 0 0 3 0  Suicidal thoughts 0 0 0 2 0  PHQ-9 Score 15 4 9 26 11   Difficult doing work/chores Somewhat difficult Somewhat difficult Somewhat difficult Somewhat difficult -   Interpretation of Total Score  Total Score Depression Severity:  1-4 = Minimal depression, 5-9 = Mild depression, 10-14 = Moderate depression, 15-19 = Moderately severe depression, 20-27 = Severe depression   Psychosocial Evaluation and Intervention:   Psychosocial Re-Evaluation:  Psychosocial Re-Evaluation     Row Name 04/10/23 0751 04/22/23 1423           Psychosocial Re-Evaluation   Current issues with Current Depression;Current Stress Concerns Current Depression;Current Stress Concerns      Comments Pam started intensive cardiac rehab on 04/09/23. Pam said she felt less depressed on her first day of exercise as she was feeling apprehensive about starting intensive cardiac rehab. Will review quality of life and PHQ-9 in the upcoming week Quality of life questionnaire reviewed with patient and was forwarded to Pam's PCP, Dr Sharen Hones. Pam said she felt less depressed since she has been participating in cardiac rehab      Interventions Encouraged to attend Cardiac Rehabilitation for the exercise;Stress management education;Relaxation education Encouraged to attend Cardiac Rehabilitation for the exercise;Stress management education;Relaxation education      Continue Psychosocial Services  Follow up required by staff Follow up required by staff  Initial  Review   Source of Stress Concerns Chronic Illness;Family Chronic Illness;Family      Comments Will continue to monitor and offer support as needed Will continue to monitor and offer support as needed               Psychosocial Discharge (Final Psychosocial Re-Evaluation):  Psychosocial Re-Evaluation - 04/22/23 1423       Psychosocial Re-Evaluation   Current issues with Current Depression;Current Stress Concerns    Comments Quality of life questionnaire reviewed with patient and was forwarded to Pam's PCP, Dr Sharen Hones. Pam said she felt less depressed since she has been participating in cardiac rehab    Interventions Encouraged to attend Cardiac Rehabilitation for the exercise;Stress management education;Relaxation education    Continue Psychosocial Services  Follow up required by staff      Initial Review   Source of Stress Concerns Chronic Illness;Family    Comments Will continue to monitor and offer support as needed             Vocational Rehabilitation: Provide vocational rehab assistance to qualifying candidates.   Vocational Rehab Evaluation & Intervention:  Vocational Rehab - 04/03/23 1107       Initial Vocational Rehab Evaluation & Intervention   Assessment shows need for Vocational Rehabilitation No   Pam is retired and does not need vocational rehab at this time            Education: Education Goals: Education classes will be provided on a weekly basis, covering required topics. Participant will state understanding/return demonstration of topics presented.    Education     Row Name 04/09/23 1500     Education   Cardiac Education Topics [employer]   Orthoptist   Educator Dietitian   Weekly Topic [employer] Desserts   Instruction Review Code 1- Verbalizes Understanding   Class Start Time 1145   Class Stop Time 1235   Class Time Calculation (min) 50 min    Row Name 04/11/23 1300     Education   Cardiac Education  Topics [employer]   Licensed conveyancer Nutrition   Nutrition Other  label reading   Instruction Review Code 1- Verbalizes Understanding   Class Start Time 1147   Class Stop Time 1226   Class Time Calculation (min) 39 min    Row Name 04/14/23 1300     Education   Cardiac Education Topics [employer]   [employer]     Workshops   Educator Exercise Physiologist   Select Exercise   Exercise Workshop Exercise Basics: Diplomatic Services operational officer   Instruction Review Code 1- Verbalizes Understanding   Class Start Time 1145   Class Stop Time 1230   Class Time Calculation (min) 45 min    Row Name 04/16/23 1300     Education   Cardiac Education Topics [employer]   Orthoptist   Educator Dietitian   Weekly Topic Tasty Appetizers and Snacks   Instruction Review Code 1- Verbalizes Understanding   Class Start Time 1143   Class Stop Time 1215   Class Time Calculation (min) 32 min    Row Name 04/18/23 1400     Education   Cardiac Education Topics [employer]   Nurse, children's Exercise Physiologist   Select Nutrition   Nutrition Nutrition  Action Plan   Instruction Review Code 1- Verbalizes Understanding   Class Start Time 1145   Class Stop Time 1218   Class Time Calculation (min) 33 min    Row Name 04/21/23 1200     Education   Cardiac Education Topics Pritikin   Actor Dietitian   Select Nutrition   Nutrition Calorie Density   Instruction Review Code 1- Verbalizes Understanding   Class Start Time 1145   Class Stop Time 1223   Class Time Calculation (min) 38 min            Core Videos: Exercise    Move It!  Clinical staff conducted group or individual video education with verbal and written material and guidebook.  Patient learns the recommended Pritikin exercise program. Exercise with the goal of living a long,  healthy life. Some of the health benefits of exercise include controlled diabetes, healthier blood pressure levels, improved cholesterol levels, improved heart and lung capacity, improved sleep, and better body composition. Everyone should speak with their doctor before starting or changing an exercise routine.  Biomechanical Limitations Clinical staff conducted group or individual video education with verbal and written material and guidebook.  Patient learns how biomechanical limitations can impact exercise and how we can mitigate and possibly overcome limitations to have an impactful and balanced exercise routine.  Body Composition Clinical staff conducted group or individual video education with verbal and written material and guidebook.  Patient learns that body composition (ratio of muscle mass to fat mass) is a key component to assessing overall fitness, rather than body weight alone. Increased fat mass, especially visceral belly fat, can put [place] at increased risk for metabolic syndrome, type 2 diabetes, heart disease, and even death. It is recommended to combine diet and exercise (cardiovascular and resistance training) to improve your body composition. Seek guidance from your physician and exercise physiologist before implementing an exercise routine.  Exercise Action Plan Clinical staff conducted group or individual video education with verbal and written material and guidebook.  Patient learns the recommended strategies to achieve and enjoy long-term exercise adherence, including variety, self-motivation, self-efficacy, and positive decision making. Benefits of exercise include fitness, good health, weight management, more energy, better sleep, less stress, and overall well-being.  Medical   Heart Disease Risk Reduction Clinical staff conducted group or individual video education with verbal and written material and guidebook.  Patient learns our heart is our most vital organ as it  circulates oxygen, nutrients, white blood cells, and hormones throughout the entire body, and carries waste away. Data supports a plant-based eating plan like the Pritikin Program for its effectiveness in slowing progression of and reversing heart disease. The video provides a number of recommendations to address heart disease.   Metabolic Syndrome and Belly Fat  Clinical staff conducted group or individual video education with verbal and written material and guidebook.  Patient learns what metabolic syndrome is, how it leads to heart disease, and how one can reverse it and keep it from coming back. You have metabolic syndrome if you have 3 of the following 5 criteria: abdominal obesity, high blood pressure, high triglycerides, low HDL cholesterol, and high blood sugar.  Hypertension and Heart Disease Clinical staff conducted group or individual video education with verbal and written material and guidebook.  Patient learns that high blood pressure, or hypertension, is very common in the [place]. Hypertension is largely due to excessive salt intake, but other  important risk factors include being overweight, physical inactivity, drinking too much alcohol, smoking, and not eating enough potassium from fruits and vegetables. High blood pressure is a leading risk factor for heart attack, stroke, congestive heart failure, dementia, kidney failure, and premature death. Long-term effects of excessive salt intake include stiffening of the arteries and thickening of heart muscle and organ damage. Recommendations include ways to reduce hypertension and the risk of heart disease.  Diseases of Our Time - Focusing on Diabetes Clinical staff conducted group or individual video education with verbal and written material and guidebook.  Patient learns why the best way to stop diseases of our time is prevention, through food and other lifestyle changes. Medicine (such as prescription pills and surgeries) is often  only a Band-Aid on the problem, not a long-term solution. Most common diseases of our time include obesity, type 2 diabetes, hypertension, heart disease, and cancer. The Pritikin Program is recommended and has been proven to help reduce, reverse, and/or prevent the damaging effects of metabolic syndrome.  Nutrition   Overview of the Pritikin Eating Plan  Clinical staff conducted group or individual video education with verbal and written material and guidebook.  Patient learns about the Pritikin Eating Plan for disease risk reduction. The Pritikin Eating Plan emphasizes a wide variety of unrefined, minimally-processed carbohydrates, like fruits, vegetables, whole grains, and legumes. Go, Caution, and Stop food choices are explained. Plant-based and lean animal proteins are emphasized. Rationale provided for low sodium intake for blood pressure control, low added sugars for blood sugar stabilization, and low added fats and oils for coronary artery disease risk reduction and weight management.  Calorie Density  Clinical staff conducted group or individual video education with verbal and written material and guidebook.  Patient learns about calorie density and how it impacts the Pritikin Eating Plan. Knowing the characteristics of the food you choose will help you decide whether those foods will lead to weight gain or weight loss, and whether you want to consume more or less of them. Weight loss is usually a side effect of the Pritikin Eating Plan because of its focus on low calorie-dense foods.  Label Reading  Clinical staff conducted group or individual video education with verbal and written material and guidebook.  Patient learns about the Pritikin recommended label reading guidelines and corresponding recommendations regarding calorie density, added sugars, sodium content, and whole grains.  Dining Out - Part 1  Clinical staff conducted group or individual video education with verbal and written  material and guidebook.  Patient learns that restaurant meals can be sabotaging because they can be so high in calories, fat, sodium, and/or sugar. Patient learns recommended strategies on how to positively address this and avoid unhealthy pitfalls.  Facts on Fats  Clinical staff conducted group or individual video education with verbal and written material and guidebook.  Patient learns that lifestyle modifications can be just as effective, if not more so, as many medications for lowering your risk of heart disease. A Pritikin lifestyle can help to reduce your risk of inflammation and atherosclerosis (cholesterol build-up, or plaque, in the artery walls). Lifestyle interventions such as dietary choices and physical activity address the cause of atherosclerosis. A review of the types of fats and their impact on blood cholesterol levels, along with dietary recommendations to reduce fat intake is also included.  Nutrition Action Plan  Clinical staff conducted group or individual video education with verbal and written material and guidebook.  Patient learns how to incorporate Pritikin recommendations  into their lifestyle. Recommendations include planning and keeping personal health goals in mind as an important part of their success.  Healthy Mind-Set    Healthy Minds, Bodies, Hearts  Clinical staff conducted group or individual video education with verbal and written material and guidebook.  Patient learns how to identify when they are stressed. Video will discuss the impact of that stress, as well as the many benefits of stress management. Patient will also be introduced to stress management techniques. The way we think, act, and feel has an impact on our hearts.  How Our Thoughts Can Heal Our Hearts  Clinical staff conducted group or individual video education with verbal and written material and guidebook.  Patient learns that negative thoughts can cause depression and anxiety. This can result in  negative lifestyle behavior and serious health problems. Cognitive behavioral therapy is an effective method to help control our thoughts in order to change and improve our emotional outlook.  Additional Videos:  Exercise    Improving Performance  Clinical staff conducted group or individual video education with verbal and written material and guidebook.  Patient learns to use a non-linear approach by alternating intensity levels and lengths of time spent exercising to help burn more calories and lose more body fat. Cardiovascular exercise helps improve heart health, metabolism, hormonal balance, blood sugar control, and recovery from fatigue. Resistance training improves strength, endurance, balance, coordination, reaction time, metabolism, and muscle mass. Flexibility exercise improves circulation, posture, and balance. Seek guidance from your physician and exercise physiologist before implementing an exercise routine and learn your capabilities and proper form for all exercise.  Introduction to Yoga  Clinical staff conducted group or individual video education with verbal and written material and guidebook.  Patient learns about yoga, a discipline of the coming together of mind, breath, and body. The benefits of yoga include improved flexibility, improved range of motion, better posture and core strength, increased lung function, weight loss, and positive self-image. Yoga's heart health benefits include lowered blood pressure, healthier heart rate, decreased cholesterol and triglyceride levels, improved immune function, and reduced stress. Seek guidance from your physician and exercise physiologist before implementing an exercise routine and learn your capabilities and proper form for all exercise.  Medical   Aging: Enhancing Your Quality of Life  Clinical staff conducted group or individual video education with verbal and written material and guidebook.  Patient learns key strategies and  recommendations to stay in good physical health and enhance quality of life, such as prevention strategies, having an advocate, securing a Health Care Proxy and Power of Attorney, and keeping a list of medications and system for tracking them. It also discusses how to avoid risk for bone loss.  Biology of Weight Control  Clinical staff conducted group or individual video education with verbal and written material and guidebook.  Patient learns that weight gain occurs because we consume more calories than we burn (eating more, moving less). Even if your body weight is normal, you may have higher ratios of fat compared to muscle mass. Too much body fat puts you at increased risk for cardiovascular disease, heart attack, stroke, type 2 diabetes, and obesity-related cancers. In addition to exercise, following the Pritikin Eating Plan can help reduce your risk.  Decoding Lab Results  Clinical staff conducted group or individual video education with verbal and written material and guidebook.  Patient learns that lab test reflects one measurement whose values change over time and are influenced by many factors, including medication, stress, sleep,  exercise, food, hydration, pre-existing medical conditions, and more. It is recommended to use the knowledge from this video to become more involved with your lab results and evaluate your numbers to speak with your doctor.   Diseases of Our Time - Overview  Clinical staff conducted group or individual video education with verbal and written material and guidebook.  Patient learns that according to the CDC, 50% to 70% of chronic diseases (such as obesity, type 2 diabetes, elevated lipids, hypertension, and heart disease) are avoidable through lifestyle improvements including healthier food choices, listening to satiety cues, and increased physical activity.  Sleep Disorders Clinical staff conducted group or individual video education with verbal and written  material and guidebook.  Patient learns how good quality and duration of sleep are important to overall health and well-being. Patient also learns about sleep disorders and how they impact health along with recommendations to address them, including discussing with a physician.  Nutrition  Dining Out - Part 2 Clinical staff conducted group or individual video education with verbal and written material and guidebook.  Patient learns how to plan ahead and communicate in order to maximize their dining experience in a healthy and nutritious manner. Included are recommended food choices based on the type of restaurant the patient is visiting.   Fueling a Banker conducted group or individual video education with verbal and written material and guidebook.  There is a strong connection between our food choices and our health. Diseases like obesity and type 2 diabetes are very prevalent and are in large-part due to lifestyle choices. The Pritikin Eating Plan provides plenty of food and hunger-curbing satisfaction. It is easy to follow, affordable, and helps reduce health risks.  Menu Workshop  Clinical staff conducted group or individual video education with verbal and written material and guidebook.  Patient learns that restaurant meals can sabotage health goals because they are often packed with calories, fat, sodium, and sugar. Recommendations include strategies to plan ahead and to communicate with the manager, chef, or server to help order a healthier meal.  Planning Your Eating Strategy  Clinical staff conducted group or individual video education with verbal and written material and guidebook.  Patient learns about the Pritikin Eating Plan and its benefit of reducing the risk of disease. The Pritikin Eating Plan does not focus on calories. Instead, it emphasizes high-quality, nutrient-rich foods. By knowing the characteristics of the foods, we choose, we can determine their  calorie density and make informed decisions.  Targeting Your Nutrition Priorities  Clinical staff conducted group or individual video education with verbal and written material and guidebook.  Patient learns that lifestyle habits have a tremendous impact on disease risk and progression. This video provides eating and physical activity recommendations based on your personal health goals, such as reducing LDL cholesterol, losing weight, preventing or controlling type 2 diabetes, and reducing high blood pressure.  Vitamins and Minerals  Clinical staff conducted group or individual video education with verbal and written material and guidebook.  Patient learns different ways to obtain key vitamins and minerals, including through a recommended healthy diet. It is important to discuss all supplements you take with your doctor.   Healthy Mind-Set    Smoking Cessation  Clinical staff conducted group or individual video education with verbal and written material and guidebook.  Patient learns that cigarette smoking and tobacco addiction pose a serious health risk which affects millions of people. Stopping smoking will significantly reduce the risk of heart disease,  lung disease, and many forms of cancer. Recommended strategies for quitting are covered, including working with your doctor to develop a successful plan.  Culinary   Becoming a Set designer conducted group or individual video education with verbal and written material and guidebook.  Patient learns that cooking at home can be healthy, cost-effective, quick, and puts them in control. Keys to cooking healthy recipes will include looking at your recipe, assessing your equipment needs, planning ahead, making it simple, choosing cost-effective seasonal ingredients, and limiting the use of added fats, salts, and sugars.  Cooking - Breakfast and Snacks  Clinical staff conducted group or individual video education with verbal and  written material and guidebook.  Patient learns how important breakfast is to satiety and nutrition through the entire day. Recommendations include key foods to eat during breakfast to help stabilize blood sugar levels and to prevent overeating at meals later in the day. Planning ahead is also a key component.  Cooking - Educational psychologist conducted group or individual video education with verbal and written material and guidebook.  Patient learns eating strategies to improve overall health, including an approach to cook more at home. Recommendations include thinking of animal protein as a side on your plate rather than center stage and focusing instead on lower calorie dense options like vegetables, fruits, whole grains, and plant-based proteins, such as beans. Making sauces in large quantities to freeze for later and leaving the skin on your vegetables are also recommended to maximize your experience.  Cooking - Healthy Salads and Dressing Clinical staff conducted group or individual video education with verbal and written material and guidebook.  Patient learns that vegetables, fruits, whole grains, and legumes are the foundations of the Pritikin Eating Plan. Recommendations include how to incorporate each of these in flavorful and healthy salads, and how to create homemade salad dressings. Proper handling of ingredients is also covered. Cooking - Soups and [employer] - Soups and Desserts Clinical staff conducted group or individual video education with verbal and written material and guidebook.  Patient learns that Pritikin soups and desserts make for easy, nutritious, and delicious snacks and meal components that are low in sodium, fat, sugar, and calorie density, while high in vitamins, minerals, and filling fiber. Recommendations include simple and healthy ideas for soups and desserts.   Overview     The Pritikin Solution Program Overview Clinical staff conducted group or  individual video education with verbal and written material and guidebook.  Patient learns that the results of the Pritikin Program have been documented in more than 100 articles published in peer-reviewed journals, and the benefits include reducing risk factors for (and, in some cases, even reversing) high cholesterol, high blood pressure, type 2 diabetes, obesity, and more! An overview of the three key pillars of the Pritikin Program will be covered: eating well, doing regular exercise, and having a healthy mind-set.  WORKSHOPS  Exercise: Exercise Basics: Building Your Action Plan Clinical staff led group instruction and group discussion with PowerPoint presentation and patient guidebook. To enhance the learning environment the use of posters, models and videos may be added. At the conclusion of this workshop, patients will comprehend the difference between physical activity and exercise, as well as the benefits of incorporating both, into their routine. Patients will understand the FITT (Frequency, Intensity, Time, and Type) principle and how to use it to build an exercise action plan. In addition, safety concerns and other considerations for exercise and  cardiac rehab will be addressed by the presenter. The purpose of this lesson is to promote a comprehensive and effective weekly exercise routine in order to improve patients' overall level of fitness.   Managing Heart Disease: Your Path to a Healthier Heart Clinical staff led group instruction and group discussion with PowerPoint presentation and patient guidebook. To enhance the learning environment the use of posters, models and videos may be added.At the conclusion of this workshop, patients will understand the anatomy and physiology of the heart. Additionally, they will understand how Pritikin's three pillars impact the risk factors, the progression, and the management of heart disease.  The purpose of this lesson is to provide a high-level  overview of the heart, heart disease, and how the Pritikin lifestyle positively impacts risk factors.  Exercise Biomechanics Clinical staff led group instruction and group discussion with PowerPoint presentation and patient guidebook. To enhance the learning environment the use of posters, models and videos may be added. Patients will learn how the structural parts of their bodies function and how these functions impact their daily activities, movement, and exercise. Patients will learn how to promote a neutral spine, learn how to manage pain, and identify ways to improve their physical movement in order to promote healthy living. The purpose of this lesson is to expose patients to common physical limitations that impact physical activity. Participants will learn practical ways to adapt and manage aches and pains, and to minimize their effect on regular exercise. Patients will learn how to maintain good posture while sitting, walking, and lifting.  Balance Training and Fall Prevention  Clinical staff led group instruction and group discussion with PowerPoint presentation and patient guidebook. To enhance the learning environment the use of posters, models and videos may be added. At the conclusion of this workshop, patients will understand the importance of their sensorimotor skills (vision, proprioception, and the vestibular system) in maintaining their ability to balance as they age. Patients will apply a variety of balancing exercises that are appropriate for their current level of function. Patients will understand the common causes for poor balance, possible solutions to these problems, and ways to modify their physical environment in order to minimize their fall risk. The purpose of this lesson is to teach patients about the importance of maintaining balance as they age and ways to minimize their risk of falling.  WORKSHOPS   Nutrition:  Fueling a Ship broker led group  instruction and group discussion with PowerPoint presentation and patient guidebook. To enhance the learning environment the use of posters, models and videos may be added. Patients will review the foundational principles of the Pritikin Eating Plan and understand what constitutes a serving size in each of the food groups. Patients will also learn Pritikin-friendly foods that are better choices when away from home and review make-ahead meal and snack options. Calorie density will be reviewed and applied to three nutrition priorities: weight maintenance, weight loss, and weight gain. The purpose of this lesson is to reinforce (in a group setting) the key concepts around what patients are recommended to eat and how to apply these guidelines when away from home by planning and selecting Pritikin-friendly options. Patients will understand how calorie density may be adjusted for different weight management goals.  Mindful Eating  Clinical staff led group instruction and group discussion with PowerPoint presentation and patient guidebook. To enhance the learning environment the use of posters, models and videos may be added. Patients will briefly review the concepts of the Pritikin  Eating Plan and the importance of low-calorie dense foods. The concept of mindful eating will be introduced as well as the importance of paying attention to internal hunger signals. Triggers for non-hunger eating and techniques for dealing with triggers will be explored. The purpose of this lesson is to provide patients with the opportunity to review the basic principles of the Pritikin Eating Plan, discuss the value of eating mindfully and how to measure internal cues of hunger and fullness using the Hunger Scale. Patients will also discuss reasons for non-hunger eating and learn strategies to use for controlling emotional eating.  Targeting Your Nutrition Priorities Clinical staff led group instruction and group discussion with  PowerPoint presentation and patient guidebook. To enhance the learning environment the use of posters, models and videos may be added. Patients will learn how to determine their genetic susceptibility to disease by reviewing their family history. Patients will gain insight into the importance of diet as part of an overall healthy lifestyle in mitigating the impact of genetics and other environmental insults. The purpose of this lesson is to provide patients with the opportunity to assess their personal nutrition priorities by looking at their family history, their own health history and current risk factors. Patients will also be able to discuss ways of prioritizing and modifying the Pritikin Eating Plan for their highest risk areas  Menu  Clinical staff led group instruction and group discussion with PowerPoint presentation and patient guidebook. To enhance the learning environment the use of posters, models and videos may be added. Using menus brought in from [employer], or printed from [employer], patients will apply the Pritikin dining out guidelines that were presented in the [employer] video. Patients will also be able to practice these guidelines in a variety of provided scenarios. The purpose of this lesson is to provide patients with the opportunity to practice hands-on learning of the Pritikin Dining Out guidelines with actual menus and practice scenarios.  Label Reading Clinical staff led group instruction and group discussion with PowerPoint presentation and patient guidebook. To enhance the learning environment the use of posters, models and videos may be added. Patients will review and discuss the Pritikin label reading guidelines presented in P[employer]ikin's Label Reading Educational series video. Using fool labels brought in from local grocery stores and markets, patients will apply the label reading guidelines and determine if the packaged food meet the Pritikin  guidelines. The purpose of this lesson is to provide patients with the opportunity to review, discuss, and practice hands-on learning of the Pritikin Label Reading guidelines with actual packaged food labels. Cooking School  Pritikin's [employer] are designed to teach patients ways to prepare quick, simple, and affordable recipes at home. The importance of nutrition's role in chronic disease risk reduction is reflected in its emphasis in the overall Pritikin program. By learning how to prepare essential core Pritikin Eating Plan recipes, patients will increase control over what they eat; be able to customize the flavor of foods without the use of added salt, sugar, or fat; and improve the quality of the food they consume. By learning a set of core recipes which are easily assembled, quickly prepared, and affordable, patients are more likely to prepare more healthy foods at home. These workshops focus on convenient breakfasts, simple entres, side dishes, and desserts which can be prepared with minimal effort and are consistent with nutrition recommendations for cardiovascular risk reduction. Cooking [employer] are taught by a Armed forces logistics/support/administrative officer (RD) who  has been trained by the [employer] team. The chef or RD has a clear understanding of the importance of minimizing - if not completely eliminating - added fat, sugar, and sodium in recipes. Throughout the series of Cooking School Workshop sessions, patients will learn about healthy ingredients and efficient methods of cooking to build confidence in their capability to prepare    Cooking School weekly topics:  Adding Flavor- Sodium-Free  Fast and Healthy Breakfasts  Powerhouse Plant-Based Proteins  Satisfying Salads and Dressings  Simple Sides and Sauces  International Cuisine-Spotlight on the [employer] Zones  Delicious Desserts  Savory Soups  [employer] - Meals in a Astronomer Appetizers and  Snacks  Comforting Weekend Breakfasts  One-Pot Wonders   Fast Evening Meals  Landscape architect Your Pritikin Plate  WORKSHOPS   Healthy Mindset (Psychosocial):  Focused Goals, Sustainable Changes Clinical staff led group instruction and group discussion with PowerPoint presentation and patient guidebook. To enhance the learning environment the use of posters, models and videos may be added. Patients will be able to apply effective goal setting strategies to establish at least one personal goal, and then take consistent, meaningful action toward that goal. They will learn to identify common barriers to achieving personal goals and develop strategies to overcome them. Patients will also gain an understanding of how our mind-set can impact our ability to achieve goals and the importance of cultivating a positive and growth-oriented mind-set. The purpose of this lesson is to provide patients with a deeper understanding of how to set and achieve personal goals, as well as the tools and strategies needed to overcome common obstacles which may arise along the way.  From Head to Heart: The Power of a Healthy Outlook  Clinical staff led group instruction and group discussion with PowerPoint presentation and patient guidebook. To enhance the learning environment the use of posters, models and videos may be added. Patients will be able to recognize and describe the impact of emotions and mood on physical health. They will discover the importance of self-care and explore self-care practices which may work for them. Patients will also learn how to utilize the 4 C's to cultivate a healthier outlook and better manage stress and challenges. The purpose of this lesson is to demonstrate to patients how a healthy outlook is an essential part of maintaining good health, especially as they continue their cardiac rehab journey.  Healthy Sleep for a Healthy Heart Clinical staff led group instruction and  group discussion with PowerPoint presentation and patient guidebook. To enhance the learning environment the use of posters, models and videos may be added. At the conclusion of this workshop, patients will be able to demonstrate knowledge of the importance of sleep to overall health, well-being, and quality of life. They will understand the symptoms of, and treatments for, common sleep disorders. Patients will also be able to identify daytime and nighttime behaviors which impact sleep, and they will be able to apply these tools to help manage sleep-related challenges. The purpose of this lesson is to provide patients with a general overview of sleep and outline the importance of quality sleep. Patients will learn about a few of the most common sleep disorders. Patients will also be introduced to the concept of "sleep hygiene," and discover ways to self-manage certain sleeping problems through simple daily behavior changes. Finally, the workshop will motivate patients by clarifying the links between quality sleep and their goals of heart-healthy living.   Recognizing and Reducing Stress  Clinical staff led group instruction and group discussion with PowerPoint presentation and patient guidebook. To enhance the learning environment the use of posters, models and videos may be added. At the conclusion of this workshop, patients will be able to understand the types of stress reactions, differentiate between acute and chronic stress, and recognize the impact that chronic stress has on their health. They will also be able to apply different coping mechanisms, such as reframing negative self-talk. Patients will have the opportunity to practice a variety of stress management techniques, such as deep abdominal breathing, progressive muscle relaxation, and/or guided imagery.  The purpose of this lesson is to educate patients on the role of stress in their lives and to provide healthy techniques for coping with  it.  Learning Barriers/Preferences:  Learning Barriers/Preferences - 04/03/23 1213       Learning Barriers/Preferences   Learning Barriers Exercise Concerns;Hearing   HOH and balance concerns   Learning Preferences Computer/Internet;Group Instruction;Individual Instruction;Skilled Demonstration;Video;Written Material;Pictoral             Education Topics:  Knowledge Questionnaire Score:  Knowledge Questionnaire Score - 04/03/23 1214       Knowledge Questionnaire Score   Pre Score 21/24             Core Components/Risk Factors/Patient Goals at Admission:  Personal Goals and Risk Factors at Admission - 04/03/23 1216       Core Components/Risk Factors/Patient Goals on Admission    Weight Management Yes;Weight Loss    Intervention Weight Management: Develop a combined nutrition and exercise program designed to reach desired caloric intake, while maintaining appropriate intake of nutrient and fiber, sodium and fats, and appropriate energy expenditure required for the weight goal.;Weight Management: Provide education and appropriate resources to help participant work on and attain dietary goals.;Weight Management/Obesity: Establish reasonable short term and long term weight goals.;Obesity: Provide education and appropriate resources to help participant work on and attain dietary goals.    Admit Weight 154 lb 8.7 oz (70.1 kg)    Hypertension Yes    Intervention Provide education on lifestyle modifcations including regular physical activity/exercise, weight management, moderate sodium restriction and increased consumption of fresh fruit, vegetables, and low fat dairy, alcohol moderation, and smoking cessation.;Monitor prescription use compliance.    Expected Outcomes Short Term: Continued assessment and intervention until BP is < 140/37mm HG in hypertensive participants. < 130/68mm HG in hypertensive participants with diabetes, heart failure or chronic kidney disease.;Long Term:  Maintenance of blood pressure at goal levels.    Lipids Yes    Intervention Provide education and support for participant on nutrition & aerobic/resistive exercise along with prescribed medications to achieve LDL 70mg , HDL >40mg .    Expected Outcomes Short Term: Participant states understanding of desired cholesterol values and is compliant with medications prescribed. Participant is following exercise prescription and nutrition guidelines.;Long Term: Cholesterol controlled with medications as prescribed, with individualized exercise RX and with personalized nutrition plan. Value goals: LDL < 70mg , HDL > 40 mg.    Stress Yes    Intervention Offer individual and/or small group education and counseling on adjustment to heart disease, stress management and health-related lifestyle change. Teach and support self-help strategies.;Refer participants experiencing significant psychosocial distress to appropriate mental health specialists for further evaluation and treatment. When possible, include family members and significant others in education/counseling sessions.    Expected Outcomes Short Term: Participant demonstrates changes in health-related behavior, relaxation and other stress management skills, ability to obtain effective social support, and compliance with psychotropic  medications if prescribed.;Long Term: Emotional wellbeing is indicated by absence of clinically significant psychosocial distress or social isolation.    Personal Goal Other Yes    Personal Goal short term: balance training, know limits to exercise Long term: tuned up to keep up with grandkids, 14 min paced mile    Intervention Will continue to monitor pt and progress workloads as tolerated without sign or symptom    Expected Outcomes Pt will achive her goals and gain strength             Core Components/Risk Factors/Patient Goals Review:   Goals and Risk Factor Review     Row Name 04/10/23 0759 04/22/23 1432            Core Components/Risk Factors/Patient Goals Review   Personal Goals Review Weight Management/Obesity;Hypertension;Lipids;Stress Weight Management/Obesity;Hypertension;Lipids;Stress      Review Pam started intensive cardiac rehab on 04/09/23 and did well with exercise. Spot checked CBG as Pam is a diet controlled prediabetic. Vital signs and CBG's were stable Pam is doing  well with exercise at cardiac rehab.. Vital signs have been stable. Pam is enjoying participating in cardiac rehab. Pam says coming to cardiac rehab his her "happy place"      Expected Outcomes Pam will continue to participate in intensive cardiac rehab for exercise, nutrition and lifestyle modifications Pam will continue to participate in intensive cardiac rehab for exercise, nutrition and lifestyle modifications               Core Components/Risk Factors/Patient Goals at Discharge (Final Review):   Goals and Risk Factor Review - 04/22/23 1432       Core Components/Risk Factors/Patient Goals Review   Personal Goals Review Weight Management/Obesity;Hypertension;Lipids;Stress    Review Pam is doing  well with exercise at cardiac rehab.. Vital signs have been stable. Pam is enjoying participating in cardiac rehab. Pam says coming to cardiac rehab his her "happy place"    Expected Outcomes Pam will continue to participate in intensive cardiac rehab for exercise, nutrition and lifestyle modifications             ITP Comments:  ITP Comments     Row Name 04/03/23 0916 04/10/23 0749 04/22/23 1421       ITP Comments Armanda Magic, MD: Medical Director.  Introduction to the Pritikin Education program / Intensive Cardiac Rehab.  Initial orientation packet reviewed with the patient. 30 Day ITP Review. Pam started intensive cardiac rehab on 04/09/23 and did well with exercise 30 Day ITP Review. Pam has good attendance and participation in intensive cardiac rehab exercise and is off to a good start to exercoise               Comments: See ITP Comments

## 2023-04-23 ENCOUNTER — Ambulatory Visit (HOSPITAL_BASED_OUTPATIENT_CLINIC_OR_DEPARTMENT_OTHER): Payer: Medicare PPO | Admitting: Family

## 2023-04-23 ENCOUNTER — Encounter (HOSPITAL_COMMUNITY)
Admission: RE | Admit: 2023-04-23 | Discharge: 2023-04-23 | Disposition: A | Discharge status: Home / Self Care | Payer: Medicare PPO | Source: Ambulatory Visit | Attending: Cardiology | Admitting: Cardiology

## 2023-04-23 DIAGNOSIS — I214 Non-ST elevation (NSTEMI) myocardial infarction: Secondary | ICD-10-CM

## 2023-04-23 DIAGNOSIS — I252 Old myocardial infarction: Secondary | ICD-10-CM | POA: Diagnosis not present

## 2023-04-23 DIAGNOSIS — I5181 Takotsubo syndrome: Secondary | ICD-10-CM

## 2023-04-23 NOTE — Progress Notes (Signed)
Reviewed home exercise guidelines with Pam including endpoints, temperature precautions, target heart rate and rate of perceived exertion. She is currently walking daily as her mode of home exercise. She has been walking twice a day, adding 2 minutes each session per her MD. She is currently up to 18 minutes and has experienced chest discomfort later after that walk. We discussed walking 15 minutes at a comfortable pace once or twice per day depending on how she feels, 2-4 days/week in addition to exercise at cardiac rehab 30 minutes 3 days/week with the goal of achieving 150 minutes of aerobic exercise/week. She uses the [employer] app to track her walking pace.She voices understanding of instructions given.  Artist Pais, MS, ACSM CEP

## 2023-04-25 ENCOUNTER — Encounter (HOSPITAL_COMMUNITY)
Admission: RE | Admit: 2023-04-25 | Discharge: 2023-04-25 | Disposition: A | Discharge status: Home / Self Care | Payer: Medicare PPO | Source: Ambulatory Visit | Attending: Cardiology | Admitting: Cardiology

## 2023-04-25 DIAGNOSIS — I5181 Takotsubo syndrome: Secondary | ICD-10-CM | POA: Diagnosis not present

## 2023-04-25 DIAGNOSIS — I214 Non-ST elevation (NSTEMI) myocardial infarction: Secondary | ICD-10-CM

## 2023-04-25 DIAGNOSIS — I252 Old myocardial infarction: Secondary | ICD-10-CM | POA: Diagnosis not present

## 2023-04-28 ENCOUNTER — Encounter (HOSPITAL_COMMUNITY)
Admission: RE | Admit: 2023-04-28 | Discharge: 2023-04-28 | Disposition: A | Discharge status: Home / Self Care | Payer: Medicare PPO | Source: Ambulatory Visit | Attending: Cardiology | Admitting: Cardiology

## 2023-04-28 DIAGNOSIS — I5181 Takotsubo syndrome: Secondary | ICD-10-CM | POA: Diagnosis not present

## 2023-04-28 DIAGNOSIS — I214 Non-ST elevation (NSTEMI) myocardial infarction: Secondary | ICD-10-CM

## 2023-04-28 DIAGNOSIS — I252 Old myocardial infarction: Secondary | ICD-10-CM | POA: Diagnosis not present

## 2023-04-30 ENCOUNTER — Encounter (HOSPITAL_COMMUNITY)
Admission: RE | Admit: 2023-04-30 | Discharge: 2023-04-30 | Disposition: A | Discharge status: Home / Self Care | Payer: Medicare PPO | Source: Ambulatory Visit | Attending: Cardiology | Admitting: Cardiology

## 2023-04-30 DIAGNOSIS — I5181 Takotsubo syndrome: Secondary | ICD-10-CM | POA: Diagnosis not present

## 2023-04-30 DIAGNOSIS — I252 Old myocardial infarction: Secondary | ICD-10-CM | POA: Diagnosis not present

## 2023-04-30 DIAGNOSIS — I214 Non-ST elevation (NSTEMI) myocardial infarction: Secondary | ICD-10-CM

## 2023-05-02 ENCOUNTER — Encounter (HOSPITAL_COMMUNITY): Admission: RE | Admit: 2023-05-02 | Payer: Medicare PPO | Source: Ambulatory Visit

## 2023-05-02 DIAGNOSIS — I252 Old myocardial infarction: Secondary | ICD-10-CM | POA: Diagnosis not present

## 2023-05-02 DIAGNOSIS — I214 Non-ST elevation (NSTEMI) myocardial infarction: Secondary | ICD-10-CM

## 2023-05-02 DIAGNOSIS — I5181 Takotsubo syndrome: Secondary | ICD-10-CM | POA: Diagnosis not present

## 2023-05-05 ENCOUNTER — Encounter (HOSPITAL_COMMUNITY): Admission: RE | Admit: 2023-05-05 | Payer: Medicare PPO | Source: Ambulatory Visit

## 2023-05-05 DIAGNOSIS — I5181 Takotsubo syndrome: Secondary | ICD-10-CM | POA: Diagnosis not present

## 2023-05-05 DIAGNOSIS — I252 Old myocardial infarction: Secondary | ICD-10-CM | POA: Diagnosis not present

## 2023-05-05 DIAGNOSIS — I214 Non-ST elevation (NSTEMI) myocardial infarction: Secondary | ICD-10-CM

## 2023-05-06 DIAGNOSIS — R339 Retention of urine, unspecified: Secondary | ICD-10-CM | POA: Diagnosis not present

## 2023-05-07 ENCOUNTER — Encounter (HOSPITAL_COMMUNITY)
Admission: RE | Admit: 2023-05-07 | Discharge: 2023-05-07 | Disposition: A | Discharge status: Home / Self Care | Payer: Medicare PPO | Source: Ambulatory Visit | Attending: Cardiology | Admitting: Cardiology

## 2023-05-07 DIAGNOSIS — I5181 Takotsubo syndrome: Secondary | ICD-10-CM | POA: Diagnosis not present

## 2023-05-07 DIAGNOSIS — I214 Non-ST elevation (NSTEMI) myocardial infarction: Secondary | ICD-10-CM

## 2023-05-07 DIAGNOSIS — I252 Old myocardial infarction: Secondary | ICD-10-CM | POA: Diagnosis not present

## 2023-05-09 ENCOUNTER — Encounter (HOSPITAL_COMMUNITY): Payer: Medicare PPO

## 2023-05-09 ENCOUNTER — Telehealth (HOSPITAL_COMMUNITY): Payer: Self-pay

## 2023-05-12 ENCOUNTER — Encounter (HOSPITAL_COMMUNITY)
Admission: RE | Admit: 2023-05-12 | Discharge: 2023-05-12 | Disposition: A | Discharge status: Home / Self Care | Payer: Medicare PPO | Source: Ambulatory Visit | Attending: Cardiology | Admitting: Cardiology

## 2023-05-12 DIAGNOSIS — I214 Non-ST elevation (NSTEMI) myocardial infarction: Secondary | ICD-10-CM

## 2023-05-12 DIAGNOSIS — I5181 Takotsubo syndrome: Secondary | ICD-10-CM

## 2023-05-12 DIAGNOSIS — I252 Old myocardial infarction: Secondary | ICD-10-CM | POA: Diagnosis not present

## 2023-05-13 ENCOUNTER — Other Ambulatory Visit: Payer: Self-pay

## 2023-05-14 ENCOUNTER — Encounter (HOSPITAL_COMMUNITY): Admission: RE | Admit: 2023-05-14 | Payer: Medicare PPO | Source: Ambulatory Visit

## 2023-05-14 DIAGNOSIS — I252 Old myocardial infarction: Secondary | ICD-10-CM | POA: Diagnosis not present

## 2023-05-14 DIAGNOSIS — I5181 Takotsubo syndrome: Secondary | ICD-10-CM

## 2023-05-14 DIAGNOSIS — I214 Non-ST elevation (NSTEMI) myocardial infarction: Secondary | ICD-10-CM

## 2023-05-14 NOTE — Progress Notes (Signed)
Celso Amy, PA-C [address]  [address]  [place], [place] [address]  Main: [phone]  Fax: [phone]   Gastroenterology Consultation  Referring Provider:     Eustaquio Boyden, MD Primary Care Physician:  Eustaquio Boyden, MD Primary Gastroenterologist:  Celso Amy, PA-C  Reason for Consultation:     Irritable bowel syndrome        HPI:   Shelby Vazquez is a 72 y.o. y/o female referred for consultation & management  by Eustaquio Boyden, MD.    Here for follow-up of irritable bowel syndrome and acid reflux.  History of chronic IBS-D for many years with abdominal cramping.  Currently taking Librax 1 tablet twice daily which helps her IBS.  Off Librax she had uncontrollable diarrhea and cramping.  Extensive negative GI evaluation for diarrhea at [facility].  Previous patient of Dr. Matthias Hughs and Celso Amy, PA-C at [facility].  Last GI follow-up 12/2022.  Transferring care to our office.  Currently taking Aciphex 20 Mg daily and Pepcid 40 Mg daily with good control of acid reflux.  In 08/2021 she weaned off Librax and had recurrent severe IBS symptoms.  She tried probiotics, IBgard, Imodium and dicyclomine with little benefit.  She had adverse side effects on high-dose dicyclomine 20 Mg 3 times daily.  She was restarted on Librax 12/2022 and her GI symptoms improved.  Currently takes low-dose dicyclomine 10 Mg twice daily sparingly as needed for abdominal cramping.  -Abdominal pelvic CT 12/2019 showed moderate stool burden with no evidence of diverticulitis.  No acute abnormality. -12/2022:Negative celiac labs, negative GI pathogen panel and negative C. difficile. -Last colonoscopy 03/2018 showed excellent prep and no polyps.  5-year repeat colonoscopy was due 03/2023, due to previous history of colon polyps. -Last EGD 03/2014 showed benign fundic gland gastric polyps, otherwise normal.  No Barrett's. -Colonoscopy 03/2014 showed sessile serrated adenomatous polyps.  Colon biopsies  negative for microscopic colitis.  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  Coronary arteries on catheterization.  Decreased LVEF 35-40% with wall motion abnormalities and grade 1 diastolic dysfunction. Losartan transitioned to Entresto and Lopressor transition to Toprol-XL.  She is followed by Dr. Herbie Baltimore cardiologist at [facility].   Past Medical History:  Diagnosis Date   Abdominal aortic atherosclerosis (HCC) 10/2014   by CT scan   B12 deficiency    Chronic anxiety    Chronic cough    cyclical cough syndrome   Complication of anesthesia 9 yrs ago   awake and in severe pain with colonoscopy   Concussion    Diet-controlled diabetes mellitus (HCC) 02/22/2017   New dx 02/2017   GERD (gastroesophageal reflux disease)    severe leading to chronic cough and hoarseness   History of Epstein-Barr virus infection 2012   Hyperlipidemia    Hypertension    IBS (irritable bowel syndrome)    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, PB-8 probiotic, healthy diet and exercise   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   By CT 09/2017 & 09/2018 - max diameter 4cm f/u annually    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   had surgery with 5 days of chemotherapy   Osteopenia 01/2015   T -0.8 spine, -1.7 hip   Pyelonephritis due to Escherichia coli 07/16/2017   Uterine prolapse    Vocal cord paralysis, bilateral partial 2001   evaluated at [facility] - did not require surgery    Past  Surgical History:  Procedure Laterality Date   BUNIONECTOMY     [place] '2000   CARDIAC CATHETERIZATION     CATARACT EXTRACTION     right 01/2010, left may 2011   COLONOSCOPY  03/2018   diverticulosis, rpt 5 yrs ([name])   COLONOSCOPY WITH PROPOFOL N/A 03/31/2014   sessile serrated adenomas, sigmoid diverticulosis, rpt 3 yrs Bernette Redbird V, MD)   CORONARY CALCIUM SCORE-CORONARY CTA  09/2017   Calcium score 53  . Normal right dominant coronary artery system with mild onset of CAD. Aneurysmal dilation of the ascending aorta with maximum diameter diameter of 4.0-3.8 centimeter. -Recommend annual CT/MRA December 2018   DILATION AND CURETTAGE OF UTERUS  '77   for molar pregnancy excision   ESOPHAGOGASTRODUODENOSCOPY N/A 03/31/2014   mult gastric polyps, mild mucosal hemorrhages; ESOPHAGOGASTRODUODENOSCOPY (EGD);  Surgeon: Florencia Reasons, MD   ESOPHAGOGASTRODUODENOSCOPY ENDOSCOPY  01/2013   INTERSTIM IMPLANT PLACEMENT  2018   LEFT HEART CATH AND CORONARY ANGIOGRAPHY N/A 03/06/2023   Procedure: LEFT HEART CATH AND CORONARY ANGIOGRAPHY;  Surgeon: Lennette Bihari, MD;  Location: [facility] INVASIVE CV LAB;  Service: Cardiovascular;  Laterality: N/A;   toe surgery Right 09/2018   TRANSTHORACIC ECHOCARDIOGRAM  09/2017   Normal LV size and function. EF 55-60%. GR 1 DD. No regional wall motion abnormalities. No significant valvular abnormalities.    Prior to Admission medications   Medication Sig Start Date End Date Taking? Authorizing Provider  acetaminophen (TYLENOL) 500 MG tablet Take 500 mg by mouth every 6 (six) hours as needed.    [provider]  aspirin 81 MG tablet Take 1 tablet (81 mg total) by mouth daily. 08/18/17   Eustaquio Boyden, MD  Calcium Carbonate-Vitamin D 600-200 MG-UNIT CAPS Take 1 capsule by mouth daily.    [provider]  clidinium-chlordiazePOXIDE (LIBRAX) 5-2.5 MG capsule Take 1 capsule by mouth 2 (two) times daily at 8 am and 10 pm.    [provider]  Estradiol-Estriol-Progesterone (BIEST/PROGESTERONE TD) Place 2 mLs onto the skin in the morning and at bedtime. Apply to inner thighs or arms    [provider]  famotidine (PEPCID) 40 MG tablet TAKE 1 TABLET(40 MG) BY MOUTH AT BEDTIME Patient taking differently: Take 40 mg by mouth at bedtime. 12/05/22   Eustaquio Boyden, MD  loratadine (CLARITIN) 10 MG tablet Take 10 mg by mouth daily.    [provider]  methenamine (HIPREX) 1 g tablet Take 1 tablet (1 g total) by mouth 2 (two) times daily. 03/25/23     methenamine (HIPREX) 1 g tablet 1 gm daily    [provider]  metoprolol succinate (TOPROL-XL) 100 MG 24 hr tablet Take 1 tablet (100 mg total) by mouth daily. Take with or immediately following a meal. 03/24/23   Alver Sorrow, NP  ondansetron (ZOFRAN-ODT) 4 MG disintegrating tablet DISSOLVE 1 TABLET(4 MG) ON THE TONGUE EVERY 8 HOURS AS NEEDED FOR NAUSEA OR VOMITING 12/17/22   Eustaquio Boyden, MD  Probiotic Product (PROBIOTIC PO) Take by mouth daily. 900 billion    [provider]  RABEprazole (ACIPHEX) 20 MG tablet Take 20 mg by mouth daily.    [provider]  rosuvastatin (CRESTOR) 20 MG tablet Take 1 tablet (20 mg total) by mouth every evening. 03/24/23   Alver Sorrow, NP  sacubitril-valsartan (ENTRESTO) 24-26 MG Take 1 tablet by mouth 2 (two) times daily. 03/24/23   Alver Sorrow, NP  spironolactone (ALDACTONE) 25 MG tablet Take 0.5 tablets (  12.5 mg total) by mouth daily. 03/24/23   Alver Sorrow, NP  Vitamin B Complex-C CAPS Take 1 capsule by mouth daily.    [provider]    Family History  Problem Relation Age of Onset   Cancer Father        Esophagus   Heart disease Mother 76   Hyperlipidemia Mother    Hypertension Mother    Heart attack Mother 68   Cancer Maternal Aunt        breast   Heart attack Maternal Grandmother    Heart attack Maternal Grandfather    Heart disease Paternal Grandmother    Heart disease Paternal Grandfather    Hyperlipidemia Brother    Hypertension Brother    Diabetes Neg Hx      Social History   Tobacco Use   Smoking status: Never   Smokeless tobacco: Never  Vaping Use   Vaping status: Never Used  Substance Use Topics   Alcohol use: Not Currently    Comment: twice a year   Drug use: No    Allergies as of 05/15/2023 - Review Complete 05/15/2023  Allergen Reaction Noted    Ciprofloxacin Nausea And Vomiting 09/05/2016   Sulfamethoxazole-trimethoprim Nausea And Vomiting 09/05/2016   Augmentin [amoxicillin-pot clavulanate] Other (See Comments)    Erythromycin Rash 11/19/2010    Review of Systems:    All systems reviewed and negative except where noted in HPI.   Physical Exam:  BP 105/67   Pulse 76   Temp 98.2 F (36.8 C)   Ht 5' 4.5" (1.638 m)   Wt 151 lb 3.2 oz (68.6 kg)   BMI 25.55 kg/m  No LMP recorded. Patient is postmenopausal. Psych:  Alert and cooperative. Normal mood and affect. General:   Alert,  Well-developed, well-nourished, pleasant and cooperative in NAD Psych:  Alert and cooperative. Normal mood and affect.  Imaging Studies: No results found.  Assessment and Plan:   Shelby Vazquez is a 72 y.o. y/o female has been referred for:  1.  Irritable bowel syndrome, diarrhea predominant - Currently Improved.  Continue Librax 1 tablet twice daily.   No longer taking dicyclomine - it did not help.  Continue OTC Imodium prn.  2.  Acid reflux  Continue Aciphex 20 Mg daily and Pepcid 40 Mg daily.  3.  History of adenomatous colon polyps  Pt. Prefers to postpone Colonoscopy 6-12 months until she has fully recovered from Takotsubo (stress-induced) cardiomyopathy.  4.  History of Takotsubo (stress-induced) cardiomyopathy with normal coronary arteries.  Echo 02/2023 showed LVEF 35 to 40%.  Followed by Dr. Herbie Baltimore at [facility] cardiology.  Planning to have repeat echocardiogram in 3 months (05/2023).   Follow up in 6 months. Celso Amy, PA-C

## 2023-05-15 ENCOUNTER — Encounter: Payer: Self-pay | Admitting: Physician Assistant

## 2023-05-15 ENCOUNTER — Other Ambulatory Visit (HOSPITAL_BASED_OUTPATIENT_CLINIC_OR_DEPARTMENT_OTHER): Payer: Self-pay

## 2023-05-15 ENCOUNTER — Ambulatory Visit: Payer: Medicare PPO | Admitting: Physician Assistant

## 2023-05-15 VITALS — BP 105/67 | HR 76 | Temp 98.2°F | Ht 64.5 in | Wt 151.2 lb

## 2023-05-15 DIAGNOSIS — K219 Gastro-esophageal reflux disease without esophagitis: Secondary | ICD-10-CM | POA: Diagnosis not present

## 2023-05-15 DIAGNOSIS — K580 Irritable bowel syndrome with diarrhea: Secondary | ICD-10-CM | POA: Diagnosis not present

## 2023-05-15 DIAGNOSIS — Z86010 Personal history of colonic polyps: Secondary | ICD-10-CM

## 2023-05-15 MED ORDER — RABEPRAZOLE SODIUM 20 MG PO TBEC
20.0000 mg | DELAYED_RELEASE_TABLET | Freq: Every day | ORAL | 3 refills | Status: DC
Start: 2023-05-15 — End: 2024-05-14
  Filled 2023-05-15: qty 90, 90d supply, fill #0
  Filled 2023-08-18: qty 90, 90d supply, fill #1
  Filled 2023-11-14: qty 90, 90d supply, fill #2
  Filled 2024-02-18: qty 90, 90d supply, fill #3

## 2023-05-15 MED ORDER — CILIDINIUM-CHLORDIAZEPOXIDE 2.5-5 MG PO CAPS
1.0000 | ORAL_CAPSULE | Freq: Two times a day (BID) | ORAL | 3 refills | Status: DC
Start: 2023-05-15 — End: 2024-04-16

## 2023-05-15 MED ORDER — FAMOTIDINE 40 MG PO TABS
40.0000 mg | ORAL_TABLET | Freq: Every day | ORAL | 3 refills | Status: DC
Start: 2023-05-15 — End: 2024-05-22
  Filled 2023-05-15 – 2023-06-03 (×2): qty 90, 90d supply, fill #0
  Filled 2023-08-18: qty 90, 90d supply, fill #1
  Filled 2023-12-09: qty 90, 90d supply, fill #2
  Filled 2024-03-05: qty 90, 90d supply, fill #3

## 2023-05-16 ENCOUNTER — Encounter (HOSPITAL_COMMUNITY)
Admission: RE | Admit: 2023-05-16 | Discharge: 2023-05-16 | Disposition: A | Discharge status: Home / Self Care | Payer: Medicare PPO | Source: Ambulatory Visit | Attending: Cardiology | Admitting: Cardiology

## 2023-05-16 DIAGNOSIS — I5181 Takotsubo syndrome: Secondary | ICD-10-CM | POA: Diagnosis not present

## 2023-05-16 DIAGNOSIS — I214 Non-ST elevation (NSTEMI) myocardial infarction: Secondary | ICD-10-CM

## 2023-05-16 DIAGNOSIS — I252 Old myocardial infarction: Secondary | ICD-10-CM | POA: Diagnosis not present

## 2023-05-19 ENCOUNTER — Encounter (HOSPITAL_COMMUNITY)
Admission: RE | Admit: 2023-05-19 | Discharge: 2023-05-19 | Disposition: A | Discharge status: Home / Self Care | Payer: Medicare PPO | Source: Ambulatory Visit | Attending: Cardiology | Admitting: Cardiology

## 2023-05-19 DIAGNOSIS — I5181 Takotsubo syndrome: Secondary | ICD-10-CM | POA: Diagnosis not present

## 2023-05-19 DIAGNOSIS — I214 Non-ST elevation (NSTEMI) myocardial infarction: Secondary | ICD-10-CM

## 2023-05-19 DIAGNOSIS — I252 Old myocardial infarction: Secondary | ICD-10-CM | POA: Diagnosis not present

## 2023-05-19 NOTE — Progress Notes (Incomplete)
Cardiac Individual Treatment Plan  Patient Details  Name: Shelby Vazquez MRN: [id number] Date of Birth: 1951-07-13 Referring Provider:   Flowsheet Row INTENSIVE CARDIAC REHAB ORIENT from 04/03/2023 in [facility]  Referring Provider Dr. Bryan Lemma MD       Initial Encounter Date:  Flowsheet Row INTENSIVE CARDIAC REHAB ORIENT from 04/03/2023 in [facility]  Date 04/03/23       Visit Diagnosis: 03/05/23 NSTEMI  03/05/23 Takotsubo cardiomyopathy  Patient's Home Medications on Admission:  Current Outpatient Medications:    acetaminophen (TYLENOL) 500 MG tablet, Take 500 mg by mouth every 6 (six) hours as needed., Disp: , Rfl:    aspirin 81 MG tablet, Take 1 tablet (81 mg total) by mouth daily., Disp: 30 tablet, Rfl:    Calcium Carbonate-Vitamin D 600-200 MG-UNIT CAPS, Take 1 capsule by mouth daily., Disp: , Rfl:    clidinium-chlordiazePOXIDE (LIBRAX) 5-2.5 MG capsule, Take 1 capsule by mouth 2 (two) times daily at 8 am and 10 pm., Disp: 180 capsule, Rfl: 3   Estradiol-Estriol-Progesterone (BIEST/PROGESTERONE TD), Place 2 mLs onto the skin in the morning and at bedtime. Apply to inner thighs or arms, Disp: , Rfl:    famotidine (PEPCID) 40 MG tablet, Take 1 tablet (40 mg total) by mouth daily at bedtime., Disp: 90 tablet, Rfl: 3   loratadine (CLARITIN) 10 MG tablet, Take 10 mg by mouth daily., Disp: , Rfl:    methenamine (HIPREX) 1 g tablet, Take 1 tablet (1 g total) by mouth 2 (two) times daily., Disp: 180 tablet, Rfl: 3   methenamine (HIPREX) 1 g tablet, 1 gm daily, Disp: , Rfl:    metoprolol succinate (TOPROL-XL) 100 MG 24 hr tablet, Take 1 tablet (100 mg total) by mouth daily. Take with or immediately following a meal., Disp: 90 tablet, Rfl: 3   ondansetron (ZOFRAN-ODT) 4 MG disintegrating tablet, DISSOLVE 1 TABLET(4 MG) ON THE TONGUE EVERY 8 HOURS AS NEEDED FOR  NAUSEA OR VOMITING, Disp: 30 tablet, Rfl: 00   Probiotic Product (PROBIOTIC PO), Take by mouth daily. 900 billion, Disp: , Rfl:    RABEprazole (ACIPHEX) 20 MG tablet, Take 1 tablet (20 mg total) by mouth daily., Disp: 90 tablet, Rfl: 3   rosuvastatin (CRESTOR) 20 MG tablet, Take 1 tablet (20 mg total) by mouth every evening., Disp: 90 tablet, Rfl: 3   sacubitril-valsartan (ENTRESTO) 24-26 MG, Take 1 tablet by mouth 2 (two) times daily., Disp: 180 tablet, Rfl: 3   spironolactone (ALDACTONE) 25 MG tablet, Take 0.5 tablets (12.5 mg total) by mouth daily., Disp: 45 tablet, Rfl: 3   Vitamin B Complex-C CAPS, Take 1 capsule by mouth daily., Disp: , Rfl:    Wheat Dextrin (BENEFIBER DRINK MIX PO), Take by mouth., Disp: , Rfl:   Past Medical History: Past Medical History:  Diagnosis Date   Abdominal aortic atherosclerosis (HCC) 10/2014   by CT scan   B12 deficiency    Chronic anxiety    Chronic cough    cyclical cough syndrome   Complication of anesthesia 9 yrs ago   awake and in severe pain with colonoscopy   Concussion    Diet-controlled diabetes mellitus (HCC) 02/22/2017   New dx 02/2017   GERD (gastroesophageal reflux disease)    severe leading to chronic cough and hoarseness   History of Epstein-Barr virus infection 2012   Hyperlipidemia    Hypertension  IBS (irritable bowel syndrome)    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, PB-8 probiotic, healthy diet and exercise   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   By CT 09/2017 & 09/2018 - max diameter 4cm f/u annually    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   had surgery with 5 days of chemotherapy   Osteopenia 01/2015   T -0.8 spine, -1.7 hip   Pyelonephritis due to Escherichia coli 07/16/2017   Uterine prolapse    Vocal cord paralysis, bilateral partial 2001   evaluated at [facility] - did not require surgery    Tobacco Use: Social History   Tobacco Use  Smoking Status Never  Smokeless Tobacco Never     Labs: Review Flowsheet  More data exists      Latest Ref Rng & Units 04/17/2021 03/19/2022 07/03/2022 11/08/2022 03/06/2023  Labs for ITP Cardiac and Pulmonary Rehab  Cholestrol 0 - 200 mg/dL 829  - 562  - 130   LDL (calc) 0 - 99 mg/dL 64  - 53  - 71   Direct LDL 0 - 99 mg/dL - 69  - - -  HDL-C >86 mg/dL 57.84  - 69.62  - 37   Trlycerides <150 mg/dL 952.8  - 413.2  - 88   Hemoglobin A1c 4.8 - 5.6 % 6.6  - 6.4  6.1  6.2     Details            Capillary Blood Glucose: Lab Results  Component Value Date   GLUCAP 81 04/09/2023   GLUCAP 125 (H) 04/09/2023   GLUCAP 136 (H) 07/20/2017   GLUCAP 116 (H) 07/20/2017   GLUCAP 108 (H) 07/19/2017     Exercise Target Goals: Exercise Program Goal: Individual exercise prescription set using results from initial 6 min walk test and THRR while considering  patient's activity barriers and safety.   Exercise Prescription Goal: Initial exercise prescription builds to 30-45 minutes a day of aerobic activity, 2-3 days per week.  Home exercise guidelines will be given to patient during program as part of exercise prescription that the participant will acknowledge.  Activity Barriers & Risk Stratification:  Activity Barriers & Cardiac Risk Stratification - 04/03/23 1205       Activity Barriers & Cardiac Risk Stratification   Activity Barriers Deconditioning;Balance Concerns;Shortness of Breath    Cardiac Risk Stratification High             6 Minute Walk:  6 Minute Walk     Row Name 04/03/23 1146         6 Minute Walk   Phase Initial     Distance 1731 feet     Walk Time 6 minutes     # of Rest Breaks 0     MPH 3.28     METS 3.69     RPE 11     Perceived Dyspnea  1     VO2 Peak 12.91     Symptoms Yes (comment)     Comments Pre walk test patient SOBx1 SaO2 98% possible anxiety. Nurse aware, lung sounds clear. Resolved with rest. Patient felt good on the walk test, SOB x1 post, resolved with rest     Resting HR 64 bpm      Resting BP 112/70     Resting Oxygen Saturation  98 %     Exercise Oxygen Saturation  during 6 min walk 98 %     Max Ex. HR 97 bpm     Max  Ex. BP 156/62     2 Minute Post BP 134/68              Oxygen Initial Assessment:   Oxygen Re-Evaluation:   Oxygen Discharge (Final Oxygen Re-Evaluation):   Initial Exercise Prescription:  Initial Exercise Prescription - 04/03/23 1200       Date of Initial Exercise RX and Referring Provider   Date 04/03/23    Referring Provider Dr. Bryan Lemma MD    Expected Discharge Date 06/13/23      NuStep   Level 2    SPM 80    Minutes 15    METs 2.2      Track   Laps 22    Minutes 15    METs 2.9      Prescription Details   Frequency (times per week) 3    Duration Progress to 30 minutes of continuous aerobic without signs/symptoms of physical distress      Intensity   THRR 40-80% of Max Heartrate 59-118    Ratings of Perceived Exertion 11-13    Perceived Dyspnea 0-4      Progression   Progression Continue progressive overload as per policy without signs/symptoms or physical distress.      Resistance Training   Training Prescription Yes    Weight 2    Reps 10-15             Perform Capillary Blood Glucose checks as needed.  Exercise Prescription Changes:   Exercise Prescription Changes     Row Name 04/09/23 1024 04/21/23 1024 05/05/23 1020 05/19/23 1025       Response to Exercise   Blood Pressure (Admit) 126/74 128/72 104/60 124/78    Blood Pressure (Exercise) 142/70 120/68 122/68 142/62    Blood Pressure (Exit) 112/74 112/72 118/80 110/68    Heart Rate (Admit) 79 bpm 89 bpm 70 bpm 75 bpm    Heart Rate (Exercise) 105 bpm 106 bpm 120 bpm 114 bpm    Heart Rate (Exit) 76 bpm 75 bpm 72 bpm 84 bpm    Rating of Perceived Exertion (Exercise) 11 11 11 11     Symptoms None None None None    Comments Off to a good start with exercise. Reviewed METs -- Reviewed METs    Duration Continue with 30 min of aerobic exercise  without signs/symptoms of physical distress. Continue with 30 min of aerobic exercise without signs/symptoms of physical distress. Continue with 30 min of aerobic exercise without signs/symptoms of physical distress. Continue with 30 min of aerobic exercise without signs/symptoms of physical distress.    Intensity THRR unchanged THRR unchanged THRR unchanged THRR unchanged      Progression   Progression Continue to progress workloads to maintain intensity without signs/symptoms of physical distress. Continue to progress workloads to maintain intensity without signs/symptoms of physical distress. Continue to progress workloads to maintain intensity without signs/symptoms of physical distress. Continue to progress workloads to maintain intensity without signs/symptoms of physical distress.    Average METs 2.7 3.2 3 3.4      Resistance Training   Training Prescription No  Relaxation day, no weights No  Relaxation day, no weights Yes Yes    Weight -- -- 2 lbs 2 lbs    Reps -- -- 10-15 10-15    Time -- -- 10 Minutes 10 Minutes      Interval Training   Interval Training No No No No      NuStep   Level 2 2 2  3  SPM 71 102 109 115    Minutes 15 15 15 15     METs 2.2 2.8 2.7 3.2      Track   Laps 18 21 18 21     Minutes 15 15 15 15     METs 3.3 3.68 3.3 3.68      Home Exercise Plan   Plans to continue exercise at -- -- Home (comment)  Walking Home (comment)  Walking    Frequency -- -- Add 4 additional days to program exercise sessions. Add 4 additional days to program exercise sessions.    Initial Home Exercises Provided -- -- 04/23/23 04/23/23             Exercise Comments:   Exercise Comments     Row Name 04/09/23 1129 04/21/23 1111 04/23/23 1059 05/07/23 1100 05/19/23 1111   Exercise Comments Shelby Vazquez tolerated 1st session of exercise well without symptoms. Oriented to warm-up/cool-down stretching routine. Reviewed METs with patient. Reviewed home exercise guidelines and goals with  patient. Reviewed METs and goals with Shelby Vazquez. Reviewed METs with Shelby Vazquez.            Exercise Goals and Review:   Exercise Goals     Row Name 04/03/23 1208             Exercise Goals   Increase Physical Activity Yes       Intervention Provide advice, education, support and counseling about physical activity/exercise needs.;Develop an individualized exercise prescription for aerobic and resistive training based on initial evaluation findings, risk stratification, comorbidities and participant's personal goals.       Expected Outcomes Short Term: Attend rehab on a regular basis to increase amount of physical activity.;Long Term: Exercising regularly at least 3-5 days a week.;Long Term: Add in home exercise to make exercise part of routine and to increase amount of physical activity.       Increase Strength and Stamina Yes       Intervention Provide advice, education, support and counseling about physical activity/exercise needs.;Develop an individualized exercise prescription for aerobic and resistive training based on initial evaluation findings, risk stratification, comorbidities and participant's personal goals.       Expected Outcomes Short Term: Increase workloads from initial exercise prescription for resistance, speed, and METs.;Short Term: Perform resistance training exercises routinely during rehab and add in resistance training at home;Long Term: Improve cardiorespiratory fitness, muscular endurance and strength as measured by increased METs and functional capacity ( )       Able to understand and use rate of perceived exertion (RPE) scale Yes       Intervention Provide education and explanation on how to use RPE scale       Expected Outcomes Short Term: Able to use RPE daily in rehab to express subjective intensity level;Long Term:  Able to use RPE to guide intensity level when exercising independently       Knowledge and understanding of Target Heart Rate Range (THRR) Yes        Intervention Provide education and explanation of THRR including how the numbers were predicted and where they are located for reference       Expected Outcomes Short Term: Able to state/look up THRR;Long Term: Able to use THRR to govern intensity when exercising independently;Short Term: Able to use daily as guideline for intensity in rehab       Understanding of Exercise Prescription Yes       Intervention Provide education, explanation, and written materials on patient's individual exercise prescription  Expected Outcomes Short Term: Able to explain program exercise prescription;Long Term: Able to explain home exercise prescription to exercise independently                Exercise Goals Re-Evaluation :  Exercise Goals Re-Evaluation     Row Name 04/09/23 1129 04/23/23 1059 05/07/23 1100         Exercise Goal Re-Evaluation   Exercise Goals Review Increase Physical Activity;Increase Strength and Stamina;Able to understand and use rate of perceived exertion (RPE) scale Increase Physical Activity;Increase Strength and Stamina;Able to understand and use rate of perceived exertion (RPE) scale;Able to check pulse independently;Knowledge and understanding of Target Heart Rate Range (THRR);Understanding of Exercise Prescription Increase Physical Activity;Increase Strength and Stamina;Able to understand and use rate of perceived exertion (RPE) scale;Able to check pulse independently;Knowledge and understanding of Target Heart Rate Range (THRR);Understanding of Exercise Prescription     Comments Shelby Vazquez was able to understand and use RPE scale appropriately. Reviewed exercise prescription with Shelby Vazquez. She is walking and occassionally experiences chest discomfort afterwards. Discussed decreasing duration from 18 minutes BID to 15 minutes once or twice/day depending on how she's feeling. She has a smart watch that may be able to check her pulse. Shelby Vazquez states she's stretching, walking outdoors, and light  laps/walking in the pool after walk. Given stretching hand out with the warm-up and cool-down stretches from cardiac rehab.     Expected Outcomes Progress workloads as tolerated to help achieve personal health and fitness goals. Shelby Vazquez will walk 15 minutes 1-2 x's/day 2-4 days/week as tolerated in addition to exercise at cardiac rehab to achieve 150 minutes aerobic exercise/week. Continue home exercise routine in addition to exercise at cardiac rehab.              Discharge Exercise Prescription (Final Exercise Prescription Changes):  Exercise Prescription Changes - 05/19/23 1025       Response to Exercise   Blood Pressure (Admit) 124/78    Blood Pressure (Exercise) 142/62    Blood Pressure (Exit) 110/68    Heart Rate (Admit) 75 bpm    Heart Rate (Exercise) 114 bpm    Heart Rate (Exit) 84 bpm    Rating of Perceived Exertion (Exercise) 11    Symptoms None    Comments Reviewed METs    Duration Continue with 30 min of aerobic exercise without signs/symptoms of physical distress.    Intensity THRR unchanged      Progression   Progression Continue to progress workloads to maintain intensity without signs/symptoms of physical distress.    Average METs 3.4      Resistance Training   Training Prescription Yes    Weight 2 lbs    Reps 10-15    Time 10 Minutes      Interval Training   Interval Training No      NuStep   Level 3    SPM 115    Minutes 15    METs 3.2      Track   Laps 21    Minutes 15    METs 3.68      Home Exercise Plan   Plans to continue exercise at Home (comment)   Walking   Frequency Add 4 additional days to program exercise sessions.    Initial Home Exercises Provided 04/23/23             Nutrition:  Target Goals: Understanding of nutrition guidelines, daily intake of sodium 1500mg , cholesterol 200mg , calories 30% from fat and 7% or less from  saturated fats, daily to have 5 or more servings of fruits and vegetables.  Biometrics:  Pre Biometrics  - 04/03/23 1144       Pre Biometrics   Waist Circumference 37 inches    Hip Circumference 40.5 inches    Waist to Hip Ratio 0.91 %    Triceps Skinfold 19 mm    % Body Fat 37.3 %    Grip Strength 24 kg    Flexibility 12.25 in    Single Leg Stand 4.5 seconds              Nutrition Therapy Plan and Nutrition Goals:  Nutrition Therapy & Goals - 05/09/23 1029       Nutrition Therapy   Diet Heart Healthy Diet    Drug/Food Interactions Statins/Certain Fruits      Personal Nutrition Goals   Nutrition Goal Patient to improve diet quality by using the plate method as a guide for meal planning to include lean protein/plant protein, fruits, vegetables, whole grains, nonfat dairy as part of a well-balanced diet.   goal in action.   Personal Goal #2 Patient to identify strategies for reducing cardiovascular risk by attending the [facility] education and nutrition series weekly.   goal in action.   Personal Goal #3 Patient to reduce sodium to 1500mg  per day   goal in action.   Comments Goals in action. Shelby Vazquez continues to attend the [facility] education and nutrition series. Shelby Vazquez reports making many changes including reduced sodium intake and decreased saturated fat/meat intake. She has maintained her weight since starting with our program. Patient will benefit from participation in intensive cardiac rehab for nutrition, exercise, and lifestyle modification.      Intervention Plan   Intervention Prescribe, educate and counsel regarding individualized specific dietary modifications aiming towards targeted core components such as weight, hypertension, lipid management, diabetes, heart failure and other comorbidities.;Nutrition handout(s) given to patient.    Expected Outcomes Short Term Goal: Understand basic principles of dietary content, such as calories, fat, sodium, cholesterol and nutrients.;Long Term Goal: Adherence to prescribed nutrition plan.             Nutrition Assessments:   Nutrition Assessments - 04/14/23 1006       Rate Your Plate Scores   Pre Score 58            MEDIFICTS Score Key: ?70 Need to make dietary changes  40-70 Heart Healthy Diet ? 40 Therapeutic Level Cholesterol Diet   Flowsheet Row INTENSIVE CARDIAC REHAB from 04/11/2023 in [facility]  Picture Your Plate Total Score on Admission 58      Picture Your Plate Scores: <16 Unhealthy dietary pattern with much room for improvement. 41-50 Dietary pattern unlikely to meet recommendations for good health and room for improvement. 51-60 More healthful dietary pattern, with some room for improvement.  >60 Healthy dietary pattern, although there may be some specific behaviors that could be improved.    Nutrition Goals Re-Evaluation:  Nutrition Goals Re-Evaluation     Row Name 04/11/23 1130 05/09/23 1029           Goals   Current Weight 154 lb 12.2 oz (70.2 kg) 154 lb 5.2 oz (70 kg)      Comment A1c 6.2, lipids WNL-HDL 37, TSH 4.87, lipoprotein A WNL no new labs; most recent labs A1c 6.2, lipids WNL-HDL 37, TSH 4.87, lipoprotein A WNL      Expected Outcome Shelby Vazquez reports making many  changes including reduced sodium intake and decreased saturated fat/meat intake. Patient will benefit from participation in intensive cardiac rehab for nutrition, exercise, and lifestyle modification. Goals in action. Shelby Vazquez continues to attend the [facility] education and nutrition series. Shelby Vazquez reports making many changes including reduced sodium intake and decreased saturated fat/meat intake. She has maintained her weight since starting with our program. Patient will benefit from participation in intensive cardiac rehab for nutrition, exercise, and lifestyle modification.               Nutrition Goals Re-Evaluation:  Nutrition Goals Re-Evaluation     Row Name 04/11/23 1130 05/09/23 1029           Goals   Current Weight 154 lb 12.2 oz (70.2 kg) 154  lb 5.2 oz (70 kg)      Comment A1c 6.2, lipids WNL-HDL 37, TSH 4.87, lipoprotein A WNL no new labs; most recent labs A1c 6.2, lipids WNL-HDL 37, TSH 4.87, lipoprotein A WNL      Expected Outcome Shelby Vazquez reports making many changes including reduced sodium intake and decreased saturated fat/meat intake. Patient will benefit from participation in intensive cardiac rehab for nutrition, exercise, and lifestyle modification. Goals in action. Shelby Vazquez continues to attend the [facility] education and nutrition series. Shelby Vazquez reports making many changes including reduced sodium intake and decreased saturated fat/meat intake. She has maintained her weight since starting with our program. Patient will benefit from participation in intensive cardiac rehab for nutrition, exercise, and lifestyle modification.               Nutrition Goals Discharge (Final Nutrition Goals Re-Evaluation):  Nutrition Goals Re-Evaluation - 05/09/23 1029       Goals   Current Weight 154 lb 5.2 oz (70 kg)    Comment no new labs; most recent labs A1c 6.2, lipids WNL-HDL 37, TSH 4.87, lipoprotein A WNL    Expected Outcome Goals in action. Shelby Vazquez continues to attend the [facility] education and nutrition series. Shelby Vazquez reports making many changes including reduced sodium intake and decreased saturated fat/meat intake. She has maintained her weight since starting with our program. Patient will benefit from participation in intensive cardiac rehab for nutrition, exercise, and lifestyle modification.             Psychosocial: Target Goals: Acknowledge presence or absence of significant depression and/or stress, maximize coping skills, provide positive support system. Participant is able to verbalize types and ability to use techniques and skills needed for reducing stress and depression.  Initial Review & Psychosocial Screening:  Initial Psych Review & Screening - 04/03/23 1101       Initial Review   Current issues with Current Depression;Current  Stress Concerns    Source of Stress Concerns Chronic Illness;Family    Comments Shelby Vazquez says she is currently depressed  and angry due to her recent cardiac event and diagnosis. Shelby Vazquez is also concerened about her husband as he may need colon surgery this summer. Shelby Vazquez is not currently taking an antidepressant and is not interested counselling. i offered to get an appointment with Shelby Vazquez's PCP Dr Renee Ramus she declines at this time and says that he is aware of her suituation. Shelby Vazquez said her sister commited suicide 23 years ago she would never take her own life as she know it would cause alot of pain and hardship for her family.      Family Dynamics   Good Support System? Yes   Shelby Vazquez has her husband, children and grandchildren for support  Barriers   Psychosocial barriers to participate in program The patient should benefit from training in stress management and relaxation.      Screening Interventions   Interventions Encouraged to exercise;Provide feedback about the scores to participant;To provide support and resources with identified psychosocial needs    Expected Outcomes Long Term Goal: Stressors or current issues are controlled or eliminated.;Short Term goal: Identification and review with participant of any Quality of Life or Depression concerns found by scoring the questionnaire.;Long Term goal: The participant improves quality of Life and PHQ9 Scores as seen by post scores and/or verbalization of changes             Quality of Life Scores:  Quality of Life - 04/03/23 1209       Quality of Life   Select Quality of Life      Quality of Life Scores   Health/Function Pre 11.2 %    Socioeconomic Pre 27.86 %    Psych/Spiritual Pre 6.86 %    Family Pre 21.6 %    GLOBAL Pre 15.26 %            Scores of 19 and below usually indicate a poorer quality of life in these areas.  A difference of  2-3 points is a clinically meaningful difference.  A difference of 2-3 points in the total score of  the Quality of Life Index has been associated with significant improvement in overall quality of life, self-image, physical symptoms, and general health in studies assessing change in quality of life.  PHQ-9: Review Flowsheet  More data exists      04/03/2023 11/08/2022 04/17/2022 02/20/2022 12/24/2021  Depression screen PHQ 2/9  Decreased Interest 0 0 1 0 3 1  Down, Depressed, Hopeless 3 0 1 3 1   PHQ - 2 Score 3 0 2 0 6 2  Altered sleeping 1 0 3 3 1   Tired, decreased energy 3 3 2 3 2   Change in appetite 1 0 0 3 2  Feeling bad or failure about yourself  3 0 1 3 2   Trouble concentrating 3 1 1 3 2   Moving slowly or fidgety/restless 1 0 0 3 0  Suicidal thoughts 0 0 0 2 0  PHQ-9 Score 15 4 9 26 11   Difficult doing work/chores Somewhat difficult Somewhat difficult Somewhat difficult Somewhat difficult -    Details       Multiple values from one day are sorted in reverse-chronological order        Interpretation of Total Score  Total Score Depression Severity:  1-4 = Minimal depression, 5-9 = Mild depression, 10-14 = Moderate depression, 15-19 = Moderately severe depression, 20-27 = Severe depression   Psychosocial Evaluation and Intervention:   Psychosocial Re-Evaluation:  Psychosocial Re-Evaluation     Row Name 04/10/23 0751 04/22/23 1423           Psychosocial Re-Evaluation   Current issues with Current Depression;Current Stress Concerns Current Depression;Current Stress Concerns      Comments Shelby Vazquez started intensive cardiac rehab on 04/09/23. Shelby Vazquez said she felt less depressed on her first day of exercise as she was feeling apprehensive about starting intensive cardiac rehab. Will review quality of life and PHQ-9 in the upcoming week Quality of life questionnaire reviewed with patient and was forwarded to Shelby Vazquez's PCP, Dr Sharen Hones. Shelby Vazquez said she felt less depressed since she has been participating in cardiac rehab      Interventions Encouraged to attend Cardiac Rehabilitation for the  exercise;Stress management education;Relaxation  education Encouraged to attend Cardiac Rehabilitation for the exercise;Stress management education;Relaxation education      Continue Psychosocial Services  Follow up required by staff Follow up required by staff        Initial Review   Source of Stress Concerns Chronic Illness;Family Chronic Illness;Family      Comments Will continue to monitor and offer support as needed Will continue to monitor and offer support as needed               Psychosocial Discharge (Final Psychosocial Re-Evaluation):  Psychosocial Re-Evaluation - 04/22/23 1423       Psychosocial Re-Evaluation   Current issues with Current Depression;Current Stress Concerns    Comments Quality of life questionnaire reviewed with patient and was forwarded to Shelby Vazquez's PCP, Dr Sharen Hones. Shelby Vazquez said she felt less depressed since she has been participating in cardiac rehab    Interventions Encouraged to attend Cardiac Rehabilitation for the exercise;Stress management education;Relaxation education    Continue Psychosocial Services  Follow up required by staff      Initial Review   Source of Stress Concerns Chronic Illness;Family    Comments Will continue to monitor and offer support as needed             Vocational Rehabilitation: Provide vocational rehab assistance to qualifying candidates.   Vocational Rehab Evaluation & Intervention:  Vocational Rehab - 04/03/23 1107       Initial Vocational Rehab Evaluation & Intervention   Assessment shows need for Vocational Rehabilitation No   Shelby Vazquez is retired and does not need vocational rehab at this time            Education: Education Goals: Education classes will be provided on a weekly basis, covering required topics. Participant will state understanding/return demonstration of topics presented.    Education     Row Name 04/09/23 1500     Education   Cardiac Education Topics Pritikin   Artist   Educator Dietitian   Weekly Topic [employer] Desserts   Instruction Review Code 1- Verbalizes Understanding   Class Start Time 1145   Class Stop Time 1235   Class Time Calculation (min) 50 min    Row Name 04/11/23 1300     Education   Cardiac Education Topics Pritikin   Licensed conveyancer Nutrition   Nutrition Other  label reading   Instruction Review Code 1- Verbalizes Understanding   Class Start Time 1147   Class Stop Time 1226   Class Time Calculation (min) 39 min    Row Name 04/14/23 1300     Education   Cardiac Education Topics Pritikin   [employer]     Workshops   Educator Exercise Physiologist   Select Exercise   Exercise Workshop Exercise Basics: Diplomatic Services operational officer   Instruction Review Code 1- Verbalizes Understanding   Class Start Time 1145   Class Stop Time 1230   Class Time Calculation (min) 45 min    Row Name 04/16/23 1300     Education   Cardiac Education Topics Pritikin   Customer service manager   Weekly Topic Tasty Appetizers and Snacks   Instruction Review Code 1- Verbalizes Understanding   Class Start Time 1143   Class Stop Time 1215   Class Time Calculation (min) 32 min    Row Name  04/18/23 1400     Education   Cardiac Education Topics Pritikin   Select Core Videos     Core Videos   Educator Exercise Physiologist   Select Nutrition   Nutrition Nutrition Action Plan   Instruction Review Code 1- Verbalizes Understanding   Class Start Time 1145   Class Stop Time 1218   Class Time Calculation (min) 33 min    Row Name 04/21/23 1200     Education   Cardiac Education Topics Pritikin   Actor Dietitian   Select Nutrition   Nutrition Calorie Density   Instruction Review Code 1- Verbalizes Understanding   Class Start Time 1145   Class Stop Time 1223   Class Time Calculation  (min) 38 min    Row Name 04/23/23 1300     Education   Cardiac Education Topics Pritikin   Customer service manager   Weekly Topic Efficiency Cooking - Meals in a Snap   Instruction Review Code 1- Verbalizes Understanding   Class Start Time 1140   Class Stop Time 1220   Class Time Calculation (min) 40 min    Row Name 04/25/23 1200     Education   Cardiac Education Topics Pritikin   Psychologist, forensic Exercise Education   Exercise Education Move It!   Instruction Review Code 1- Verbalizes Understanding   Class Start Time 1150   Class Stop Time 1225   Class Time Calculation (min) 35 min    Row Name 04/28/23 1000     Education   Cardiac Education Topics Pritikin   Geographical information systems officer Psychosocial   Psychosocial Workshop Focused Goals, Sustainable Changes   Instruction Review Code 1- Verbalizes Understanding   Class Start Time 1135   Class Stop Time 1210   Class Time Calculation (min) 35 min    Row Name 04/30/23 1200     Education   Cardiac Education Topics Pritikin   Orthoptist   Educator Dietitian   Weekly Topic One-Pot Wonders   Instruction Review Code 1- Verbalizes Understanding   Class Start Time 1140   Class Stop Time 1211   Class Time Calculation (min) 31 min    Row Name 05/02/23 1200     Education   Cardiac Education Topics Pritikin   Psychologist, forensic General Education   General Education Hypertension and Heart Disease   Instruction Review Code 1- Verbalizes Understanding   Class Start Time 1141   Class Stop Time 1217   Class Time Calculation (min) 36 min    Row Name 05/05/23 1100     Education   Cardiac Education Topics Pritikin   Select Core Videos     Core Videos   Educator Exercise Physiologist    Select Exercise Education   Exercise Education Biomechanial Limitations   Instruction Review Code 1- Verbalizes Understanding   Class Start Time 1150   Class Stop Time 1230   Class Time Calculation (min) 40 min    Row Name 05/07/23 1300     Education   Cardiac Education Topics Pritikin   [employer]  Educator Dietitian   Weekly Topic Comforting Weekend Breakfasts   Instruction Review Code 1- Verbalizes Understanding   Class Start Time 1143   Class Stop Time 1216   Class Time Calculation (min) 33 min    Row Name 05/12/23 1600     Education   Cardiac Education Topics Pritikin   Select Core Videos     Core Videos   Educator Dietitian   Select Nutrition   Nutrition Facts on Fat   Instruction Review Code 1- Verbalizes Understanding   Class Start Time 1150   Class Stop Time 1222   Class Time Calculation (min) 32 min    Row Name 05/14/23 1300     Education   Cardiac Education Topics Pritikin   Orthoptist   Educator Dietitian   Weekly Topic Fast Evening Meals   Instruction Review Code 1- Verbalizes Understanding   Class Start Time 1138   Class Stop Time 1216   Class Time Calculation (min) 38 min    Row Name 05/16/23 1300     Education   Cardiac Education Topics Pritikin   Licensed conveyancer Nutrition   Nutrition Vitamins and Minerals   Instruction Review Code 1- Verbalizes Understanding   Class Start Time 1145   Class Stop Time 1230   Class Time Calculation (min) 45 min    Row Name 05/19/23 1100     Education   Cardiac Education Topics Pritikin   Geographical information systems officer Psychosocial   Psychosocial Workshop Healthy Sleep for a Healthy Heart   Instruction Review Code 1- Verbalizes Understanding   Class Start Time 1148   Class Stop Time 1235   Class Time Calculation (min) 47 min             Core Videos: Exercise    Move It!  Clinical staff conducted group or individual video education with verbal and written material and guidebook.  Patient learns the recommended Pritikin exercise program. Exercise with the goal of living a long, healthy life. Some of the health benefits of exercise include controlled diabetes, healthier blood pressure levels, improved cholesterol levels, improved heart and lung capacity, improved sleep, and better body composition. Everyone should speak with their doctor before starting or changing an exercise routine.  Biomechanical Limitations Clinical staff conducted group or individual video education with verbal and written material and guidebook.  Patient learns how biomechanical limitations can impact exercise and how we can mitigate and possibly overcome limitations to have an impactful and balanced exercise routine.  Body Composition Clinical staff conducted group or individual video education with verbal and written material and guidebook.  Patient learns that body composition (ratio of muscle mass to fat mass) is a key component to assessing overall fitness, rather than body weight alone. Increased fat mass, especially visceral belly fat, can put [place] at increased risk for metabolic syndrome, type 2 diabetes, heart disease, and even death. It is recommended to combine diet and exercise (cardiovascular and resistance training) to improve your body composition. Seek guidance from your physician and exercise physiologist before implementing an exercise routine.  Exercise Action Plan Clinical staff conducted group or individual video education with verbal and written material and guidebook.  Patient learns the recommended strategies to achieve and enjoy long-term exercise adherence, including variety, self-motivation, self-efficacy, and positive decision making. Benefits of  exercise include fitness, good health, weight management, more energy, better sleep,  less stress, and overall well-being.  Medical   Heart Disease Risk Reduction Clinical staff conducted group or individual video education with verbal and written material and guidebook.  Patient learns our heart is our most vital organ as it circulates oxygen, nutrients, white blood cells, and hormones throughout the entire body, and carries waste away. Data supports a plant-based eating plan like the Pritikin Program for its effectiveness in slowing progression of and reversing heart disease. The video provides a number of recommendations to address heart disease.   Metabolic Syndrome and Belly Fat  Clinical staff conducted group or individual video education with verbal and written material and guidebook.  Patient learns what metabolic syndrome is, how it leads to heart disease, and how one can reverse it and keep it from coming back. You have metabolic syndrome if you have 3 of the following 5 criteria: abdominal obesity, high blood pressure, high triglycerides, low HDL cholesterol, and high blood sugar.  Hypertension and Heart Disease Clinical staff conducted group or individual video education with verbal and written material and guidebook.  Patient learns that high blood pressure, or hypertension, is very common in the [place]. Hypertension is largely due to excessive salt intake, but other important risk factors include being overweight, physical inactivity, drinking too much alcohol, smoking, and not eating enough potassium from fruits and vegetables. High blood pressure is a leading risk factor for heart attack, stroke, congestive heart failure, dementia, kidney failure, and premature death. Long-term effects of excessive salt intake include stiffening of the arteries and thickening of heart muscle and organ damage. Recommendations include ways to reduce hypertension and the risk of heart disease.  Diseases of Our Time - Focusing on Diabetes Clinical staff conducted group or individual  video education with verbal and written material and guidebook.  Patient learns why the best way to stop diseases of our time is prevention, through food and other lifestyle changes. Medicine (such as prescription pills and surgeries) is often only a Band-Aid on the problem, not a long-term solution. Most common diseases of our time include obesity, type 2 diabetes, hypertension, heart disease, and cancer. The Pritikin Program is recommended and has been proven to help reduce, reverse, and/or prevent the damaging effects of metabolic syndrome.  Nutrition   Overview of the Pritikin Eating Plan  Clinical staff conducted group or individual video education with verbal and written material and guidebook.  Patient learns about the Pritikin Eating Plan for disease risk reduction. The Pritikin Eating Plan emphasizes a wide variety of unrefined, minimally-processed carbohydrates, like fruits, vegetables, whole grains, and legumes. Go, Caution, and Stop food choices are explained. Plant-based and lean animal proteins are emphasized. Rationale provided for low sodium intake for blood pressure control, low added sugars for blood sugar stabilization, and low added fats and oils for coronary artery disease risk reduction and weight management.  Calorie Density  Clinical staff conducted group or individual video education with verbal and written material and guidebook.  Patient learns about calorie density and how it impacts the Pritikin Eating Plan. Knowing the characteristics of the food you choose will help you decide whether those foods will lead to weight gain or weight loss, and whether you want to consume more or less of them. Weight loss is usually a side effect of the Pritikin Eating Plan because of its focus on low calorie-dense foods.  Label Reading  Clinical staff conducted group or individual video education  with verbal and written material and guidebook.  Patient learns about the Pritikin recommended  label reading guidelines and corresponding recommendations regarding calorie density, added sugars, sodium content, and whole grains.  Dining Out - Part 1  Clinical staff conducted group or individual video education with verbal and written material and guidebook.  Patient learns that restaurant meals can be sabotaging because they can be so high in calories, fat, sodium, and/or sugar. Patient learns recommended strategies on how to positively address this and avoid unhealthy pitfalls.  Facts on Fats  Clinical staff conducted group or individual video education with verbal and written material and guidebook.  Patient learns that lifestyle modifications can be just as effective, if not more so, as many medications for lowering your risk of heart disease. A Pritikin lifestyle can help to reduce your risk of inflammation and atherosclerosis (cholesterol build-up, or plaque, in the artery walls). Lifestyle interventions such as dietary choices and physical activity address the cause of atherosclerosis. A review of the types of fats and their impact on blood cholesterol levels, along with dietary recommendations to reduce fat intake is also included.  Nutrition Action Plan  Clinical staff conducted group or individual video education with verbal and written material and guidebook.  Patient learns how to incorporate Pritikin recommendations into their lifestyle. Recommendations include planning and keeping personal health goals in mind as an important part of their success.  Healthy Mind-Set    Healthy Minds, Bodies, Hearts  Clinical staff conducted group or individual video education with verbal and written material and guidebook.  Patient learns how to identify when they are stressed. Video will discuss the impact of that stress, as well as the many benefits of stress management. Patient will also be introduced to stress management techniques. The way we think, act, and feel has an impact on our  hearts.  How Our Thoughts Can Heal Our Hearts  Clinical staff conducted group or individual video education with verbal and written material and guidebook.  Patient learns that negative thoughts can cause depression and anxiety. This can result in negative lifestyle behavior and serious health problems. Cognitive behavioral therapy is an effective method to help control our thoughts in order to change and improve our emotional outlook.  Additional Videos:  Exercise    Improving Performance  Clinical staff conducted group or individual video education with verbal and written material and guidebook.  Patient learns to use a non-linear approach by alternating intensity levels and lengths of time spent exercising to help burn more calories and lose more body fat. Cardiovascular exercise helps improve heart health, metabolism, hormonal balance, blood sugar control, and recovery from fatigue. Resistance training improves strength, endurance, balance, coordination, reaction time, metabolism, and muscle mass. Flexibility exercise improves circulation, posture, and balance. Seek guidance from your physician and exercise physiologist before implementing an exercise routine and learn your capabilities and proper form for all exercise.  Introduction to Yoga  Clinical staff conducted group or individual video education with verbal and written material and guidebook.  Patient learns about yoga, a discipline of the coming together of mind, breath, and body. The benefits of yoga include improved flexibility, improved range of motion, better posture and core strength, increased lung function, weight loss, and positive self-image. Yoga's heart health benefits include lowered blood pressure, healthier heart rate, decreased cholesterol and triglyceride levels, improved immune function, and reduced stress. Seek guidance from your physician and exercise physiologist before implementing an exercise routine and learn your  capabilities and proper form  for all exercise.  Medical   Aging: Enhancing Your Quality of Life  Clinical staff conducted group or individual video education with verbal and written material and guidebook.  Patient learns key strategies and recommendations to stay in good physical health and enhance quality of life, such as prevention strategies, having an advocate, securing a Health Care Proxy and Power of Attorney, and keeping a list of medications and system for tracking them. It also discusses how to avoid risk for bone loss.  Biology of Weight Control  Clinical staff conducted group or individual video education with verbal and written material and guidebook.  Patient learns that weight gain occurs because we consume more calories than we burn (eating more, moving less). Even if your body weight is normal, you may have higher ratios of fat compared to muscle mass. Too much body fat puts you at increased risk for cardiovascular disease, heart attack, stroke, type 2 diabetes, and obesity-related cancers. In addition to exercise, following the Pritikin Eating Plan can help reduce your risk.  Decoding Lab Results  Clinical staff conducted group or individual video education with verbal and written material and guidebook.  Patient learns that lab test reflects one measurement whose values change over time and are influenced by many factors, including medication, stress, sleep, exercise, food, hydration, pre-existing medical conditions, and more. It is recommended to use the knowledge from this video to become more involved with your lab results and evaluate your numbers to speak with your doctor.   Diseases of Our Time - Overview  Clinical staff conducted group or individual video education with verbal and written material and guidebook.  Patient learns that according to the CDC, 50% to 70% of chronic diseases (such as obesity, type 2 diabetes, elevated lipids, hypertension, and heart disease) are  avoidable through lifestyle improvements including healthier food choices, listening to satiety cues, and increased physical activity.  Sleep Disorders Clinical staff conducted group or individual video education with verbal and written material and guidebook.  Patient learns how good quality and duration of sleep are important to overall health and well-being. Patient also learns about sleep disorders and how they impact health along with recommendations to address them, including discussing with a physician.  Nutrition  Dining Out - Part 2 Clinical staff conducted group or individual video education with verbal and written material and guidebook.  Patient learns how to plan ahead and communicate in order to maximize their dining experience in a healthy and nutritious manner. Included are recommended food choices based on the type of restaurant the patient is visiting.   Fueling a Banker conducted group or individual video education with verbal and written material and guidebook.  There is a strong connection between our food choices and our health. Diseases like obesity and type 2 diabetes are very prevalent and are in large-part due to lifestyle choices. The Pritikin Eating Plan provides plenty of food and hunger-curbing satisfaction. It is easy to follow, affordable, and helps reduce health risks.  Menu Workshop  Clinical staff conducted group or individual video education with verbal and written material and guidebook.  Patient learns that restaurant meals can sabotage health goals because they are often packed with calories, fat, sodium, and sugar. Recommendations include strategies to plan ahead and to communicate with the manager, chef, or server to help order a healthier meal.  Planning Your Eating Strategy  Clinical staff conducted group or individual video education with verbal and written material and guidebook.  Patient  learns about the Pritikin Eating Plan and  its benefit of reducing the risk of disease. The Pritikin Eating Plan does not focus on calories. Instead, it emphasizes high-quality, nutrient-rich foods. By knowing the characteristics of the foods, we choose, we can determine their calorie density and make informed decisions.  Targeting Your Nutrition Priorities  Clinical staff conducted group or individual video education with verbal and written material and guidebook.  Patient learns that lifestyle habits have a tremendous impact on disease risk and progression. This video provides eating and physical activity recommendations based on your personal health goals, such as reducing LDL cholesterol, losing weight, preventing or controlling type 2 diabetes, and reducing high blood pressure.  Vitamins and Minerals  Clinical staff conducted group or individual video education with verbal and written material and guidebook.  Patient learns different ways to obtain key vitamins and minerals, including through a recommended healthy diet. It is important to discuss all supplements you take with your doctor.   Healthy Mind-Set    Smoking Cessation  Clinical staff conducted group or individual video education with verbal and written material and guidebook.  Patient learns that cigarette smoking and tobacco addiction pose a serious health risk which affects millions of people. Stopping smoking will significantly reduce the risk of heart disease, lung disease, and many forms of cancer. Recommended strategies for quitting are covered, including working with your doctor to develop a successful plan.  Culinary   Becoming a Set designer conducted group or individual video education with verbal and written material and guidebook.  Patient learns that cooking at home can be healthy, cost-effective, quick, and puts them in control. Keys to cooking healthy recipes will include looking at your recipe, assessing your equipment needs, planning ahead,  making it simple, choosing cost-effective seasonal ingredients, and limiting the use of added fats, salts, and sugars.  Cooking - Breakfast and Snacks  Clinical staff conducted group or individual video education with verbal and written material and guidebook.  Patient learns how important breakfast is to satiety and nutrition through the entire day. Recommendations include key foods to eat during breakfast to help stabilize blood sugar levels and to prevent overeating at meals later in the day. Planning ahead is also a key component.  Cooking - Educational psychologist conducted group or individual video education with verbal and written material and guidebook.  Patient learns eating strategies to improve overall health, including an approach to cook more at home. Recommendations include thinking of animal protein as a side on your plate rather than center stage and focusing instead on lower calorie dense options like vegetables, fruits, whole grains, and plant-based proteins, such as beans. Making sauces in large quantities to freeze for later and leaving the skin on your vegetables are also recommended to maximize your experience.  Cooking - Healthy Salads and Dressing Clinical staff conducted group or individual video education with verbal and written material and guidebook.  Patient learns that vegetables, fruits, whole grains, and legumes are the foundations of the Pritikin Eating Plan. Recommendations include how to incorporate each of these in flavorful and healthy salads, and how to create homemade salad dressings. Proper handling of ingredients is also covered. Cooking - Soups and [employer] - Soups and Desserts Clinical staff conducted group or individual video education with verbal and written material and guidebook.  Patient learns that Pritikin soups and desserts make for easy, nutritious, and delicious snacks and meal components that are low in  sodium, fat, sugar, and  calorie density, while high in vitamins, minerals, and filling fiber. Recommendations include simple and healthy ideas for soups and desserts.   Overview     The Pritikin Solution Program Overview Clinical staff conducted group or individual video education with verbal and written material and guidebook.  Patient learns that the results of the Pritikin Program have been documented in more than 100 articles published in peer-reviewed journals, and the benefits include reducing risk factors for (and, in some cases, even reversing) high cholesterol, high blood pressure, type 2 diabetes, obesity, and more! An overview of the three key pillars of the Pritikin Program will be covered: eating well, doing regular exercise, and having a healthy mind-set.  WORKSHOPS  Exercise: Exercise Basics: Building Your Action Plan Clinical staff led group instruction and group discussion with PowerPoint presentation and patient guidebook. To enhance the learning environment the use of posters, models and videos may be added. At the conclusion of this workshop, patients will comprehend the difference between physical activity and exercise, as well as the benefits of incorporating both, into their routine. Patients will understand the FITT (Frequency, Intensity, Time, and Type) principle and how to use it to build an exercise action plan. In addition, safety concerns and other considerations for exercise and cardiac rehab will be addressed by the presenter. The purpose of this lesson is to promote a comprehensive and effective weekly exercise routine in order to improve patients' overall level of fitness.   Managing Heart Disease: Your Path to a Healthier Heart Clinical staff led group instruction and group discussion with PowerPoint presentation and patient guidebook. To enhance the learning environment the use of posters, models and videos may be added.At the conclusion of this workshop, patients will understand the  anatomy and physiology of the heart. Additionally, they will understand how Pritikin's three pillars impact the risk factors, the progression, and the management of heart disease.  The purpose of this lesson is to provide a high-level overview of the heart, heart disease, and how the Pritikin lifestyle positively impacts risk factors.  Exercise Biomechanics Clinical staff led group instruction and group discussion with PowerPoint presentation and patient guidebook. To enhance the learning environment the use of posters, models and videos may be added. Patients will learn how the structural parts of their bodies function and how these functions impact their daily activities, movement, and exercise. Patients will learn how to promote a neutral spine, learn how to manage pain, and identify ways to improve their physical movement in order to promote healthy living. The purpose of this lesson is to expose patients to common physical limitations that impact physical activity. Participants will learn practical ways to adapt and manage aches and pains, and to minimize their effect on regular exercise. Patients will learn how to maintain good posture while sitting, walking, and lifting.  Balance Training and Fall Prevention  Clinical staff led group instruction and group discussion with PowerPoint presentation and patient guidebook. To enhance the learning environment the use of posters, models and videos may be added. At the conclusion of this workshop, patients will understand the importance of their sensorimotor skills (vision, proprioception, and the vestibular system) in maintaining their ability to balance as they age. Patients will apply a variety of balancing exercises that are appropriate for their current level of function. Patients will understand the common causes for poor balance, possible solutions to these problems, and ways to modify their physical environment in order to minimize their  fall risk. The  purpose of this lesson is to teach patients about the importance of maintaining balance as they age and ways to minimize their risk of falling.  WORKSHOPS   Nutrition:  Fueling a Ship broker led group instruction and group discussion with PowerPoint presentation and patient guidebook. To enhance the learning environment the use of posters, models and videos may be added. Patients will review the foundational principles of the Pritikin Eating Plan and understand what constitutes a serving size in each of the food groups. Patients will also learn Pritikin-friendly foods that are better choices when away from home and review make-ahead meal and snack options. Calorie density will be reviewed and applied to three nutrition priorities: weight maintenance, weight loss, and weight gain. The purpose of this lesson is to reinforce (in a group setting) the key concepts around what patients are recommended to eat and how to apply these guidelines when away from home by planning and selecting Pritikin-friendly options. Patients will understand how calorie density may be adjusted for different weight management goals.  Mindful Eating  Clinical staff led group instruction and group discussion with PowerPoint presentation and patient guidebook. To enhance the learning environment the use of posters, models and videos may be added. Patients will briefly review the concepts of the Pritikin Eating Plan and the importance of low-calorie dense foods. The concept of mindful eating will be introduced as well as the importance of paying attention to internal hunger signals. Triggers for non-hunger eating and techniques for dealing with triggers will be explored. The purpose of this lesson is to provide patients with the opportunity to review the basic principles of the Pritikin Eating Plan, discuss the value of eating mindfully and how to measure internal cues of hunger and fullness using the  Hunger Scale. Patients will also discuss reasons for non-hunger eating and learn strategies to use for controlling emotional eating.  Targeting Your Nutrition Priorities Clinical staff led group instruction and group discussion with PowerPoint presentation and patient guidebook. To enhance the learning environment the use of posters, models and videos may be added. Patients will learn how to determine their genetic susceptibility to disease by reviewing their family history. Patients will gain insight into the importance of diet as part of an overall healthy lifestyle in mitigating the impact of genetics and other environmental insults. The purpose of this lesson is to provide patients with the opportunity to assess their personal nutrition priorities by looking at their family history, their own health history and current risk factors. Patients will also be able to discuss ways of prioritizing and modifying the Pritikin Eating Plan for their highest risk areas  Menu  Clinical staff led group instruction and group discussion with PowerPoint presentation and patient guidebook. To enhance the learning environment the use of posters, models and videos may be added. Using menus brought in from [employer], or printed from [employer], patients will apply the Pritikin dining out guidelines that were presented in the [employer] video. Patients will also be able to practice these guidelines in a variety of provided scenarios. The purpose of this lesson is to provide patients with the opportunity to practice hands-on learning of the Pritikin Dining Out guidelines with actual menus and practice scenarios.  Label Reading Clinical staff led group instruction and group discussion with PowerPoint presentation and patient guidebook. To enhance the learning environment the use of posters, models and videos may be added. Patients will review and discuss the Pritikin label reading guidelines  presented in Pritikin's Label Reading Educational series video. Using fool labels brought in from local grocery stores and markets, patients will apply the label reading guidelines and determine if the packaged food meet the Pritikin guidelines. The purpose of this lesson is to provide patients with the opportunity to review, discuss, and practice hands-on learning of the Pritikin Label Reading guidelines with actual packaged food labels. Cooking School  Pritikin's [employer] are designed to teach patients ways to prepare quick, simple, and affordable recipes at home. The importance of nutrition's role in chronic disease risk reduction is reflected in its emphasis in the overall Pritikin program. By learning how to prepare essential core Pritikin Eating Plan recipes, patients will increase control over what they eat; be able to customize the flavor of foods without the use of added salt, sugar, or fat; and improve the quality of the food they consume. By learning a set of core recipes which are easily assembled, quickly prepared, and affordable, patients are more likely to prepare more healthy foods at home. These workshops focus on convenient breakfasts, simple entres, side dishes, and desserts which can be prepared with minimal effort and are consistent with nutrition recommendations for cardiovascular risk reduction. Cooking [employer] are taught by a Armed forces logistics/support/administrative officer (RD) who has been trained by the [employer]. The chef or RD has a clear understanding of the importance of minimizing - if not completely eliminating - added fat, sugar, and sodium in recipes. Throughout the series of Cooking School Workshop sessions, patients will learn about healthy ingredients and efficient methods of cooking to build confidence in their capability to prepare    Cooking School weekly topics:  Adding Flavor- Sodium-Free  Fast and Healthy Breakfasts  Powerhouse Plant-Based  Proteins  Satisfying Salads and Dressings  Simple Sides and Sauces  International Cuisine-Spotlight on the [employer] Zones  Delicious Desserts  Savory Soups  [employer] - Meals in a Astronomer Appetizers and Snacks  Comforting Weekend Breakfasts  One-Pot Wonders   Fast Evening Meals  Landscape architect Your Pritikin Plate  WORKSHOPS   Healthy Mindset (Psychosocial):  Focused Goals, Sustainable Changes Clinical staff led group instruction and group discussion with PowerPoint presentation and patient guidebook. To enhance the learning environment the use of posters, models and videos may be added. Patients will be able to apply effective goal setting strategies to establish at least one personal goal, and then take consistent, meaningful action toward that goal. They will learn to identify common barriers to achieving personal goals and develop strategies to overcome them. Patients will also gain an understanding of how our mind-set can impact our ability to achieve goals and the importance of cultivating a positive and growth-oriented mind-set. The purpose of this lesson is to provide patients with a deeper understanding of how to set and achieve personal goals, as well as the tools and strategies needed to overcome common obstacles which may arise along the way.  From Head to Heart: The Power of a Healthy Outlook  Clinical staff led group instruction and group discussion with PowerPoint presentation and patient guidebook. To enhance the learning environment the use of posters, models and videos may be added. Patients will be able to recognize and describe the impact of emotions and mood on physical health. They will discover the importance of self-care and explore self-care practices which may work for them. Patients will also learn how to utilize the 4 C's to cultivate a healthier outlook and  better manage stress and challenges. The purpose of this lesson is to demonstrate  to patients how a healthy outlook is an essential part of maintaining good health, especially as they continue their cardiac rehab journey.  Healthy Sleep for a Healthy Heart Clinical staff led group instruction and group discussion with PowerPoint presentation and patient guidebook. To enhance the learning environment the use of posters, models and videos may be added. At the conclusion of this workshop, patients will be able to demonstrate knowledge of the importance of sleep to overall health, well-being, and quality of life. They will understand the symptoms of, and treatments for, common sleep disorders. Patients will also be able to identify daytime and nighttime behaviors which impact sleep, and they will be able to apply these tools to help manage sleep-related challenges. The purpose of this lesson is to provide patients with a general overview of sleep and outline the importance of quality sleep. Patients will learn about a few of the most common sleep disorders. Patients will also be introduced to the concept of "sleep hygiene," and discover ways to self-manage certain sleeping problems through simple daily behavior changes. Finally, the workshop will motivate patients by clarifying the links between quality sleep and their goals of heart-healthy living.   Recognizing and Reducing Stress Clinical staff led group instruction and group discussion with PowerPoint presentation and patient guidebook. To enhance the learning environment the use of posters, models and videos may be added. At the conclusion of this workshop, patients will be able to understand the types of stress reactions, differentiate between acute and chronic stress, and recognize the impact that chronic stress has on their health. They will also be able to apply different coping mechanisms, such as reframing negative self-talk. Patients will have the opportunity to practice a variety of stress management techniques, such as deep  abdominal breathing, progressive muscle relaxation, and/or guided imagery.  The purpose of this lesson is to educate patients on the role of stress in their lives and to provide healthy techniques for coping with it.  Learning Barriers/Preferences:  Learning Barriers/Preferences - 04/03/23 1213       Learning Barriers/Preferences   Learning Barriers Exercise Concerns;Hearing   HOH and balance concerns   Learning Preferences Computer/Internet;Group Instruction;Individual Instruction;Skilled Demonstration;Video;Written Material;Pictoral             Education Topics:  Knowledge Questionnaire Score:  Knowledge Questionnaire Score - 04/03/23 1214       Knowledge Questionnaire Score   Pre Score 21/24             Core Components/Risk Factors/Patient Goals at Admission:  Personal Goals and Risk Factors at Admission - 04/03/23 1216       Core Components/Risk Factors/Patient Goals on Admission    Weight Management Yes;Weight Loss    Intervention Weight Management: Develop a combined nutrition and exercise program designed to reach desired caloric intake, while maintaining appropriate intake of nutrient and fiber, sodium and fats, and appropriate energy expenditure required for the weight goal.;Weight Management: Provide education and appropriate resources to help participant work on and attain dietary goals.;Weight Management/Obesity: Establish reasonable short term and long term weight goals.;Obesity: Provide education and appropriate resources to help participant work on and attain dietary goals.    Admit Weight 154 lb 8.7 oz (70.1 kg)    Hypertension Yes    Intervention Provide education on lifestyle modifcations including regular physical activity/exercise, weight management, moderate sodium restriction and increased consumption of fresh fruit, vegetables, and low fat dairy, alcohol  moderation, and smoking cessation.;Monitor prescription use compliance.    Expected Outcomes Short  Term: Continued assessment and intervention until BP is < 140/3mm HG in hypertensive participants. < 130/39mm HG in hypertensive participants with diabetes, heart failure or chronic kidney disease.;Long Term: Maintenance of blood pressure at goal levels.    Lipids Yes    Intervention Provide education and support for participant on nutrition & aerobic/resistive exercise along with prescribed medications to achieve LDL 70mg , HDL >40mg .    Expected Outcomes Short Term: Participant states understanding of desired cholesterol values and is compliant with medications prescribed. Participant is following exercise prescription and nutrition guidelines.;Long Term: Cholesterol controlled with medications as prescribed, with individualized exercise RX and with personalized nutrition plan. Value goals: LDL < 70mg , HDL > 40 mg.    Stress Yes    Intervention Offer individual and/or small group education and counseling on adjustment to heart disease, stress management and health-related lifestyle change. Teach and support self-help strategies.;Refer participants experiencing significant psychosocial distress to appropriate mental health specialists for further evaluation and treatment. When possible, include family members and significant others in education/counseling sessions.    Expected Outcomes Short Term: Participant demonstrates changes in health-related behavior, relaxation and other stress management skills, ability to obtain effective social support, and compliance with psychotropic medications if prescribed.;Long Term: Emotional wellbeing is indicated by absence of clinically significant psychosocial distress or social isolation.    Personal Goal Other Yes    Personal Goal short term: balance training, know limits to exercise Long term: tuned up to keep up with grandkids, 14 min paced mile    Intervention Will continue to monitor pt and progress workloads as tolerated without sign or symptom    Expected  Outcomes Pt will achive her goals and gain strength             Core Components/Risk Factors/Patient Goals Review:   Goals and Risk Factor Review     Row Name 04/10/23 0759 04/22/23 1432           Core Components/Risk Factors/Patient Goals Review   Personal Goals Review Weight Management/Obesity;Hypertension;Lipids;Stress Weight Management/Obesity;Hypertension;Lipids;Stress      Review Shelby Vazquez started intensive cardiac rehab on 04/09/23 and did well with exercise. Spot checked CBG as Shelby Vazquez is a diet controlled prediabetic. Vital signs and CBG's were stable Shelby Vazquez is doing  well with exercise at cardiac rehab.. Vital signs have been stable. Shelby Vazquez is enjoying participating in cardiac rehab. Shelby Vazquez says coming to cardiac rehab his her "happy place"      Expected Outcomes Shelby Vazquez will continue to participate in intensive cardiac rehab for exercise, nutrition and lifestyle modifications Shelby Vazquez will continue to participate in intensive cardiac rehab for exercise, nutrition and lifestyle modifications               Core Components/Risk Factors/Patient Goals at Discharge (Final Review):   Goals and Risk Factor Review - 04/22/23 1432       Core Components/Risk Factors/Patient Goals Review   Personal Goals Review Weight Management/Obesity;Hypertension;Lipids;Stress    Review Shelby Vazquez is doing  well with exercise at cardiac rehab.. Vital signs have been stable. Shelby Vazquez is enjoying participating in cardiac rehab. Shelby Vazquez says coming to cardiac rehab his her "happy place"    Expected Outcomes Shelby Vazquez will continue to participate in intensive cardiac rehab for exercise, nutrition and lifestyle modifications             ITP Comments:  ITP Comments     Row Name 04/03/23 0916 04/10/23 0749 04/22/23 1421  ITP Comments Armanda Magic, MD: Medical Director.  Introduction to the Pritikin Education program / Intensive Cardiac Rehab.  Initial orientation packet reviewed with the patient. 30 Day ITP Review. Shelby Vazquez started  intensive cardiac rehab on 04/09/23 and did well with exercise 30 Day ITP Review. Shelby Vazquez has good attendance and participation in intensive cardiac rehab exercise and is off to a good start to exercoise              Comments: ***

## 2023-05-20 NOTE — Progress Notes (Signed)
Cardiac Individual Treatment Plan  Patient Details  Name: Shelby Vazquez MRN: [id number] Date of Birth: 04/03/1951 Referring Provider:   Flowsheet Row INTENSIVE CARDIAC REHAB ORIENT from 04/03/2023 in [facility], V[facility], & [facility] Health  Referring Provider Dr. Bryan Lemma MD       Initial Encounter Date:  Flowsheet Row INTENSIVE CARDIAC REHAB ORIENT from 04/03/2023 in [facility] for Heart, Vascular, & Lung Health  Date 04/03/23       Visit Diagnosis: 03/05/23 NSTEMI  03/05/23 Takotsubo cardiomyopathy  Patient's Home Medications on Admission:  Current Outpatient Medications:    acetaminophen (TYLENOL) 500 MG tablet, Take 500 mg by mouth every 6 (six) hours as needed., Disp: , Rfl:    aspirin 81 MG tablet, Take 1 tablet (81 mg total) by mouth daily., Disp: 30 tablet, Rfl:    Calcium Carbonate-Vitamin D 600-200 MG-UNIT CAPS, Take 1 capsule by mouth daily., Disp: , Rfl:    clidinium-chlordiazePOXIDE (LIBRAX) 5-2.5 MG capsule, Take 1 capsule by mouth 2 (two) times daily at 8 am and 10 pm., Disp: 180 capsule, Rfl: 3   Estradiol-Estriol-Progesterone (BIEST/PROGESTERONE TD), Place 2 mLs onto the skin in the morning and at bedtime. Apply to inner thighs or arms, Disp: , Rfl:    famotidine (PEPCID) 40 MG tablet, Take 1 tablet (40 mg total) by mouth daily at bedtime., Disp: 90 tablet, Rfl: 3   loratadine (CLARITIN) 10 MG tablet, Take 10 mg by mouth daily., Disp: , Rfl:    methenamine (HIPREX) 1 g tablet, Take 1 tablet (1 g total) by mouth 2 (two) times daily., Disp: 180 tablet, Rfl: 3   methenamine (HIPREX) 1 g tablet, 1 gm daily, Disp: , Rfl:    metoprolol succinate (TOPROL-XL) 100 MG 24 hr tablet, Take 1 tablet (100 mg total) by mouth daily. Take with or immediately following a meal., Disp: 90 tablet, Rfl: 3   ondansetron (ZOFRAN-ODT) 4 MG disintegrating tablet, DISSOLVE 1 TABLET(4 MG) ON THE TONGUE EVERY 8 HOURS AS NEEDED FOR  NAUSEA OR VOMITING, Disp: 30 tablet, Rfl: 00   Probiotic Product (PROBIOTIC PO), Take by mouth daily. 900 billion, Disp: , Rfl:    RABEprazole (ACIPHEX) 20 MG tablet, Take 1 tablet (20 mg total) by mouth daily., Disp: 90 tablet, Rfl: 3   rosuvastatin (CRESTOR) 20 MG tablet, Take 1 tablet (20 mg total) by mouth every evening., Disp: 90 tablet, Rfl: 3   sacubitril-valsartan (ENTRESTO) 24-26 MG, Take 1 tablet by mouth 2 (two) times daily., Disp: 180 tablet, Rfl: 3   spironolactone (ALDACTONE) 25 MG tablet, Take 0.5 tablets (12.5 mg total) by mouth daily., Disp: 45 tablet, Rfl: 3   Vitamin B Complex-C CAPS, Take 1 capsule by mouth daily., Disp: , Rfl:    Wheat Dextrin (BENEFIBER DRINK MIX PO), Take by mouth., Disp: , Rfl:   Past Medical History: Past Medical History:  Diagnosis Date   Abdominal aortic atherosclerosis (HCC) 10/2014   by CT scan   B12 deficiency    Chronic anxiety    Chronic cough    cyclical cough syndrome   Complication of anesthesia 9 yrs ago   awake and in severe pain with colonoscopy   Concussion    Diet-controlled diabetes mellitus (HCC) 02/22/2017   New dx 02/2017   GERD (gastroesophageal reflux disease)    severe leading to chronic cough and hoarseness   History of Epstein-Barr virus infection 2012   Hyperlipidemia    Hypertension  IBS (irritable bowel syndrome)    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 - did not require surgery    Tobacco Use: Social History   Tobacco Use  Smoking Status Never  Smokeless Tobacco Never     Labs: Review Flowsheet  More data exists      Latest Ref Rng & Units 04/17/2021 03/19/2022 07/03/2022 11/08/2022 03/06/2023  Labs for ITP Cardiac and Pulmonary Rehab  Cholestrol 0 - 200 mg/dL 875  - 643  - 329   LDL (calc) 0 - 99 mg/dL 64  - 53  - 71   Direct LDL 0 - 99 mg/dL - 69  - - -  HDL-C >51 mg/dL 88.41  - 66.06  - 37   Trlycerides <150 mg/dL 301.6  - 010.9  - 88   Hemoglobin A1c 4.8 - 5.6 % 6.6  - 6.4  6.1  6.2     Details            Capillary Blood Glucose: Lab Results  Component Value Date   GLUCAP 81 04/09/2023   GLUCAP 125 (H) 04/09/2023   GLUCAP 136 (H) 07/20/2017   GLUCAP 116 (H) 07/20/2017   GLUCAP 108 (H) 07/19/2017     Exercise Target Goals: Exercise Program Goal: Individual exercise prescription set using results from initial 6 min walk test and THRR while considering  patient's activity barriers and safety.   Exercise Prescription Goal: Initial exercise prescription builds to 30-45 minutes a day of aerobic activity, 2-3 days per week.  Home exercise guidelines will be given to patient during program as part of exercise prescription that the participant will acknowledge.  Activity Barriers & Risk Stratification:  Activity Barriers & Cardiac Risk Stratification - 04/03/23 1205       Activity Barriers & Cardiac Risk Stratification   Activity Barriers Deconditioning;Balance Concerns;Shortness of Breath    Cardiac Risk Stratification High             6 Minute Walk:  6 Minute Walk     Row Name 04/03/23 1146         6 Minute Walk   Phase Initial     Distance 1731 feet     Walk Time 6 minutes     # of Rest Breaks 0     MPH 3.28     METS 3.69     RPE 11     Perceived Dyspnea  1     VO2 Peak 12.91     Symptoms Yes (comment)     Comments Pre walk test patient SOBx1 SaO2 98% possible anxiety. Nurse aware, lung sounds clear. Resolved with rest. Patient felt good on the walk test, SOB x1 post, resolved with rest     Resting HR 64 bpm      Resting BP 112/70     Resting Oxygen Saturation  98 %     Exercise Oxygen Saturation  during 6 min walk 98 %     Max Ex. HR 97 bpm     Max  Ex. BP 156/62     2 Minute Post BP 134/68              Oxygen Initial Assessment:   Oxygen Re-Evaluation:   Oxygen Discharge (Final Oxygen Re-Evaluation):   Initial Exercise Prescription:  Initial Exercise Prescription - 04/03/23 1200       Date of Initial Exercise RX and Referring Provider   Date 04/03/23    Referring Provider Dr. Bryan Lemma MD    Expected Discharge Date 06/13/23      NuStep   Level 2    SPM 80    Minutes 15    METs 2.2      Track   Laps 22    Minutes 15    METs 2.9      Prescription Details   Frequency (times per week) 3    Duration Progress to 30 minutes of continuous aerobic without signs/symptoms of physical distress      Intensity   THRR 40-80% of Max Heartrate 59-118    Ratings of Perceived Exertion 11-13    Perceived Dyspnea 0-4      Progression   Progression Continue progressive overload as per policy without signs/symptoms or physical distress.      Resistance Training   Training Prescription Yes    Weight 2    Reps 10-15             Perform Capillary Blood Glucose checks as needed.  Exercise Prescription Changes:   Exercise Prescription Changes     Row Name 04/09/23 1024 04/21/23 1024 05/05/23 1020 05/19/23 1025       Response to Exercise   Blood Pressure (Admit) 126/74 128/72 104/60 124/78    Blood Pressure (Exercise) 142/70 120/68 122/68 142/62    Blood Pressure (Exit) 112/74 112/72 118/80 110/68    Heart Rate (Admit) 79 bpm 89 bpm 70 bpm 75 bpm    Heart Rate (Exercise) 105 bpm 106 bpm 120 bpm 114 bpm    Heart Rate (Exit) 76 bpm 75 bpm 72 bpm 84 bpm    Rating of Perceived Exertion (Exercise) 11 11 11 11     Symptoms None None None None    Comments Off to a good start with exercise. Reviewed METs -- Reviewed METs    Duration Continue with 30 min of aerobic exercise  without signs/symptoms of physical distress. Continue with 30 min of aerobic exercise without signs/symptoms of physical distress. Continue with 30 min of aerobic exercise without signs/symptoms of physical distress. Continue with 30 min of aerobic exercise without signs/symptoms of physical distress.    Intensity THRR unchanged THRR unchanged THRR unchanged THRR unchanged      Progression   Progression Continue to progress workloads to maintain intensity without signs/symptoms of physical distress. Continue to progress workloads to maintain intensity without signs/symptoms of physical distress. Continue to progress workloads to maintain intensity without signs/symptoms of physical distress. Continue to progress workloads to maintain intensity without signs/symptoms of physical distress.    Average METs 2.7 3.2 3 3.4      Resistance Training   Training Prescription No  Relaxation day, no weights No  Relaxation day, no weights Yes Yes    Weight -- -- 2 lbs 2 lbs    Reps -- -- 10-15 10-15    Time -- -- 10 Minutes 10 Minutes      Interval Training   Interval Training No No No No      NuStep   Level 2 2 2  3  SPM 71 102 109 115    Minutes 15 15 15 15     METs 2.2 2.8 2.7 3.2      Track   Laps 18 21 18 21     Minutes 15 15 15 15     METs 3.3 3.68 3.3 3.68      Home Exercise Plan   Plans to continue exercise at -- -- Home (comment)  Walking Home (comment)  Walking    Frequency -- -- Add 4 additional days to program exercise sessions. Add 4 additional days to program exercise sessions.    Initial Home Exercises Provided -- -- 04/23/23 04/23/23             Exercise Comments:   Exercise Comments     Row Name 04/09/23 1129 04/21/23 1111 04/23/23 1059 05/07/23 1100 05/19/23 1111   Exercise Comments Shelby Vazquez tolerated 1st session of exercise well without symptoms. Oriented to warm-up/cool-down stretching routine. Reviewed METs with patient. Reviewed home exercise guidelines and goals with  patient. Reviewed METs and goals with Shelby Vazquez. Reviewed METs with Shelby Vazquez.            Exercise Goals and Review:   Exercise Goals     Row Name 04/03/23 1208             Exercise Goals   Increase Physical Activity Yes       Intervention Provide advice, education, support and counseling about physical activity/exercise needs.;Develop an individualized exercise prescription for aerobic and resistive training based on initial evaluation findings, risk stratification, comorbidities and participant's personal goals.       Expected Outcomes Short Term: Attend rehab on a regular basis to increase amount of physical activity.;Long Term: Exercising regularly at least 3-5 days a week.;Long Term: Add in home exercise to make exercise part of routine and to increase amount of physical activity.       Increase Strength and Stamina Yes       Intervention Provide advice, education, support and counseling about physical activity/exercise needs.;Develop an individualized exercise prescription for aerobic and resistive training based on initial evaluation findings, risk stratification, comorbidities and participant's personal goals.       Expected Outcomes Short Term: Increase workloads from initial exercise prescription for resistance, speed, and METs.;Short Term: Perform resistance training exercises routinely during rehab and add in resistance training at home;Long Term: Improve cardiorespiratory fitness, muscular endurance and strength as measured by increased METs and functional capacity ( )       Able to understand and use rate of perceived exertion (RPE) scale Yes       Intervention Provide education and explanation on how to use RPE scale       Expected Outcomes Short Term: Able to use RPE daily in rehab to express subjective intensity level;Long Term:  Able to use RPE to guide intensity level when exercising independently       Knowledge and understanding of Target Heart Rate Range (THRR) Yes        Intervention Provide education and explanation of THRR including how the numbers were predicted and where they are located for reference       Expected Outcomes Short Term: Able to state/look up THRR;Long Term: Able to use THRR to govern intensity when exercising independently;Short Term: Able to use daily as guideline for intensity in rehab       Understanding of Exercise Prescription Yes       Intervention Provide education, explanation, and written materials on patient's individual exercise prescription  Expected Outcomes Short Term: Able to explain program exercise prescription;Long Term: Able to explain home exercise prescription to exercise independently                Exercise Goals Re-Evaluation :  Exercise Goals Re-Evaluation     Row Name 04/09/23 1129 04/23/23 1059 05/07/23 1100         Exercise Goal Re-Evaluation   Exercise Goals Review Increase Physical Activity;Increase Strength and Stamina;Able to understand and use rate of perceived exertion (RPE) scale Increase Physical Activity;Increase Strength and Stamina;Able to understand and use rate of perceived exertion (RPE) scale;Able to check pulse independently;Knowledge and understanding of Target Heart Rate Range (THRR);Understanding of Exercise Prescription Increase Physical Activity;Increase Strength and Stamina;Able to understand and use rate of perceived exertion (RPE) scale;Able to check pulse independently;Knowledge and understanding of Target Heart Rate Range (THRR);Understanding of Exercise Prescription     Comments Shelby Vazquez was able to understand and use RPE scale appropriately. Reviewed exercise prescription with Shelby Vazquez. She is walking and occassionally experiences chest discomfort afterwards. Discussed decreasing duration from 18 minutes BID to 15 minutes once or twice/day depending on how she's feeling. She has a smart watch that may be able to check her pulse. Shelby Vazquez states she's stretching, walking outdoors, and light  laps/walking in the pool after walk. Given stretching hand out with the warm-up and cool-down stretches from cardiac rehab.     Expected Outcomes Progress workloads as tolerated to help achieve personal health and fitness goals. Shelby Vazquez will walk 15 minutes 1-2 x's/day 2-4 days/week as tolerated in addition to exercise at cardiac rehab to achieve 150 minutes aerobic exercise/week. Continue home exercise routine in addition to exercise at cardiac rehab.              Discharge Exercise Prescription (Final Exercise Prescription Changes):  Exercise Prescription Changes - 05/19/23 1025       Response to Exercise   Blood Pressure (Admit) 124/78    Blood Pressure (Exercise) 142/62    Blood Pressure (Exit) 110/68    Heart Rate (Admit) 75 bpm    Heart Rate (Exercise) 114 bpm    Heart Rate (Exit) 84 bpm    Rating of Perceived Exertion (Exercise) 11    Symptoms None    Comments Reviewed METs    Duration Continue with 30 min of aerobic exercise without signs/symptoms of physical distress.    Intensity THRR unchanged      Progression   Progression Continue to progress workloads to maintain intensity without signs/symptoms of physical distress.    Average METs 3.4      Resistance Training   Training Prescription Yes    Weight 2 lbs    Reps 10-15    Time 10 Minutes      Interval Training   Interval Training No      NuStep   Level 3    SPM 115    Minutes 15    METs 3.2      Track   Laps 21    Minutes 15    METs 3.68      Home Exercise Plan   Plans to continue exercise at Home (comment)   Walking   Frequency Add 4 additional days to program exercise sessions.    Initial Home Exercises Provided 04/23/23             Nutrition:  Target Goals: Understanding of nutrition guidelines, daily intake of sodium 1500mg , cholesterol 200mg , calories 30% from fat and 7% or less from  saturated fats, daily to have 5 or more servings of fruits and vegetables.  Biometrics:  Pre Biometrics  - 04/03/23 1144       Pre Biometrics   Waist Circumference 37 inches    Hip Circumference 40.5 inches    Waist to Hip Ratio 0.91 %    Triceps Skinfold 19 mm    % Body Fat 37.3 %    Grip Strength 24 kg    Flexibility 12.25 in    Single Leg Stand 4.5 seconds              Nutrition Therapy Plan and Nutrition Goals:  Nutrition Therapy & Goals - 05/09/23 1029       Nutrition Therapy   Diet Heart Healthy Diet    Drug/Food Interactions Statins/Certain Fruits      Personal Nutrition Goals   Nutrition Goal Patient to improve diet quality by using the plate method as a guide for meal planning to include lean protein/plant protein, fruits, vegetables, whole grains, nonfat dairy as part of a well-balanced diet.   goal in action.   Personal Goal #2 Patient to identify strategies for reducing cardiovascular risk by attending the [facility] education and nutrition series weekly.   goal in action.   Personal Goal #3 Patient to reduce sodium to 1500mg  per day   goal in action.   Comments Goals in action. Shelby Vazquez continues to attend the [facility] education and nutrition series. Shelby Vazquez reports making many changes including reduced sodium intake and decreased saturated fat/meat intake. She has maintained her weight since starting with our program. Patient will benefit from participation in intensive cardiac rehab for nutrition, exercise, and lifestyle modification.      Intervention Plan   Intervention Prescribe, educate and counsel regarding individualized specific dietary modifications aiming towards targeted core components such as weight, hypertension, lipid management, diabetes, heart failure and other comorbidities.;Nutrition handout(s) given to patient.    Expected Outcomes Short Term Goal: Understand basic principles of dietary content, such as calories, fat, sodium, cholesterol and nutrients.;Long Term Goal: Adherence to prescribed nutrition plan.             Nutrition Assessments:   Nutrition Assessments - 04/14/23 1006       Rate Your Plate Scores   Pre Score 58            MEDIFICTS Score Key: ?70 Need to make dietary changes  40-70 Heart Healthy Diet ? 40 Therapeutic Level Cholesterol Diet   Flowsheet Row INTENSIVE CARDIAC REHAB from 04/11/2023 in [facility] Heart, Vascular, & Lung Health  Picture Your Plate Total Score on Admission 58      Picture Your Plate Scores: <40 Unhealthy dietary pattern with much room for improvement. 41-50 Dietary pattern unlikely to meet recommendations for good health and room for improvement. 51-60 More healthful dietary pattern, with some room for improvement.  >60 Healthy dietary pattern, although there may be some specific behaviors that could be improved.    Nutrition Goals Re-Evaluation:  Nutrition Goals Re-Evaluation     Row Name 04/11/23 1130 05/09/23 1029           Goals   Current Weight 154 lb 12.2 oz (70.2 kg) 154 lb 5.2 oz (70 kg)      Comment A1c 6.2, lipids WNL-HDL 37, TSH 4.87, lipoprotein A WNL no new labs; most recent labs A1c 6.2, lipids WNL-HDL 37, TSH 4.87, lipoprotein A WNL      Expected Outcome Shelby Vazquez reports making many  changes including reduced sodium intake and decreased saturated fat/meat intake. Patient will benefit from participation in intensive cardiac rehab for nutrition, exercise, and lifestyle modification. Goals in action. Shelby Vazquez continues to attend the [facility] education and nutrition series. Shelby Vazquez reports making many changes including reduced sodium intake and decreased saturated fat/meat intake. She has maintained her weight since starting with our program. Patient will benefit from participation in intensive cardiac rehab for nutrition, exercise, and lifestyle modification.               Nutrition Goals Re-Evaluation:  Nutrition Goals Re-Evaluation     Row Name 04/11/23 1130 05/09/23 1029           Goals   Current Weight 154 lb 12.2 oz (70.2 kg) 154  lb 5.2 oz (70 kg)      Comment A1c 6.2, lipids WNL-HDL 37, TSH 4.87, lipoprotein A WNL no new labs; most recent labs A1c 6.2, lipids WNL-HDL 37, TSH 4.87, lipoprotein A WNL      Expected Outcome Shelby Vazquez reports making many changes including reduced sodium intake and decreased saturated fat/meat intake. Patient will benefit from participation in intensive cardiac rehab for nutrition, exercise, and lifestyle modification. Goals in action. Shelby Vazquez continues to attend the [facility] education and nutrition series. Shelby Vazquez reports making many changes including reduced sodium intake and decreased saturated fat/meat intake. She has maintained her weight since starting with our program. Patient will benefit from participation in intensive cardiac rehab for nutrition, exercise, and lifestyle modification.               Nutrition Goals Discharge (Final Nutrition Goals Re-Evaluation):  Nutrition Goals Re-Evaluation - 05/09/23 1029       Goals   Current Weight 154 lb 5.2 oz (70 kg)    Comment no new labs; most recent labs A1c 6.2, lipids WNL-HDL 37, TSH 4.87, lipoprotein A WNL    Expected Outcome Goals in action. Shelby Vazquez continues to attend the [facility] education and nutrition series. Shelby Vazquez reports making many changes including reduced sodium intake and decreased saturated fat/meat intake. She has maintained her weight since starting with our program. Patient will benefit from participation in intensive cardiac rehab for nutrition, exercise, and lifestyle modification.             Psychosocial: Target Goals: Acknowledge presence or absence of significant depression and/or stress, maximize coping skills, provide positive support system. Participant is able to verbalize types and ability to use techniques and skills needed for reducing stress and depression.  Initial Review & Psychosocial Screening:  Initial Psych Review & Screening - 04/03/23 1101       Initial Review   Current issues with Current Depression;Current  Stress Concerns    Source of Stress Concerns Chronic Illness;Family    Comments Shelby Vazquez says she is currently depressed  and angry due to her recent cardiac event and diagnosis. Shelby Vazquez is also concerened about her husband as he may need colon surgery this summer. Shelby Vazquez is not currently taking an antidepressant and is not interested counselling. i offered to get an appointment with Shelby Vazquez's PCP Dr Renee Ramus she declines at this time and says that he is aware of her suituation. Shelby Vazquez said her sister commited suicide 23 years ago she would never take her own life as she know it would cause alot of pain and hardship for her family.      Family Dynamics   Good Support System? Yes   Shelby Vazquez has her husband, children and grandchildren for support  Barriers   Psychosocial barriers to participate in program The patient should benefit from training in stress management and relaxation.      Screening Interventions   Interventions Encouraged to exercise;Provide feedback about the scores to participant;To provide support and resources with identified psychosocial needs    Expected Outcomes Long Term Goal: Stressors or current issues are controlled or eliminated.;Short Term goal: Identification and review with participant of any Quality of Life or Depression concerns found by scoring the questionnaire.;Long Term goal: The participant improves quality of Life and PHQ9 Scores as seen by post scores and/or verbalization of changes             Quality of Life Scores:  Quality of Life - 04/03/23 1209       Quality of Life   Select Quality of Life      Quality of Life Scores   Health/Function Pre 11.2 %    Socioeconomic Pre 27.86 %    Psych/Spiritual Pre 6.86 %    Family Pre 21.6 %    GLOBAL Pre 15.26 %            Scores of 19 and below usually indicate a poorer quality of life in these areas.  A difference of  2-3 points is a clinically meaningful difference.  A difference of 2-3 points in the total score of  the Quality of Life Index has been associated with significant improvement in overall quality of life, self-image, physical symptoms, and general health in studies assessing change in quality of life.  PHQ-9: Review Flowsheet  More data exists      04/03/2023 11/08/2022 04/17/2022 02/20/2022 12/24/2021  Depression screen PHQ 2/9  Decreased Interest 0 0 1 0 3 1  Down, Depressed, Hopeless 3 0 1 3 1   PHQ - 2 Score 3 0 2 0 6 2  Altered sleeping 1 0 3 3 1   Tired, decreased energy 3 3 2 3 2   Change in appetite 1 0 0 3 2  Feeling bad or failure about yourself  3 0 1 3 2   Trouble concentrating 3 1 1 3 2   Moving slowly or fidgety/restless 1 0 0 3 0  Suicidal thoughts 0 0 0 2 0  PHQ-9 Score 15 4 9 26 11   Difficult doing work/chores Somewhat difficult Somewhat difficult Somewhat difficult Somewhat difficult -    Details       Multiple values from one day are sorted in reverse-chronological order        Interpretation of Total Score  Total Score Depression Severity:  1-4 = Minimal depression, 5-9 = Mild depression, 10-14 = Moderate depression, 15-19 = Moderately severe depression, 20-27 = Severe depression   Psychosocial Evaluation and Intervention:   Psychosocial Re-Evaluation:  Psychosocial Re-Evaluation     Row Name 04/10/23 0751 04/22/23 1423 05/20/23 1419         Psychosocial Re-Evaluation   Current issues with Current Depression;Current Stress Concerns Current Depression;Current Stress Concerns Current Depression;Current Stress Concerns     Comments Shelby Vazquez started intensive cardiac rehab on 04/09/23. Shelby Vazquez said she felt less depressed on her first day of exercise as she was feeling apprehensive about starting intensive cardiac rehab. Will review quality of life and PHQ-9 in the upcoming week Quality of life questionnaire reviewed with patient and was forwarded to Shelby Vazquez's PCP, Dr Sharen Hones. Shelby Vazquez said she felt less depressed since she has been participating in cardiac rehab Shelby Vazquez has not voiced  any increased  concerns or stressors during exercise at  cardiac rehab     Expected Outcomes -- -- Shelby Vazquez will have decreased stress and depression upon completion of cardiac rehab     Interventions Encouraged to attend Cardiac Rehabilitation for the exercise;Stress management education;Relaxation education Encouraged to attend Cardiac Rehabilitation for the exercise;Stress management education;Relaxation education Encouraged to attend Cardiac Rehabilitation for the exercise;Stress management education;Relaxation education     Continue Psychosocial Services  Follow up required by staff Follow up required by staff No Follow up required       Initial Review   Source of Stress Concerns Chronic Illness;Family Chronic Illness;Family Chronic Illness;Family     Comments Will continue to monitor and offer support as needed Will continue to monitor and offer support as needed Will continue to monitor and offer support as needed              Psychosocial Discharge (Final Psychosocial Re-Evaluation):  Psychosocial Re-Evaluation - 05/20/23 1419       Psychosocial Re-Evaluation   Current issues with Current Depression;Current Stress Concerns    Comments Shelby Vazquez has not voiced any increased  concerns or stressors during exercise at cardiac rehab    Expected Outcomes Shelby Vazquez will have decreased stress and depression upon completion of cardiac rehab    Interventions Encouraged to attend Cardiac Rehabilitation for the exercise;Stress management education;Relaxation education    Continue Psychosocial Services  No Follow up required      Initial Review   Source of Stress Concerns Chronic Illness;Family    Comments Will continue to monitor and offer support as needed             Vocational Rehabilitation: Provide vocational rehab assistance to qualifying candidates.   Vocational Rehab Evaluation & Intervention:  Vocational Rehab - 04/03/23 1107       Initial Vocational Rehab Evaluation & Intervention    Assessment shows need for Vocational Rehabilitation No   Shelby Vazquez is retired and does not need vocational rehab at this time            Education: Education Goals: Education classes will be provided on a weekly basis, covering required topics. Participant will state understanding/return demonstration of topics presented.    Education     Row Name 04/09/23 1500     Education   Cardiac Education Topics Pritikin   Orthoptist   Educator Dietitian   Weekly Topic [employer] Desserts   Instruction Review Code 1- Verbalizes Understanding   Class Start Time 1145   Class Stop Time 1235   Class Time Calculation (min) 50 min    Row Name 04/11/23 1300     Education   Cardiac Education Topics Pritikin   Licensed conveyancer Nutrition   Nutrition Other  label reading   Instruction Review Code 1- Verbalizes Understanding   Class Start Time 1147   Class Stop Time 1226   Class Time Calculation (min) 39 min    Row Name 04/14/23 1300     Education   Cardiac Education Topics Pritikin   Select Workshops     Workshops   Educator Exercise Physiologist   Select Exercise   Exercise Workshop Exercise Basics: Building Your Action Plan   Instruction Review Code 1- Verbalizes Understanding   Class Start Time 1145   Class Stop Time 1230   Class Time Calculation (min) 45 min    Row Name 04/16/23 1300     Education  Cardiac Education Topics Pritikin   Orthoptist   Educator Dietitian   Weekly Topic Tasty Appetizers and Snacks   Instruction Review Code 1- Verbalizes Understanding   Class Start Time 1143   Class Stop Time 1215   Class Time Calculation (min) 32 min    Row Name 04/18/23 1400     Education   Cardiac Education Topics Pritikin   Select Core Videos     Core Videos   Educator Exercise Physiologist   Select Nutrition   Nutrition Nutrition Action Plan   Instruction  Review Code 1- Verbalizes Understanding   Class Start Time 1145   Class Stop Time 1218   Class Time Calculation (min) 33 min    Row Name 04/21/23 1200     Education   Cardiac Education Topics Pritikin   Actor Dietitian   Select Nutrition   Nutrition Calorie Density   Instruction Review Code 1- Verbalizes Understanding   Class Start Time 1145   Class Stop Time 1223   Class Time Calculation (min) 38 min    Row Name 04/23/23 1300     Education   Cardiac Education Topics Pritikin   Customer service manager   Weekly Topic Efficiency Cooking - Meals in a Snap   Instruction Review Code 1- Verbalizes Understanding   Class Start Time 1140   Class Stop Time 1220   Class Time Calculation (min) 40 min    Row Name 04/25/23 1200     Education   Cardiac Education Topics Pritikin   Psychologist, forensic Exercise Education   Exercise Education Move It!   Instruction Review Code 1- Verbalizes Understanding   Class Start Time 1150   Class Stop Time 1225   Class Time Calculation (min) 35 min    Row Name 04/28/23 1000     Education   Cardiac Education Topics Pritikin   Geographical information systems officer Psychosocial   Psychosocial Workshop Focused Goals, Sustainable Changes   Instruction Review Code 1- Verbalizes Understanding   Class Start Time 1135   Class Stop Time 1210   Class Time Calculation (min) 35 min    Row Name 04/30/23 1200     Education   Cardiac Education Topics Pritikin   Orthoptist   Educator Dietitian   Weekly Topic One-Pot Wonders   Instruction Review Code 1- Verbalizes Understanding   Class Start Time 1140   Class Stop Time 1211   Class Time Calculation (min) 31 min    Row Name 05/02/23 1200     Education   Cardiac Education Topics  Pritikin   Psychologist, forensic General Education   General Education Hypertension and Heart Disease   Instruction Review Code 1- Verbalizes Understanding   Class Start Time 1141   Class Stop Time 1217   Class Time Calculation (min) 36 min    Row Name 05/05/23 1100     Education   Cardiac Education Topics Pritikin   Psychologist, forensic Exercise Education   Exercise  Education Biomechanial Limitations   Instruction Review Code 1- Verbalizes Understanding   Class Start Time 1150   Class Stop Time 1230   Class Time Calculation (min) 40 min    Row Name 05/07/23 1300     Education   Cardiac Education Topics Pritikin   Customer service manager   Weekly Topic Comforting Weekend Breakfasts   Instruction Review Code 1- Verbalizes Understanding   Class Start Time 1143   Class Stop Time 1216   Class Time Calculation (min) 33 min    Row Name 05/12/23 1600     Education   Cardiac Education Topics Pritikin   Select Core Videos     Core Videos   Educator Dietitian   Select Nutrition   Nutrition Facts on Fat   Instruction Review Code 1- Verbalizes Understanding   Class Start Time 1150   Class Stop Time 1222   Class Time Calculation (min) 32 min    Row Name 05/14/23 1300     Education   Cardiac Education Topics Pritikin   Orthoptist   Educator Dietitian   Weekly Topic Fast Evening Meals   Instruction Review Code 1- Verbalizes Understanding   Class Start Time 1138   Class Stop Time 1216   Class Time Calculation (min) 38 min    Row Name 05/16/23 1300     Education   Cardiac Education Topics Pritikin   Licensed conveyancer Nutrition   Nutrition Vitamins and Minerals   Instruction Review Code 1- Verbalizes Understanding   Class Start Time  1145   Class Stop Time 1230   Class Time Calculation (min) 45 min    Row Name 05/19/23 1100     Education   Cardiac Education Topics Pritikin   Geographical information systems officer Psychosocial   Psychosocial Workshop Healthy Sleep for a Healthy Heart   Instruction Review Code 1- Verbalizes Understanding   Class Start Time 1148   Class Stop Time 1235   Class Time Calculation (min) 47 min            Core Videos: Exercise    Move It!  Clinical staff conducted group or individual video education with verbal and written material and guidebook.  Patient learns the recommended Pritikin exercise program. Exercise with the goal of living a long, healthy life. Some of the health benefits of exercise include controlled diabetes, healthier blood pressure levels, improved cholesterol levels, improved heart and lung capacity, improved sleep, and better body composition. Everyone should speak with their doctor before starting or changing an exercise routine.  Biomechanical Limitations Clinical staff conducted group or individual video education with verbal and written material and guidebook.  Patient learns how biomechanical limitations can impact exercise and how we can mitigate and possibly overcome limitations to have an impactful and balanced exercise routine.  Body Composition Clinical staff conducted group or individual video education with verbal and written material and guidebook.  Patient learns that body composition (ratio of muscle mass to fat mass) is a key component to assessing overall fitness, rather than body weight alone. Increased fat mass, especially visceral belly fat, can put [place] at increased risk for metabolic syndrome, type 2 diabetes, heart disease, and even death. It is recommended to combine diet and exercise (cardiovascular and  resistance training) to improve your body composition. Seek guidance from your physician and exercise  physiologist before implementing an exercise routine.  Exercise Action Plan Clinical staff conducted group or individual video education with verbal and written material and guidebook.  Patient learns the recommended strategies to achieve and enjoy long-term exercise adherence, including variety, self-motivation, self-efficacy, and positive decision making. Benefits of exercise include fitness, good health, weight management, more energy, better sleep, less stress, and overall well-being.  Medical   Heart Disease Risk Reduction Clinical staff conducted group or individual video education with verbal and written material and guidebook.  Patient learns our heart is our most vital organ as it circulates oxygen, nutrients, white blood cells, and hormones throughout the entire body, and carries waste away. Data supports a plant-based eating plan like the Pritikin Program for its effectiveness in slowing progression of and reversing heart disease. The video provides a number of recommendations to address heart disease.   Metabolic Syndrome and Belly Fat  Clinical staff conducted group or individual video education with verbal and written material and guidebook.  Patient learns what metabolic syndrome is, how it leads to heart disease, and how one can reverse it and keep it from coming back. You have metabolic syndrome if you have 3 of the following 5 criteria: abdominal obesity, high blood pressure, high triglycerides, low HDL cholesterol, and high blood sugar.  Hypertension and Heart Disease Clinical staff conducted group or individual video education with verbal and written material and guidebook.  Patient learns that high blood pressure, or hypertension, is very common in the [place]. Hypertension is largely due to excessive salt intake, but other important risk factors include being overweight, physical inactivity, drinking too much alcohol, smoking, and not eating enough potassium from fruits  and vegetables. High blood pressure is a leading risk factor for heart attack, stroke, congestive heart failure, dementia, kidney failure, and premature death. Long-term effects of excessive salt intake include stiffening of the arteries and thickening of heart muscle and organ damage. Recommendations include ways to reduce hypertension and the risk of heart disease.  Diseases of Our Time - Focusing on Diabetes Clinical staff conducted group or individual video education with verbal and written material and guidebook.  Patient learns why the best way to stop diseases of our time is prevention, through food and other lifestyle changes. Medicine (such as prescription pills and surgeries) is often only a Band-Aid on the problem, not a long-term solution. Most common diseases of our time include obesity, type 2 diabetes, hypertension, heart disease, and cancer. The Pritikin Program is recommended and has been proven to help reduce, reverse, and/or prevent the damaging effects of metabolic syndrome.  Nutrition   Overview of the Pritikin Eating Plan  Clinical staff conducted group or individual video education with verbal and written material and guidebook.  Patient learns about the Pritikin Eating Plan for disease risk reduction. The Pritikin Eating Plan emphasizes a wide variety of unrefined, minimally-processed carbohydrates, like fruits, vegetables, whole grains, and legumes. Go, Caution, and Stop food choices are explained. Plant-based and lean animal proteins are emphasized. Rationale provided for low sodium intake for blood pressure control, low added sugars for blood sugar stabilization, and low added fats and oils for coronary artery disease risk reduction and weight management.  Calorie Density  Clinical staff conducted group or individual video education with verbal and written material and guidebook.  Patient learns about calorie density and how it impacts the Pritikin Eating Plan. Knowing the  characteristics  of the food you choose will help you decide whether those foods will lead to weight gain or weight loss, and whether you want to consume more or less of them. Weight loss is usually a side effect of the Pritikin Eating Plan because of its focus on low calorie-dense foods.  Label Reading  Clinical staff conducted group or individual video education with verbal and written material and guidebook.  Patient learns about the Pritikin recommended label reading guidelines and corresponding recommendations regarding calorie density, added sugars, sodium content, and whole grains.  Dining Out - Part 1  Clinical staff conducted group or individual video education with verbal and written material and guidebook.  Patient learns that restaurant meals can be sabotaging because they can be so high in calories, fat, sodium, and/or sugar. Patient learns recommended strategies on how to positively address this and avoid unhealthy pitfalls.  Facts on Fats  Clinical staff conducted group or individual video education with verbal and written material and guidebook.  Patient learns that lifestyle modifications can be just as effective, if not more so, as many medications for lowering your risk of heart disease. A Pritikin lifestyle can help to reduce your risk of inflammation and atherosclerosis (cholesterol build-up, or plaque, in the artery walls). Lifestyle interventions such as dietary choices and physical activity address the cause of atherosclerosis. A review of the types of fats and their impact on blood cholesterol levels, along with dietary recommendations to reduce fat intake is also included.  Nutrition Action Plan  Clinical staff conducted group or individual video education with verbal and written material and guidebook.  Patient learns how to incorporate Pritikin recommendations into their lifestyle. Recommendations include planning and keeping personal health goals in mind as an important  part of their success.  Healthy Mind-Set    Healthy Minds, Bodies, Hearts  Clinical staff conducted group or individual video education with verbal and written material and guidebook.  Patient learns how to identify when they are stressed. Video will discuss the impact of that stress, as well as the many benefits of stress management. Patient will also be introduced to stress management techniques. The way we think, act, and feel has an impact on our hearts.  How Our Thoughts Can Heal Our Hearts  Clinical staff conducted group or individual video education with verbal and written material and guidebook.  Patient learns that negative thoughts can cause depression and anxiety. This can result in negative lifestyle behavior and serious health problems. Cognitive behavioral therapy is an effective method to help control our thoughts in order to change and improve our emotional outlook.  Additional Videos:  Exercise    Improving Performance  Clinical staff conducted group or individual video education with verbal and written material and guidebook.  Patient learns to use a non-linear approach by alternating intensity levels and lengths of time spent exercising to help burn more calories and lose more body fat. Cardiovascular exercise helps improve heart health, metabolism, hormonal balance, blood sugar control, and recovery from fatigue. Resistance training improves strength, endurance, balance, coordination, reaction time, metabolism, and muscle mass. Flexibility exercise improves circulation, posture, and balance. Seek guidance from your physician and exercise physiologist before implementing an exercise routine and learn your capabilities and proper form for all exercise.  Introduction to Yoga  Clinical staff conducted group or individual video education with verbal and written material and guidebook.  Patient learns about yoga, a discipline of the coming together of mind, breath, and body. The  benefits of yoga  include improved flexibility, improved range of motion, better posture and core strength, increased lung function, weight loss, and positive self-image. Yoga's heart health benefits include lowered blood pressure, healthier heart rate, decreased cholesterol and triglyceride levels, improved immune function, and reduced stress. Seek guidance from your physician and exercise physiologist before implementing an exercise routine and learn your capabilities and proper form for all exercise.  Medical   Aging: Enhancing Your Quality of Life  Clinical staff conducted group or individual video education with verbal and written material and guidebook.  Patient learns key strategies and recommendations to stay in good physical health and enhance quality of life, such as prevention strategies, having an advocate, securing a Health Care Proxy and Power of Attorney, and keeping a list of medications and system for tracking them. It also discusses how to avoid risk for bone loss.  Biology of Weight Control  Clinical staff conducted group or individual video education with verbal and written material and guidebook.  Patient learns that weight gain occurs because we consume more calories than we burn (eating more, moving less). Even if your body weight is normal, you may have higher ratios of fat compared to muscle mass. Too much body fat puts you at increased risk for cardiovascular disease, heart attack, stroke, type 2 diabetes, and obesity-related cancers. In addition to exercise, following the Pritikin Eating Plan can help reduce your risk.  Decoding Lab Results  Clinical staff conducted group or individual video education with verbal and written material and guidebook.  Patient learns that lab test reflects one measurement whose values change over time and are influenced by many factors, including medication, stress, sleep, exercise, food, hydration, pre-existing medical conditions, and more. It is  recommended to use the knowledge from this video to become more involved with your lab results and evaluate your numbers to speak with your doctor.   Diseases of Our Time - Overview  Clinical staff conducted group or individual video education with verbal and written material and guidebook.  Patient learns that according to the CDC, 50% to 70% of chronic diseases (such as obesity, type 2 diabetes, elevated lipids, hypertension, and heart disease) are avoidable through lifestyle improvements including healthier food choices, listening to satiety cues, and increased physical activity.  Sleep Disorders Clinical staff conducted group or individual video education with verbal and written material and guidebook.  Patient learns how good quality and duration of sleep are important to overall health and well-being. Patient also learns about sleep disorders and how they impact health along with recommendations to address them, including discussing with a physician.  Nutrition  Dining Out - Part 2 Clinical staff conducted group or individual video education with verbal and written material and guidebook.  Patient learns how to plan ahead and communicate in order to maximize their dining experience in a healthy and nutritious manner. Included are recommended food choices based on the type of restaurant the patient is visiting.   Fueling a Banker conducted group or individual video education with verbal and written material and guidebook.  There is a strong connection between our food choices and our health. Diseases like obesity and type 2 diabetes are very prevalent and are in large-part due to lifestyle choices. The Pritikin Eating Plan provides plenty of food and hunger-curbing satisfaction. It is easy to follow, affordable, and helps reduce health risks.  Menu Workshop  Clinical staff conducted group or individual video education with verbal and written material and guidebook.   Patient  learns that restaurant meals can sabotage health goals because they are often packed with calories, fat, sodium, and sugar. Recommendations include strategies to plan ahead and to communicate with the manager, chef, or server to help order a healthier meal.  Planning Your Eating Strategy  Clinical staff conducted group or individual video education with verbal and written material and guidebook.  Patient learns about the Pritikin Eating Plan and its benefit of reducing the risk of disease. The Pritikin Eating Plan does not focus on calories. Instead, it emphasizes high-quality, nutrient-rich foods. By knowing the characteristics of the foods, we choose, we can determine their calorie density and make informed decisions.  Targeting Your Nutrition Priorities  Clinical staff conducted group or individual video education with verbal and written material and guidebook.  Patient learns that lifestyle habits have a tremendous impact on disease risk and progression. This video provides eating and physical activity recommendations based on your personal health goals, such as reducing LDL cholesterol, losing weight, preventing or controlling type 2 diabetes, and reducing high blood pressure.  Vitamins and Minerals  Clinical staff conducted group or individual video education with verbal and written material and guidebook.  Patient learns different ways to obtain key vitamins and minerals, including through a recommended healthy diet. It is important to discuss all supplements you take with your doctor.   Healthy Mind-Set    Smoking Cessation  Clinical staff conducted group or individual video education with verbal and written material and guidebook.  Patient learns that cigarette smoking and tobacco addiction pose a serious health risk which affects millions of people. Stopping smoking will significantly reduce the risk of heart disease, lung disease, and many forms of cancer. Recommended  strategies for quitting are covered, including working with your doctor to develop a successful plan.  Culinary   Becoming a Set designer conducted group or individual video education with verbal and written material and guidebook.  Patient learns that cooking at home can be healthy, cost-effective, quick, and puts them in control. Keys to cooking healthy recipes will include looking at your recipe, assessing your equipment needs, planning ahead, making it simple, choosing cost-effective seasonal ingredients, and limiting the use of added fats, salts, and sugars.  Cooking - Breakfast and Snacks  Clinical staff conducted group or individual video education with verbal and written material and guidebook.  Patient learns how important breakfast is to satiety and nutrition through the entire day. Recommendations include key foods to eat during breakfast to help stabilize blood sugar levels and to prevent overeating at meals later in the day. Planning ahead is also a key component.  Cooking - Educational psychologist conducted group or individual video education with verbal and written material and guidebook.  Patient learns eating strategies to improve overall health, including an approach to cook more at home. Recommendations include thinking of animal protein as a side on your plate rather than center stage and focusing instead on lower calorie dense options like vegetables, fruits, whole grains, and plant-based proteins, such as beans. Making sauces in large quantities to freeze for later and leaving the skin on your vegetables are also recommended to maximize your experience.  Cooking - Healthy Salads and Dressing Clinical staff conducted group or individual video education with verbal and written material and guidebook.  Patient learns that vegetables, fruits, whole grains, and legumes are the foundations of the Pritikin Eating Plan. Recommendations include how to  incorporate each of these in flavorful and healthy  salads, and how to create homemade salad dressings. Proper handling of ingredients is also covered. Cooking - Soups and [employer] - Soups and Desserts Clinical staff conducted group or individual video education with verbal and written material and guidebook.  Patient learns that Pritikin soups and desserts make for easy, nutritious, and delicious snacks and meal components that are low in sodium, fat, sugar, and calorie density, while high in vitamins, minerals, and filling fiber. Recommendations include simple and healthy ideas for soups and desserts.   Overview     The Pritikin Solution Program Overview Clinical staff conducted group or individual video education with verbal and written material and guidebook.  Patient learns that the results of the Pritikin Program have been documented in more than 100 articles published in peer-reviewed journals, and the benefits include reducing risk factors for (and, in some cases, even reversing) high cholesterol, high blood pressure, type 2 diabetes, obesity, and more! An overview of the three key pillars of the Pritikin Program will be covered: eating well, doing regular exercise, and having a healthy mind-set.  WORKSHOPS  Exercise: Exercise Basics: Building Your Action Plan Clinical staff led group instruction and group discussion with PowerPoint presentation and patient guidebook. To enhance the learning environment the use of posters, models and videos may be added. At the conclusion of this workshop, patients will comprehend the difference between physical activity and exercise, as well as the benefits of incorporating both, into their routine. Patients will understand the FITT (Frequency, Intensity, Time, and Type) principle and how to use it to build an exercise action plan. In addition, safety concerns and other considerations for exercise and cardiac rehab will be addressed by the  presenter. The purpose of this lesson is to promote a comprehensive and effective weekly exercise routine in order to improve patients' overall level of fitness.   Managing Heart Disease: Your Path to a Healthier Heart Clinical staff led group instruction and group discussion with PowerPoint presentation and patient guidebook. To enhance the learning environment the use of posters, models and videos may be added.At the conclusion of this workshop, patients will understand the anatomy and physiology of the heart. Additionally, they will understand how Pritikin's three pillars impact the risk factors, the progression, and the management of heart disease.  The purpose of this lesson is to provide a high-level overview of the heart, heart disease, and how the Pritikin lifestyle positively impacts risk factors.  Exercise Biomechanics Clinical staff led group instruction and group discussion with PowerPoint presentation and patient guidebook. To enhance the learning environment the use of posters, models and videos may be added. Patients will learn how the structural parts of their bodies function and how these functions impact their daily activities, movement, and exercise. Patients will learn how to promote a neutral spine, learn how to manage pain, and identify ways to improve their physical movement in order to promote healthy living. The purpose of this lesson is to expose patients to common physical limitations that impact physical activity. Participants will learn practical ways to adapt and manage aches and pains, and to minimize their effect on regular exercise. Patients will learn how to maintain good posture while sitting, walking, and lifting.  Balance Training and Fall Prevention  Clinical staff led group instruction and group discussion with PowerPoint presentation and patient guidebook. To enhance the learning environment the use of posters, models and videos may be added. At the  conclusion of this workshop, patients will understand the importance of their sensorimotor  skills (vision, proprioception, and the vestibular system) in maintaining their ability to balance as they age. Patients will apply a variety of balancing exercises that are appropriate for their current level of function. Patients will understand the common causes for poor balance, possible solutions to these problems, and ways to modify their physical environment in order to minimize their fall risk. The purpose of this lesson is to teach patients about the importance of maintaining balance as they age and ways to minimize their risk of falling.  WORKSHOPS   Nutrition:  Fueling a Ship broker led group instruction and group discussion with PowerPoint presentation and patient guidebook. To enhance the learning environment the use of posters, models and videos may be added. Patients will review the foundational principles of the Pritikin Eating Plan and understand what constitutes a serving size in each of the food groups. Patients will also learn Pritikin-friendly foods that are better choices when away from home and review make-ahead meal and snack options. Calorie density will be reviewed and applied to three nutrition priorities: weight maintenance, weight loss, and weight gain. The purpose of this lesson is to reinforce (in a group setting) the key concepts around what patients are recommended to eat and how to apply these guidelines when away from home by planning and selecting Pritikin-friendly options. Patients will understand how calorie density may be adjusted for different weight management goals.  Mindful Eating  Clinical staff led group instruction and group discussion with PowerPoint presentation and patient guidebook. To enhance the learning environment the use of posters, models and videos may be added. Patients will briefly review the concepts of the Pritikin Eating Plan and the  importance of low-calorie dense foods. The concept of mindful eating will be introduced as well as the importance of paying attention to internal hunger signals. Triggers for non-hunger eating and techniques for dealing with triggers will be explored. The purpose of this lesson is to provide patients with the opportunity to review the basic principles of the Pritikin Eating Plan, discuss the value of eating mindfully and how to measure internal cues of hunger and fullness using the Hunger Scale. Patients will also discuss reasons for non-hunger eating and learn strategies to use for controlling emotional eating.  Targeting Your Nutrition Priorities Clinical staff led group instruction and group discussion with PowerPoint presentation and patient guidebook. To enhance the learning environment the use of posters, models and videos may be added. Patients will learn how to determine their genetic susceptibility to disease by reviewing their family history. Patients will gain insight into the importance of diet as part of an overall healthy lifestyle in mitigating the impact of genetics and other environmental insults. The purpose of this lesson is to provide patients with the opportunity to assess their personal nutrition priorities by looking at their family history, their own health history and current risk factors. Patients will also be able to discuss ways of prioritizing and modifying the Pritikin Eating Plan for their highest risk areas  Menu  Clinical staff led group instruction and group discussion with PowerPoint presentation and patient guidebook. To enhance the learning environment the use of posters, models and videos may be added. Using menus brought in from [employer], or printed from [employer], patients will apply the Pritikin dining out guidelines that were presented in the [employer] video. Patients will also be able to practice these guidelines in a variety of  provided scenarios. The purpose of this lesson is to provide  patients with the opportunity to practice hands-on learning of the [employer] guidelines with actual menus and practice scenarios.  Label Reading Clinical staff led group instruction and group discussion with PowerPoint presentation and patient guidebook. To enhance the learning environment the use of posters, models and videos may be added. Patients will review and discuss the Pritikin label reading guidelines presented in Pritikin's Label Reading Educational series video. Using fool labels brought in from local grocery stores and markets, patients will apply the label reading guidelines and determine if the packaged food meet the Pritikin guidelines. The purpose of this lesson is to provide patients with the opportunity to review, discuss, and practice hands-on learning of the Pritikin Label Reading guidelines with actual packaged food labels. Cooking School  Pritikin's [employer] are designed to teach patients ways to prepare quick, simple, and affordable recipes at home. The importance of nutrition's role in chronic disease risk reduction is reflected in its emphasis in the overall Pritikin program. By learning how to prepare essential core Pritikin Eating Plan recipes, patients will increase control over what they eat; be able to customize the flavor of foods without the use of added salt, sugar, or fat; and improve the quality of the food they consume. By learning a set of core recipes which are easily assembled, quickly prepared, and affordable, patients are more likely to prepare more healthy foods at home. These workshops focus on convenient breakfasts, simple entres, side dishes, and desserts which can be prepared with minimal effort and are consistent with nutrition recommendations for cardiovascular risk reduction. Cooking [employer] are taught by a Armed forces logistics/support/administrative officer (RD) who has been trained by the  [employer]. The chef or RD has a clear understanding of the importance of minimizing - if not completely eliminating - added fat, sugar, and sodium in recipes. Throughout the series of Cooking School Workshop sessions, patients will learn about healthy ingredients and efficient methods of cooking to build confidence in their capability to prepare    Cooking School weekly topics:  Adding Flavor- Sodium-Free  Fast and Healthy Breakfasts  Powerhouse Plant-Based Proteins  Satisfying Salads and Dressings  Simple Sides and Sauces  International Cuisine-Spotlight on the [employer] Zones  Delicious Desserts  Savory Soups  [employer] - Meals in a Astronomer Appetizers and Snacks  Comforting Weekend Breakfasts  One-Pot Wonders   Fast Evening Meals  Landscape architect Your Pritikin Plate  WORKSHOPS   Healthy Mindset (Psychosocial):  Focused Goals, Sustainable Changes Clinical staff led group instruction and group discussion with PowerPoint presentation and patient guidebook. To enhance the learning environment the use of posters, models and videos may be added. Patients will be able to apply effective goal setting strategies to establish at least one personal goal, and then take consistent, meaningful action toward that goal. They will learn to identify common barriers to achieving personal goals and develop strategies to overcome them. Patients will also gain an understanding of how our mind-set can impact our ability to achieve goals and the importance of cultivating a positive and growth-oriented mind-set. The purpose of this lesson is to provide patients with a deeper understanding of how to set and achieve personal goals, as well as the tools and strategies needed to overcome common obstacles which may arise along the way.  From Head to Heart: The Power of a Healthy Outlook  Clinical staff led group instruction and group discussion with PowerPoint presentation  and patient guidebook. To  enhance the learning environment the use of posters, models and videos may be added. Patients will be able to recognize and describe the impact of emotions and mood on physical health. They will discover the importance of self-care and explore self-care practices which may work for them. Patients will also learn how to utilize the 4 C's to cultivate a healthier outlook and better manage stress and challenges. The purpose of this lesson is to demonstrate to patients how a healthy outlook is an essential part of maintaining good health, especially as they continue their cardiac rehab journey.  Healthy Sleep for a Healthy Heart Clinical staff led group instruction and group discussion with PowerPoint presentation and patient guidebook. To enhance the learning environment the use of posters, models and videos may be added. At the conclusion of this workshop, patients will be able to demonstrate knowledge of the importance of sleep to overall health, well-being, and quality of life. They will understand the symptoms of, and treatments for, common sleep disorders. Patients will also be able to identify daytime and nighttime behaviors which impact sleep, and they will be able to apply these tools to help manage sleep-related challenges. The purpose of this lesson is to provide patients with a general overview of sleep and outline the importance of quality sleep. Patients will learn about a few of the most common sleep disorders. Patients will also be introduced to the concept of "sleep hygiene," and discover ways to self-manage certain sleeping problems through simple daily behavior changes. Finally, the workshop will motivate patients by clarifying the links between quality sleep and their goals of heart-healthy living.   Recognizing and Reducing Stress Clinical staff led group instruction and group discussion with PowerPoint presentation and patient guidebook. To enhance the learning  environment the use of posters, models and videos may be added. At the conclusion of this workshop, patients will be able to understand the types of stress reactions, differentiate between acute and chronic stress, and recognize the impact that chronic stress has on their health. They will also be able to apply different coping mechanisms, such as reframing negative self-talk. Patients will have the opportunity to practice a variety of stress management techniques, such as deep abdominal breathing, progressive muscle relaxation, and/or guided imagery.  The purpose of this lesson is to educate patients on the role of stress in their lives and to provide healthy techniques for coping with it.  Learning Barriers/Preferences:  Learning Barriers/Preferences - 04/03/23 1213       Learning Barriers/Preferences   Learning Barriers Exercise Concerns;Hearing   HOH and balance concerns   Learning Preferences Computer/Internet;Group Instruction;Individual Instruction;Skilled Demonstration;Video;Written Material;Pictoral             Education Topics:  Knowledge Questionnaire Score:  Knowledge Questionnaire Score - 04/03/23 1214       Knowledge Questionnaire Score   Pre Score 21/24             Core Components/Risk Factors/Patient Goals at Admission:  Personal Goals and Risk Factors at Admission - 04/03/23 1216       Core Components/Risk Factors/Patient Goals on Admission    Weight Management Yes;Weight Loss    Intervention Weight Management: Develop a combined nutrition and exercise program designed to reach desired caloric intake, while maintaining appropriate intake of nutrient and fiber, sodium and fats, and appropriate energy expenditure required for the weight goal.;Weight Management: Provide education and appropriate resources to help participant work on and attain dietary goals.;Weight Management/Obesity: Establish reasonable short term and long term  weight goals.;Obesity: Provide  education and appropriate resources to help participant work on and attain dietary goals.    Admit Weight 154 lb 8.7 oz (70.1 kg)    Hypertension Yes    Intervention Provide education on lifestyle modifcations including regular physical activity/exercise, weight management, moderate sodium restriction and increased consumption of fresh fruit, vegetables, and low fat dairy, alcohol moderation, and smoking cessation.;Monitor prescription use compliance.    Expected Outcomes Short Term: Continued assessment and intervention until BP is < 140/38mm HG in hypertensive participants. < 130/14mm HG in hypertensive participants with diabetes, heart failure or chronic kidney disease.;Long Term: Maintenance of blood pressure at goal levels.    Lipids Yes    Intervention Provide education and support for participant on nutrition & aerobic/resistive exercise along with prescribed medications to achieve LDL 70mg , HDL >40mg .    Expected Outcomes Short Term: Participant states understanding of desired cholesterol values and is compliant with medications prescribed. Participant is following exercise prescription and nutrition guidelines.;Long Term: Cholesterol controlled with medications as prescribed, with individualized exercise RX and with personalized nutrition plan. Value goals: LDL < 70mg , HDL > 40 mg.    Stress Yes    Intervention Offer individual and/or small group education and counseling on adjustment to heart disease, stress management and health-related lifestyle change. Teach and support self-help strategies.;Refer participants experiencing significant psychosocial distress to appropriate mental health specialists for further evaluation and treatment. When possible, include family members and significant others in education/counseling sessions.    Expected Outcomes Short Term: Participant demonstrates changes in health-related behavior, relaxation and other stress management skills, ability to obtain effective  social support, and compliance with psychotropic medications if prescribed.;Long Term: Emotional wellbeing is indicated by absence of clinically significant psychosocial distress or social isolation.    Personal Goal Other Yes    Personal Goal short term: balance training, know limits to exercise Long term: tuned up to keep up with grandkids, 14 min paced mile    Intervention Will continue to monitor pt and progress workloads as tolerated without sign or symptom    Expected Outcomes Pt will achive her goals and gain strength             Core Components/Risk Factors/Patient Goals Review:   Goals and Risk Factor Review     Row Name 04/10/23 0759 04/22/23 1432 05/20/23 1425         Core Components/Risk Factors/Patient Goals Review   Personal Goals Review Weight Management/Obesity;Hypertension;Lipids;Stress Weight Management/Obesity;Hypertension;Lipids;Stress Weight Management/Obesity;Hypertension;Lipids;Stress     Review Shelby Vazquez started intensive cardiac rehab on 04/09/23 and did well with exercise. Spot checked CBG as Shelby Vazquez is a diet controlled prediabetic. Vital signs and CBG's were stable Shelby Vazquez is doing  well with exercise at cardiac rehab.. Vital signs have been stable. Shelby Vazquez is enjoying participating in cardiac rehab. Shelby Vazquez says coming to cardiac rehab his her "happy place" Shelby Vazquez continues to do  well with exercise at cardiac rehab.. Vital signs remain stable. Shelby Vazquez continues to enjoy participating in cardiac rehab.     Expected Outcomes Shelby Vazquez will continue to participate in intensive cardiac rehab for exercise, nutrition and lifestyle modifications Shelby Vazquez will continue to participate in intensive cardiac rehab for exercise, nutrition and lifestyle modifications Shelby Vazquez will continue to participate in intensive cardiac rehab for exercise, nutrition and lifestyle modifications              Core Components/Risk Factors/Patient Goals at Discharge (Final Review):   Goals and Risk Factor Review - 05/20/23 1425  Core Components/Risk Factors/Patient Goals Review   Personal Goals Review Weight Management/Obesity;Hypertension;Lipids;Stress    Review Shelby Vazquez continues to do  well with exercise at cardiac rehab.. Vital signs remain stable. Shelby Vazquez continues to enjoy participating in cardiac rehab.    Expected Outcomes Shelby Vazquez will continue to participate in intensive cardiac rehab for exercise, nutrition and lifestyle modifications             ITP Comments:  ITP Comments     Row Name 04/03/23 0916 04/10/23 0749 04/22/23 1421 05/20/23 1417     ITP Comments Armanda Magic, MD: Medical Director.  Introduction to the Pritikin Education program / Intensive Cardiac Rehab.  Initial orientation packet reviewed with the patient. 30 Day ITP Review. Shelby Vazquez started intensive cardiac rehab on 04/09/23 and did well with exercise 30 Day ITP Review. Shelby Vazquez has good attendance and participation in intensive cardiac rehab exercise and is off to a good start to exercoise 30 Day ITP Review. Shelby Vazquez has good attendance and participation in intensive cardiac rehab exercise             Comments: See ITP Comments

## 2023-05-21 ENCOUNTER — Encounter (INDEPENDENT_AMBULATORY_CARE_PROVIDER_SITE_OTHER): Payer: Self-pay

## 2023-05-21 ENCOUNTER — Encounter (HOSPITAL_COMMUNITY)
Admission: RE | Admit: 2023-05-21 | Discharge: 2023-05-21 | Disposition: A | Discharge status: Home / Self Care | Payer: Medicare PPO | Source: Ambulatory Visit | Attending: Cardiology | Admitting: Cardiology

## 2023-05-21 DIAGNOSIS — I252 Old myocardial infarction: Secondary | ICD-10-CM | POA: Diagnosis not present

## 2023-05-21 DIAGNOSIS — I214 Non-ST elevation (NSTEMI) myocardial infarction: Secondary | ICD-10-CM

## 2023-05-21 DIAGNOSIS — I5181 Takotsubo syndrome: Secondary | ICD-10-CM | POA: Diagnosis not present

## 2023-05-23 ENCOUNTER — Ambulatory Visit (HOSPITAL_BASED_OUTPATIENT_CLINIC_OR_DEPARTMENT_OTHER): Payer: Medicare PPO | Admitting: Family

## 2023-05-23 ENCOUNTER — Encounter (HOSPITAL_COMMUNITY)
Admission: RE | Admit: 2023-05-23 | Discharge: 2023-05-23 | Disposition: A | Discharge status: Home / Self Care | Payer: Medicare PPO | Source: Ambulatory Visit | Attending: Cardiology | Admitting: Cardiology

## 2023-05-23 ENCOUNTER — Encounter (HOSPITAL_BASED_OUTPATIENT_CLINIC_OR_DEPARTMENT_OTHER): Payer: Self-pay | Admitting: Family

## 2023-05-23 VITALS — BP 111/75 | HR 86 | Ht 64.5 in | Wt 148.0 lb

## 2023-05-23 DIAGNOSIS — I77810 Thoracic aortic ectasia: Secondary | ICD-10-CM | POA: Diagnosis not present

## 2023-05-23 DIAGNOSIS — E785 Hyperlipidemia, unspecified: Secondary | ICD-10-CM | POA: Diagnosis not present

## 2023-05-23 DIAGNOSIS — I214 Non-ST elevation (NSTEMI) myocardial infarction: Secondary | ICD-10-CM | POA: Diagnosis not present

## 2023-05-23 DIAGNOSIS — I5181 Takotsubo syndrome: Secondary | ICD-10-CM | POA: Diagnosis not present

## 2023-05-23 DIAGNOSIS — R002 Palpitations: Secondary | ICD-10-CM

## 2023-05-23 DIAGNOSIS — I10 Essential (primary) hypertension: Secondary | ICD-10-CM

## 2023-05-23 NOTE — Patient Instructions (Addendum)
Medication Instructions:  Continue your current medications.   *If you need a refill on your cardiac medications before your next appointment, please call your pharmacy*  Testing/Procedures: Proceed with echocardiogram as scheduled 06/02/23.   When we look on your echocardiogram our goal is for your ejection fraction to be 50-65%. As long as it is improving from the prior of 35-40% we will be pleased!  Follow-Up: At [facility], you and your health needs are our priority.  As part of our continuing mission to provide you with exceptional heart care, we have created designated Provider Care Teams.  These Care Teams include your primary Cardiologist (physician) and Advanced Practice Providers (APPs -  Physician Assistants and Nurse Practitioners) who all work together to provide you with the care you need, when you need it.  We recommend signing up for the patient portal called "MyChart".  Sign up information is provided on this After Visit Summary.  MyChart is used to connect with patients for Virtual Visits (Telemedicine).  Patients are able to view lab/test results, encounter notes, upcoming appointments, etc.  Non-urgent messages can be sent to your provider as well.   To learn more about what you can do with MyChart, go to [email or web address].    Your next appointment:   3-4 month(s)  Provider:   Bryan Lemma, MD  or Alver Sorrow, NP

## 2023-05-23 NOTE — Progress Notes (Signed)
Cardiology Office Note:  .   Date:  05/23/2023  ID:  SEMONE Vazquez, DOB August 25, 1951, MRN [id number] PCP: Eustaquio Boyden, MD  [facility] Providers Cardiologist:  Bryan Lemma, MD Cardiology APP:  Alver Sorrow, NP    History of Present Illness: .   Shelby Vazquez is a 72 y.o. female with a hx of hypertension, aortic atherosclerosis, hyperlipidemia, palpitations, TIA, iron deficiency anemia, GERD, MDD.   Prior cardiac work-up includes echocardiogram 09/2017 revealing normal LVEF 55 to 60%, grade 1 diastolic dysfunction, mild dilation of ascending aorta 42 mm, trivial MR.  She underwent cardiac CTA 09/2017 revealing coronary calcium score of 53 placing her in the 72nd percentile for age and sex pressure control.  She had mild nonobstructive coronary disease (prox LAD 25-50%, LCx 0-25%). Ascending aorta measured 40x36mm.    She was seen 12/20/2021 for preop clearance for removal of deep implant right foot by Ortho.  She had an episode of chest discomfort earlier that week while sitting and watching television.  Chest pain thought to be non cardiac, clearance for surgery provided, and started on low dose Losartan 12.5mg  QD. At follow up 12/24/21 with her PPC dose was increased to 25mg  QD.    Seen 03/19/22 noted fatigue - CMP, CBC, thyroid panel updated which were unremarkable. Seen 07/09/22. She was doing well from a cardiac perspective with LDL of 53 and had enjoyed trip to [place] and [place]. Hospitalized in 08/2022 with Interstim replacement for bladder control. Notes complicated by infection.    Seen 12/20/2022 doing well from a cardiac perspective though BP not at goal and losartan was increased to 50 mg daily.   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  LVEF 35-40% with wall motion abnormalities and grade 1 diastolic dysfunction.  Losartan transitioned to Entresto and Lopressor transition to Toprol-XL.   Spironolactone also added.  Not SGLT2 candidate due to prior history of bladder infections and sepsis.  She presents today for follow up. She is really enjoying participating in cardiac rehab and is seeing marked improvement in her stamina, exercise tolerance. Reports no shortness of breath nor dyspnea on exertion. Reports no chest pain, pressure, or tightness. No edema, orthopnea, PND. Reports no palpitations.  She is excited for her upcoming echocardiogram to reassess LVEF.   ROS: Please see the history of present illness.    All other systems reviewed and are negative.   Studies Reviewed: .        Cardiac Studies & Procedures   CARDIAC CATHETERIZATION  CARDIAC CATHETERIZATION 03/06/2023  Narrative   LV end diastolic pressure is mildly elevated.  Normal coronary arteries.  Moderately severe LV function with wall motion abnormality consistent with Takotsubo cardiomyopathy.  EF estimate approximately 35%; LVEDP 17 mmHg.  RECOMMENDATION: Guideline directed medical therapy for reduced LV function.  Consider transition from losartan to Entresto, change to metoprolol succinate or carvedilol for beta-blocker therapy, and consider subsequent initiation of Jardiance and spironolactone.  Findings Coronary Findings Diagnostic  Dominance: Right  Ramus Intermedius Vessel is large.  Left Circumflex  First Obtuse Marginal Branch  Intervention  No interventions have been documented.     ECHOCARDIOGRAM  ECHOCARDIOGRAM COMPLETE 03/06/2023  Narrative ECHOCARDIOGRAM REPORT    [name] Date of Exam: 03/06/2023 Medical Rec #:  [id number]      Height:       64.0 in Accession #:    [id number]  Weight:       158.0 lb Date of Birth:  11/14/1950      BSA:          1.770 m Patient Age:    72 years       BP:           104/63 mmHg Patient Gender: F              HR:           80 bpm. Exam Location:  Inpatient  Procedure: 2D Echo, Cardiac Doppler, Color Doppler and  Intracardiac Opacification Agent  Indications:    Acute Ischemic heart disease  History:        Patient has prior history of Echocardiogram examinations, most recent 09/24/2017.  Sonographer:    Lucy Antigua Referring Phys: [phone] PETER M [place]  IMPRESSIONS   1. Left ventricular ejection fraction, by estimation, is 35 to 40%. The left ventricle has moderately decreased function. The left ventricle demonstrates regional wall motion abnormalities (see scoring diagram/findings for description). Left ventricular diastolic parameters are consistent with Grade I diastolic dysfunction (impaired relaxation). There is akinesis of the left ventricular, mid inferoseptal wall and anteroseptal wall. There is hypokinesis of the left ventricular, entire inferior wall. There is akinesis of the left ventricular, apical septal wall and anterior wall. 2. Right ventricular systolic function is normal. The right ventricular size is normal. 3. The mitral valve is normal in structure. Trivial mitral valve regurgitation. No evidence of mitral stenosis. 4. The aortic valve is normal in structure. Aortic valve regurgitation is trivial. No aortic stenosis is present. Aortic valve area, by VTI measures 2.94 cm. Aortic valve mean gradient measures 3.0 mmHg. Aortic valve Vmax measures 1.09 m/s. 5. Aortic dilatation noted. There is mild dilatation of the ascending aorta, measuring 41 mm. 6. The inferior vena cava is normal in size with greater than 50% respiratory variability, suggesting right atrial pressure of 3 mmHg.  FINDINGS Left Ventricle: Left ventricular ejection fraction, by estimation, is 35 to 40%. The left ventricle has moderately decreased function. The left ventricle demonstrates regional wall motion abnormalities. The left ventricular internal cavity size was normal in size. There is no left ventricular hypertrophy. Left ventricular diastolic parameters are consistent with Grade I diastolic dysfunction  (impaired relaxation). Normal left ventricular filling pressure.  Right Ventricle: The right ventricular size is normal. No increase in right ventricular wall thickness. Right ventricular systolic function is normal.  Left Atrium: Left atrial size was normal in size.  Right Atrium: Right atrial size was normal in size.  Pericardium: There is no evidence of pericardial effusion.  Mitral Valve: The mitral valve is normal in structure. Trivial mitral valve regurgitation. No evidence of mitral valve stenosis.  Tricuspid Valve: The tricuspid valve is normal in structure. Tricuspid valve regurgitation is trivial. No evidence of tricuspid stenosis.  Aortic Valve: The aortic valve is normal in structure. Aortic valve regurgitation is trivial. No aortic stenosis is present. Aortic valve mean gradient measures 3.0 mmHg. Aortic valve peak gradient measures 4.8 mmHg. Aortic valve area, by VTI measures 2.94 cm.  Pulmonic Valve: The pulmonic valve was normal in structure. Pulmonic valve regurgitation is trivial. No evidence of pulmonic stenosis.  Aorta: Aortic dilatation noted. There is mild dilatation of the ascending aorta, measuring 41 mm.  Venous: The inferior vena cava is normal in size with greater than 50% respiratory variability, suggesting right atrial pressure of 3 mmHg.  IAS/Shunts: No atrial level shunt detected by color flow  Doppler.   LEFT VENTRICLE PLAX 2D LVIDd:         4.10 cm   Diastology LVIDs:         2.70 cm   LV e' medial:    4.03 cm/s LV PW:         1.00 cm   LV E/e' medial:  14.3 LV IVS:        1.00 cm   LV e' lateral:   4.79 cm/s LVOT diam:     2.10 cm   LV E/e' lateral: 12.1 LV SV:         66 LV SV Index:   37 LVOT Area:     3.46 cm   RIGHT VENTRICLE RV S prime:     12.50 cm/s TAPSE (M-mode): 2.3 cm  LEFT ATRIUM             Index        RIGHT ATRIUM           Index LA Vol (A2C):   34.6 ml 19.55 ml/m  RA Area:     15.70 cm LA Vol (A4C):   26.2 ml 14.80 ml/m   RA Volume:   38.80 ml  21.92 ml/m LA Biplane Vol: 31.0 ml 17.52 ml/m AORTIC VALVE AV Area (Vmax):    2.78 cm AV Area (Vmean):   2.88 cm AV Area (VTI):     2.94 cm AV Vmax:           109.00 cm/s AV Vmean:          81.300 cm/s AV VTI:            0.225 m AV Peak Grad:      4.8 mmHg AV Mean Grad:      3.0 mmHg LVOT Vmax:         87.60 cm/s LVOT Vmean:        67.700 cm/s LVOT VTI:          0.191 m LVOT/AV VTI ratio: 0.85  AORTA Ao Root diam: 3.30 cm Ao Asc diam:  4.10 cm  MITRAL VALVE               TRICUSPID VALVE MV Area (PHT): 4.40 cm    TR Peak grad:   18.3 mmHg MV E velocity: 57.80 cm/s  TR Vmax:        214.00 cm/s MV A velocity: 93.80 cm/s MV E/A ratio:  0.62        SHUNTS Systemic VTI:  0.19 m Systemic Diam: 2.10 cm  Armanda Magic MD Electronically signed by Armanda Magic MD Signature Date/Time: 03/06/2023/8:40:54 AM    Final     CT SCANS  CT CORONARY MORPH W/CTA COR W/SCORE 10/06/2017  Addendum 10/06/2017  6:33 PM ADDENDUM REPORT: 10/06/2017 18:31  CLINICAL DATA:  72 year old female with h/o hypertension, hyperlipidemia, DOE and family h/o premature CAD.  EXAM: Cardiac/Coronary  CT  TECHNIQUE: The patient was scanned on a [employer].  FINDINGS: A 120 kV prospective scan was triggered in the descending thoracic aorta at 111 HU's. Axial non-contrast 3 mm slices were carried out through the heart. The data set was analyzed on a dedicated work station and scored using the Agatson method. Gantry rotation speed was 250 msecs and collimation was .6 mm. 5 mg of iv Metoprolol and 0.8 mg of sl NTG was given. The 3D data set was reconstructed in 5% intervals of the 67-82 % of the R-R cycle. Diastolic phases were analyzed  on a dedicated work station using MPR, MIP and VRT modes. The patient received 80 cc of contrast.  Aorta: Aneurysmal dilatation of the ascending aorta with maximum diameter 40 x 38 mm. No calcifications. No dissection.  Aortic  Valve:  Trileaflet.  No calcifications.  Coronary Arteries:  Normal coronary origin.  Right dominance.  RCA is a large dominant artery that gives rise to PDA and PLA. There is trivial plaque in the proximal RCA.  Left main is a short artery that gives rise to LAD and LCX arteries.  LAD is a large and long vessel that gives rise to one diagonal artery and wraps around the apex. There is mild calcified plaque in the proximal LAD with associated stenosis 25-50%  LCX is a non-dominant artery that gives rise to one OM1 branch. There is minimal calcified plaque in the proximal LCX artery with associated stenosis 0-25%.  Other findings:  Normal pulmonary vein drainage into the left atrium.  Normal let atrial appendage without a thrombus.  Normal size of the pulmonary artery.  IMPRESSION: 1. Coronary calcium score of 53. This was 72 percentile for age and sex matched control.  2. Normal coronary origin with right dominance.  3. Mild non-obstructive CAD. Aggressive risk factor modification is recommended.  4. Aneurysmal dilatation of the ascending aorta with maximum diameter 40 x 38 mm. Annual follow up with CTA or MRA is recommended.   Electronically Signed By: Tobias Alexander On: 10/06/2017 18:31  Narrative EXAM: OVER-READ INTERPRETATION  CT CHEST  The following report is an over-read performed by radiologist Dr. Marinda Elk [facility] on 10/06/2017. This over-read does not include interpretation of cardiac or coronary anatomy or pathology. The coronary calcium score/coronary CTA interpretation by the cardiologist is attached.  COMPARISON:  None.  FINDINGS: Mediastinum/Nodes: No masses or lymphadenopathy.  Lungs/Pleura: Lungs show mild bilateral scarring. There is no evidence of pulmonary edema, consolidation, pneumothorax, nodule or pleural fluid in the visualized chest.  Upper Abdomen: No acute abnormality.  Musculoskeletal: No chest wall  mass or suspicious bone lesions identified.  IMPRESSION: No significant nonvascular findings in the imaged portions of the chest.  Electronically Signed: By: Irish Lack M.D. On: 10/06/2017 08:59          Risk Assessment/Calculations:             Physical Exam:   VS:  BP 111/75   Pulse 86   Ht 5' 4.5" (1.638 m)   Wt 148 lb (67.1 kg)   BMI 25.01 kg/m    Wt Readings from Last 3 Encounters:  05/23/23 148 lb (67.1 kg)  05/15/23 151 lb 3.2 oz (68.6 kg)  04/03/23 154 lb 8.7 oz (70.1 kg)    GEN: Well nourished, well developed in no acute distress NECK: No JVD; No carotid bruits CARDIAC: RRR, no murmurs, rubs, gallops RESPIRATORY:  Clear to auscultation without rales, wheezing or rhonchi  ABDOMEN: Soft, non-tender, non-distended EXTREMITIES:  No edema; No deformity   ASSESSMENT AND PLAN: .    Takotsubo cardiomyopathy-LHC 03/06/2023 with normal coronary arteries.  Echo 03/06/2023 LVEF 35-40%. Stamina much improved with cardiac rehab. Has repeat echocardiogram scheduled 06/02/23.   GDMT includes Toprol, Entresto, spironolactone.  Defer SGLT2i due to recurrent UTI. No indication for loop diuretic at this time. Heart healthy diet and regular cardiovascular exercise encouraged.       HTN - BP well controlled. Continue current antihypertensive regimen.  Relatively hypotensive but asymptomatic with no lightheadedness, dizziness.    GERD/IBS -  Follows with primary care and GI.   HLD, LDL goal <70 - 06/2022 LDL 53. Continue Rosuvastatin 20mg  daily. Denies myalgias.    Palpitations - Well controlled on present dose Metoprolol.    MDD - Continue to follow with PCP. Has previously politely declined referral to psychology/psychiatry.    Mild dilation ascending aorta - Echo 2018 42mm. CT angio 09/2019 upper levlel of normal 39mm.  Mild dilation of ascending aorta 41 mm by echo 03/06/2023. Continue optimal BP control and metoprolol.         Dispo: follow up 3-4 months    Signed, Alver Sorrow, NP

## 2023-05-26 ENCOUNTER — Encounter (HOSPITAL_COMMUNITY)
Admission: RE | Admit: 2023-05-26 | Discharge: 2023-05-26 | Disposition: A | Discharge status: Home / Self Care | Payer: Medicare PPO | Source: Ambulatory Visit | Attending: Cardiology | Admitting: Cardiology

## 2023-05-26 DIAGNOSIS — I5181 Takotsubo syndrome: Secondary | ICD-10-CM

## 2023-05-26 DIAGNOSIS — I214 Non-ST elevation (NSTEMI) myocardial infarction: Secondary | ICD-10-CM | POA: Diagnosis not present

## 2023-05-28 ENCOUNTER — Encounter (HOSPITAL_COMMUNITY)
Admission: RE | Admit: 2023-05-28 | Discharge: 2023-05-28 | Disposition: A | Discharge status: Home / Self Care | Payer: Medicare PPO | Source: Ambulatory Visit | Attending: Cardiology | Admitting: Cardiology

## 2023-05-28 DIAGNOSIS — I214 Non-ST elevation (NSTEMI) myocardial infarction: Secondary | ICD-10-CM

## 2023-05-28 DIAGNOSIS — I5181 Takotsubo syndrome: Secondary | ICD-10-CM | POA: Diagnosis not present

## 2023-05-30 ENCOUNTER — Encounter (HOSPITAL_COMMUNITY)
Admission: RE | Admit: 2023-05-30 | Discharge: 2023-05-30 | Disposition: A | Discharge status: Home / Self Care | Payer: Medicare PPO | Source: Ambulatory Visit | Attending: Cardiology | Admitting: Cardiology

## 2023-05-30 ENCOUNTER — Telehealth: Payer: Self-pay

## 2023-05-30 DIAGNOSIS — I5181 Takotsubo syndrome: Secondary | ICD-10-CM

## 2023-05-30 DIAGNOSIS — I214 Non-ST elevation (NSTEMI) myocardial infarction: Secondary | ICD-10-CM | POA: Diagnosis not present

## 2023-05-30 NOTE — Telephone Encounter (Signed)
Call back to patient to re offer PREP. Still in CR.  Feels that she doesn't need the lifestyle components and/or exercises that PREP would offer her.  Will take her off for call back

## 2023-06-02 ENCOUNTER — Other Ambulatory Visit (HOSPITAL_BASED_OUTPATIENT_CLINIC_OR_DEPARTMENT_OTHER): Payer: Medicare PPO

## 2023-06-02 ENCOUNTER — Telehealth (HOSPITAL_BASED_OUTPATIENT_CLINIC_OR_DEPARTMENT_OTHER): Payer: Self-pay | Admitting: *Deleted

## 2023-06-02 ENCOUNTER — Encounter (HOSPITAL_COMMUNITY)
Admission: RE | Admit: 2023-06-02 | Discharge: 2023-06-02 | Disposition: A | Discharge status: Home / Self Care | Payer: Medicare PPO | Source: Ambulatory Visit | Attending: Cardiology | Admitting: Cardiology

## 2023-06-02 DIAGNOSIS — I5181 Takotsubo syndrome: Secondary | ICD-10-CM

## 2023-06-02 DIAGNOSIS — I214 Non-ST elevation (NSTEMI) myocardial infarction: Secondary | ICD-10-CM | POA: Diagnosis not present

## 2023-06-02 NOTE — Telephone Encounter (Addendum)
Spoke with patient regarding the new appointment date and time for the Echocardiogram that we cancelled on 06/02/23 (tech is ill)---- Friday 06/20/23 at 2:00 pm--will check for cancellations to see if we can get patient is sooner.  Patient voiced her understanding.

## 2023-06-03 ENCOUNTER — Other Ambulatory Visit (HOSPITAL_BASED_OUTPATIENT_CLINIC_OR_DEPARTMENT_OTHER): Payer: Self-pay

## 2023-06-03 ENCOUNTER — Ambulatory Visit (INDEPENDENT_AMBULATORY_CARE_PROVIDER_SITE_OTHER): Payer: Medicare PPO

## 2023-06-03 ENCOUNTER — Other Ambulatory Visit: Payer: Self-pay

## 2023-06-03 VITALS — BP 110/76 | Ht 64.5 in | Wt 145.5 lb

## 2023-06-03 DIAGNOSIS — Z0000 Encounter for general adult medical examination without abnormal findings: Secondary | ICD-10-CM

## 2023-06-03 DIAGNOSIS — Z0100 Encounter for examination of eyes and vision without abnormal findings: Secondary | ICD-10-CM

## 2023-06-03 NOTE — Patient Instructions (Signed)
Shelby Vazquez , Thank you for taking time to come for your Medicare Wellness Visit. I appreciate your ongoing commitment to your health goals. Please review the following plan we discussed and let me know if I can assist you in the future.   These are the goals we discussed:  Goals      Patient Stated     "Get over my heart issue"         This is a list of the screening recommended for you and due dates:  Health Maintenance  Topic Date Due   COVID-19 Vaccine (4 - 2023-24 season) 06/21/2022   Flu Shot  05/22/2023   Yearly kidney health urinalysis for diabetes  07/04/2023   Hemoglobin A1C  09/06/2023   Mammogram  10/08/2023   Eye exam for diabetics  10/29/2023   Complete foot exam   11/09/2023   Yearly kidney function blood test for diabetes  03/12/2024   Medicare Annual Wellness Visit  06/02/2024   Pap Smear  10/08/2025   DTaP/Tdap/Td vaccine (4 - Td or Tdap) 08/14/2026   Colon Cancer Screening  04/17/2028   Pneumonia Vaccine  Completed   DEXA scan (bone density measurement)  Completed   Hepatitis C Screening  Completed   Zoster (Shingles) Vaccine  Completed   HPV Vaccine  Aged Out    Advanced directives: Advance directive discussed with you today. Even though you declined this today, please call our office should you change your mind, and we can give you the proper paperwork for you to fill out. Advance care planning is a way to make decisions about medical care that fits your values in case you are ever unable to make these decisions for yourself.  Information on Advanced Care Planning can be found at [facility] Advance Health Care Directives Advance Health Care Directives ([email or web address]    Conditions/risks identified:  You have been referred to [facility] for a complete eye exam. If you haven't heard from them in a few days, please call them to schedule your appointment.  [facility] [address] [place] [address] Phone:  [phone]   Next appointment: VIRTUAL/TELEPHONE APPOINTMENT Follow up in one year for your annual wellness visit  June 09, 2024 at 2:00 pm telephone visit   Preventive Care 65 Years and Older, Female Preventive care refers to lifestyle choices and visits with your health care provider that can promote health and wellness. What does preventive care include? A yearly physical exam. This is also called an annual well check. Dental exams once or twice a year. Routine eye exams. Ask your health care provider how often you should have your eyes checked. Personal lifestyle choices, including: Daily care of your teeth and gums. Regular physical activity. Eating a healthy diet. Avoiding tobacco and drug use. Limiting alcohol use. Practicing safe sex. Taking low-dose aspirin every day. Taking vitamin and mineral supplements as recommended by your health care provider. What happens during an annual well check? The services and screenings done by your health care provider during your annual well check will depend on your age, overall health, lifestyle risk factors, and family history of disease. Counseling  Your health care provider may ask you questions about your: Alcohol use. Tobacco use. Drug use. Emotional well-being. Home and relationship well-being. Sexual activity. Eating habits. History of falls. Memory and ability to understand (cognition). Work and work Astronomer. Reproductive health. Screening  You may have the following tests or measurements: Height,  weight, and BMI. Blood pressure. Lipid and cholesterol levels. These may be checked every 5 years, or more frequently if you are over 84 years old. Skin check. Lung cancer screening. You may have this screening every year starting at age 26 if you have a 30-pack-year history of smoking and currently smoke or have quit within the past 15 years. Fecal occult blood test (FOBT) of the stool. You may have this test every  year starting at age 72. Flexible sigmoidoscopy or colonoscopy. You may have a sigmoidoscopy every 5 years or a colonoscopy every 10 years starting at age 72. Hepatitis C blood test. Hepatitis B blood test. Sexually transmitted disease (STD) testing. Diabetes screening. This is done by checking your blood sugar (glucose) after you have not eaten for a while (fasting). You may have this done every 1-3 years. Bone density scan. This is done to screen for osteoporosis. You may have this done starting at age 72. Mammogram. This may be done every 1-2 years. Talk to your health care provider about how often you should have regular mammograms. Talk with your health care provider about your test results, treatment options, and if necessary, the need for more tests. Vaccines  Your health care provider may recommend certain vaccines, such as: Influenza vaccine. This is recommended every year. Tetanus, diphtheria, and acellular pertussis (Tdap, Td) vaccine. You may need a Td booster every 10 years. Zoster vaccine. You may need this after age 72. Pneumococcal 13-valent conjugate (PCV13) vaccine. One dose is recommended after age 70. Pneumococcal polysaccharide (PPSV23) vaccine. One dose is recommended after age 72. Talk to your health care provider about which screenings and vaccines you need and how often you need them. This information is not intended to replace advice given to you by your health care provider. Make sure you discuss any questions you have with your health care provider. Document Released: 11/03/2015 Document Revised: 06/26/2016 Document Reviewed: 08/08/2015 Elsevier Interactive Patient Education  2017 [employer].  Fall Prevention in the Home Falls can cause injuries. They can happen to people of all ages. There are many things you can do to make your home safe and to help prevent falls. What can I do on the outside of my home? Regularly fix the edges of walkways and driveways and  fix any cracks. Remove anything that might make you trip as you walk through a door, such as a raised step or threshold. Trim any bushes or trees on the path to your home. Use bright outdoor lighting. Clear any walking paths of anything that might make someone trip, such as rocks or tools. Regularly check to see if handrails are loose or broken. Make sure that both sides of any steps have handrails. Any raised decks and porches should have guardrails on the edges. Have any leaves, snow, or ice cleared regularly. Use sand or salt on walking paths during winter. Clean up any spills in your garage right away. This includes oil or grease spills. What can I do in the bathroom? Use night lights. Install grab bars by the toilet and in the tub and shower. Do not use towel bars as grab bars. Use non-skid mats or decals in the tub or shower. If you need to sit down in the shower, use a plastic, non-slip stool. Keep the floor dry. Clean up any water that spills on the floor as soon as it happens. Remove soap buildup in the tub or shower regularly. Attach bath mats securely with double-sided non-slip rug tape.  Do not have throw rugs and other things on the floor that can make you trip. What can I do in the bedroom? Use night lights. Make sure that you have a light by your bed that is easy to reach. Do not use any sheets or blankets that are too big for your bed. They should not hang down onto the floor. Have a firm chair that has side arms. You can use this for support while you get dressed. Do not have throw rugs and other things on the floor that can make you trip. What can I do in the kitchen? Clean up any spills right away. Avoid walking on wet floors. Keep items that you use a lot in easy-to-reach places. If you need to reach something above you, use a strong step stool that has a grab bar. Keep electrical cords out of the way. Do not use floor polish or wax that makes floors slippery. If you  must use wax, use non-skid floor wax. Do not have throw rugs and other things on the floor that can make you trip. What can I do with my stairs? Do not leave any items on the stairs. Make sure that there are handrails on both sides of the stairs and use them. Fix handrails that are broken or loose. Make sure that handrails are as long as the stairways. Check any carpeting to make sure that it is firmly attached to the stairs. Fix any carpet that is loose or worn. Avoid having throw rugs at the top or bottom of the stairs. If you do have throw rugs, attach them to the floor with carpet tape. Make sure that you have a light switch at the top of the stairs and the bottom of the stairs. If you do not have them, ask someone to add them for you. What else can I do to help prevent falls? Wear shoes that: Do not have high heels. Have rubber bottoms. Are comfortable and fit you well. Are closed at the toe. Do not wear sandals. If you use a stepladder: Make sure that it is fully opened. Do not climb a closed stepladder. Make sure that both sides of the stepladder are locked into place. Ask someone to hold it for you, if possible. Clearly mark and make sure that you can see: Any grab bars or handrails. First and last steps. Where the edge of each step is. Use tools that help you move around (mobility aids) if they are needed. These include: Canes. Walkers. Scooters. Crutches. Turn on the lights when you go into a dark area. Replace any light bulbs as soon as they burn out. Set up your furniture so you have a clear path. Avoid moving your furniture around. If any of your floors are uneven, fix them. If there are any pets around you, be aware of where they are. Review your medicines with your doctor. Some medicines can make you feel dizzy. This can increase your chance of falling. Ask your doctor what other things that you can do to help prevent falls. This information is not intended to replace  advice given to you by your health care provider. Make sure you discuss any questions you have with your health care provider. Document Released: 08/03/2009 Document Revised: 03/14/2016 Document Reviewed: 11/11/2014 Elsevier Interactive Patient Education  2017 Elsevier Inc.  Bone Density Test A bone density test uses a type of X-ray to measure the amount of calcium and other minerals in a person's bones. It can  measure bone density in the hip and the spine. The test is similar to having a regular X-ray. This test may also be called: Bone densitometry. Bone mineral density test. Dual-energy X-ray absorptiometry (DEXA). You may have this test to: Diagnose a condition that causes weak or thin bones (osteoporosis). Screen you for osteoporosis. Predict your risk for a broken bone (fracture). Determine how well your osteoporosis treatment is working. Tell a health care provider about: Any allergies you have. All medicines you are taking, including vitamins, herbs, eye drops, creams, and over-the-counter medicines. Any problems you or family members have had with anesthetic medicines. Any blood disorders you have. Any surgeries you have had. Any medical conditions you have. Whether you are pregnant or may be pregnant. Any medical tests you have had within the past 14 days that used contrast material. What are the risks? Generally, this is a safe test. However, it does expose you to a small amount of radiation, which can slightly increase your cancer risk. What happens before the test? Do not take any calcium supplements within the 24 hours before your test. You will need to remove all metal jewelry, eyeglasses, removable dental appliances, and any other metal objects on your body. What happens during the test?  You will lie down on an exam table. There will be an X-ray generator below you and an imaging device above you. Other devices, such as boxes or braces, may be used to position your  body properly for the scan. The machine will slowly scan your body. You will need to keep very still while the machine does the scan. The images will show up on a screen in the room. Images will be examined by a specialist after your test is finished. The procedure may vary among health care providers and hospitals. What can I expect after the test? It is up to you to get the results of your test. Ask your health care provider, or the department that is doing the test, when your results will be ready. Summary A bone density test is an imaging test that uses a type of X-ray to measure the amount of calcium and other minerals in your bones. The test may be used to diagnose or screen you for a condition that causes weak or thin bones (osteoporosis), predict your risk for a broken bone (fracture), or determine how well your osteoporosis treatment is working. Do not take any calcium supplements within 24 hours before your test. Ask your health care provider, or the department that is doing the test, when your results will be ready. This information is not intended to replace advice given to you by your health care provider. Make sure you discuss any questions you have with your health care provider. Document Revised: 06/20/2021 Document Reviewed: 03/23/2020 Elsevier Patient Education  2024 Elsevier Inc.  Diabetes Mellitus and Foot Care Diabetes, also called diabetes mellitus, may cause problems with your feet and legs because of poor blood flow (circulation). Poor circulation may make your skin: Become thinner and drier. Break more easily. Heal more slowly. Peel and crack. You may also have nerve damage (neuropathy). This can cause decreased feeling in your legs and feet. This means that you may not notice minor injuries to your feet that could lead to more serious problems. Finding and treating problems early is the best way to prevent future foot problems. How to care for your feet Foot  hygiene  Wash your feet daily with warm water and mild soap. Do not  use hot water. Then, pat your feet and the areas between your toes until they are fully dry. Do not soak your feet. This can dry your skin. Trim your toenails straight across. Do not dig under them or around the cuticle. File the edges of your nails with an emery board or nail file. Apply a moisturizing lotion or petroleum jelly to the skin on your feet and to dry, brittle toenails. Use lotion that does not contain alcohol and is unscented. Do not apply lotion between your toes. Shoes and socks Wear clean socks or stockings every day. Make sure they are not too tight. Do not wear knee-high stockings. These may decrease blood flow to your legs. Wear shoes that fit well and have enough cushioning. Always look in your shoes before you put them on to be sure there are no objects inside. To break in new shoes, wear them for just a few hours a day. This prevents injuries on your feet. Wounds, scrapes, corns, and calluses  Check your feet daily for blisters, cuts, bruises, sores, and redness. If you cannot see the bottom of your feet, use a mirror or ask someone for help. Do not cut off corns or calluses or try to remove them with medicine. If you find a minor scrape, cut, or break in the skin on your feet, keep it and the skin around it clean and dry. You may clean these areas with mild soap and water. Do not clean the area with peroxide, alcohol, or iodine. If you have a wound, scrape, corn, or callus on your foot, look at it several times a day to make sure it is healing and not infected. Check for: Redness, swelling, or pain. Fluid or blood. Warmth. Pus or a bad smell. General tips Do not cross your legs. This may decrease blood flow to your feet. Do not use heating pads or hot water bottles on your feet. They may burn your skin. If you have lost feeling in your feet or legs, you may not know this is happening until it is too  late. Protect your feet from hot and cold by wearing shoes, such as at the beach or on hot pavement. Schedule a complete foot exam at least once a year or more often if you have foot problems. Report any cuts, sores, or bruises to your health care provider right away. Where to find more information American Diabetes Association: diabetes.org Association of Diabetes Care & Education Specialists: diabeteseducator.org Contact a health care provider if: You have a condition that increases your risk of infection, and you have any cuts, sores, or bruises on your feet. You have an injury that is not healing. You have redness on your legs or feet. You feel burning or tingling in your legs or feet. You have pain or cramps in your legs and feet. Your legs or feet are numb. Your feet always feel cold. You have pain around any toenails. Get help right away if: You have a wound, scrape, corn, or callus on your foot and: You have signs of infection. You have a fever. You have a red line going up your leg. This information is not intended to replace advice given to you by your health care provider. Make sure you discuss any questions you have with your health care provider. Document Revised: 04/10/2022 Document Reviewed: 04/10/2022 Elsevier Patient Education  2024 [employer].  Fall Prevention in the Home, Adult Falls can cause injuries and can happen to people of all  ages. There are many things you can do to make your home safer and to help prevent falls. What actions can I take to prevent falls? General information Use good lighting in all rooms. Make sure to: Replace any light bulbs that burn out. Turn on the lights in dark areas and use night-lights. Keep items that you use often in easy-to-reach places. Lower the shelves around your home if needed. Move furniture so that there are clear paths around it. Do not use throw rugs or other things on the floor that can make you trip. If any of  your floors are uneven, fix them. Add color or contrast paint or tape to clearly mark and help you see: Grab bars or handrails. First and last steps of staircases. Where the edge of each step is. If you use a ladder or stepladder: Make sure that it is fully opened. Do not climb a closed ladder. Make sure the sides of the ladder are locked in place. Have someone hold the ladder while you use it. Know where your pets are as you move through your home. What can I do in the bathroom?     Keep the floor dry. Clean up any water on the floor right away. Remove soap buildup in the bathtub or shower. Buildup makes bathtubs and showers slippery. Use non-skid mats or decals on the floor of the bathtub or shower. Attach bath mats securely with double-sided, non-slip rug tape. If you need to sit down in the shower, use a non-slip stool. Install grab bars by the toilet and in the bathtub and shower. Do not use towel bars as grab bars. What can I do in the bedroom? Make sure that you have a light by your bed that is easy to reach. Do not use any sheets or blankets on your bed that hang to the floor. Have a firm chair or bench with side arms that you can use for support when you get dressed. What can I do in the kitchen? Clean up any spills right away. If you need to reach something above you, use a step stool with a grab bar. Keep electrical cords out of the way. Do not use floor polish or wax that makes floors slippery. What can I do with my stairs? Do not leave anything on the stairs. Make sure that you have a light switch at the top and the bottom of the stairs. Make sure that there are handrails on both sides of the stairs. Fix handrails that are broken or loose. Install non-slip stair treads on all your stairs if they do not have carpet. Avoid having throw rugs at the top or bottom of the stairs. Choose a carpet that does not hide the edge of the steps on the stairs. Make sure that the  carpet is firmly attached to the stairs. Fix carpet that is loose or worn. What can I do on the outside of my home? Use bright outdoor lighting. Fix the edges of walkways and driveways and fix any cracks. Clear paths of anything that can make you trip, such as tools or rocks. Add color or contrast paint or tape to clearly mark and help you see anything that might make you trip as you walk through a door, such as a raised step or threshold. Trim any bushes or trees on paths to your home. Check to see if handrails are loose or broken and that both sides of all steps have handrails. Install guardrails along the edges  of any raised decks and porches. Have leaves, snow, or ice cleared regularly. Use sand, salt, or ice melter on paths if you live where there is ice and snow during the winter. Clean up any spills in your garage right away. This includes grease or oil spills. What other actions can I take? Review your medicines with your doctor. Some medicines can cause dizziness or changes in blood pressure, which increase your risk of falling. Wear shoes that: Have a low heel. Do not wear high heels. Have rubber bottoms and are closed at the toe. Feel good on your feet and fit well. Use tools that help you move around if needed. These include: Canes. Walkers. Scooters. Crutches. Ask your doctor what else you can do to help prevent falls. This may include seeing a physical therapist to learn to do exercises to move better and get stronger. Where to find more information Centers for Disease Control and Prevention, STEADI: TonerPromos.no [employer] on Aging: BaseRingTones.pl National Institute on Aging: BaseRingTones.pl Contact a doctor if: You are afraid of falling at home. You feel weak, drowsy, or dizzy at home. You fall at home. Get help right away if you: Lose consciousness or have trouble moving after a fall. Have a fall that causes a head injury. These symptoms may be an emergency. Get help  right away. Call 911. Do not wait to see if the symptoms will go away. Do not drive yourself to the hospital. This information is not intended to replace advice given to you by your health care provider. Make sure you discuss any questions you have with your health care provider. Document Revised: 06/10/2022 Document Reviewed: 06/10/2022 Elsevier Patient Education  2024 Elsevier Inc.  Hearing Loss Hearing loss is a partial or total loss of the ability to hear. This can be temporary or permanent, and it can happen in one or both ears. There are two types of hearing loss. You can have just one type or both types. You may have a problem with: Damage to your hearing nerves (sensorineural hearing loss). This type of hearing loss is more likely to be permanent. A hearing aid is often the best treatment. Sound getting to your inner ear (conductive hearing loss). This type of hearing loss can usually be treated medically or surgically. Medical care is necessary to treat hearing loss properly and to prevent the condition from getting worse. Your hearing may partially or completely come back, depending on what caused your hearing loss and how severe it is. In some cases, hearing loss is permanent. What are the causes? Common causes of hearing loss include: Too much wax in the ear canal. Infection of the ear canal or middle ear. Fluid in the middle ear. Injury to the ear or surrounding area. An object stuck in the ear. A history of prolonged exposure to loud sounds, such as music. Less common causes of hearing loss include: Tumors in the ear. Viral or bacterial infections, such as meningitis. A hole in the eardrum (perforated eardrum). Problems with the hearing nerve that sends signals between the brain and the ear. Certain medicines. What are the signs or symptoms? Symptoms of this condition may include: Difficulty telling the difference between sounds. Difficulty following a conversation when  there is background noise. Lack of response to sounds in your environment. This may be most noticeable when you do not respond to startling sounds. Needing to turn up the volume on the television, radio, or other devices. Ringing in the ears. Dizziness. How  is this diagnosed? This condition is diagnosed based on: A physical exam. A hearing test (audiometry). The test will be performed by a hearing specialist (audiologist). Imaging tests, such as an MRI or CT scan. You may also be referred to an ear, nose, and throat (ENT) specialist (otolaryngologist). How is this treated? Treatment for hearing loss may include: Earwax removal. Medicines to treat or prevent infection (antibiotics). Medicines to reduce inflammation (corticosteroids). Hearing aids or assistive listening devices. Follow these instructions at home: If you were prescribed an antibiotic medicine, take it as told by your health care provider. Do not stop taking the antibiotic even if you start to feel better. Take over-the-counter and prescription medicines only as told by your health care provider. Avoid loud noises. Use hearing protection if you are exposed to loud noises or work in a noisy environment. Return to your normal activities as told by your health care provider. Ask your health care provider what activities are safe for you. Keep all follow-up visits. This is important. Contact a health care provider if: You feel dizzy. You develop new symptoms. You vomit or feel nauseous. You have a fever. Get help right away if: You develop sudden changes in your vision. You have severe ear pain. You have new or increased weakness. You have a severe headache. Summary Hearing loss is a decreased ability to hear sounds around you. It can be temporary or permanent. Treatment will depend on the cause of your hearing loss. It may include earwax removal, medicines, or a hearing aid. Your hearing may partially or completely come  back, depending on what caused your hearing loss and how severe it is. Keep all follow-up visits. This is important. This information is not intended to replace advice given to you by your health care provider. Make sure you discuss any questions you have with your health care provider. Document Revised: 08/16/2021 Document Reviewed: 08/16/2021 Elsevier Patient Education  2024 [employer].

## 2023-06-03 NOTE — Progress Notes (Signed)
Because this visit was a virtual/telehealth visit,  certain criteria was not obtained, such a blood pressure, CBG if patient is a diabetic, and timed up and go. Any medications not marked as "taking" was not mentioned during the medication reconciliation part of the visit. Any vitals not documented were not able to be obtained due to this being a telehealth visit. Vitals documented are verbally provided by the patient.   Subjective:   Shelby Vazquez is a 72 y.o. female who presents for Medicare Annual (Subsequent) preventive examination.  Visit Complete: Virtual  I connected with  KANNA KARWOSKI on 06/03/23 by a audio enabled telemedicine application and verified that I am speaking with the correct person using two identifiers.  Patient Location: Home  Provider Location: Home Office  I discussed the limitations of evaluation and management by telemedicine. The patient expressed understanding and agreed to proceed.  Patient Medicare AWV questionnaire was completed by the patient on n/a; I have confirmed that all information answered by patient is correct and no changes since this date.  Review of Systems     Cardiac Risk Factors include: advanced age (>12men, >27 women);diabetes mellitus;dyslipidemia;hypertension     Objective:    Today's Vitals   06/03/23 1429 06/03/23 1431  BP: 110/76   Weight: 145 lb 8 oz (66 kg)   Height: 5' 4.5" (1.638 m)   PainSc:  0-No pain   Body mass index is 24.59 kg/m.     06/03/2023    2:29 PM 03/06/2023   12:05 PM 03/05/2023    1:52 PM 04/17/2022    3:28 PM 04/13/2021    3:39 PM 03/24/2020   12:17 PM 02/24/2019   10:12 AM  Advanced Directives  Does Patient Have a Medical Advance Directive? No No No No No No No  Does patient want to make changes to medical advance directive?     No - Patient declined    Would patient like information on creating a medical advance directive? No - Patient declined No - Patient declined  No - Patient declined  No -  Patient declined No - Patient declined    Current Medications (verified) Outpatient Encounter Medications as of 06/03/2023  Medication Sig   acetaminophen (TYLENOL) 500 MG tablet Take 500 mg by mouth every 6 (six) hours as needed.   aspirin 81 MG tablet Take 1 tablet (81 mg total) by mouth daily.   Calcium Carbonate-Vitamin D 600-200 MG-UNIT CAPS Take 1 capsule by mouth daily.   clidinium-chlordiazePOXIDE (LIBRAX) 5-2.5 MG capsule Take 1 capsule by mouth 2 (two) times daily at 8 am and 10 pm.   Estradiol-Estriol-Progesterone (BIEST/PROGESTERONE TD) Place 2 mLs onto the skin in the morning and at bedtime. Apply to inner thighs or arms   famotidine (PEPCID) 40 MG tablet Take 1 tablet (40 mg total) by mouth daily at bedtime.   loratadine (CLARITIN) 10 MG tablet Take 10 mg by mouth daily.   methenamine (HIPREX) 1 g tablet Take 1 tablet (1 g total) by mouth 2 (two) times daily.   metoprolol succinate (TOPROL-XL) 100 MG 24 hr tablet Take 1 tablet (100 mg total) by mouth daily. Take with or immediately following a meal.   ondansetron (ZOFRAN-ODT) 4 MG disintegrating tablet DISSOLVE 1 TABLET(4 MG) ON THE TONGUE EVERY 8 HOURS AS NEEDED FOR NAUSEA OR VOMITING   Probiotic Product (PROBIOTIC PO) Take by mouth daily. 900 billion   RABEprazole (ACIPHEX) 20 MG tablet Take 1 tablet (20 mg total) by mouth daily.  rosuvastatin (CRESTOR) 20 MG tablet Take 1 tablet (20 mg total) by mouth every evening.   sacubitril-valsartan (ENTRESTO) 24-26 MG Take 1 tablet by mouth 2 (two) times daily.   spironolactone (ALDACTONE) 25 MG tablet Take 0.5 tablets (12.5 mg total) by mouth daily.   Vitamin B Complex-C CAPS Take 1 capsule by mouth daily.   Wheat Dextrin (BENEFIBER DRINK MIX PO) Take by mouth.   No facility-administered encounter medications on file as of 06/03/2023.    Allergies (verified) Ciprofloxacin, Sulfamethoxazole-trimethoprim, Augmentin [amoxicillin-pot clavulanate], and Erythromycin   History: Past  Medical History:  Diagnosis Date   Abdominal aortic atherosclerosis (HCC) 10/2014   by CT scan   B12 deficiency    Chronic anxiety    Chronic cough    cyclical cough syndrome   Complication of anesthesia 9 yrs ago   awake and in severe pain with colonoscopy   Concussion    Diet-controlled diabetes mellitus (HCC) 02/22/2017   New dx 02/2017   GERD (gastroesophageal reflux disease)    severe leading to chronic cough and hoarseness   History of Epstein-Barr virus infection 2012   Hyperlipidemia    Hypertension    IBS (irritable bowel syndrome)    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, PB-8 probiotic, healthy diet and exercise   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   By CT 09/2017 & 09/2018 - max diameter 4cm f/u annually    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   had surgery with 5 days of chemotherapy   Osteopenia 01/2015   T -0.8 spine, -1.7 hip   Pyelonephritis due to Escherichia coli 07/16/2017   Uterine prolapse    Vocal cord paralysis, bilateral partial 2001   evaluated at [facility] - did not require surgery   Past Surgical History:  Procedure Laterality Date   BUNIONECTOMY     [place]al '2000   CARDIAC CATHETERIZATION     CATARACT EXTRACTION     right 01/2010, left may 2011   COLONOSCOPY  03/2018   diverticulosis, rpt 5 yrs ([name])   COLONOSCOPY WITH PROPOFOL N/A 03/31/2014   sessile serrated adenomas, sigmoid diverticulosis, rpt 3 yrs Bernette Redbird V, MD)   CORONARY CALCIUM SCORE-CORONARY CTA  09/2017   Calcium score 53 . Normal right dominant coronary artery system with mild onset of CAD. Aneurysmal dilation of the ascending aorta with maximum diameter diameter of 4.0-3.8 centimeter. -Recommend annual CT/MRA December 2018   DILATION AND CURETTAGE OF UTERUS  '77   for molar pregnancy excision   ESOPHAGOGASTRODUODENOSCOPY N/A 03/31/2014   mult gastric polyps, mild mucosal hemorrhages; ESOPHAGOGASTRODUODENOSCOPY (EGD);  Surgeon: Florencia Reasons, MD    ESOPHAGOGASTRODUODENOSCOPY ENDOSCOPY  01/2013   INTERSTIM IMPLANT PLACEMENT  2018   LEFT HEART CATH AND CORONARY ANGIOGRAPHY N/A 03/06/2023   Procedure: LEFT HEART CATH AND CORONARY ANGIOGRAPHY;  Surgeon: Lennette Bihari, MD;  Location: [facility] INV[facility] CV [facility];  Service: Cardiovascular;  Laterality: N/A;   toe surgery Right 09/2018   TRANSTHORACIC ECHOCARDIOGRAM  09/2017   Normal LV size and function. EF 55-60%. GR 1 DD. No regional wall motion abnormalities. No significant valvular abnormalities.   Family History  Problem Relation Age of Onset   Cancer Father        Esophagus   Heart disease Mother 64   Hyperlipidemia Mother    Hypertension Mother    Heart attack Mother 33   Cancer Maternal Aunt        breast   Heart attack Maternal Grandmother  Heart attack Maternal Grandfather    Heart disease Paternal Grandmother    Heart disease Paternal Grandfather    Hyperlipidemia Brother    Hypertension Brother    Diabetes Neg Hx    Social History   Socioeconomic History   Marital status: Married    Spouse name: Not on file   Number of children: 2   Years of education: 16   Highest education level: Bachelor's degree (e.g., BA, AB, BS)  Occupational History   Occupation: Magazine features editor: [employer]  Tobacco Use   Smoking status: Never   Smokeless tobacco: Never  Vaping Use   Vaping status: Never Used  Substance and Sexual Activity   Alcohol use: Not Currently    Comment: twice a year   Drug use: No   Sexual activity: Yes    Partners: Male  Other Topics Concern   Not on file  Social History Narrative   Married 1973. 2 daughters '74, '80; 6 grandchildren. No h/o physical or sexual abuse. 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 Edu: [employer]. Activity: stays active.Diet: good water, fruits/vegetables daily   Social Determinants of Health   Financial Resource Strain: Low Risk  (06/03/2023)    Overall Financial Resource Strain (CARDIA)    Difficulty of Paying Living Expenses: Not hard at all  Food Insecurity: No Food Insecurity (06/03/2023)   Hunger Vital Sign    Worried About Running Out of Food in the Last Year: Never true    Ran Out of Food in the Last Year: Never true  Transportation Needs: No Transportation Needs (06/03/2023)   PRAPARE - Administrator, Civil Service (Medical): No    Lack of Transportation (Non-Medical): No  Physical Activity: Sufficiently Active (06/03/2023)   Exercise Vital Sign    Days of Exercise per Week: 3 days    Minutes of Exercise per Session: 60 min  Stress: Stress Concern Present (06/03/2023)   [employer] of Occupational Health - Occupational Stress Questionnaire    Feeling of Stress : To some extent  Social Connections: Moderately Isolated (06/03/2023)   Social Connection and Isolation Panel [NHANES]    Frequency of Communication with Friends and Family: More than three times a week    Frequency of Social Gatherings with Friends and Family: More than three times a week    Attends Religious Services: Never    Database administrator or Organizations: No    Attends Engineer, structural: Never    Marital Status: Married    Tobacco Counseling Counseling given: Yes   Clinical Intake:  Pre-visit preparation completed: Yes  Pain : No/denies pain Pain Score: 0-No pain     BMI - recorded: 24.59 Nutritional Status: BMI of 19-24  Normal Nutritional Risks: None Diabetes: No  How often do you need to have someone help you when you read instructions, pamphlets, or other written materials from your doctor or pharmacy?: 1 - Never  Interpreter Needed?: No  Information entered by :: Abby Zarinah Oviatt, CMA   Activities of Daily Living    06/03/2023    2:48 PM 03/06/2023   12:11 PM  In your present state of health, do you have any difficulty performing the following activities:  Hearing? 1   Comment wears hearing aids    Vision? 0   Difficulty concentrating or making decisions? 0   Walking or climbing stairs? 0   Dressing or bathing? 0  Doing errands, shopping? 0 0  Preparing Food and eating ? N   Using the Toilet? N   In the past six months, have you accidently leaked urine? N   Do you have problems with loss of bowel control? N   Managing your Medications? N   Managing your Finances? N   Housekeeping or managing your Housekeeping? N     Patient Care Team: Eustaquio Boyden, MD as PCP - General (Family Medicine) Marykay Lex, MD as PCP - Cardiology (Cardiology) Marcelle Overlie, MD (Obstetrics and Gynecology) Storm Frisk, MD as Attending Physician (Pulmonary Disease) Bernette Redbird, MD as Referring Physician (Gastroenterology) Flo Shanks, MD as Attending Physician (Otolaryngology) Alver Sorrow, NP as Nurse Practitioner (Cardiology)  Indicate any recent Medical Services you may have received from other than [facility] providers in the past year (date may be approximate).     Assessment:   This is a routine wellness examination for Samanda.  Hearing/Vision screen Hearing Screening - Comments:: Patient wears hearing aids due to hearing difficulties.   Vision Screening - Comments:: Wears rx glasses - up to date with routine eye exams  With Dr. Nile Riggs. Referral placed for patient to establish with new eye doctor due to Dr. Nile Riggs retiring.   Dietary issues and exercise activities discussed:     Goals Addressed             This Visit's Progress    Patient Stated       "Get over my heart issue"        Depression Screen    06/03/2023    2:36 PM 04/03/2023   11:08 AM 11/08/2022    9:12 AM 04/17/2022    3:16 PM 04/17/2022    3:15 PM 02/20/2022   12:19 PM 12/24/2021   12:43 PM  PHQ 2/9 Scores  PHQ - 2 Score 0 3 0 2 0 6 2  PHQ- 9 Score 1 15 4 9  26 11     Fall Risk    06/03/2023    2:48 PM 06/02/2023   11:06 AM 05/30/2023   10:23 AM 05/28/2023   10:46 AM 05/26/2023    10:21 AM  Fall Risk   Falls in the past year? 0 0 0 0 0  Number falls in past yr: 0 0 0 0 0  Injury with Fall? 0 0 0 0 0  Risk for fall due to : No Fall Risks Impaired balance/gait Impaired balance/gait Impaired balance/gait Impaired balance/gait  Follow up Falls prevention discussed Falls evaluation completed Falls evaluation completed Falls evaluation completed Falls evaluation completed    MEDICARE RISK AT HOME:  Medicare Risk at Home - 06/03/23 1444     Any stairs in or around the home? Yes    If so, are there any without handrails? No    Home free of loose throw rugs in walkways, pet beds, electrical cords, etc? Yes    Adequate lighting in your home to reduce risk of falls? Yes    Life alert? No    Use of a cane, walker or w/c? No    Grab bars in the bathroom? No    Shower chair or bench in shower? Yes    Elevated toilet seat or a handicapped toilet? Yes             TIMED UP AND GO:  Was the test performed?  No    Cognitive Function:    04/13/2021    3:44 PM 03/24/2020   12:25  PM 02/24/2019   10:20 AM  MMSE - Mini Mental State Exam  Not completed: Refused Refused   Orientation to time   5  Orientation to Place   5  Registration   3  Attention/ Calculation   0  Recall   3  Language- name 2 objects   0  Language- repeat   1  Language- follow 3 step command   0  Language- read & follow direction   0  Write a sentence   0  Copy design   0  Total score   17        06/03/2023    2:35 PM 04/17/2022    3:28 PM  6CIT Screen  What Year? 0 points 0 points  What month? 0 points 0 points  What time? 0 points 0 points  Count back from 20 0 points 0 points  Months in reverse 0 points 0 points  Repeat phrase 0 points 0 points  Total Score 0 points 0 points    Immunizations Immunization History  Administered Date(s) Administered   Fluad Quad(high Dose 65+) 08/19/2019, 07/28/2020, 07/03/2021, 07/08/2022   Influenza Split 08/25/2009, 07/30/2010, 08/21/2012    Influenza Whole 07/25/2009, 08/29/2014   Influenza, High Dose Seasonal PF 09/12/2017, 08/20/2018   Influenza,inj,Quad PF,6+ Mos 07/23/2015, 07/23/2016   Influenza,inj,Quad PF,6-35 Mos 07/24/2013   PFIZER(Purple Top)SARS-COV-2 Vaccination 11/10/2019, 12/01/2019, 07/19/2020   Pneumococcal Conjugate-13 02/12/2016   Pneumococcal Polysaccharide-23 01/20/2013, 08/20/2018   Td 06/09/2010   Tdap 07/23/2015, 08/14/2016   Zoster Recombinant(Shingrix) 10/04/2020, 12/05/2020   Zoster, Live 07/30/2010, 04/28/2013    TDAP status: Up to date  Flu Vaccine status: Due, Education has been provided regarding the importance of this vaccine. Advised may receive this vaccine at local pharmacy or Health Dept. Aware to provide a copy of the vaccination record if obtained from local pharmacy or Health Dept. Verbalized acceptance and understanding.  Pneumococcal vaccine status: Up to date  Covid-19 vaccine status: Information provided on how to obtain vaccines.   Qualifies for Shingles Vaccine? Yes   Zostavax completed No   Shingrix Completed?: Yes  Screening Tests Health Maintenance  Topic Date Due   COVID-19 Vaccine (4 - 2023-24 season) 06/21/2022   INFLUENZA VACCINE  05/22/2023   Diabetic kidney evaluation - Urine ACR  07/04/2023   HEMOGLOBIN A1C  09/06/2023   MAMMOGRAM  10/08/2023   OPHTHALMOLOGY EXAM  10/29/2023   FOOT EXAM  11/09/2023   Medicare Annual Wellness (AWV)  03/06/2024   Diabetic kidney evaluation - eGFR measurement  03/12/2024   PAP-Cervical Cytology Screening  10/08/2025   DTaP/Tdap/Td (4 - Td or Tdap) 08/14/2026   Colonoscopy  04/17/2028   Pneumonia Vaccine 21+ Years old  Completed   DEXA SCAN  Completed   Hepatitis C Screening  Completed   Zoster Vaccines- Shingrix  Completed   HPV VACCINES  Aged Out    Health Maintenance  Health Maintenance Due  Topic Date Due   COVID-19 Vaccine (4 - 2023-24 season) 06/21/2022   INFLUENZA VACCINE  05/22/2023   Diabetic kidney  evaluation - Urine ACR  07/04/2023    Colorectal cancer screening: Type of screening: Colonoscopy. Completed 04/17/2018. Repeat every 10 years  Mammogram status: Completed 10/07/2022. Repeat every year  Bone Density status: Completed 05/17/2021. Results reflect: Bone density results: OSTEOPENIA. Repeat every 2-5 years.  Lung Cancer Screening: (Low Dose CT Chest recommended if Age 39-80 years, 20 pack-year currently smoking OR have quit w/in 15years.) does not qualify.   Additional Screening:  Hepatitis C Screening: does not qualify; Completed 02/07/2016  Vision Screening: Recommended annual ophthalmology exams for early detection of glaucoma and other disorders of the eye. Is the patient up to date with their annual eye exam?  Yes  Who is the provider or what is the name of the office in which the patient attends annual eye exams? Dr. Nile Riggs If pt is not established with a provider, would they like to be referred to a provider to establish care? Yes .   Dental Screening: Recommended annual dental exams for proper oral hygiene  Diabetic Foot Exam: Diabetic Foot Exam: Completed 11/08/2022  Community Resource Referral / Chronic Care Management: CRR required this visit?  No   CCM required this visit?  No     Plan:     I have personally reviewed and noted the following in the patient's chart:   Medical and social history Use of alcohol, tobacco or illicit drugs  Current medications and supplements including opioid prescriptions. Patient is not currently taking opioid prescriptions. Functional ability and status Nutritional status Physical activity Advanced directives List of other physicians Hospitalizations, surgeries, and ER visits in previous 12 months Vitals Screenings to include cognitive, depression, and falls Referrals and appointments  In addition, I have reviewed and discussed with patient certain preventive protocols, quality metrics, and best practice  recommendations. A written personalized care plan for preventive services as well as general preventive health recommendations were provided to patient.     Jordan Hawks Morganna Styles, CMA   06/03/2023   After Visit Summary: (MyChart) Due to this being a telephonic visit, the after visit summary with patients personalized plan was offered to patient via MyChart   Nurse Notes: Referral placed for eye doctor

## 2023-06-04 ENCOUNTER — Encounter (HOSPITAL_COMMUNITY): Admission: RE | Admit: 2023-06-04 | Payer: Medicare PPO | Source: Ambulatory Visit

## 2023-06-04 ENCOUNTER — Encounter: Payer: Self-pay | Admitting: *Deleted

## 2023-06-04 DIAGNOSIS — I214 Non-ST elevation (NSTEMI) myocardial infarction: Secondary | ICD-10-CM | POA: Diagnosis not present

## 2023-06-04 DIAGNOSIS — I5181 Takotsubo syndrome: Secondary | ICD-10-CM | POA: Diagnosis not present

## 2023-06-05 ENCOUNTER — Encounter (INDEPENDENT_AMBULATORY_CARE_PROVIDER_SITE_OTHER): Payer: Self-pay

## 2023-06-06 ENCOUNTER — Encounter (HOSPITAL_COMMUNITY): Payer: Medicare PPO

## 2023-06-09 ENCOUNTER — Encounter (HOSPITAL_COMMUNITY): Payer: Medicare PPO

## 2023-06-11 ENCOUNTER — Encounter (HOSPITAL_COMMUNITY): Payer: Medicare PPO

## 2023-06-13 ENCOUNTER — Encounter (HOSPITAL_COMMUNITY)
Admission: RE | Admit: 2023-06-13 | Discharge: 2023-06-13 | Disposition: A | Discharge status: Home / Self Care | Payer: Medicare PPO | Source: Ambulatory Visit | Attending: Cardiology | Admitting: Cardiology

## 2023-06-13 DIAGNOSIS — I5181 Takotsubo syndrome: Secondary | ICD-10-CM | POA: Diagnosis not present

## 2023-06-13 DIAGNOSIS — I214 Non-ST elevation (NSTEMI) myocardial infarction: Secondary | ICD-10-CM

## 2023-06-16 ENCOUNTER — Encounter (HOSPITAL_COMMUNITY)
Admission: RE | Admit: 2023-06-16 | Discharge: 2023-06-16 | Disposition: A | Discharge status: Home / Self Care | Payer: Medicare PPO | Source: Ambulatory Visit | Attending: Cardiology | Admitting: Cardiology

## 2023-06-16 ENCOUNTER — Other Ambulatory Visit (HOSPITAL_BASED_OUTPATIENT_CLINIC_OR_DEPARTMENT_OTHER): Payer: Self-pay

## 2023-06-16 DIAGNOSIS — I5181 Takotsubo syndrome: Secondary | ICD-10-CM | POA: Diagnosis not present

## 2023-06-16 DIAGNOSIS — I214 Non-ST elevation (NSTEMI) myocardial infarction: Secondary | ICD-10-CM | POA: Diagnosis not present

## 2023-06-16 NOTE — Progress Notes (Signed)
Cardiac Individual Treatment Plan  Patient Details  Name: Shelby Vazquez MRN: [id number] Date of Birth: April 04, 1951 Referring Provider:   Flowsheet Row INTENSIVE CARDIAC REHAB ORIENT from 04/03/2023 in [facility]  Referring Provider Shelby. Bryan Lemma MD       Initial Encounter Date:  Flowsheet Row INTENSIVE CARDIAC REHAB ORIENT from 04/03/2023 in [facility], [facility], [facility]  Date 04/03/23       Visit Diagnosis: 03/05/23 NSTEMI  03/05/23 Takotsubo cardiomyopathy  Patient's Home Medications on Admission:  Current Outpatient Medications:    acetaminophen (TYLENOL) 500 MG tablet, Take 500 mg by mouth every 6 (six) hours as needed., Disp: , Rfl:    aspirin 81 MG tablet, Take 1 tablet (81 mg total) by mouth daily., Disp: 30 tablet, Rfl:    Calcium Carbonate-Vitamin D 600-200 MG-UNIT CAPS, Take 1 capsule by mouth daily., Disp: , Rfl:    clidinium-chlordiazePOXIDE (LIBRAX) 5-2.5 MG capsule, Take 1 capsule by mouth 2 (two) times daily at 8 am and 10 pm., Disp: 180 capsule, Rfl: 3   Estradiol-Estriol-Progesterone (BIEST/PROGESTERONE TD), Place 2 mLs onto the skin in the morning and at bedtime. Apply to inner thighs or arms, Disp: , Rfl:    famotidine (PEPCID) 40 MG tablet, Take 1 tablet (40 mg total) by mouth daily at bedtime., Disp: 90 tablet, Rfl: 3   loratadine (CLARITIN) 10 MG tablet, Take 10 mg by mouth daily., Disp: , Rfl:    methenamine (HIPREX) 1 g tablet, Take 1 tablet (1 g total) by mouth 2 (two) times daily., Disp: 180 tablet, Rfl: 3   metoprolol succinate (TOPROL-XL) 100 MG 24 hr tablet, Take 1 tablet (100 mg total) by mouth daily. Take with or immediately following a meal., Disp: 90 tablet, Rfl: 3   ondansetron (ZOFRAN-ODT) 4 MG disintegrating tablet, DISSOLVE 1 TABLET(4 MG) ON THE TONGUE EVERY 8 HOURS AS NEEDED FOR NAUSEA OR VOMITING, Disp: 30 tablet, Rfl: 00   Probiotic Product  (PROBIOTIC PO), Take by mouth daily. 900 billion, Disp: , Rfl:    RABEprazole (ACIPHEX) 20 MG tablet, Take 1 tablet (20 mg total) by mouth daily., Disp: 90 tablet, Rfl: 3   rosuvastatin (CRESTOR) 20 MG tablet, Take 1 tablet (20 mg total) by mouth every evening., Disp: 90 tablet, Rfl: 3   sacubitril-valsartan (ENTRESTO) 24-26 MG, Take 1 tablet by mouth 2 (two) times daily., Disp: 180 tablet, Rfl: 3   spironolactone (ALDACTONE) 25 MG tablet, Take 0.5 tablets (12.5 mg total) by mouth daily., Disp: 45 tablet, Rfl: 3   Vitamin B Complex-C CAPS, Take 1 capsule by mouth daily., Disp: , Rfl:    Wheat Dextrin (BENEFIBER DRINK MIX PO), Take by mouth., Disp: , Rfl:   Past Medical History: Past Medical History:  Diagnosis Date   Abdominal aortic atherosclerosis (HCC) 10/2014   by CT scan   B12 deficiency    Chronic anxiety    Chronic cough    cyclical cough syndrome   Complication of anesthesia 9 yrs ago   awake and in severe pain with colonoscopy   Concussion    Diet-controlled diabetes mellitus (HCC) 02/22/2017   New dx 02/2017   GERD (gastroesophageal reflux disease)    severe leading to chronic cough and hoarseness   History of Epstein-Barr virus infection 2012   Hyperlipidemia    Hypertension    IBS (irritable bowel syndrome)    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, PB-8 probiotic,  healthy diet and exercise   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   By CT 09/2017 & 09/2018 - max diameter 4cm f/u annually    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   had surgery with 5 days of chemotherapy   Osteopenia 01/2015   T -0.8 spine, -1.7 hip   Pyelonephritis due to Escherichia coli 07/16/2017   Uterine prolapse    Vocal cord paralysis, bilateral partial 2001   evaluated at [facility] - did not require surgery    Tobacco Use: Social History   Tobacco Use  Smoking Status Never  Smokeless Tobacco Never    Labs: Review Flowsheet  More data exists      Latest Ref Rng & Units  04/17/2021 03/19/2022 07/03/2022 11/08/2022 03/06/2023  Labs for ITP Cardiac and Pulmonary Rehab  Cholestrol 0 - 200 mg/dL 425  - 956  - 387   LDL (calc) 0 - 99 mg/dL 64  - 53  - 71   Direct LDL 0 - 99 mg/dL - 69  - - -  HDL-C >56 mg/dL 43.32  - 95.18  - 37   Trlycerides <150 mg/dL 841.6  - 606.3  - 88   Hemoglobin A1c 4.8 - 5.6 % 6.6  - 6.4  6.1  6.2     Details            Capillary Blood Glucose: Lab Results  Component Value Date   GLUCAP 81 04/09/2023   GLUCAP 125 (H) 04/09/2023   GLUCAP 136 (H) 07/20/2017   GLUCAP 116 (H) 07/20/2017   GLUCAP 108 (H) 07/19/2017     Exercise Target Goals: Exercise Program Goal: Individual exercise prescription set using results from initial 6 min walk test and THRR while considering  patient's activity barriers and safety.   Exercise Prescription Goal: Initial exercise prescription builds to 30-45 minutes a day of aerobic activity, 2-3 days per week.  Home exercise guidelines will be given to patient during program as part of exercise prescription that the participant will acknowledge.  Activity Barriers & Risk Stratification:  Activity Barriers & Cardiac Risk Stratification - 04/03/23 1205       Activity Barriers & Cardiac Risk Stratification   Activity Barriers Deconditioning;Balance Concerns;Shortness of Breath    Cardiac Risk Stratification High             6 Minute Walk:  6 Minute Walk     Row Name 04/03/23 1146         6 Minute Walk   Phase Initial     Distance 1731 feet     Walk Time 6 minutes     # of Rest Breaks 0     MPH 3.28     METS 3.69     RPE 11     Perceived Dyspnea  1     VO2 Peak 12.91     Symptoms Yes (comment)     Comments Pre walk test patient SOBx1 SaO2 98% possible anxiety. Nurse aware, lung sounds clear. Resolved with rest. Patient felt good on the walk test, SOB x1 post, resolved with rest     Resting HR 64 bpm     Resting BP 112/70     Resting Oxygen Saturation  98 %     Exercise Oxygen  Saturation  during 6 min walk 98 %     Max Ex. HR 97 bpm     Max Ex. BP 156/62     2 Minute Post BP 134/68  Oxygen Initial Assessment:   Oxygen Re-Evaluation:   Oxygen Discharge (Final Oxygen Re-Evaluation):   Initial Exercise Prescription:  Initial Exercise Prescription - 04/03/23 1200       Date of Initial Exercise RX and Referring Provider   Date 04/03/23    Referring Provider Shelby. Bryan Lemma MD    Expected Discharge Date 06/13/23      NuStep   Level 2    SPM 80    Minutes 15    METs 2.2      Track   Laps 22    Minutes 15    METs 2.9      Prescription Details   Frequency (times per week) 3    Duration Progress to 30 minutes of continuous aerobic without signs/symptoms of physical distress      Intensity   THRR 40-80% of Max Heartrate 59-118    Ratings of Perceived Exertion 11-13    Perceived Dyspnea 0-4      Progression   Progression Continue progressive overload as per policy without signs/symptoms or physical distress.      Resistance Training   Training Prescription Yes    Weight 2    Reps 10-15             Perform Capillary Blood Glucose checks as needed.  Exercise Prescription Changes:   Exercise Prescription Changes     Row Name 04/09/23 1024 04/21/23 1024 05/05/23 1020 05/19/23 1025 06/02/23 1028     Response to Exercise   Blood Pressure (Admit) 126/74 128/72 104/60 124/78 140/78   Blood Pressure (Exercise) 142/70 120/68 122/68 142/62 132/80   Blood Pressure (Exit) 112/74 112/72 118/80 110/68 116/70   Heart Rate (Admit) 79 bpm 89 bpm 70 bpm 75 bpm 82 bpm   Heart Rate (Exercise) 105 bpm 106 bpm 120 bpm 114 bpm 121 bpm   Heart Rate (Exit) 76 bpm 75 bpm 72 bpm 84 bpm 84 bpm   Rating of Perceived Exertion (Exercise) 11 11 11 11 11    Symptoms None None None None None   Comments Off to a good start with exercise. Reviewed METs -- Reviewed METs --   Duration Continue with 30 min of aerobic exercise without  signs/symptoms of physical distress. Continue with 30 min of aerobic exercise without signs/symptoms of physical distress. Continue with 30 min of aerobic exercise without signs/symptoms of physical distress. Continue with 30 min of aerobic exercise without signs/symptoms of physical distress. Continue with 30 min of aerobic exercise without signs/symptoms of physical distress.   Intensity THRR unchanged THRR unchanged THRR unchanged THRR unchanged THRR unchanged     Progression   Progression Continue to progress workloads to maintain intensity without signs/symptoms of physical distress. Continue to progress workloads to maintain intensity without signs/symptoms of physical distress. Continue to progress workloads to maintain intensity without signs/symptoms of physical distress. Continue to progress workloads to maintain intensity without signs/symptoms of physical distress. Continue to progress workloads to maintain intensity without signs/symptoms of physical distress.   Average METs 2.7 3.2 3 3.4 3.2     Resistance Training   Training Prescription No  Relaxation day, no weights No  Relaxation day, no weights Yes Yes Yes   Weight -- -- 2 lbs 2 lbs 4 lbs   Reps -- -- 10-15 10-15 10-15   Time -- -- 10 Minutes 10 Minutes 10 Minutes     Interval Training   Interval Training No No No No No     NuStep   Level 2  2 2 3 3    SPM 71 102 109 115 105   Minutes 15 15 15 15 15    METs 2.2 2.8 2.7 3.2 2.7     Track   Laps 18 21 18 21 21    Minutes 15 15 15 15 15    METs 3.3 3.68 3.3 3.68 3.68     Home Exercise Plan   Plans to continue exercise at -- -- Home (comment)  Walking Home (comment)  Walking Home (comment)  Walking   Frequency -- -- Add 4 additional days to program exercise sessions. Add 4 additional days to program exercise sessions. Add 4 additional days to program exercise sessions.   Initial Home Exercises Provided -- -- 04/23/23 04/23/23 04/23/23    Row Name 06/16/23 1025              Response to Exercise   Blood Pressure (Admit) 128/78       Blood Pressure (Exercise) 110/78       Blood Pressure (Exit) 106/66       Heart Rate (Admit) 76 bpm       Heart Rate (Exercise) 111 bpm       Heart Rate (Exit) 85 bpm       Rating of Perceived Exertion (Exercise) 11       Symptoms None       Comments Reviewed METs and goals with Shelby Vazquez.       Duration Continue with 30 min of aerobic exercise without signs/symptoms of physical distress.       Intensity THRR unchanged         Progression   Progression Continue to progress workloads to maintain intensity without signs/symptoms of physical distress.       Average METs 3.3         Resistance Training   Training Prescription Yes       Weight 3 lbs       Reps 10-15       Time 10 Minutes         Interval Training   Interval Training No         NuStep   Level 3       SPM 109       Minutes 15       METs 3         Track   Laps 20       Minutes 15       METs 3.55         Home Exercise Plan   Plans to continue exercise at Home (comment)  Walking       Frequency Add 4 additional days to program exercise sessions.       Initial Home Exercises Provided 04/23/23                Exercise Comments:   Exercise Comments     Row Name 04/09/23 1129 04/21/23 1111 04/23/23 1059 05/07/23 1100 05/19/23 1111   Exercise Comments Shelby Vazquez tolerated 1st session of exercise well without symptoms. Oriented to warm-up/cool-down stretching routine. Reviewed METs with patient. Reviewed home exercise guidelines and goals with patient. Reviewed METs and goals with Shelby Vazquez. Reviewed METs with Shelby Vazquez Name 06/04/23 1048 06/16/23 1113         Exercise Comments Reviewed METs and goals with patient. Reviewed METs and goals with patient.               Exercise Goals and Review:   Exercise  Goals     Row Name 04/03/23 1208             Exercise Goals   Increase Physical Activity Yes       Intervention Provide advice, education,  support and counseling about physical activity/exercise needs.;Develop an individualized exercise prescription for aerobic and resistive training based on initial evaluation findings, risk stratification, comorbidities and participant's personal goals.       Expected Outcomes Short Term: Attend rehab on a regular basis to increase amount of physical activity.;Long Term: Exercising regularly at least 3-5 days a week.;Long Term: Add in home exercise to make exercise part of routine and to increase amount of physical activity.       Increase Strength and Stamina Yes       Intervention Provide advice, education, support and counseling about physical activity/exercise needs.;Develop an individualized exercise prescription for aerobic and resistive training based on initial evaluation findings, risk stratification, comorbidities and participant's personal goals.       Expected Outcomes Short Term: Increase workloads from initial exercise prescription for resistance, speed, and METs.;Short Term: Perform resistance training exercises routinely during rehab and add in resistance training at home;Long Term: Improve cardiorespiratory fitness, muscular endurance and strength as measured by increased METs and functional capacity ( )       Able to understand and use rate of perceived exertion (RPE) scale Yes       Intervention Provide education and explanation on how to use RPE scale       Expected Outcomes Short Term: Able to use RPE daily in rehab to express subjective intensity level;Long Term:  Able to use RPE to guide intensity level when exercising independently       Knowledge and understanding of Target Heart Rate Range (THRR) Yes       Intervention Provide education and explanation of THRR including how the numbers were predicted and where they are located for reference       Expected Outcomes Short Term: Able to state/look up THRR;Long Term: Able to use THRR to govern intensity when exercising  independently;Short Term: Able to use daily as guideline for intensity in rehab       Understanding of Exercise Prescription Yes       Intervention Provide education, explanation, and written materials on patient's individual exercise prescription       Expected Outcomes Short Term: Able to explain program exercise prescription;Long Term: Able to explain home exercise prescription to exercise independently                Exercise Goals Re-Evaluation :  Exercise Goals Re-Evaluation     Row Name 04/09/23 1129 04/23/23 1059 05/07/23 1100 06/04/23 1048 06/16/23 1113     Exercise Goal Re-Evaluation   Exercise Goals Review Increase Physical Activity;Increase Strength and Stamina;Able to understand and use rate of perceived exertion (RPE) scale Increase Physical Activity;Increase Strength and Stamina;Able to understand and use rate of perceived exertion (RPE) scale;Able to check pulse independently;Knowledge and understanding of Target Heart Rate Range (THRR);Understanding of Exercise Prescription Increase Physical Activity;Increase Strength and Stamina;Able to understand and use rate of perceived exertion (RPE) scale;Able to check pulse independently;Knowledge and understanding of Target Heart Rate Range (THRR);Understanding of Exercise Prescription Increase Physical Activity;Increase Strength and Stamina;Able to understand and use rate of perceived exertion (RPE) scale;Able to check pulse independently;Knowledge and understanding of Target Heart Rate Range (THRR);Understanding of Exercise Prescription Increase Physical Activity;Increase Strength and Stamina;Able to understand and use rate of perceived exertion (RPE) scale;Able  to check pulse independently;Knowledge and understanding of Target Heart Rate Range (THRR);Understanding of Exercise Prescription   Comments Shelby Vazquez was able to understand and use RPE scale appropriately. Reviewed exercise prescription with Shelby Vazquez. She is walking and occassionally  experiences chest discomfort afterwards. Discussed decreasing duration from 18 minutes BID to 15 minutes once or twice/day depending on how she's feeling. She has a smart watch that may be able to check her pulse. Shelby Vazquez states she's stretching, walking outdoors, and light laps/walking in the pool after walk. Given stretching hand out with the warm-up and cool-down stretches from cardiac rehab. Shelby Vazquez feels she doign well with her home exercise routine and is even pushing it a little. She has increased her walking duration to 20 minutes daily. She will be on vacation next week and plans to stay active. Shelby Vazquez is doing well with her home exercise routine. She has a variety of exercise activity that she participates in daily. She walks 15-20 minutes daily at 19-20 minute mile pace, she stretches pre and post exercise 15-20 minutes, she exercises in her pool with a noodle 15-20 minutes, [facility]. [facility] completion of the program she will continue this routine possibly adding an aerobics class 2 days/week in anticipation of closing her pool, and [facility]. Her walking trail has some incline, which is still tough for her.   Expected Outcomes Progress workloads as tolerated to help achieve personal health and fitness goals. Shelby Vazquez will walk 15 minutes 1-2 x's/day 2-4 days/week as tolerated in addition to exercise at cardiac rehab to achieve 150 minutes aerobic exercise/week. Continue home exercise routine in addition to exercise at cardiac rehab. Continue walking at home in addition to exercise at cardiac rehab. Shelby Vazquez will continue her home exercise routine in addition to exercise at cardiac rehab.            Discharge Exercise Prescription (Final Exercise Prescription Changes):  Exercise Prescription Changes - 06/16/23 1025       Response to Exercise   Blood Pressure (Admit) 128/78    Blood Pressure (Exercise) 110/78    Blood Pressure (Exit) 106/66     Heart Rate (Admit) 76 bpm    Heart Rate (Exercise) 111 bpm    Heart Rate (Exit) 85 bpm    Rating of Perceived Exertion (Exercise) 11    Symptoms None    Comments Reviewed METs and goals with Shelby Vazquez.    Duration Continue with 30 min of aerobic exercise without signs/symptoms of physical distress.    Intensity THRR unchanged      Progression   Progression Continue to progress workloads to maintain intensity without signs/symptoms of physical distress.    Average METs 3.3      Resistance Training   Training Prescription Yes    Weight 3 lbs    Reps 10-15    Time 10 Minutes      Interval Training   Interval Training No      NuStep   Level 3    SPM 109    Minutes 15    METs 3      Track   Laps 20    Minutes 15    METs 3.55      Home Exercise Plan   Plans to continue exercise at Home (comment)   Walking   Frequency Add 4 additional days to program exercise sessions.    Initial Home Exercises Provided 04/23/23  Nutrition:  Target Goals: Understanding of nutrition guidelines, daily intake of sodium 1500mg , cholesterol 200mg , calories 30% from fat and 7% or less from saturated fats, daily to have 5 or more servings of fruits and vegetables.  Biometrics:  Pre Biometrics - 04/03/23 1144       Pre Biometrics   Waist Circumference 37 inches    Hip Circumference 40.5 inches    Waist to Hip Ratio 0.91 %    Triceps Skinfold 19 mm    % Body Fat 37.3 %    Grip Strength 24 kg    Flexibility 12.25 in    Single Leg Stand 4.5 seconds              Nutrition Therapy Plan and Nutrition Goals:  Nutrition Therapy & Goals - 06/09/23 1135       Nutrition Therapy   Diet Heart Healthy Diet    Drug/Food Interactions Statins/Certain Fruits      Personal Nutrition Goals   Nutrition Goal Patient to improve diet quality by using the plate method as a guide for meal planning to include lean protein/plant protein, fruits, vegetables, whole grains, nonfat dairy as part  of a well-balanced diet.   goal in action.   Personal Goal #2 Patient to identify strategies for reducing cardiovascular risk by attending the [facility] education and nutrition series weekly.   goal in action.   Personal Goal #3 Patient to reduce sodium to 1500mg  per day   goal in action.   Comments Goals in action. Shelby Vazquez continues to attend the [facility] education and nutrition series. Shelby Vazquez reports making many changes including reduced sodium intake and decreased saturated fat/meat intake. She continues to prioritize fiber intake. Shelby Vazquez is down 6.8# since starting with our program. Patient will benefit from participation in intensive cardiac rehab for nutrition, exercise, and lifestyle modification.      Intervention Plan   Intervention Prescribe, educate and counsel regarding individualized specific dietary modifications aiming towards targeted core components such as weight, hypertension, lipid management, diabetes, heart failure and other comorbidities.;Nutrition handout(s) given to patient.    Expected Outcomes Short Term Goal: Understand basic principles of dietary content, such as calories, fat, sodium, cholesterol and nutrients.;Long Term Goal: Adherence to prescribed nutrition plan.             Nutrition Assessments:  Nutrition Assessments - 04/14/23 1006       Rate Your Plate Scores   Pre Score 58            MEDIFICTS Score Key: ?70 Need to make dietary changes  40-70 Heart Healthy Diet ? 40 Therapeutic Level Cholesterol Diet   Flowsheet Row INTENSIVE CARDIAC REHAB from 04/11/2023 in [facility] for [facility] V[facility], & Lung [facility]  Picture Your Plate Total Score on Admission 58      Picture Your Plate Scores: <40 Unhealthy dietary pattern with much room for improvement. 41-50 Dietary pattern unlikely to meet recommendations for good health and room for improvement. 51-60 More healthful dietary pattern, with some room for improvement.  >60 Healthy  dietary pattern, although there may be some specific behaviors that could be improved.    Nutrition Goals Re-Evaluation:  Nutrition Goals Re-Evaluation     Row Name 04/11/23 1130 05/09/23 1029 06/09/23 1135         Goals   Current Weight 154 lb 12.2 oz (70.2 kg) 154 lb 5.2 oz (70 kg) 147 lb 11.3 oz (67 kg)     Comment A1c 6.2, lipids  WNL-HDL 37, TSH 4.87, lipoprotein A WNL no new labs; most recent labs A1c 6.2, lipids WNL-HDL 37, TSH 4.87, lipoprotein A WNL no new labs; most recent labs  A1c 6.2, lipids WNL-HDL 37, TSH 4.87, lipoprotein A WNL     Expected Outcome Shelby Vazquez reports making many changes including reduced sodium intake and decreased saturated fat/meat intake. Patient will benefit from participation in intensive cardiac rehab for nutrition, exercise, and lifestyle modification. Goals in action. Shelby Vazquez continues to attend the [facility] education and nutrition series. Shelby Vazquez reports making many changes including reduced sodium intake and decreased saturated fat/meat intake. She has maintained her weight since starting with our program. Patient will benefit from participation in intensive cardiac rehab for nutrition, exercise, and lifestyle modification. Goals in action. Shelby Vazquez continues to attend the [facility] education and nutrition series. Shelby Vazquez reports making many changes including reduced sodium intake and decreased saturated fat/meat intake. She continues to prioritize fiber intake. Shelby Vazquez is down 6.8# since starting with our program. Patient will benefit from participation in intensive cardiac rehab for nutrition, exercise, and lifestyle modification.              Nutrition Goals Re-Evaluation:  Nutrition Goals Re-Evaluation     Row Name 04/11/23 1130 05/09/23 1029 06/09/23 1135         Goals   Current Weight 154 lb 12.2 oz (70.2 kg) 154 lb 5.2 oz (70 kg) 147 lb 11.3 oz (67 kg)     Comment A1c 6.2, lipids WNL-HDL 37, TSH 4.87, lipoprotein A WNL no new labs; most recent labs A1c 6.2, lipids  WNL-HDL 37, TSH 4.87, lipoprotein A WNL no new labs; most recent labs  A1c 6.2, lipids WNL-HDL 37, TSH 4.87, lipoprotein A WNL     Expected Outcome Shelby Vazquez reports making many changes including reduced sodium intake and decreased saturated fat/meat intake. Patient will benefit from participation in intensive cardiac rehab for nutrition, exercise, and lifestyle modification. Goals in action. Shelby Vazquez continues to attend the [facility] education and nutrition series. Shelby Vazquez reports making many changes including reduced sodium intake and decreased saturated fat/meat intake. She has maintained her weight since starting with our program. Patient will benefit from participation in intensive cardiac rehab for nutrition, exercise, and lifestyle modification. Goals in action. Shelby Vazquez continues to attend the [facility] education and nutrition series. Shelby Vazquez reports making many changes including reduced sodium intake and decreased saturated fat/meat intake. She continues to prioritize fiber intake. Shelby Vazquez is down 6.8# since starting with our program. Patient will benefit from participation in intensive cardiac rehab for nutrition, exercise, and lifestyle modification.              Nutrition Goals Discharge (Final Nutrition Goals Re-Evaluation):  Nutrition Goals Re-Evaluation - 06/09/23 1135       Goals   Current Weight 147 lb 11.3 oz (67 kg)    Comment no new labs; most recent labs  A1c 6.2, lipids WNL-HDL 37, TSH 4.87, lipoprotein A WNL    Expected Outcome Goals in action. Shelby Vazquez continues to attend the [facility]in education and nutrition series. Shelby Vazquez reports making many changes including reduced sodium intake and decreased saturated fat/meat intake. She continues to prioritize fiber intake. Shelby Vazquez is down 6.8# since starting with our program. Patient will benefit from participation in intensive cardiac rehab for nutrition, exercise, and lifestyle modification.             Psychosocial: Target Goals: Acknowledge presence or absence of  significant depression and/or stress, maximize coping skills, provide positive support system. Participant  is able to verbalize types and ability to use techniques and skills needed for reducing stress and depression.  Initial Review & Psychosocial Screening:  Initial Psych Review & Screening - 04/03/23 1101       Initial Review   Current issues with Current Depression;Current Stress Concerns    Source of Stress Concerns Chronic Illness;Family    Comments Shelby Vazquez says she is currently depressed  and angry due to her recent cardiac event and diagnosis. Shelby Vazquez is also concerened about her husband as he may need colon surgery this summer. Shelby Vazquez is not currently taking an antidepressant and is not interested counselling. i offered to get an appointment with Shelby Vazquez Shelby Vazquez she declines at this time and says that he is aware of her suituation. Shelby Vazquez said her sister commited suicide 23 years ago she would never take her own life as she know it would cause alot of pain and hardship for her family.      Family Dynamics   Good Support System? Yes   Shelby Vazquez has her husband, children and grandchildren for support     Barriers   Psychosocial barriers to participate in program The patient should benefit from training in stress management and relaxation.      Screening Interventions   Interventions Encouraged to exercise;Provide feedback about the scores to participant;To provide support and resources with identified psychosocial needs    Expected Outcomes Long Term Goal: Stressors or current issues are controlled or eliminated.;Short Term goal: Identification and review with participant of any Quality of Life or Depression concerns found by scoring the questionnaire.;Long Term goal: The participant improves quality of Life and PHQ9 Scores as seen by post scores and/or verbalization of changes             Quality of Life Scores:  Quality of Life - 04/03/23 1209       Quality of Life   Select Quality of  Life      Quality of Life Scores   Health/Function Pre 11.2 %    Socioeconomic Pre 27.86 %    Psych/Spiritual Pre 6.86 %    Family Pre 21.6 %    GLOBAL Pre 15.26 %            Scores of 19 and below usually indicate a poorer quality of life in these areas.  A difference of  2-3 points is a clinically meaningful difference.  A difference of 2-3 points in the total score of the Quality of Life Index has been associated with significant improvement in overall quality of life, self-image, physical symptoms, and general health in studies assessing change in quality of life.  PHQ-9: Review Flowsheet  More data exists      06/03/2023 04/03/2023 11/08/2022 04/17/2022 02/20/2022  Depression screen PHQ 2/9  Decreased Interest 0 0 0 1 0 3  Down, Depressed, Hopeless 0 3 0 1 3  PHQ - 2 Score 0 3 0 2 0 6  Altered sleeping 0 1 0 3 3  Tired, decreased energy 0 3 3 2 3   Change in appetite 0 1 0 0 3  Feeling bad or failure about yourself  0 3 0 1 3  Trouble concentrating 1 3 1 1 3   Moving slowly or fidgety/restless 0 1 0 0 3  Suicidal thoughts 0 0 0 0 2  PHQ-9 Score 1 15 4 9 26   Difficult doing work/chores Somewhat difficult Somewhat difficult Somewhat difficult Somewhat difficult Somewhat difficult    Details  Multiple values from one day are sorted in reverse-chronological order        Interpretation of Total Score  Total Score Depression Severity:  1-4 = Minimal depression, 5-9 = Mild depression, 10-14 = Moderate depression, 15-19 = Moderately severe depression, 20-27 = Severe depression   Psychosocial Evaluation and Intervention:   Psychosocial Re-Evaluation:  Psychosocial Re-Evaluation     Row Name 04/10/23 0751 04/22/23 1423 05/20/23 1419 06/16/23 1721       Psychosocial Re-Evaluation   Current issues with Current Depression;Current Stress Concerns Current Depression;Current Stress Concerns Current Depression;Current Stress Concerns Current Depression;Current Stress  Concerns    Comments Shelby Vazquez started intensive cardiac rehab on 04/09/23. Shelby Vazquez said she felt less depressed on her first day of exercise as she was feeling apprehensive about starting intensive cardiac rehab. Will review quality of life and PHQ-9 in the upcoming week Quality of life questionnaire reviewed with patient and was forwarded to Shelby Vazquez, Shelby Vazquez. Shelby Vazquez said she felt less depressed since she has been participating in cardiac rehab Shelby Vazquez has not voiced any increased  concerns or stressors during exercise at cardiac rehab Shelby Vazquez has not voiced any increased  concerns or stressors during exercise at cardiac rehab. Shelby Vazquez will complete cardiac rehab on 06/25/23    Expected Outcomes -- -- Shelby Vazquez will have decreased stress and depression upon completion of cardiac rehab Shelby Vazquez says her stress level has improved since participating in cardiac rehab.    Interventions Encouraged to attend Cardiac Rehabilitation for the exercise;Stress management education;Relaxation education Encouraged to attend Cardiac Rehabilitation for the exercise;Stress management education;Relaxation education Encouraged to attend Cardiac Rehabilitation for the exercise;Stress management education;Relaxation education Encouraged to attend Cardiac Rehabilitation for the exercise;Stress management education;Relaxation education    Continue Psychosocial Services  Follow up required by staff Follow up required by staff No Follow up required No Follow up required      Initial Review   Source of Stress Concerns Chronic Illness;Family Chronic Illness;Family Chronic Illness;Family Chronic Illness    Comments Will continue to monitor and offer support as needed Will continue to monitor and offer support as needed Will continue to monitor and offer support as needed Will continue to monitor and offer support as needed             Psychosocial Discharge (Final Psychosocial Re-Evaluation):  Psychosocial Re-Evaluation - 06/16/23 1721        Psychosocial Re-Evaluation   Current issues with Current Depression;Current Stress Concerns    Comments Shelby Vazquez has not voiced any increased  concerns or stressors during exercise at cardiac rehab. Shelby Vazquez will complete cardiac rehab on 06/25/23    Expected Outcomes Shelby Vazquez says her stress level has improved since participating in cardiac rehab.    Interventions Encouraged to attend Cardiac Rehabilitation for the exercise;Stress management education;Relaxation education    Continue Psychosocial Services  No Follow up required      Initial Review   Source of Stress Concerns Chronic Illness    Comments Will continue to monitor and offer support as needed             Vocational Rehabilitation: Provide vocational rehab assistance to qualifying candidates.   Vocational Rehab Evaluation & Intervention:  Vocational Rehab - 04/03/23 1107       Initial Vocational Rehab Evaluation & Intervention   Assessment shows need for Vocational Rehabilitation No   Shelby Vazquez is retired and does not need vocational rehab at this time            Education: Education  Goals: Education classes will be provided on a weekly basis, covering required topics. Participant will state understanding/return demonstration of topics presented.    Education     Row Name 04/09/23 1500     Education   Cardiac Education Topics Pritikin   Orthoptist   Educator Dietitian   Weekly Topic [employer] Desserts   Instruction Review Code 1- Verbalizes Understanding   Class Start Time 1145   Class Stop Time 1235   Class Time Calculation (min) 50 min    Row Name 04/11/23 1300     Education   Cardiac Education Topics Pritikin   Licensed conveyancer Nutrition   Nutrition Other  label reading   Instruction Review Code 1- Verbalizes Understanding   Class Start Time 1147   Class Stop Time 1226   Class Time Calculation (min) 39 min    Row Name 04/14/23  1300     Education   Cardiac Education Topics Pritikin   [employer]     Workshops   Educator Exercise Physiologist   Select Exercise   Exercise Workshop Exercise Basics: Building Your Action Plan   Instruction Review Code 1- Verbalizes Understanding   Class Start Time 1145   Class Stop Time 1230   Class Time Calculation (min) 45 min    Row Name 04/16/23 1300     Education   Cardiac Education Topics Pritikin   Orthoptist   Educator Dietitian   Weekly Topic Tasty Appetizers and Snacks   Instruction Review Code 1- Verbalizes Understanding   Class Start Time 1143   Class Stop Time 1215   Class Time Calculation (min) 32 min    Row Name 04/18/23 1400     Education   Cardiac Education Topics Pritikin   Nurse, children's Exercise Physiologist   Select Nutrition   Nutrition Nutrition Action Plan   Instruction Review Code 1- Verbalizes Understanding   Class Start Time 1145   Class Stop Time 1218   Class Time Calculation (min) 33 min    Row Name 04/21/23 1200     Education   Cardiac Education Topics Pritikin   Actor Dietitian   Select Nutrition   Nutrition Calorie Density   Instruction Review Code 1- Verbalizes Understanding   Class Start Time 1145   Class Stop Time 1223   Class Time Calculation (min) 38 min    Row Name 04/23/23 1300     Education   Cardiac Education Topics Pritikin   Customer service manager   Weekly Topic Efficiency Cooking - Meals in a Snap   Instruction Review Code 1- Verbalizes Understanding   Class Start Time 1140   Class Stop Time 1220   Class Time Calculation (min) 40 min    Row Name 04/25/23 1200     Education   Cardiac Education Topics Pritikin   Psychologist, forensic Exercise Education   Exercise Education Move It!    Instruction Review Code 1- Verbalizes Understanding   Class Start Time 1150   Class Stop Time 1225   Class Time Calculation (min) 35 min  Row Name 04/28/23 1000     Education   Cardiac Education Topics Pritikin   Select Workshops     Workshops   Educator Exercise Physiologist   Select Psychosocial   Psychosocial Workshop Focused Goals, Sustainable Changes   Instruction Review Code 1- Verbalizes Understanding   Class Start Time 1135   Class Stop Time 1210   Class Time Calculation (min) 35 min    Row Name 04/30/23 1200     Education   Cardiac Education Topics Pritikin   Orthoptist   Educator Dietitian   Weekly Topic One-Pot Wonders   Instruction Review Code 1- Verbalizes Understanding   Class Start Time 1140   Class Stop Time 1211   Class Time Calculation (min) 31 min    Row Name 05/02/23 1200     Education   Cardiac Education Topics Pritikin   Psychologist, forensic General Education   General Education Hypertension and Heart Disease   Instruction Review Code 1- Verbalizes Understanding   Class Start Time 1141   Class Stop Time 1217   Class Time Calculation (min) 36 min    Row Name 05/05/23 1100     Education   Cardiac Education Topics Pritikin   Psychologist, forensic Exercise Education   Exercise Education Biomechanial Limitations   Instruction Review Code 1- Verbalizes Understanding   Class Start Time 1150   Class Stop Time 1230   Class Time Calculation (min) 40 min    Row Name 05/07/23 1300     Education   Cardiac Education Topics Pritikin   Customer service manager   Weekly Topic Comforting Weekend Breakfasts   Instruction Review Code 1- Verbalizes Understanding   Class Start Time 1143   Class Stop Time 1216   Class Time Calculation (min) 33 min    Row Name  05/12/23 1600     Education   Cardiac Education Topics Pritikin   Nurse, children's   Educator Dietitian   Select Nutrition   Nutrition Facts on Fat   Instruction Review Code 1- Verbalizes Understanding   Class Start Time 1150   Class Stop Time 1222   Class Time Calculation (min) 32 min    Row Name 05/14/23 1300     Education   Cardiac Education Topics Pritikin   Orthoptist   Educator Dietitian   Weekly Topic Fast Evening Meals   Instruction Review Code 1- Verbalizes Understanding   Class Start Time 1138   Class Stop Time 1216   Class Time Calculation (min) 38 min    Row Name 05/16/23 1300     Education   Cardiac Education Topics Pritikin   Nurse, children's   Educator Dietitian   Select Nutrition   Nutrition Vitamins and Minerals   Instruction Review Code 1- Verbalizes Understanding   Class Start Time 1145   Class Stop Time 1230   Class Time Calculation (min) 45 min    Row Name 05/19/23 1100     Education   Cardiac Education Topics Pritikin   Geographical information systems officer Psychosocial  Psychosocial Workshop Healthy Sleep for a Healthy Heart   Instruction Review Code 1- Verbalizes Understanding   Class Start Time 1148   Class Stop Time 1235   Class Time Calculation (min) 47 min    Row Name 05/21/23 1600     Education   Cardiac Education Topics Pritikin   Customer service manager   Weekly Topic International Cuisine- Spotlight on the [employer] Zones   Instruction Review Code 1- Verbalizes Understanding   Class Start Time 1145   Class Stop Time 1226   Class Time Calculation (min) 41 min    Row Name 05/23/23 1200     Education   Cardiac Education Topics Pritikin   Psychologist, forensic Exercise Education   Exercise Education Improving Performance    Instruction Review Code 1- Verbalizes Understanding   Class Start Time 1150   Class Stop Time 1230   Class Time Calculation (min) 40 min    Row Name 05/26/23 1300     Education   Cardiac Education Topics Pritikin   Glass blower/designer Nutrition   Nutrition Workshop Fueling a Forensic psychologist   Instruction Review Code 1- TEFL teacher Understanding   Class Start Time 1145   Class Stop Time 1234   Class Time Calculation (min) 49 min    Row Name 05/28/23 1300     Education   Cardiac Education Topics Pritikin   Customer service manager   Weekly Topic Simple Sides and Sauces   Instruction Review Code 1- Verbalizes Understanding   Class Start Time 1145   Class Stop Time 1226   Class Time Calculation (min) 41 min    Row Name 05/30/23 1100     Education   Cardiac Education Topics Pritikin   Nurse, children's Exercise Physiologist   Select Psychosocial   Psychosocial How Our Thoughts Can Heal Our Hearts   Instruction Review Code 1- Verbalizes Understanding   Class Start Time 1150   Class Stop Time 1230   Class Time Calculation (min) 40 min    Row Name 06/02/23 1300     Education   Cardiac Education Topics Pritikin   Select Workshops     Workshops   Educator Exercise Physiologist   Select Exercise   Exercise Workshop Managing Heart Disease: Your Path to a Healthier Heart   Instruction Review Code 1- Verbalizes Understanding   Class Start Time 1147   Class Stop Time 1256   Class Time Calculation (min) 69 min    Row Name 06/04/23 1500     Education   Cardiac Education Topics Pritikin   Orthoptist   Educator Dietitian   Weekly Topic Powerhouse Plant-Based Proteins   Instruction Review Code 1- Verbalizes Understanding   Class Start Time 1145   Class Stop Time 1228   Class Time Calculation (min) 43 min    Row Name 06/13/23  1200     Education   Cardiac Education Topics Pritikin   Hospital doctor Education   General Education Heart Disease Risk Reduction   Instruction Review Code 1- Verbalizes Understanding  Class Start Time 1146   Class Stop Time 1223   Class Time Calculation (min) 37 min    Row Name 06/16/23 1100     Education   Cardiac Education Topics Pritikin   Select Core Videos     Core Videos   Educator Dietitian   Select Nutrition   Nutrition Overview of the Pritikin Eating Plan   Instruction Review Code 1- Verbalizes Understanding   Class Start Time 1145   Class Stop Time 1225   Class Time Calculation (min) 40 min            Core Videos: Exercise    Move It!  Clinical staff conducted group or individual video education with verbal and written material and guidebook.  Patient learns the recommended Pritikin exercise program. Exercise with the goal of living a long, healthy life. Some of the health benefits of exercise include controlled diabetes, healthier blood pressure levels, improved cholesterol levels, improved heart and lung capacity, improved sleep, and better body composition. Everyone should speak with their doctor before starting or changing an exercise routine.  Biomechanical Limitations Clinical staff conducted group or individual video education with verbal and written material and guidebook.  Patient learns how biomechanical limitations can impact exercise and how we can mitigate and possibly overcome limitations to have an impactful and balanced exercise routine.  Body Composition Clinical staff conducted group or individual video education with verbal and written material and guidebook.  Patient learns that body composition (ratio of muscle mass to fat mass) is a key component to assessing overall fitness, rather than body weight alone. Increased fat mass, especially visceral belly fat, can put  [place] at increased risk for metabolic syndrome, type 2 diabetes, heart disease, and even death. It is recommended to combine diet and exercise (cardiovascular and resistance training) to improve your body composition. Seek guidance from your physician and exercise physiologist before implementing an exercise routine.  Exercise Action Plan Clinical staff conducted group or individual video education with verbal and written material and guidebook.  Patient learns the recommended strategies to achieve and enjoy long-term exercise adherence, including variety, self-motivation, self-efficacy, and positive decision making. Benefits of exercise include fitness, good health, weight management, more energy, better sleep, less stress, and overall well-being.  Medical   Heart Disease Risk Reduction Clinical staff conducted group or individual video education with verbal and written material and guidebook.  Patient learns our heart is our most vital organ as it circulates oxygen, nutrients, white blood cells, and hormones throughout the entire body, and carries waste away. Data supports a plant-based eating plan like the Pritikin Program for its effectiveness in slowing progression of and reversing heart disease. The video provides a number of recommendations to address heart disease.   Metabolic Syndrome and Belly Fat  Clinical staff conducted group or individual video education with verbal and written material and guidebook.  Patient learns what metabolic syndrome is, how it leads to heart disease, and how one can reverse it and keep it from coming back. You have metabolic syndrome if you have 3 of the following 5 criteria: abdominal obesity, high blood pressure, high triglycerides, low HDL cholesterol, and high blood sugar.  Hypertension and Heart Disease Clinical staff conducted group or individual video education with verbal and written material and guidebook.  Patient learns that high blood pressure, or  hypertension, is very common in the [place]. Hypertension is largely due to excessive salt intake, but other important risk factors include being overweight, physical inactivity,  drinking too much alcohol, smoking, and not eating enough potassium from fruits and vegetables. High blood pressure is a leading risk factor for heart attack, stroke, congestive heart failure, dementia, kidney failure, and premature death. Long-term effects of excessive salt intake include stiffening of the arteries and thickening of heart muscle and organ damage. Recommendations include ways to reduce hypertension and the risk of heart disease.  Diseases of Our Time - Focusing on Diabetes Clinical staff conducted group or individual video education with verbal and written material and guidebook.  Patient learns why the best way to stop diseases of our time is prevention, through food and other lifestyle changes. Medicine (such as prescription pills and surgeries) is often only a Band-Aid on the problem, not a long-term solution. Most common diseases of our time include obesity, type 2 diabetes, hypertension, heart disease, and cancer. The Pritikin Program is recommended and has been proven to help reduce, reverse, and/or prevent the damaging effects of metabolic syndrome.  Nutrition   Overview of the Pritikin Eating Plan  Clinical staff conducted group or individual video education with verbal and written material and guidebook.  Patient learns about the Pritikin Eating Plan for disease risk reduction. The Pritikin Eating Plan emphasizes a wide variety of unrefined, minimally-processed carbohydrates, like fruits, vegetables, whole grains, and legumes. Go, Caution, and Stop food choices are explained. Plant-based and lean animal proteins are emphasized. Rationale provided for low sodium intake for blood pressure control, low added sugars for blood sugar stabilization, and low added fats and oils for coronary artery disease  risk reduction and weight management.  Calorie Density  Clinical staff conducted group or individual video education with verbal and written material and guidebook.  Patient learns about calorie density and how it impacts the Pritikin Eating Plan. Knowing the characteristics of the food you choose will help you decide whether those foods will lead to weight gain or weight loss, and whether you want to consume more or less of them. Weight loss is usually a side effect of the Pritikin Eating Plan because of its focus on low calorie-dense foods.  Label Reading  Clinical staff conducted group or individual video education with verbal and written material and guidebook.  Patient learns about the Pritikin recommended label reading guidelines and corresponding recommendations regarding calorie density, added sugars, sodium content, and whole grains.  Dining Out - Part 1  Clinical staff conducted group or individual video education with verbal and written material and guidebook.  Patient learns that restaurant meals can be sabotaging because they can be so high in calories, fat, sodium, and/or sugar. Patient learns recommended strategies on how to positively address this and avoid unhealthy pitfalls.  Facts on Fats  Clinical staff conducted group or individual video education with verbal and written material and guidebook.  Patient learns that lifestyle modifications can be just as effective, if not more so, as many medications for lowering your risk of heart disease. A Pritikin lifestyle can help to reduce your risk of inflammation and atherosclerosis (cholesterol build-up, or plaque, in the artery walls). Lifestyle interventions such as dietary choices and physical activity address the cause of atherosclerosis. A review of the types of fats and their impact on blood cholesterol levels, along with dietary recommendations to reduce fat intake is also included.  Nutrition Action Plan  Clinical staff  conducted group or individual video education with verbal and written material and guidebook.  Patient learns how to incorporate Pritikin recommendations into their lifestyle. Recommendations include planning and keeping  personal health goals in mind as an important part of their success.  Healthy Mind-Set    Healthy Minds, Bodies, Hearts  Clinical staff conducted group or individual video education with verbal and written material and guidebook.  Patient learns how to identify when they are stressed. Video will discuss the impact of that stress, as well as the many benefits of stress management. Patient will also be introduced to stress management techniques. The way we think, act, and feel has an impact on our hearts.  How Our Thoughts Can Heal Our Hearts  Clinical staff conducted group or individual video education with verbal and written material and guidebook.  Patient learns that negative thoughts can cause depression and anxiety. This can result in negative lifestyle behavior and serious health problems. Cognitive behavioral therapy is an effective method to help control our thoughts in order to change and improve our emotional outlook.  Additional Videos:  Exercise    Improving Performance  Clinical staff conducted group or individual video education with verbal and written material and guidebook.  Patient learns to use a non-linear approach by alternating intensity levels and lengths of time spent exercising to help burn more calories and lose more body fat. Cardiovascular exercise helps improve heart health, metabolism, hormonal balance, blood sugar control, and recovery from fatigue. Resistance training improves strength, endurance, balance, coordination, reaction time, metabolism, and muscle mass. Flexibility exercise improves circulation, posture, and balance. Seek guidance from your physician and exercise physiologist before implementing an exercise routine and learn your capabilities  and proper form for all exercise.  Introduction to Yoga  Clinical staff conducted group or individual video education with verbal and written material and guidebook.  Patient learns about yoga, a discipline of the coming together of mind, breath, and body. The benefits of yoga include improved flexibility, improved range of motion, better posture and core strength, increased lung function, weight loss, and positive self-image. Yoga's heart health benefits include lowered blood pressure, healthier heart rate, decreased cholesterol and triglyceride levels, improved immune function, and reduced stress. Seek guidance from your physician and exercise physiologist before implementing an exercise routine and learn your capabilities and proper form for all exercise.  Medical   Aging: Enhancing Your Quality of Life  Clinical staff conducted group or individual video education with verbal and written material and guidebook.  Patient learns key strategies and recommendations to stay in good physical health and enhance quality of life, such as prevention strategies, having an advocate, securing a Health Care Proxy and Power of Attorney, and keeping a list of medications and system for tracking them. It also discusses how to avoid risk for bone loss.  Biology of Weight Control  Clinical staff conducted group or individual video education with verbal and written material and guidebook.  Patient learns that weight gain occurs because we consume more calories than we burn (eating more, moving less). Even if your body weight is normal, you may have higher ratios of fat compared to muscle mass. Too much body fat puts you at increased risk for cardiovascular disease, heart attack, stroke, type 2 diabetes, and obesity-related cancers. In addition to exercise, following the Pritikin Eating Plan can help reduce your risk.  Decoding Lab Results  Clinical staff conducted group or individual video education with verbal and  written material and guidebook.  Patient learns that lab test reflects one measurement whose values change over time and are influenced by many factors, including medication, stress, sleep, exercise, food, hydration, pre-existing medical conditions, and more.  It is recommended to use the knowledge from this video to become more involved with your lab results and evaluate your numbers to speak with your doctor.   Diseases of Our Time - Overview  Clinical staff conducted group or individual video education with verbal and written material and guidebook.  Patient learns that according to the CDC, 50% to 70% of chronic diseases (such as obesity, type 2 diabetes, elevated lipids, hypertension, and heart disease) are avoidable through lifestyle improvements including healthier food choices, listening to satiety cues, and increased physical activity.  Sleep Disorders Clinical staff conducted group or individual video education with verbal and written material and guidebook.  Patient learns how good quality and duration of sleep are important to overall health and well-being. Patient also learns about sleep disorders and how they impact health along with recommendations to address them, including discussing with a physician.  Nutrition  Dining Out - Part 2 Clinical staff conducted group or individual video education with verbal and written material and guidebook.  Patient learns how to plan ahead and communicate in order to maximize their dining experience in a healthy and nutritious manner. Included are recommended food choices based on the type of restaurant the patient is visiting.   Fueling a Banker conducted group or individual video education with verbal and written material and guidebook.  There is a strong connection between our food choices and our health. Diseases like obesity and type 2 diabetes are very prevalent and are in large-part due to lifestyle choices. The  Pritikin Eating Plan provides plenty of food and hunger-curbing satisfaction. It is easy to follow, affordable, and helps reduce health risks.  Menu Workshop  Clinical staff conducted group or individual video education with verbal and written material and guidebook.  Patient learns that restaurant meals can sabotage health goals because they are often packed with calories, fat, sodium, and sugar. Recommendations include strategies to plan ahead and to communicate with the manager, chef, or server to help order a healthier meal.  Planning Your Eating Strategy  Clinical staff conducted group or individual video education with verbal and written material and guidebook.  Patient learns about the Pritikin Eating Plan and its benefit of reducing the risk of disease. The Pritikin Eating Plan does not focus on calories. Instead, it emphasizes high-quality, nutrient-rich foods. By knowing the characteristics of the foods, we choose, we can determine their calorie density and make informed decisions.  Targeting Your Nutrition Priorities  Clinical staff conducted group or individual video education with verbal and written material and guidebook.  Patient learns that lifestyle habits have a tremendous impact on disease risk and progression. This video provides eating and physical activity recommendations based on your personal health goals, such as reducing LDL cholesterol, losing weight, preventing or controlling type 2 diabetes, and reducing high blood pressure.  Vitamins and Minerals  Clinical staff conducted group or individual video education with verbal and written material and guidebook.  Patient learns different ways to obtain key vitamins and minerals, including through a recommended healthy diet. It is important to discuss all supplements you take with your doctor.   Healthy Mind-Set    Smoking Cessation  Clinical staff conducted group or individual video education with verbal and written  material and guidebook.  Patient learns that cigarette smoking and tobacco addiction pose a serious health risk which affects millions of people. Stopping smoking will significantly reduce the risk of heart disease, lung disease, and many forms of cancer. Recommended  strategies for quitting are covered, including working with your doctor to develop a successful plan.  Culinary   Becoming a Set designer conducted group or individual video education with verbal and written material and guidebook.  Patient learns that cooking at home can be healthy, cost-effective, quick, and puts them in control. Keys to cooking healthy recipes will include looking at your recipe, assessing your equipment needs, planning ahead, making it simple, choosing cost-effective seasonal ingredients, and limiting the use of added fats, salts, and sugars.  Cooking - Breakfast and Snacks  Clinical staff conducted group or individual video education with verbal and written material and guidebook.  Patient learns how important breakfast is to satiety and nutrition through the entire day. Recommendations include key foods to eat during breakfast to help stabilize blood sugar levels and to prevent overeating at meals later in the day. Planning ahead is also a key component.  Cooking - Educational psychologist conducted group or individual video education with verbal and written material and guidebook.  Patient learns eating strategies to improve overall health, including an approach to cook more at home. Recommendations include thinking of animal protein as a side on your plate rather than center stage and focusing instead on lower calorie dense options like vegetables, fruits, whole grains, and plant-based proteins, such as beans. Making sauces in large quantities to freeze for later and leaving the skin on your vegetables are also recommended to maximize your experience.  Cooking - Healthy Salads and  Dressing Clinical staff conducted group or individual video education with verbal and written material and guidebook.  Patient learns that vegetables, fruits, whole grains, and legumes are the foundations of the Pritikin Eating Plan. Recommendations include how to incorporate each of these in flavorful and healthy salads, and how to create homemade salad dressings. Proper handling of ingredients is also covered. Cooking - Soups and [employer] - Soups and Desserts Clinical staff conducted group or individual video education with verbal and written material and guidebook.  Patient learns that Pritikin soups and desserts make for easy, nutritious, and delicious snacks and meal components that are low in sodium, fat, sugar, and calorie density, while high in vitamins, minerals, and filling fiber. Recommendations include simple and healthy ideas for soups and desserts.   Overview     The Pritikin Solution Program Overview Clinical staff conducted group or individual video education with verbal and written material and guidebook.  Patient learns that the results of the Pritikin Program have been documented in more than 100 articles published in peer-reviewed journals, and the benefits include reducing risk factors for (and, in some cases, even reversing) high cholesterol, high blood pressure, type 2 diabetes, obesity, and more! An overview of the three key pillars of the Pritikin Program will be covered: eating well, doing regular exercise, and having a healthy mind-set.  WORKSHOPS  Exercise: Exercise Basics: Building Your Action Plan Clinical staff led group instruction and group discussion with PowerPoint presentation and patient guidebook. To enhance the learning environment the use of posters, models and videos may be added. At the conclusion of this workshop, patients will comprehend the difference between physical activity and exercise, as well as the benefits of incorporating both, into  their routine. Patients will understand the FITT (Frequency, Intensity, Time, and Type) principle and how to use it to build an exercise action plan. In addition, safety concerns and other considerations for exercise and cardiac rehab will be addressed by the presenter.  The purpose of this lesson is to promote a comprehensive and effective weekly exercise routine in order to improve patients' overall level of fitness.   Managing Heart Disease: Your Path to a Healthier Heart Clinical staff led group instruction and group discussion with PowerPoint presentation and patient guidebook. To enhance the learning environment the use of posters, models and videos may be added.At the conclusion of this workshop, patients will understand the anatomy and physiology of the heart. Additionally, they will understand how Pritikin's three pillars impact the risk factors, the progression, and the management of heart disease.  The purpose of this lesson is to provide a high-level overview of the heart, heart disease, and how the Pritikin lifestyle positively impacts risk factors.  Exercise Biomechanics Clinical staff led group instruction and group discussion with PowerPoint presentation and patient guidebook. To enhance the learning environment the use of posters, models and videos may be added. Patients will learn how the structural parts of their bodies function and how these functions impact their daily activities, movement, and exercise. Patients will learn how to promote a neutral spine, learn how to manage pain, and identify ways to improve their physical movement in order to promote healthy living. The purpose of this lesson is to expose patients to common physical limitations that impact physical activity. Participants will learn practical ways to adapt and manage aches and pains, and to minimize their effect on regular exercise. Patients will learn how to maintain good posture while sitting, walking, and  lifting.  Balance Training and Fall Prevention  Clinical staff led group instruction and group discussion with PowerPoint presentation and patient guidebook. To enhance the learning environment the use of posters, models and videos may be added. At the conclusion of this workshop, patients will understand the importance of their sensorimotor skills (vision, proprioception, and the vestibular system) in maintaining their ability to balance as they age. Patients will apply a variety of balancing exercises that are appropriate for their current level of function. Patients will understand the common causes for poor balance, possible solutions to these problems, and ways to modify their physical environment in order to minimize their fall risk. The purpose of this lesson is to teach patients about the importance of maintaining balance as they age and ways to minimize their risk of falling.  WORKSHOPS   Nutrition:  Fueling a Ship broker led group instruction and group discussion with PowerPoint presentation and patient guidebook. To enhance the learning environment the use of posters, models and videos may be added. Patients will review the foundational principles of the Pritikin Eating Plan and understand what constitutes a serving size in each of the food groups. Patients will also learn Pritikin-friendly foods that are better choices when away from home and review make-ahead meal and snack options. Calorie density will be reviewed and applied to three nutrition priorities: weight maintenance, weight loss, and weight gain. The purpose of this lesson is to reinforce (in a group setting) the key concepts around what patients are recommended to eat and how to apply these guidelines when away from home by planning and selecting Pritikin-friendly options. Patients will understand how calorie density may be adjusted for different weight management goals.  Mindful Eating  Clinical staff led  group instruction and group discussion with PowerPoint presentation and patient guidebook. To enhance the learning environment the use of posters, models and videos may be added. Patients will briefly review the concepts of the Pritikin Eating Plan and the importance of low-calorie dense  foods. The concept of mindful eating will be introduced as well as the importance of paying attention to internal hunger signals. Triggers for non-hunger eating and techniques for dealing with triggers will be explored. The purpose of this lesson is to provide patients with the opportunity to review the basic principles of the Pritikin Eating Plan, discuss the value of eating mindfully and how to measure internal cues of hunger and fullness using the Hunger Scale. Patients will also discuss reasons for non-hunger eating and learn strategies to use for controlling emotional eating.  Targeting Your Nutrition Priorities Clinical staff led group instruction and group discussion with PowerPoint presentation and patient guidebook. To enhance the learning environment the use of posters, models and videos may be added. Patients will learn how to determine their genetic susceptibility to disease by reviewing their family history. Patients will gain insight into the importance of diet as part of an overall healthy lifestyle in mitigating the impact of genetics and other environmental insults. The purpose of this lesson is to provide patients with the opportunity to assess their personal nutrition priorities by looking at their family history, their own health history and current risk factors. Patients will also be able to discuss ways of prioritizing and modifying the Pritikin Eating Plan for their highest risk areas  Menu  Clinical staff led group instruction and group discussion with PowerPoint presentation and patient guidebook. To enhance the learning environment the use of posters, models and videos may be added. Using menus  brought in from [employer], or printed from [employer], patients will apply the Pritikin dining out guidelines that were presented in the [employer] video. Patients will also be able to practice these guidelines in a variety of provided scenarios. The purpose of this lesson is to provide patients with the opportunity to practice hands-on learning of the Pritikin Dining Out guidelines with actual menus and practice scenarios.  Label Reading Clinical staff led group instruction and group discussion with PowerPoint presentation and patient guidebook. To enhance the learning environment the use of posters, models and videos may be added. Patients will review and discuss the Pritikin label reading guidelines presented in Pritikin's Label Reading Educational series video. Using fool labels brought in from local grocery stores and markets, patients will apply the label reading guidelines and determine if the packaged food meet the Pritikin guidelines. The purpose of this lesson is to provide patients with the opportunity to review, discuss, and practice hands-on learning of the Pritikin Label Reading guidelines with actual packaged food labels. Cooking School  Pritikin's [employer] are designed to teach patients ways to prepare quick, simple, and affordable recipes at home. The importance of nutrition's role in chronic disease risk reduction is reflected in its emphasis in the overall Pritikin program. By learning how to prepare essential core Pritikin Eating Plan recipes, patients will increase control over what they eat; be able to customize the flavor of foods without the use of added salt, sugar, or fat; and improve the quality of the food they consume. By learning a set of core recipes which are easily assembled, quickly prepared, and affordable, patients are more likely to prepare more healthy foods at home. These workshops focus on convenient breakfasts, simple  entres, side dishes, and desserts which can be prepared with minimal effort and are consistent with nutrition recommendations for cardiovascular risk reduction. Cooking [employer] are taught by a Armed forces logistics/support/administrative officer (RD) who has been trained by the [employer]  team. The chef or RD has a clear understanding of the importance of minimizing - if not completely eliminating - added fat, sugar, and sodium in recipes. Throughout the series of Cooking School Workshop sessions, patients will learn about healthy ingredients and efficient methods of cooking to build confidence in their capability to prepare    Cooking School weekly topics:  Adding Flavor- Sodium-Free  Fast and Healthy Breakfasts  Powerhouse Plant-Based Proteins  Satisfying Salads and Dressings  Simple Sides and Sauces  International Cuisine-Spotlight on the [employer] Zones  Delicious Desserts  Savory Soups  [employer] - Meals in a Astronomer Appetizers and Snacks  Comforting Weekend Breakfasts  One-Pot Wonders   Fast Evening Meals  Landscape architect Your Pritikin Plate  WORKSHOPS   Healthy Mindset (Psychosocial):  Focused Goals, Sustainable Changes Clinical staff led group instruction and group discussion with PowerPoint presentation and patient guidebook. To enhance the learning environment the use of posters, models and videos may be added. Patients will be able to apply effective goal setting strategies to establish at least one personal goal, and then take consistent, meaningful action toward that goal. They will learn to identify common barriers to achieving personal goals and develop strategies to overcome them. Patients will also gain an understanding of how our mind-set can impact our ability to achieve goals and the importance of cultivating a positive and growth-oriented mind-set. The purpose of this lesson is to provide patients with a deeper understanding of how to set and  achieve personal goals, as well as the tools and strategies needed to overcome common obstacles which may arise along the way.  From Head to Heart: The Power of a Healthy Outlook  Clinical staff led group instruction and group discussion with PowerPoint presentation and patient guidebook. To enhance the learning environment the use of posters, models and videos may be added. Patients will be able to recognize and describe the impact of emotions and mood on physical health. They will discover the importance of self-care and explore self-care practices which may work for them. Patients will also learn how to utilize the 4 C's to cultivate a healthier outlook and better manage stress and challenges. The purpose of this lesson is to demonstrate to patients how a healthy outlook is an essential part of maintaining good health, especially as they continue their cardiac rehab journey.  Healthy Sleep for a Healthy Heart Clinical staff led group instruction and group discussion with PowerPoint presentation and patient guidebook. To enhance the learning environment the use of posters, models and videos may be added. At the conclusion of this workshop, patients will be able to demonstrate knowledge of the importance of sleep to overall health, well-being, and quality of life. They will understand the symptoms of, and treatments for, common sleep disorders. Patients will also be able to identify daytime and nighttime behaviors which impact sleep, and they will be able to apply these tools to help manage sleep-related challenges. The purpose of this lesson is to provide patients with a general overview of sleep and outline the importance of quality sleep. Patients will learn about a few of the most common sleep disorders. Patients will also be introduced to the concept of "sleep hygiene," and discover ways to self-manage certain sleeping problems through simple daily behavior changes. Finally, the workshop will motivate  patients by clarifying the links between quality sleep and their goals of heart-healthy living.   Recognizing and Reducing Stress Clinical staff led group instruction and group discussion  with PowerPoint presentation and patient guidebook. To enhance the learning environment the use of posters, models and videos may be added. At the conclusion of this workshop, patients will be able to understand the types of stress reactions, differentiate between acute and chronic stress, and recognize the impact that chronic stress has on their health. They will also be able to apply different coping mechanisms, such as reframing negative self-talk. Patients will have the opportunity to practice a variety of stress management techniques, such as deep abdominal breathing, progressive muscle relaxation, and/or guided imagery.  The purpose of this lesson is to educate patients on the role of stress in their lives and to provide healthy techniques for coping with it.  Learning Barriers/Preferences:  Learning Barriers/Preferences - 04/03/23 1213       Learning Barriers/Preferences   Learning Barriers Exercise Concerns;Hearing   HOH and balance concerns   Learning Preferences Computer/Internet;Group Instruction;Individual Instruction;Skilled Demonstration;Video;Written Material;Pictoral             Education Topics:  Knowledge Questionnaire Score:  Knowledge Questionnaire Score - 04/03/23 1214       Knowledge Questionnaire Score   Pre Score 21/24             Core Components/Risk Factors/Patient Goals at Admission:  Personal Goals and Risk Factors at Admission - 04/03/23 1216       Core Components/Risk Factors/Patient Goals on Admission    Weight Management Yes;Weight Loss    Intervention Weight Management: Develop a combined nutrition and exercise program designed to reach desired caloric intake, while maintaining appropriate intake of nutrient and fiber, sodium and fats, and appropriate energy  expenditure required for the weight goal.;Weight Management: Provide education and appropriate resources to help participant work on and attain dietary goals.;Weight Management/Obesity: Establish reasonable short term and long term weight goals.;Obesity: Provide education and appropriate resources to help participant work on and attain dietary goals.    Admit Weight 154 lb 8.7 oz (70.1 kg)    Hypertension Yes    Intervention Provide education on lifestyle modifcations including regular physical activity/exercise, weight management, moderate sodium restriction and increased consumption of fresh fruit, vegetables, and low fat dairy, alcohol moderation, and smoking cessation.;Monitor prescription use compliance.    Expected Outcomes Short Term: Continued assessment and intervention until BP is < 140/47mm HG in hypertensive participants. < 130/41mm HG in hypertensive participants with diabetes, heart failure or chronic kidney disease.;Long Term: Maintenance of blood pressure at goal levels.    Lipids Yes    Intervention Provide education and support for participant on nutrition & aerobic/resistive exercise along with prescribed medications to achieve LDL 70mg , HDL >40mg .    Expected Outcomes Short Term: Participant states understanding of desired cholesterol values and is compliant with medications prescribed. Participant is following exercise prescription and nutrition guidelines.;Long Term: Cholesterol controlled with medications as prescribed, with individualized exercise RX and with personalized nutrition plan. Value goals: LDL < 70mg , HDL > 40 mg.    Stress Yes    Intervention Offer individual and/or small group education and counseling on adjustment to heart disease, stress management and health-related lifestyle change. Teach and support self-help strategies.;Refer participants experiencing significant psychosocial distress to appropriate mental health specialists for further evaluation and treatment.  When possible, include family members and significant others in education/counseling sessions.    Expected Outcomes Short Term: Participant demonstrates changes in health-related behavior, relaxation and other stress management skills, ability to obtain effective social support, and compliance with psychotropic medications if prescribed.;Long Term: Emotional wellbeing is indicated  by absence of clinically significant psychosocial distress or social isolation.    Personal Goal Other Yes    Personal Goal short term: balance training, know limits to exercise Long term: tuned up to keep up with grandkids, 14 min paced mile    Intervention Will continue to monitor pt and progress workloads as tolerated without sign or symptom    Expected Outcomes Pt will achive her goals and gain strength             Core Components/Risk Factors/Patient Goals Review:   Goals and Risk Factor Review     Row Name 04/10/23 0759 04/22/23 1432 05/20/23 1425 06/16/23 1724       Core Components/Risk Factors/Patient Goals Review   Personal Goals Review Weight Management/Obesity;Hypertension;Lipids;Stress Weight Management/Obesity;Hypertension;Lipids;Stress Weight Management/Obesity;Hypertension;Lipids;Stress Weight Management/Obesity;Hypertension;Lipids;Stress    Review Shelby Vazquez started intensive cardiac rehab on 04/09/23 and did well with exercise. Spot checked CBG as Shelby Vazquez is a diet controlled prediabetic. Vital signs and CBG's were stable Shelby Vazquez is doing  well with exercise at cardiac rehab.. Vital signs have been stable. Shelby Vazquez is enjoying participating in cardiac rehab. Shelby Vazquez says coming to cardiac rehab his her "happy place" Shelby Vazquez continues to do  well with exercise at cardiac rehab.. Vital signs remain stable. Shelby Vazquez continues to enjoy participating in cardiac rehab. Shelby Vazquez continues to do  well with exercise at cardiac rehab.. Vital signs remain stable. Shelby Vazquez continues to enjoy participating in cardiac rehab. Shelby Vazquez will complete cardiac rehab  on 06/25/23. Shelby Vazquez is happy with her progree in the program.    Expected Outcomes Shelby Vazquez will continue to participate in intensive cardiac rehab for exercise, nutrition and lifestyle modifications Shelby Vazquez will continue to participate in intensive cardiac rehab for exercise, nutrition and lifestyle modifications Shelby Vazquez will continue to participate in intensive cardiac rehab for exercise, nutrition and lifestyle modifications Shelby Vazquez will continue to participate in intensive cardiac rehab for exercise, nutrition and lifestyle modifications             Core Components/Risk Factors/Patient Goals at Discharge (Final Review):   Goals and Risk Factor Review - 06/16/23 1724       Core Components/Risk Factors/Patient Goals Review   Personal Goals Review Weight Management/Obesity;Hypertension;Lipids;Stress    Review Shelby Vazquez continues to do  well with exercise at cardiac rehab.. Vital signs remain stable. Shelby Vazquez continues to enjoy participating in cardiac rehab. Shelby Vazquez will complete cardiac rehab on 06/25/23. Shelby Vazquez is happy with her progree in the program.    Expected Outcomes Shelby Vazquez will continue to participate in intensive cardiac rehab for exercise, nutrition and lifestyle modifications             ITP Comments:  ITP Comments     Row Name 04/03/23 0916 04/10/23 0749 04/22/23 1421 05/20/23 1417 06/16/23 1720   ITP Comments Armanda Magic, MD: Medical Director.  Introduction to the [facility] program / Intensive Cardiac Rehab.  Initial orientation packet reviewed with the patient. 30 Day ITP Review. Shelby Vazquez started intensive cardiac rehab on 04/09/23 and did well with exercise 30 Day ITP Review. Shelby Vazquez has good attendance and participation in intensive cardiac rehab exercise and is off to a good start to exercoise 30 Day ITP Review. Shelby Vazquez has good attendance and participation in intensive cardiac rehab exercise 30 Day ITP Review. Shelby Vazquez has good attendance and participation in intensive cardiac rehab exercise. Shelby Vazquez will complete  cardiac rehab on 06/25/23            Comments: See ITP Comments

## 2023-06-17 ENCOUNTER — Other Ambulatory Visit: Payer: Self-pay

## 2023-06-17 DIAGNOSIS — N958 Other specified menopausal and perimenopausal disorders: Secondary | ICD-10-CM | POA: Diagnosis not present

## 2023-06-17 DIAGNOSIS — M8588 Other specified disorders of bone density and structure, other site: Secondary | ICD-10-CM | POA: Diagnosis not present

## 2023-06-17 LAB — HM DEXA SCAN

## 2023-06-18 ENCOUNTER — Encounter (HOSPITAL_COMMUNITY)
Admission: RE | Admit: 2023-06-18 | Discharge: 2023-06-18 | Disposition: A | Discharge status: Home / Self Care | Payer: Medicare PPO | Source: Ambulatory Visit | Attending: Cardiology | Admitting: Cardiology

## 2023-06-18 DIAGNOSIS — I5181 Takotsubo syndrome: Secondary | ICD-10-CM

## 2023-06-18 DIAGNOSIS — I214 Non-ST elevation (NSTEMI) myocardial infarction: Secondary | ICD-10-CM

## 2023-06-20 ENCOUNTER — Encounter (HOSPITAL_COMMUNITY)
Admission: RE | Admit: 2023-06-20 | Discharge: 2023-06-20 | Disposition: A | Discharge status: Home / Self Care | Payer: Medicare PPO | Source: Ambulatory Visit | Attending: Cardiology | Admitting: Cardiology

## 2023-06-20 ENCOUNTER — Ambulatory Visit (HOSPITAL_BASED_OUTPATIENT_CLINIC_OR_DEPARTMENT_OTHER): Payer: Medicare PPO

## 2023-06-20 DIAGNOSIS — I5181 Takotsubo syndrome: Secondary | ICD-10-CM

## 2023-06-20 DIAGNOSIS — I214 Non-ST elevation (NSTEMI) myocardial infarction: Secondary | ICD-10-CM

## 2023-06-22 ENCOUNTER — Encounter (HOSPITAL_BASED_OUTPATIENT_CLINIC_OR_DEPARTMENT_OTHER): Payer: Self-pay

## 2023-06-22 LAB — ECHOCARDIOGRAM COMPLETE
AR max vel: 1.9 cm2
AV Area VTI: 1.91 cm2
AV Area mean vel: 1.79 cm2
AV Mean grad: 3 mmHg
AV Peak grad: 5.8 mmHg
AV Vena cont: 0.18 cm
Ao pk vel: 1.2 m/s
Area-P 1/2: 3.89 cm2
P 1/2 time: 518 ms
S' Lateral: 2.98 cm

## 2023-06-24 ENCOUNTER — Other Ambulatory Visit (HOSPITAL_BASED_OUTPATIENT_CLINIC_OR_DEPARTMENT_OTHER): Payer: Self-pay

## 2023-06-24 ENCOUNTER — Other Ambulatory Visit: Payer: Self-pay

## 2023-06-25 ENCOUNTER — Encounter (HOSPITAL_COMMUNITY)
Admission: RE | Admit: 2023-06-25 | Discharge: 2023-06-25 | Disposition: A | Discharge status: Home / Self Care | Payer: Medicare PPO | Source: Ambulatory Visit | Attending: Cardiology | Admitting: Cardiology

## 2023-06-25 VITALS — BP 120/56 | HR 72 | Ht 64.0 in | Wt 147.7 lb

## 2023-06-25 DIAGNOSIS — Z5189 Encounter for other specified aftercare: Secondary | ICD-10-CM | POA: Diagnosis not present

## 2023-06-25 DIAGNOSIS — I252 Old myocardial infarction: Secondary | ICD-10-CM | POA: Diagnosis not present

## 2023-06-25 DIAGNOSIS — I214 Non-ST elevation (NSTEMI) myocardial infarction: Secondary | ICD-10-CM

## 2023-06-25 DIAGNOSIS — I5181 Takotsubo syndrome: Secondary | ICD-10-CM | POA: Diagnosis not present

## 2023-06-25 NOTE — Progress Notes (Signed)
Discharge Progress Report  Patient Details  Name: Shelby Vazquez MRN: [id number] Date of Birth: 1951-02-25 Referring Provider:   Flowsheet Row INTENSIVE CARDIAC REHAB ORIENT from 04/03/2023 in [facility]  Referring Provider Dr. Bryan Lemma MD        Number of Visits: 77  Reason for Discharge:  Patient reached a stable level of exercise. Patient independent in their exercise. Patient has met program and personal goals.  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 Bryan Lemma MD    Expected Discharge Date 06/13/23      NuStep   Level 2    SPM 80    Minutes 15    METs 2.2      Track   Laps 22    Minutes 15    METs 2.9      Prescription Details   Frequency (times per week) 3    Duration Progress to 30 minutes of continuous aerobic without signs/symptoms of physical distress      Intensity   THRR 40-80% of Max Heartrate 59-118    Ratings of Perceived Exertion 11-13    Perceived Dyspnea 0-4      Progression   Progression Continue progressive overload as per policy without signs/symptoms or physical distress.      Resistance Training   Training Prescription Yes    Weight 2    Reps 10-15             Discharge Exercise Prescription (Final Exercise Prescription Changes):  Exercise Prescription Changes - 06/25/23 1036       Response to Exercise   Blood Pressure (Admit) 120/56    Blood Pressure (Exercise) 138/76    Blood Pressure (Exit) 148/80    Heart Rate (Admit) 72 bpm    Heart Rate (Exercise) 121 bpm    Heart Rate (Exit) 82 bpm    Rating of Perceived Exertion (Exercise) 11     Symptoms None    Comments Pam completed the cardiac rehab program today.    Duration Continue with 30 min of aerobic exercise without signs/symptoms of physical distress.    Intensity THRR unchanged      Progression   Progression Continue to progress workloads to maintain intensity without signs/symptoms of physical distress.    Average METs 3.4      Resistance Training   Training Prescription No   Relaxation day, no weights.     Interval Training   Interval Training No      NuStep   Level 3    SPM 112    Minutes 15    METs 2.9      Track   Laps 23    Minutes 15    METs 3.93      Home Exercise Plan   Plans to continue exercise at Home (comment)   Walking   Frequency Add 4 additional days to program exercise sessions.    Initial Home Exercises Provided 04/23/23             Functional Capacity:  6 Minute Walk     Row Name 04/03/23 1146 06/18/23  1040       6 Minute Walk   Phase Initial Discharge    Distance 1731 feet 2114 feet    Distance % Change -- 22.13 %    Distance Feet Change -- 383 ft    Walk Time 6 minutes 6 minutes    # of Rest Breaks 0 0    MPH 3.28 4    METS 3.69 4.71    RPE 11 11    Perceived Dyspnea  1 1    VO2 Peak 12.91 16.49    Symptoms Yes (comment) Yes (comment)    Comments Pre walk test patient SOBx1 SaO2 98% possible anxiety. Nurse aware, lung sounds clear. Resolved with rest. Patient felt good on the walk test, SOB x1 post, resolved with rest Mild shortness of breath    Resting HR 64 bpm 82 bpm    Resting BP 112/70 120/72    Resting Oxygen Saturation  98 % --    Exercise Oxygen Saturation  during 6 min walk 98 % --    Max Ex. HR 97 bpm 129 bpm    Max Ex. BP 156/62 148/72    2 Minute Post BP 134/68 110/68             Psychological, QOL, Others - Outcomes: PHQ 2/9:    06/25/2023   11:22 AM 06/03/2023    2:36 PM 04/03/2023   11:08 AM 11/08/2022    9:12 AM 04/17/2022    3:16 PM  Depression screen PHQ 2/9  Decreased Interest 0  0 0 0 1  Down, Depressed, Hopeless 0 0 3 0 1  PHQ - 2 Score 0 0 3 0 2  Altered sleeping 0 0 1 0 3  Tired, decreased energy 0 0 3 3 2   Change in appetite 0 0 1 0 0  Feeling bad or failure about yourself  0 0 3 0 1  Trouble concentrating 0 1 3 1 1   Moving slowly or fidgety/restless 0 0 1 0 0  Suicidal thoughts 0 0 0 0 0  PHQ-9 Score 0 1 15 4 9   Difficult doing work/chores Not difficult at all Somewhat difficult Somewhat difficult Somewhat difficult Somewhat difficult    Quality of Life:  Quality of Life - 06/25/23 1617       Quality of Life   Select Quality of Life      Quality of Life Scores   Health/Function Pre 11.2 %    Health/Function Post 29.6 %    Health/Function % Change 164.29 %    Socioeconomic Pre 27.86 %    Socioeconomic Post 28.21 %    Socioeconomic % Change  1.26 %    Psych/Spiritual Pre 6.86 %    Psych/Spiritual Post 30 %    Psych/Spiritual % Change 337.32 %    Family Pre 21.6 %    Family Post 27.6 %    Family % Change 27.78 %    GLOBAL Pre 15.26 %    GLOBAL Post 29.1 %    GLOBAL % Change 90.69 %             Personal Goals: Goals established at orientation with interventions provided to work toward goal.  Personal Goals and Risk Factors at Admission - 04/03/23 1216       Core Components/Risk Factors/Patient Goals on Admission    Weight Management Yes;Weight Loss    Intervention Weight Management: Develop a combined nutrition and exercise program designed to reach desired caloric intake, while maintaining  appropriate intake of nutrient and fiber, sodium and fats, and appropriate energy expenditure required for the weight goal.;Weight Management: Provide education and appropriate resources to help participant work on and attain dietary goals.;Weight Management/Obesity: Establish reasonable short term and long term weight goals.;Obesity: Provide education and appropriate resources to help participant work on and attain dietary goals.    Admit Weight 154  lb 8.7 oz (70.1 kg)    Hypertension Yes    Intervention Provide education on lifestyle modifcations including regular physical activity/exercise, weight management, moderate sodium restriction and increased consumption of fresh fruit, vegetables, and low fat dairy, alcohol moderation, and smoking cessation.;Monitor prescription use compliance.    Expected Outcomes Short Term: Continued assessment and intervention until BP is < 140/34mm HG in hypertensive participants. < 130/59mm HG in hypertensive participants with diabetes, heart failure or chronic kidney disease.;Long Term: Maintenance of blood pressure at goal levels.    Lipids Yes    Intervention Provide education and support for participant on nutrition & aerobic/resistive exercise along with prescribed medications to achieve LDL 70mg , HDL >40mg .    Expected Outcomes Short Term: Participant states understanding of desired cholesterol values and is compliant with medications prescribed. Participant is following exercise prescription and nutrition guidelines.;Long Term: Cholesterol controlled with medications as prescribed, with individualized exercise RX and with personalized nutrition plan. Value goals: LDL < 70mg , HDL > 40 mg.    Stress Yes    Intervention Offer individual and/or small group education and counseling on adjustment to heart disease, stress management and health-related lifestyle change. Teach and support self-help strategies.;Refer participants experiencing significant psychosocial distress to appropriate mental health specialists for further evaluation and treatment. When possible, include family members and significant others in education/counseling sessions.    Expected Outcomes Short Term: Participant demonstrates changes in health-related behavior, relaxation and other stress management skills, ability to obtain effective social support, and compliance with psychotropic medications if prescribed.;Long Term: Emotional wellbeing is  indicated by absence of clinically significant psychosocial distress or social isolation.    Personal Goal Other Yes    Personal Goal short term: balance training, know limits to exercise Long term: tuned up to keep up with grandkids, 14 min paced mile    Intervention Will continue to monitor pt and progress workloads as tolerated without sign or symptom    Expected Outcomes Pt will achive her goals and gain strength              Personal Goals Discharge:  Goals and Risk Factor Review     Row Name 04/10/23 0759 04/22/23 1432 05/20/23 1425 06/16/23 1724       Core Components/Risk Factors/Patient Goals Review   Personal Goals Review Weight Management/Obesity;Hypertension;Lipids;Stress Weight Management/Obesity;Hypertension;Lipids;Stress Weight Management/Obesity;Hypertension;Lipids;Stress Weight Management/Obesity;Hypertension;Lipids;Stress    Review Pam started intensive cardiac rehab on 04/09/23 and did well with exercise. Spot checked CBG as Pam is a diet controlled prediabetic. Vital signs and CBG's were stable Pam is doing  well with exercise at cardiac rehab.. Vital signs have been stable. Pam is enjoying participating in cardiac rehab. Pam says coming to cardiac rehab his her "happy place" Pam continues to do  well with exercise at cardiac rehab.. Vital signs remain stable. Pam continues to enjoy participating in cardiac rehab. Pam continues to do  well with exercise at cardiac rehab.. Vital signs remain stable. Pam continues to enjoy participating in cardiac rehab. Pam will complete cardiac rehab on 06/25/23. Pam is happy with her progree in the program.    Expected Outcomes Pam will  continue to participate in intensive cardiac rehab for exercise, nutrition and lifestyle modifications Pam will continue to participate in intensive cardiac rehab for exercise, nutrition and lifestyle modifications Pam will continue to participate in intensive cardiac rehab for exercise, nutrition and  lifestyle modifications Pam will continue to participate in intensive cardiac rehab for exercise, nutrition and lifestyle modifications             Exercise Goals and Review:  Exercise Goals     Row Name 04/03/23 1208             Exercise Goals   Increase Physical Activity Yes       Intervention Provide advice, education, support and counseling about physical activity/exercise needs.;Develop an individualized exercise prescription for aerobic and resistive training based on initial evaluation findings, risk stratification, comorbidities and participant's personal goals.       Expected Outcomes Short Term: Attend rehab on a regular basis to increase amount of physical activity.;Long Term: Exercising regularly at least 3-5 days a week.;Long Term: Add in home exercise to make exercise part of routine and to increase amount of physical activity.       Increase Strength and Stamina Yes       Intervention Provide advice, education, support and counseling about physical activity/exercise needs.;Develop an individualized exercise prescription for aerobic and resistive training based on initial evaluation findings, risk stratification, comorbidities and participant's personal goals.       Expected Outcomes Short Term: Increase workloads from initial exercise prescription for resistance, speed, and METs.;Short Term: Perform resistance training exercises routinely during rehab and add in resistance training at home;Long Term: Improve cardiorespiratory fitness, muscular endurance and strength as measured by increased METs and functional capacity ( )       Able to understand and use rate of perceived exertion (RPE) scale Yes       Intervention Provide education and explanation on how to use RPE scale       Expected Outcomes Short Term: Able to use RPE daily in rehab to express subjective intensity level;Long Term:  Able to use RPE to guide intensity level when exercising independently        Knowledge and understanding of Target Heart Rate Range (THRR) Yes       Intervention Provide education and explanation of THRR including how the numbers were predicted and where they are located for reference       Expected Outcomes Short Term: Able to state/look up THRR;Long Term: Able to use THRR to govern intensity when exercising independently;Short Term: Able to use daily as guideline for intensity in rehab       Understanding of Exercise Prescription Yes       Intervention Provide education, explanation, and written materials on patient's individual exercise prescription       Expected Outcomes Short Term: Able to explain program exercise prescription;Long Term: Able to explain home exercise prescription to exercise independently                Exercise Goals Re-Evaluation:  Exercise Goals Re-Evaluation     Row Name 04/09/23 1129 04/23/23 1059 05/07/23 1100 06/04/23 1048 06/16/23 1113     Exercise Goal Re-Evaluation   Exercise Goals Review Increase Physical Activity;Increase Strength and Stamina;Able to understand and use rate of perceived exertion (RPE) scale Increase Physical Activity;Increase Strength and Stamina;Able to understand and use rate of perceived exertion (RPE) scale;Able to check pulse independently;Knowledge and understanding of Target Heart Rate Range (THRR);Understanding of Exercise Prescription Increase Physical Activity;Increase  Strength and Stamina;Able to understand and use rate of perceived exertion (RPE) scale;Able to check pulse independently;Knowledge and understanding of Target Heart Rate Range (THRR);Understanding of Exercise Prescription Increase Physical Activity;Increase Strength and Stamina;Able to understand and use rate of perceived exertion (RPE) scale;Able to check pulse independently;Knowledge and understanding of Target Heart Rate Range (THRR);Understanding of Exercise Prescription Increase Physical Activity;Increase Strength and Stamina;Able to  understand and use rate of perceived exertion (RPE) scale;Able to check pulse independently;Knowledge and understanding of Target Heart Rate Range (THRR);Understanding of Exercise Prescription   Comments Pam was able to understand and use RPE scale appropriately. Reviewed exercise prescription with Pam. She is walking and occassionally experiences chest discomfort afterwards. Discussed decreasing duration from 18 minutes BID to 15 minutes once or twice/day depending on how she's feeling. She has a smart watch that may be able to check her pulse. Pam states she's stretching, walking outdoors, and light laps/walking in the pool after walk. Given stretching hand out with the warm-up and cool-down stretches from cardiac rehab. Pam feels she doign well with her home exercise routine and is even pushing it a little. She has increased her walking duration to 20 minutes daily. She will be on vacation next week and plans to stay active. Pam is doing well with her home exercise routine. She has a variety of exercise activity that she participates in daily. She walks 15-20 minutes daily at 19-20 minute mile pace, she stretches pre and post exercise 15-20 minutes, she exercises in her pool with a noodle 15-20 minutes, [facility]. [facility] of the program she will continue this routine possibly adding an aerobics class 2 days/week in anticipation of closing her pool, and [facility]. Her walking trail has some incline, which is still tough for her.   Expected Outcomes Progress workloads as tolerated to help achieve personal health and fitness goals. Pam will walk 15 minutes 1-2 x's/day 2-4 days/week as tolerated in addition to exercise at cardiac rehab to achieve 150 minutes aerobic exercise/week. Continue home exercise routine in addition to exercise at cardiac rehab. Continue walking at home in addition to exercise at cardiac rehab. Pam will continue her  home exercise routine in addition to exercise at cardiac rehab.    Row Name 06/25/23 1134             Exercise Goal Re-Evaluation   Exercise Goals Review Increase Physical Activity;Increase Strength and Stamina;Able to understand and use rate of perceived exertion (RPE) scale;Able to check pulse independently;Knowledge and understanding of Target Heart Rate Range (THRR);Understanding of Exercise Prescription       Comments Pam completed the cardiac rehab program today and progessed very well achieving 3.4 METs with exercise. She is extremely pleased with her progress and especially with her ECHO results, which showed her heart has returned to normal function. Her EF improved from 35-40% to 60-65%. She will continue exercise at home and at fitness center.       Expected Outcomes Pam will continue exercise to maintain health and fitness gains.                Nutrition & Weight - Outcomes:  Pre Biometrics - 04/03/23 1144       Pre Biometrics   Waist Circumference 37 inches    Hip Circumference 40.5 inches    Waist to Hip Ratio 0.91 %    Triceps Skinfold 19 mm    %  Body Fat 37.3 %    Grip Strength 24 kg    Flexibility 12.25 in    Single Leg Stand 4.5 seconds             Post Biometrics - 06/25/23 1033        Post  Biometrics   Height 5\' 4"  (1.626 m)    Waist Circumference 35 inches    Hip Circumference 39.5 inches    Waist to Hip Ratio 0.89 %    BMI (Calculated) 25.34    Triceps Skinfold 11 mm    % Body Fat 33.2 %    Grip Strength 30 kg    Flexibility 14.5 in    Single Leg Stand 12.62 seconds             Nutrition:  Nutrition Therapy & Goals - 06/09/23 1135       Nutrition Therapy   Diet Heart Healthy Diet    Drug/Food Interactions Statins/Certain Fruits      Personal Nutrition Goals   Nutrition Goal Patient to improve diet quality by using the plate method as a guide for meal planning to include lean protein/plant protein, fruits, vegetables, whole  grains, nonfat dairy as part of a well-balanced diet.   goal in action.   Personal Goal #2 Patient to identify strategies for reducing cardiovascular risk by attending the [employer] education and nutrition series weekly.   goal in action.   Personal Goal #3 Patient to reduce sodium to 1500mg  per day   goal in action.   Comments Goals in action. Pam continues to attend the [employer] education and nutrition series. Pam reports making many changes including reduced sodium intake and decreased saturated fat/meat intake. She continues to prioritize fiber intake. Pam is down 6.8# since starting with our program. Patient will benefit from participation in intensive cardiac rehab for nutrition, exercise, and lifestyle modification.      Intervention Plan   Intervention Prescribe, educate and counsel regarding individualized specific dietary modifications aiming towards targeted core components such as weight, hypertension, lipid management, diabetes, heart failure and other comorbidities.;Nutrition handout(s) given to patient.    Expected Outcomes Short Term Goal: Understand basic principles of dietary content, such as calories, fat, sodium, cholesterol and nutrients.;Long Term Goal: Adherence to prescribed nutrition plan.             Nutrition Discharge:  Nutrition Assessments - 06/25/23 1107       Rate Your Plate Scores   Pre Score 58    Post Score 84             Education Questionnaire Score:  Knowledge Questionnaire Score - 06/25/23 1134       Knowledge Questionnaire Score   Pre Score 21/24    Post Score 22/24             Goals reviewed with patient; copy given to patient.Pt graduated from  Intensive/Traditional cardiac rehab program on 06/25/23  with completion of  30 exercise and  30 education sessions. Pt maintained good attendance and progressed nicely during their participation in rehab as evidenced by increased MET level. Pam increased her distance by 383 feet.    Medication list reconciled. Repeat  PHQ score- 0 .  Pt has made significant lifestyle changes and should be commended for her success. Pam  achieved their goals during cardiac rehab. Pam plans to continue exercise at home walking 20 minutes a day, stretching and water walking in the pool. We are proud of Pam's progress. Pam  says cardiac rehab helped her endurance. Pam feels a lot better since she completed the program. We are proud of Pam's progress! Thayer Headings RN BSN

## 2023-06-30 ENCOUNTER — Other Ambulatory Visit (HOSPITAL_BASED_OUTPATIENT_CLINIC_OR_DEPARTMENT_OTHER): Payer: Self-pay

## 2023-06-30 ENCOUNTER — Other Ambulatory Visit: Payer: Self-pay | Admitting: Family Medicine

## 2023-06-30 DIAGNOSIS — E611 Iron deficiency: Secondary | ICD-10-CM

## 2023-06-30 DIAGNOSIS — E119 Type 2 diabetes mellitus without complications: Secondary | ICD-10-CM

## 2023-06-30 DIAGNOSIS — M85851 Other specified disorders of bone density and structure, right thigh: Secondary | ICD-10-CM

## 2023-06-30 DIAGNOSIS — E538 Deficiency of other specified B group vitamins: Secondary | ICD-10-CM

## 2023-06-30 DIAGNOSIS — R7989 Other specified abnormal findings of blood chemistry: Secondary | ICD-10-CM

## 2023-06-30 DIAGNOSIS — E782 Mixed hyperlipidemia: Secondary | ICD-10-CM

## 2023-07-04 ENCOUNTER — Other Ambulatory Visit (INDEPENDENT_AMBULATORY_CARE_PROVIDER_SITE_OTHER): Payer: Medicare PPO

## 2023-07-04 DIAGNOSIS — E611 Iron deficiency: Secondary | ICD-10-CM

## 2023-07-04 DIAGNOSIS — E782 Mixed hyperlipidemia: Secondary | ICD-10-CM | POA: Diagnosis not present

## 2023-07-04 DIAGNOSIS — R7989 Other specified abnormal findings of blood chemistry: Secondary | ICD-10-CM | POA: Diagnosis not present

## 2023-07-04 DIAGNOSIS — M85851 Other specified disorders of bone density and structure, right thigh: Secondary | ICD-10-CM

## 2023-07-04 DIAGNOSIS — E538 Deficiency of other specified B group vitamins: Secondary | ICD-10-CM | POA: Diagnosis not present

## 2023-07-04 DIAGNOSIS — E119 Type 2 diabetes mellitus without complications: Secondary | ICD-10-CM | POA: Diagnosis not present

## 2023-07-04 LAB — CBC WITH DIFFERENTIAL/PLATELET
Basophils Absolute: 0 10*3/uL (ref 0.0–0.1)
Basophils Relative: 0.5 % (ref 0.0–3.0)
Eosinophils Absolute: 0.1 10*3/uL (ref 0.0–0.7)
Eosinophils Relative: 1.9 % (ref 0.0–5.0)
HCT: 37.8 % (ref 36.0–46.0)
Hemoglobin: 12.7 g/dL (ref 12.0–15.0)
Lymphocytes Relative: 49.2 % — ABNORMAL HIGH (ref 12.0–46.0)
Lymphs Abs: 2.4 10*3/uL (ref 0.7–4.0)
MCHC: 33.7 g/dL (ref 30.0–36.0)
MCV: 93.7 fl (ref 78.0–100.0)
Monocytes Absolute: 0.3 10*3/uL (ref 0.1–1.0)
Monocytes Relative: 6.9 % (ref 3.0–12.0)
Neutro Abs: 2 10*3/uL (ref 1.4–7.7)
Neutrophils Relative %: 41.5 % — ABNORMAL LOW (ref 43.0–77.0)
Platelets: 296 10*3/uL (ref 150.0–400.0)
RBC: 4.03 Mil/uL (ref 3.87–5.11)
RDW: 13.8 % (ref 11.5–15.5)
WBC: 4.8 10*3/uL (ref 4.0–10.5)

## 2023-07-04 LAB — COMPREHENSIVE METABOLIC PANEL
ALT: 19 U/L (ref 0–35)
AST: 26 U/L (ref 0–37)
Albumin: 4.2 g/dL (ref 3.5–5.2)
Alkaline Phosphatase: 31 U/L — ABNORMAL LOW (ref 39–117)
BUN: 10 mg/dL (ref 6–23)
CO2: 28 meq/L (ref 19–32)
Calcium: 9.7 mg/dL (ref 8.4–10.5)
Chloride: 103 meq/L (ref 96–112)
Creatinine, Ser: 0.9 mg/dL (ref 0.40–1.20)
GFR: 63.88 mL/min (ref >60.00)
Glucose, Bld: 104 mg/dL — ABNORMAL HIGH (ref 70–99)
Potassium: 4.3 meq/L (ref 3.5–5.1)
Sodium: 137 meq/L (ref 135–145)
Total Bilirubin: 0.5 mg/dL (ref 0.2–1.2)
Total Protein: 6.2 g/dL (ref 6.0–8.3)

## 2023-07-04 LAB — LIPID PANEL
Cholesterol: 102 mg/dL (ref 0–200)
HDL: 41 mg/dL (ref >39.00)
LDL Cholesterol: 38 mg/dL (ref 0–99)
NonHDL: 60.54
Total CHOL/HDL Ratio: 2
Triglycerides: 112 mg/dL (ref 0.0–149.0)
VLDL: 22.4 mg/dL (ref 0.0–40.0)

## 2023-07-04 LAB — IBC PANEL
Iron: 114 ug/dL (ref 42–145)
Saturation Ratios: 32.7 % (ref 20.0–50.0)
TIBC: 348.6 ug/dL (ref 250.0–450.0)
Transferrin: 249 mg/dL (ref 212.0–360.0)

## 2023-07-04 LAB — TSH: TSH: 2.39 u[IU]/mL (ref 0.35–5.50)

## 2023-07-04 LAB — FERRITIN: Ferritin: 110.7 ng/mL (ref 10.0–291.0)

## 2023-07-04 LAB — VITAMIN D 25 HYDROXY (VIT D DEFICIENCY, FRACTURES): VITD: 69.85 ng/mL (ref 30.00–100.00)

## 2023-07-04 LAB — HEMOGLOBIN A1C: Hgb A1c MFr Bld: 6.6 % — ABNORMAL HIGH (ref 4.6–6.5)

## 2023-07-04 LAB — T4, FREE: Free T4: 0.93 ng/dL (ref 0.60–1.60)

## 2023-07-04 LAB — MICROALBUMIN / CREATININE URINE RATIO
Creatinine,U: 64.8 mg/dL
Microalb Creat Ratio: 1.4 mg/g (ref 0.0–30.0)
Microalb, Ur: 0.9 mg/dL (ref 0.0–1.9)

## 2023-07-04 LAB — VITAMIN B12: Vitamin B-12: 370 pg/mL (ref 211–911)

## 2023-07-11 ENCOUNTER — Encounter: Payer: Self-pay | Admitting: Family Medicine

## 2023-07-11 ENCOUNTER — Ambulatory Visit (INDEPENDENT_AMBULATORY_CARE_PROVIDER_SITE_OTHER): Payer: Medicare PPO | Admitting: Family Medicine

## 2023-07-11 VITALS — BP 128/82 | HR 87 | Temp 97.6°F | Ht 64.0 in | Wt 141.6 lb

## 2023-07-11 DIAGNOSIS — I2510 Atherosclerotic heart disease of native coronary artery without angina pectoris: Secondary | ICD-10-CM

## 2023-07-11 DIAGNOSIS — I700 Atherosclerosis of aorta: Secondary | ICD-10-CM

## 2023-07-11 DIAGNOSIS — Z0000 Encounter for general adult medical examination without abnormal findings: Secondary | ICD-10-CM

## 2023-07-11 DIAGNOSIS — Z7189 Other specified counseling: Secondary | ICD-10-CM | POA: Insufficient documentation

## 2023-07-11 DIAGNOSIS — I77810 Thoracic aortic ectasia: Secondary | ICD-10-CM

## 2023-07-11 DIAGNOSIS — F411 Generalized anxiety disorder: Secondary | ICD-10-CM | POA: Diagnosis not present

## 2023-07-11 DIAGNOSIS — H9193 Unspecified hearing loss, bilateral: Secondary | ICD-10-CM

## 2023-07-11 DIAGNOSIS — M85851 Other specified disorders of bone density and structure, right thigh: Secondary | ICD-10-CM

## 2023-07-11 DIAGNOSIS — I10 Essential (primary) hypertension: Secondary | ICD-10-CM | POA: Diagnosis not present

## 2023-07-11 DIAGNOSIS — K582 Mixed irritable bowel syndrome: Secondary | ICD-10-CM

## 2023-07-11 DIAGNOSIS — F331 Major depressive disorder, recurrent, moderate: Secondary | ICD-10-CM

## 2023-07-11 DIAGNOSIS — K219 Gastro-esophageal reflux disease without esophagitis: Secondary | ICD-10-CM | POA: Diagnosis not present

## 2023-07-11 DIAGNOSIS — E1169 Type 2 diabetes mellitus with other specified complication: Secondary | ICD-10-CM

## 2023-07-11 DIAGNOSIS — E785 Hyperlipidemia, unspecified: Secondary | ICD-10-CM

## 2023-07-11 DIAGNOSIS — E538 Deficiency of other specified B group vitamins: Secondary | ICD-10-CM | POA: Diagnosis not present

## 2023-07-11 DIAGNOSIS — E119 Type 2 diabetes mellitus without complications: Secondary | ICD-10-CM

## 2023-07-11 DIAGNOSIS — N390 Urinary tract infection, site not specified: Secondary | ICD-10-CM

## 2023-07-11 MED ORDER — LOPERAMIDE HCL 2 MG PO TABS: 2.0000 mg | ORAL_TABLET | Freq: Two times a day (BID) | ORAL | Status: AC | PRN

## 2023-07-11 NOTE — Assessment & Plan Note (Signed)
Followed by cardiology

## 2023-07-11 NOTE — Assessment & Plan Note (Signed)
Chronic, stable on crestor 20mg  daily - continue. The ASCVD Risk score (Arnett DK, et al., 2019) failed to calculate for the following reasons:   The patient has a prior MI or stroke diagnosis

## 2023-07-11 NOTE — Assessment & Plan Note (Signed)
Regularly wears hearing aides.

## 2023-07-11 NOTE — Assessment & Plan Note (Signed)
Chronic, stable period on daily B complex vitamin

## 2023-07-11 NOTE — Assessment & Plan Note (Addendum)
Advanced directives: does not have this set up. Packet provided today- encouraged to continue working on this. HCPOA would be daughter Shelby Vazquez.

## 2023-07-11 NOTE — Assessment & Plan Note (Signed)
Sees urology on Hiprex daily

## 2023-07-11 NOTE — Assessment & Plan Note (Signed)
Chronic, stable on current regimen - continue.

## 2023-07-11 NOTE — Assessment & Plan Note (Signed)
Discussed limiting added sugar/ carbs in diet.  Reassess at 6 mo f/u visit

## 2023-07-11 NOTE — Assessment & Plan Note (Signed)
Chronic, severe, stable period on current GI regimen.

## 2023-07-11 NOTE — Assessment & Plan Note (Signed)
Appreciate cardiology care.

## 2023-07-11 NOTE — Assessment & Plan Note (Signed)
Stable on aciphex + pepcid

## 2023-07-11 NOTE — Assessment & Plan Note (Signed)
Chronic, stable period on calcium/vit D supplementation.

## 2023-07-11 NOTE — Patient Instructions (Addendum)
Advanced directive packet provided today  Good to see you today  I'm glad you're doing well  Return in 6 months for diabetes check

## 2023-07-11 NOTE — Assessment & Plan Note (Signed)
Continue aspirin, statin.

## 2023-07-11 NOTE — Assessment & Plan Note (Signed)
Preventative protocols reviewed and updated unless pt declined. Discussed healthy diet and lifestyle.  

## 2023-07-11 NOTE — Assessment & Plan Note (Signed)
Chronic, stable period off medication.  ?

## 2023-07-11 NOTE — Assessment & Plan Note (Signed)
Stable period off medication.

## 2023-07-11 NOTE — Progress Notes (Signed)
Ph: [phone] Fax: [phone]   Patient ID: Shelby Vazquez, female    DOB: Jun 11, 1951, 72 y.o.   MRN: [id number]  This visit was conducted in person.  BP 128/82   Pulse 87   Temp 97.6 F (36.4 C) (Temporal)   Ht 5\' 4"  (1.626 m)   Wt 141 lb 9.6 oz (64.2 kg)   SpO2 98%   BMI 24.31 kg/m    CC: CPE  Subjective:   HPI: Shelby Vazquez is a 72 y.o. female presenting on 07/11/2023 for Annual Exam (Pt states of working out everyday. Pt states she is consistent with cardiac work-outs. (7 days a week) . Yes to flu shot. )   Saw health advisor 05/2023 for medicare wellness visit. Note reviewed.   No results found.  Flowsheet Row Office Visit from 07/11/2023 in [facility]  PHQ-2 Total Score 0          07/11/2023    8:32 AM 06/25/2023   10:33 AM 06/20/2023   10:00 AM 06/18/2023   10:05 AM 06/16/2023   10:17 AM  Fall Risk   Falls in the past year? 0 0 0 0 0  Number falls in past yr: 0 0 0 0 0  Injury with Fall? 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 Runs 2 miles daily, working out 7d/wk. Latest echo 05/2023 - heart function had returned to nomral. Stable aortic dilation to 41mm.   IBS - doing well on Librax 1 tab BID, stopped dicyclomine 20mg  due to lack of effect. Continues benefiber PRN, IBGard PRN, miralax PRN. Lactose free diet. Predominantly managing with health diet. Ongoing stool urgency attributed to high fiber.    OAB, urinary retention, recurrent UTIs followed by urology Logan Bores). On Hiprex (methenamine) daily for UTI ppx. Also using InterStim.   GERD - on aciphex 20mg  daily and pepcid 40mg  qhs.   S/p Feraheme infusion x2 06/2020.   MDD - now off antidepressants - effexor, cymbalta worsened diarrhea. She has declined psychiatry, psychology eval.   Preventative: COLONOSCOPY WITH PROPOFOL Date: 03/31/2014 sessile serrated adenomas, sigmoid diverticulosis, rpt 3 yrs Florencia Reasons, MD) COLONOSCOPY 03/2018 - diverticulosis, rpt 5 yrs ([name])  Planned repeat colonoscopy 2025.  ESOPHAGOGASTRODUODENOSCOPY Date: 03/31/2014 mult gastric polyps, mild mucosal hemorrhages but no microscopic colitis ([name])  Well woman with Dr. 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 Packet provided today- encouraged to continue working on this. HCPOA would be daughter Lamar Sprinkles.  Seat belt use discussed  Sunscreen use discussed, no changing moles on skin  Non smoker Alcohol - none  Dentist - sees regularly Eye exam - due. Remotely saw Dr Dione Booze - has had difficulty  getting in with new provider Bowel - chronic IBS symptoms Bladder - occasional incontinence   Married 1973. 2 daughters '74, '80; 5 grandchildren.   No h/o physical or sexual abuse. 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   Edu: [employer]  Activity: enjoys hiking Diet: good water, fruits/vegetables daily     Relevant past medical, surgical, family and social history reviewed and updated as indicated. Interim medical history since our last visit reviewed. Allergies and medications reviewed and updated. Outpatient  Medications Prior to Visit  Medication Sig Dispense Refill   acetaminophen (TYLENOL) 500 MG tablet Take 500 mg by mouth every 6 (six) hours as needed.     aspirin 81 MG tablet Take 1 tablet (81 mg total) by mouth daily. 30 tablet    Calcium Carbonate-Vitamin D 600-200 MG-UNIT CAPS Take 1 capsule by mouth daily.     clidinium-chlordiazePOXIDE (LIBRAX) 5-2.5 MG capsule Take 1 capsule by mouth 2 (two) times daily at 8 am and 10 pm. 180 capsule 3   Estradiol-Estriol-Progesterone (BIEST/PROGESTERONE TD) Place 2 mLs onto the skin in the morning and at bedtime. Apply to inner thighs or arms     famotidine (PEPCID) 40 MG tablet Take 1 tablet (40 mg total) by mouth daily at bedtime. 90 tablet 3   loratadine (CLARITIN) 10 MG tablet Take 10 mg by mouth daily.     losartan (COZAAR) 50 MG tablet      methenamine (HIPREX) 1 g tablet Take 1 tablet (1 g total) by mouth 2 (two) times daily. 180 tablet 3   metoprolol succinate (TOPROL-XL) 100 MG 24 hr tablet Take 1 tablet (100 mg total) by mouth daily. Take with or immediately following a meal. 90 tablet 3   ondansetron (ZOFRAN-ODT) 4 MG disintegrating tablet DISSOLVE 1 TABLET(4 MG) ON THE TONGUE EVERY 8 HOURS AS NEEDED FOR NAUSEA OR VOMITING 30 tablet 00   Probiotic Product (PROBIOTIC PO) Take by mouth daily. 900 billion     RABEprazole (ACIPHEX) 20 MG tablet Take 1 tablet (20 mg total) by mouth daily. 90 tablet 3   rosuvastatin (CRESTOR) 20 MG tablet Take 1 tablet (20 mg total) by mouth every evening. 90 tablet 3   sacubitril-valsartan (ENTRESTO) 24-26 MG Take 1 tablet by mouth 2 (two) times daily. 180 tablet 3   spironolactone (ALDACTONE) 25 MG tablet Take 0.5 tablets (12.5 mg total) by mouth daily. 45 tablet 3   Vitamin B Complex-C CAPS Take 1 capsule by mouth daily.     Wheat Dextrin (BENEFIBER DRINK MIX PO) Take by mouth.     No facility-administered medications prior to visit.     Per HPI unless specifically indicated in ROS section below Review of  Systems  Constitutional:  Negative for activity change, appetite change, chills, fatigue, fever and unexpected weight change.  HENT:  Negative for hearing loss.   Eyes:  Negative for visual disturbance.  Respiratory:  Negative for cough, chest tightness, shortness of breath and wheezing.   Cardiovascular:  Negative for chest pain, palpitations and leg swelling.  Gastrointestinal:  Positive for constipation (IBS related). Negative for abdominal distention, abdominal pain, blood in stool, diarrhea, nausea and vomiting.  Genitourinary:  Negative for difficulty urinating and hematuria.  Musculoskeletal:  Negative for arthralgias, myalgias and neck pain.  Skin:  Negative for rash.  Neurological:  Negative for dizziness, seizures, syncope and headaches.  Hematological:  Negative for adenopathy. Does not bruise/bleed easily.  Psychiatric/Behavioral:  Negative for  dysphoric mood. The patient is not nervous/anxious.     Objective:  BP 128/82   Pulse 87   Temp 97.6 F (36.4 C) (Temporal)   Ht 5\' 4"  (1.626 m)   Wt 141 lb 9.6 oz (64.2 kg)   SpO2 98%   BMI 24.31 kg/m   Wt Readings from Last 3 Encounters:  07/11/23 141 lb 9.6 oz (64.2 kg)  06/25/23 147 lb 11.3 oz (67 kg)  06/03/23 145 lb 8 oz (66 kg)      Physical Exam Vitals and nursing note reviewed.  Constitutional:      Appearance: Normal appearance. She is not ill-appearing.  HENT:     Head: Normocephalic and atraumatic.     Right Ear: Tympanic membrane, ear canal and external ear normal. There is no impacted cerumen.     Left Ear: Tympanic membrane, ear canal and external ear normal. There is no impacted cerumen.     Nose: Nose normal.     Mouth/Throat:     Mouth: Mucous membranes are moist.     Pharynx: Oropharynx is clear. No oropharyngeal exudate or posterior oropharyngeal erythema.  Eyes:     General:        Right eye: No discharge.        Left eye: No discharge.     Extraocular Movements: Extraocular movements intact.      Conjunctiva/sclera: Conjunctivae normal.     Pupils: Pupils are equal, round, and reactive to light.  Neck:     Thyroid: No thyroid mass or thyromegaly.     Vascular: No carotid bruit.  Cardiovascular:     Rate and Rhythm: Normal rate and regular rhythm.     Pulses: Normal pulses.     Heart sounds: Normal heart sounds. No murmur heard. Pulmonary:     Effort: Pulmonary effort is normal. No respiratory distress.     Breath sounds: Normal breath sounds. No wheezing, rhonchi or rales.  Abdominal:     General: Bowel sounds are normal. There is no distension.     Palpations: Abdomen is soft. There is no mass.     Tenderness: There is no abdominal tenderness. There is no guarding or rebound.     Hernia: No hernia is present.  Musculoskeletal:     Cervical back: Normal range of motion and neck supple. No rigidity.     Right lower leg: No edema.     Left lower leg: No edema.  Lymphadenopathy:     Cervical: No cervical adenopathy.  Skin:    General: Skin is warm and dry.     Findings: No rash.  Neurological:     General: No focal deficit present.     Mental Status: She is alert. Mental status is at baseline.  Psychiatric:        Mood and Affect: Mood normal.        Behavior: Behavior normal.       Results for orders placed or performed in visit on 07/04/23  VITAMIN D 25 Hydroxy (Vit-D Deficiency, Fractures)  Result Value Ref Range   VITD 69.85 30.00 - 100.00 ng/mL  T4, free  Result Value Ref Range   Free T4 0.93 0.60 - 1.60 ng/dL  TSH  Result Value Ref Range   TSH 2.39 0.35 - 5.50 uIU/mL  CBC with Differential/Platelet  Result Value Ref Range   WBC 4.8 4.0 - 10.5 K/uL   RBC 4.03 3.87 - 5.11 Mil/uL   Hemoglobin 12.7 12.0 - 15.0 g/dL  HCT 37.8 36.0 - 46.0 %   MCV 93.7 78.0 - 100.0 fl   MCHC 33.7 30.0 - 36.0 g/dL   RDW 29.5 18.8 - 41.6 %   Platelets 296.0 150.0 - 400.0 K/uL   Neutrophils Relative % 41.5 (L) 43.0 - 77.0 %   Lymphocytes Relative 49.2 (H) 12.0 - 46.0 %    Monocytes Relative 6.9 3.0 - 12.0 %   Eosinophils Relative 1.9 0.0 - 5.0 %   Basophils Relative 0.5 0.0 - 3.0 %   Neutro Abs 2.0 1.4 - 7.7 K/uL   Lymphs Abs 2.4 0.7 - 4.0 K/uL   Monocytes Absolute 0.3 0.1 - 1.0 K/uL   Eosinophils Absolute 0.1 0.0 - 0.7 K/uL   Basophils Absolute 0.0 0.0 - 0.1 K/uL  IBC panel  Result Value Ref Range   Iron 114 42 - 145 ug/dL   Transferrin 606.3 016.0 - 360.0 mg/dL   Saturation Ratios 10.9 20.0 - 50.0 %   TIBC 348.6 250.0 - 450.0 mcg/dL  Ferritin  Result Value Ref Range   Ferritin 110.7 10.0 - 291.0 ng/mL  Vitamin B12  Result Value Ref Range   Vitamin B-12 370 211 - 911 pg/mL  Comprehensive metabolic panel  Result Value Ref Range   Sodium 137 135 - 145 mEq/L   Potassium 4.3 3.5 - 5.1 mEq/L   Chloride 103 96 - 112 mEq/L   CO2 28 19 - 32 mEq/L   Glucose, Bld 104 (H) 70 - 99 mg/dL   BUN 10 6 - 23 mg/dL   Creatinine, Ser 3.23 0.40 - 1.20 mg/dL   Total Bilirubin 0.5 0.2 - 1.2 mg/dL   Alkaline Phosphatase 31 (L) 39 - 117 U/L   AST 26 0 - 37 U/L   ALT 19 0 - 35 U/L   Total Protein 6.2 6.0 - 8.3 g/dL   Albumin 4.2 3.5 - 5.2 g/dL   GFR 55.73 >22.02 mL/min   Calcium 9.7 8.4 - 10.5 mg/dL  Lipid panel  Result Value Ref Range   Cholesterol 102 0 - 200 mg/dL   Triglycerides 542.7 0.0 - 149.0 mg/dL   HDL 06.23 >76.28 mg/dL   VLDL 31.5 0.0 - 17.6 mg/dL   LDL Cholesterol 38 0 - 99 mg/dL   Total CHOL/HDL Ratio 2    NonHDL 60.54   Microalbumin / creatinine urine ratio  Result Value Ref Range   Microalb, Ur 0.9 0.0 - 1.9 mg/dL   Creatinine,U 16.0 mg/dL   Microalb Creat Ratio 1.4 0.0 - 30.0 mg/g  Hemoglobin A1c  Result Value Ref Range   Hgb A1c MFr Bld 6.6 (H) 4.6 - 6.5 %    Assessment & Plan:   Problem List Items Addressed This Visit     Health maintenance examination - Primary (Chronic)    Preventative protocols reviewed and updated unless pt declined. Discussed healthy diet and lifestyle.       Advanced directives, counseling/discussion  (Chronic)    Advanced directives: does not have this set up. Packet provided today- encouraged to continue working on this. HCPOA would be daughter Lamar Sprinkles.       Vitamin B12 deficiency    Chronic, stable period on daily B complex vitamin      Hyperlipidemia associated with type 2 diabetes mellitus (HCC)    Chronic, stable on crestor 20mg  daily - continue. The ASCVD Risk score (Arnett DK, et al., 2019) failed to calculate for the following reasons:   The patient has a prior MI or stroke  diagnosis       Relevant Medications   losartan (COZAAR) 50 MG tablet   GAD (generalized anxiety disorder)    Chronic, stable period off medication.       Essential hypertension    Chronic, stable on current regimen - continue.       Relevant Medications   losartan (COZAAR) 50 MG tablet   GERD, SEVERE    Stable on aciphex + pepcid      Relevant Medications   loperamide (IMODIUM A-D) 2 MG tablet   Irritable bowel syndrome (IBS)    Chronic, severe, stable period on current GI regimen.       Relevant Medications   loperamide (IMODIUM A-D) 2 MG tablet   Osteopenia    Chronic, stable period on calcium/vit D supplementation.       MDD (major depressive disorder), recurrent episode, moderate (HCC)    Stable period off medication.      Abdominal aortic atherosclerosis (HCC)    Continue aspirin, statin.       Relevant Medications   losartan (COZAAR) 50 MG tablet   Recurrent UTI    Sees urology on Hiprex daily      Diet-controlled diabetes mellitus (HCC)    Discussed limiting added sugar/ carbs in diet.  Reassess at 6 mo f/u visit       Relevant Medications   losartan (COZAAR) 50 MG tablet   Coronary artery disease, non-occlusive - Cor CA Score 53.  MIldCAD only.    Appreciate cardiology care       Relevant Medications   losartan (COZAAR) 50 MG tablet   Mild ascending aorta dilation (HCC)    Followed by cardiology       Relevant Medications   losartan (COZAAR) 50  MG tablet   Hearing loss    Regularly wears hearing aides        Meds ordered this encounter  Medications   loperamide (IMODIUM A-D) 2 MG tablet    Sig: Take 1 tablet (2 mg total) by mouth 2 (two) times daily as needed for diarrhea or loose stools.    No orders of the defined types were placed in this encounter.   Patient Instructions  Advanced directive packet provided today  Good to see you today  I'm glad you're doing well    Follow up plan: No follow-ups on file.  Eustaquio Boyden, MD

## 2023-08-05 DIAGNOSIS — R339 Retention of urine, unspecified: Secondary | ICD-10-CM | POA: Diagnosis not present

## 2023-08-20 ENCOUNTER — Other Ambulatory Visit (HOSPITAL_BASED_OUTPATIENT_CLINIC_OR_DEPARTMENT_OTHER): Payer: Self-pay

## 2023-08-29 ENCOUNTER — Ambulatory Visit (HOSPITAL_BASED_OUTPATIENT_CLINIC_OR_DEPARTMENT_OTHER): Payer: Medicare PPO | Admitting: Family

## 2023-09-02 ENCOUNTER — Ambulatory Visit (HOSPITAL_BASED_OUTPATIENT_CLINIC_OR_DEPARTMENT_OTHER): Payer: Medicare PPO | Admitting: Family

## 2023-09-02 VITALS — BP 118/70 | HR 70 | Ht 64.0 in | Wt 146.5 lb

## 2023-09-02 DIAGNOSIS — I5181 Takotsubo syndrome: Secondary | ICD-10-CM | POA: Diagnosis not present

## 2023-09-02 DIAGNOSIS — I25118 Atherosclerotic heart disease of native coronary artery with other forms of angina pectoris: Secondary | ICD-10-CM

## 2023-09-02 DIAGNOSIS — E785 Hyperlipidemia, unspecified: Secondary | ICD-10-CM | POA: Diagnosis not present

## 2023-09-02 DIAGNOSIS — I77810 Thoracic aortic ectasia: Secondary | ICD-10-CM

## 2023-09-02 NOTE — Patient Instructions (Signed)
Medication Instructions:  Your physician recommends that you continue on your current medications as directed. Please refer to the Current Medication list given to you today.  *If you need a refill on your cardiac medications before your next appointment, please call your pharmacy*   Follow-Up: At [facility], you and your health needs are our priority.  As part of our continuing mission to provide you with exceptional heart care, we have created designated Provider Care Teams.  These Care Teams include your primary Cardiologist (physician) and Advanced Practice Providers (APPs -  Physician Assistants and Nurse Practitioners) who all work together to provide you with the care you need, when you need it.  We recommend signing up for the patient portal called "MyChart".  Sign up information is provided on this After Visit Summary.  MyChart is used to connect with patients for Virtual Visits (Telemedicine).  Patients are able to view lab/test results, encounter notes, upcoming appointments, etc.  Non-urgent messages can be sent to your provider as well.   To learn more about what you can do with MyChart, go to [email or web address].    Your next appointment:   6 month(s)  Provider:   Gillian Shields, NP

## 2023-09-02 NOTE — Progress Notes (Unsigned)
Cardiology Office Note:  .   Date:  09/02/2023  ID:  Shelby Vazquez, DOB 1951/07/22, MRN [id number] PCP: Eustaquio Boyden, MD  [facility] Providers Cardiologist:  Bryan Lemma, MD Cardiology APP:  Alver Sorrow, NP { Click to update primary MD,subspecialty MD or APP then REFRESH:1}   History of Present Illness: .   Shelby Vazquez is a 72 y.o. female with a hx of hypertension, aortic atherosclerosis, hyperlipidemia, palpitations, TIA, iron deficiency anemia, GERD, MDD.   Prior cardiac work-up includes echocardiogram 09/2017 revealing normal LVEF 55 to 60%, grade 1 diastolic dysfunction, mild dilation of ascending aorta 42 mm, trivial MR.  She underwent cardiac CTA 09/2017 revealing coronary calcium score of 53 placing her in the 72nd percentile for age and sex pressure control.  She had mild nonobstructive coronary disease (prox LAD 25-50%, LCx 0-25%). Ascending aorta measured 40x76mm.    She was seen 12/20/2021 for preop clearance for removal of deep implant right foot by Ortho.  She had an episode of chest discomfort earlier that week while sitting and watching television.  Chest pain thought to be non cardiac, clearance for surgery provided, and started on low dose Losartan 12.5mg  QD. At follow up 12/24/21 with her PPC dose was increased to 25mg  QD.    Seen 03/19/22 noted fatigue - CMP, CBC, thyroid panel updated which were unremarkable. Seen 07/09/22. She was doing well from a cardiac perspective with LDL of 53 and had enjoyed trip to [place] and [place]. Hospitalized in 08/2022 with Interstim replacement for bladder control. Notes complicated by infection.    Seen 12/20/2022 doing well from a cardiac perspective though BP not at goal and losartan was increased to 50 mg daily.   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  LVEF 35-40% with wall motion abnormalities and grade 1 diastolic dysfunction.  Losartan  transitioned to Entresto and Lopressor transition to Toprol-XL.  Spironolactone also added.  Not SGLT2 candidate due to prior history of bladder infections and sepsis.   She presents today for follow up. She is really enjoying participating in cardiac rehab and is seeing marked improvement in her stamina, exercise tolerance. Reports no shortness of breath nor dyspnea on exertion. Reports no chest pain, pressure, or tightness. No edema, orthopnea, PND. Reports no palpitations.  She is excited for her upcoming echocardiogram to reassess LVEF. ***    ROS: ***  Studies Reviewed: .        *** Risk Assessment/Calculations:             Physical Exam:   VS:  BP 118/70   Pulse 70   Ht 5\' 4"  (1.626 m)   Wt 146 lb 8 oz (66.5 kg)   SpO2 96%   BMI 25.15 kg/m    Wt Readings from Last 3 Encounters:  09/02/23 146 lb 8 oz (66.5 kg)  07/11/23 141 lb 9.6 oz (64.2 kg)  06/25/23 147 lb 11.3 oz (67 kg)    GEN: Well nourished, well developed in no acute distress NECK: No JVD; No carotid bruits CARDIAC: RRR, no murmurs, rubs, gallops RESPIRATORY:  Clear to auscultation without rales, wheezing or rhonchi  ABDOMEN: Soft, non-tender, non-distended EXTREMITIES:  No edema; No deformity   ASSESSMENT AND PLAN: .    Takotsubo cardiomyopathy-LHC 03/06/2023 with normal coronary arteries.  Echo 03/06/2023 LVEF 35-40%. Has repeat echocardiogram scheduled 06/02/23.   GDMT includes Toprol, Entresto, spironolactone.  Defer SGLT2i due to recurrent UTI. No  indication for loop diuretic at this time. Recommend aiming for 150 minutes of moderate intensity activity per week and following a heart healthy diet.       HTN - BP well controlled. Continue current antihypertensive regimen.  Relatively hypotensive but asymptomatic with no lightheadedness, dizziness.    GERD/IBS - Follows with primary care and GI.   HLD, LDL goal <70 - 06/2023 LDL 38. Continue Rosuvastatin 20mg  daily. Denies myalgias.    Palpitations - Well  controlled on present dose Metoprolol.   DM2 - *** Continues to walk regularly aiming for 17 miles per week. Has been eating whole grains and working to limit portions. Offered referral to registered dietican which she politely declines. PCP plans to recheck A1c at visit in March.   MDD - Continue to follow with PCP. Has previously politely declined referral to psychology/psychiatry.   Mild dilation ascending aorta - Echo 2018 42mm. CT angio 09/2019 upper levlel of normal 39mm.  Mild dilation of ascending aorta 41 mm by echo 03/06/2023 and 06/20/23. Repeat echo in 1 year which can be coordinated at follow up.  Continue optimal BP control and metoprolol.             Dispo: follow up in 6 months  Signed, Alver Sorrow, NP

## 2023-09-03 ENCOUNTER — Encounter (HOSPITAL_BASED_OUTPATIENT_CLINIC_OR_DEPARTMENT_OTHER): Payer: Self-pay | Admitting: Family

## 2023-09-08 ENCOUNTER — Other Ambulatory Visit (HOSPITAL_BASED_OUTPATIENT_CLINIC_OR_DEPARTMENT_OTHER): Payer: Self-pay

## 2023-09-08 ENCOUNTER — Other Ambulatory Visit: Payer: Self-pay

## 2023-09-09 ENCOUNTER — Other Ambulatory Visit (HOSPITAL_BASED_OUTPATIENT_CLINIC_OR_DEPARTMENT_OTHER): Payer: Self-pay

## 2023-09-11 DIAGNOSIS — R339 Retention of urine, unspecified: Secondary | ICD-10-CM | POA: Diagnosis not present

## 2023-09-11 DIAGNOSIS — N3281 Overactive bladder: Secondary | ICD-10-CM | POA: Diagnosis not present

## 2023-09-11 DIAGNOSIS — R350 Frequency of micturition: Secondary | ICD-10-CM | POA: Diagnosis not present

## 2023-09-11 DIAGNOSIS — R82998 Other abnormal findings in urine: Secondary | ICD-10-CM | POA: Insufficient documentation

## 2023-09-11 DIAGNOSIS — R3915 Urgency of urination: Secondary | ICD-10-CM | POA: Diagnosis not present

## 2023-09-15 ENCOUNTER — Other Ambulatory Visit (HOSPITAL_BASED_OUTPATIENT_CLINIC_OR_DEPARTMENT_OTHER): Payer: Self-pay

## 2023-09-15 ENCOUNTER — Other Ambulatory Visit: Payer: Self-pay

## 2023-09-15 MED ORDER — CEFDINIR 300 MG PO CAPS
300.0000 mg | ORAL_CAPSULE | Freq: Two times a day (BID) | ORAL | 0 refills | Status: DC
  Filled 2023-09-15: qty 20, 10d supply, fill #0

## 2023-09-26 ENCOUNTER — Other Ambulatory Visit (HOSPITAL_BASED_OUTPATIENT_CLINIC_OR_DEPARTMENT_OTHER): Payer: Self-pay

## 2023-09-26 MED ORDER — CEFDINIR 300 MG PO CAPS
300.0000 mg | ORAL_CAPSULE | Freq: Two times a day (BID) | ORAL | 0 refills | Status: AC
  Filled 2023-09-26: qty 20, 10d supply, fill #0

## 2023-10-09 ENCOUNTER — Other Ambulatory Visit (HOSPITAL_BASED_OUTPATIENT_CLINIC_OR_DEPARTMENT_OTHER): Payer: Self-pay

## 2023-10-09 DIAGNOSIS — N390 Urinary tract infection, site not specified: Secondary | ICD-10-CM | POA: Diagnosis not present

## 2023-10-09 DIAGNOSIS — R350 Frequency of micturition: Secondary | ICD-10-CM | POA: Diagnosis not present

## 2023-10-09 DIAGNOSIS — R109 Unspecified abdominal pain: Secondary | ICD-10-CM | POA: Diagnosis not present

## 2023-10-09 DIAGNOSIS — Z6823 Body mass index (BMI) 23.0-23.9, adult: Secondary | ICD-10-CM | POA: Diagnosis not present

## 2023-10-09 DIAGNOSIS — Z01419 Encounter for gynecological examination (general) (routine) without abnormal findings: Secondary | ICD-10-CM | POA: Diagnosis not present

## 2023-10-09 DIAGNOSIS — R110 Nausea: Secondary | ICD-10-CM | POA: Diagnosis not present

## 2023-10-09 DIAGNOSIS — R1030 Lower abdominal pain, unspecified: Secondary | ICD-10-CM | POA: Diagnosis not present

## 2023-10-09 DIAGNOSIS — R3915 Urgency of urination: Secondary | ICD-10-CM | POA: Diagnosis not present

## 2023-10-09 DIAGNOSIS — Z1231 Encounter for screening mammogram for malignant neoplasm of breast: Secondary | ICD-10-CM | POA: Diagnosis not present

## 2023-10-09 MED ORDER — URO-SP 118 MG PO CAPS
1.0000 | ORAL_CAPSULE | Freq: Four times a day (QID) | ORAL | 99 refills | Status: AC | PRN
  Filled 2023-10-09: qty 120, 30d supply, fill #0

## 2023-10-09 MED ORDER — CEFUROXIME AXETIL 500 MG PO TABS
500.0000 mg | ORAL_TABLET | Freq: Two times a day (BID) | ORAL | 0 refills | Status: AC
  Filled 2023-10-09: qty 20, 10d supply, fill #0

## 2023-10-09 MED ORDER — ONDANSETRON 4 MG PO TBDP
4.0000 mg | ORAL_TABLET | Freq: Three times a day (TID) | ORAL | 1 refills | Status: DC | PRN
  Filled 2023-10-09: qty 30, 10d supply, fill #0
  Filled 2023-10-30 (×2): qty 30, 10d supply, fill #1

## 2023-10-10 ENCOUNTER — Other Ambulatory Visit (HOSPITAL_BASED_OUTPATIENT_CLINIC_OR_DEPARTMENT_OTHER): Payer: Self-pay

## 2023-10-10 ENCOUNTER — Other Ambulatory Visit: Payer: Self-pay

## 2023-10-13 ENCOUNTER — Other Ambulatory Visit (HOSPITAL_BASED_OUTPATIENT_CLINIC_OR_DEPARTMENT_OTHER): Payer: Self-pay

## 2023-10-27 DIAGNOSIS — R3915 Urgency of urination: Secondary | ICD-10-CM | POA: Diagnosis not present

## 2023-10-27 DIAGNOSIS — R110 Nausea: Secondary | ICD-10-CM | POA: Diagnosis not present

## 2023-10-27 DIAGNOSIS — R109 Unspecified abdominal pain: Secondary | ICD-10-CM | POA: Diagnosis not present

## 2023-10-27 DIAGNOSIS — R350 Frequency of micturition: Secondary | ICD-10-CM | POA: Diagnosis not present

## 2023-10-27 DIAGNOSIS — R8290 Unspecified abnormal findings in urine: Secondary | ICD-10-CM | POA: Diagnosis not present

## 2023-10-28 DIAGNOSIS — H43813 Vitreous degeneration, bilateral: Secondary | ICD-10-CM | POA: Diagnosis not present

## 2023-10-28 DIAGNOSIS — H524 Presbyopia: Secondary | ICD-10-CM | POA: Diagnosis not present

## 2023-10-28 DIAGNOSIS — H26492 Other secondary cataract, left eye: Secondary | ICD-10-CM | POA: Diagnosis not present

## 2023-10-28 DIAGNOSIS — E119 Type 2 diabetes mellitus without complications: Secondary | ICD-10-CM | POA: Diagnosis not present

## 2023-10-28 DIAGNOSIS — Z961 Presence of intraocular lens: Secondary | ICD-10-CM | POA: Diagnosis not present

## 2023-10-28 LAB — HM DIABETES EYE EXAM

## 2023-10-29 DIAGNOSIS — N200 Calculus of kidney: Secondary | ICD-10-CM | POA: Diagnosis not present

## 2023-10-30 ENCOUNTER — Other Ambulatory Visit (HOSPITAL_BASED_OUTPATIENT_CLINIC_OR_DEPARTMENT_OTHER): Payer: Self-pay

## 2023-10-30 MED ORDER — CIPROFLOXACIN HCL 500 MG PO TABS
500.0000 mg | ORAL_TABLET | Freq: Two times a day (BID) | ORAL | 0 refills | Status: DC
  Filled 2023-10-30: qty 20, 10d supply, fill #0

## 2023-11-03 DIAGNOSIS — R339 Retention of urine, unspecified: Secondary | ICD-10-CM | POA: Diagnosis not present

## 2023-11-11 ENCOUNTER — Other Ambulatory Visit (HOSPITAL_BASED_OUTPATIENT_CLINIC_OR_DEPARTMENT_OTHER): Payer: Self-pay

## 2023-11-11 DIAGNOSIS — K582 Mixed irritable bowel syndrome: Secondary | ICD-10-CM | POA: Diagnosis not present

## 2023-11-11 DIAGNOSIS — N3281 Overactive bladder: Secondary | ICD-10-CM | POA: Diagnosis not present

## 2023-11-11 DIAGNOSIS — N390 Urinary tract infection, site not specified: Secondary | ICD-10-CM | POA: Diagnosis not present

## 2023-11-11 DIAGNOSIS — N952 Postmenopausal atrophic vaginitis: Secondary | ICD-10-CM | POA: Diagnosis not present

## 2023-11-11 DIAGNOSIS — Z960 Presence of urogenital implants: Secondary | ICD-10-CM | POA: Diagnosis not present

## 2023-11-11 DIAGNOSIS — R82998 Other abnormal findings in urine: Secondary | ICD-10-CM | POA: Diagnosis not present

## 2023-11-11 MED ORDER — ESTRADIOL 0.1 MG/GM VA CREA
TOPICAL_CREAM | VAGINAL | 3 refills | Status: AC
  Filled 2023-11-11: qty 42.5, 90d supply, fill #0

## 2023-11-19 DIAGNOSIS — H26492 Other secondary cataract, left eye: Secondary | ICD-10-CM | POA: Diagnosis not present

## 2023-12-08 ENCOUNTER — Other Ambulatory Visit (HOSPITAL_BASED_OUTPATIENT_CLINIC_OR_DEPARTMENT_OTHER): Payer: Self-pay

## 2023-12-09 ENCOUNTER — Other Ambulatory Visit: Payer: Self-pay

## 2023-12-09 ENCOUNTER — Other Ambulatory Visit (HOSPITAL_BASED_OUTPATIENT_CLINIC_OR_DEPARTMENT_OTHER): Payer: Self-pay

## 2023-12-09 MED ORDER — ONDANSETRON 4 MG PO TBDP
4.0000 mg | ORAL_TABLET | Freq: Three times a day (TID) | ORAL | 1 refills | Status: DC | PRN
  Filled 2023-12-09: qty 30, 10d supply, fill #0
  Filled 2024-02-18: qty 30, 10d supply, fill #1

## 2023-12-22 ENCOUNTER — Ambulatory Visit: Payer: Medicare PPO | Admitting: Family Medicine

## 2023-12-22 ENCOUNTER — Encounter: Payer: Self-pay | Admitting: Family Medicine

## 2023-12-22 ENCOUNTER — Other Ambulatory Visit (HOSPITAL_BASED_OUTPATIENT_CLINIC_OR_DEPARTMENT_OTHER): Payer: Self-pay

## 2023-12-22 VITALS — BP 132/80 | HR 76 | Temp 98.0°F | Ht 64.0 in

## 2023-12-22 DIAGNOSIS — E119 Type 2 diabetes mellitus without complications: Secondary | ICD-10-CM

## 2023-12-22 DIAGNOSIS — R339 Retention of urine, unspecified: Secondary | ICD-10-CM

## 2023-12-22 DIAGNOSIS — K582 Mixed irritable bowel syndrome: Secondary | ICD-10-CM

## 2023-12-22 LAB — POCT GLYCOSYLATED HEMOGLOBIN (HGB A1C): Hemoglobin A1C: 6.1 % — AB (ref 4.0–5.6)

## 2023-12-22 MED ORDER — BLOOD GLUCOSE MONITOR SYSTEM W/DEVICE KIT
1.0000 | PACK | Freq: Three times a day (TID) | 0 refills | Status: AC
  Filled 2023-12-22: qty 1, 30d supply, fill #0

## 2023-12-22 MED ORDER — LANCET DEVICE MISC
1.0000 | Freq: Three times a day (TID) | 0 refills | Status: AC
  Filled 2023-12-22: qty 1, 30d supply, fill #0

## 2023-12-22 MED ORDER — BLOOD GLUCOSE TEST VI STRP
1.0000 | ORAL_STRIP | Freq: Three times a day (TID) | 0 refills | Status: AC
  Filled 2023-12-22: qty 100, 34d supply, fill #0

## 2023-12-22 MED ORDER — LANCETS MISC
1.0000 | Freq: Three times a day (TID) | 0 refills | Status: AC
  Filled 2023-12-22: qty 100, 30d supply, fill #0

## 2023-12-22 NOTE — Assessment & Plan Note (Signed)
 Chronic, severe, Acutely worse after recent antibiotics.  She continues miralax and benefiber as well as prn librax.

## 2023-12-22 NOTE — Assessment & Plan Note (Signed)
 Sugars down into prediabetes range. Congratulated on good sugar control.  Continue low sugar low carb diabetic diet as able.

## 2023-12-22 NOTE — Patient Instructions (Addendum)
 We will request latest mammogram and diabetic eye exam.  Sugars are doing great!  Glucometer prescription sent to pharmacy to use as needed.  Return in 6 months for physical/wellness visit

## 2023-12-22 NOTE — Progress Notes (Signed)
 Ph: [phone] Fax: [phone]   Patient ID: Shelby Vazquez, female    DOB: June 19, 1951, 73 y.o.   MRN: [id number]  This visit was conducted in person.  BP 132/80   Pulse 76   Temp 98 F (36.7 C) (Oral)   Ht 5\' 4"  (1.626 m)   SpO2 99%   BMI 25.15 kg/m    CC: DM f/u visit Subjective:   HPI: Shelby Vazquez is a 73 y.o. 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   Has had a rough several months due to recurrent UTI with worsening GI IBS symptoms due to treatment antibiotics.   Regularly sees urology for overactive bladder with vaginal pessary use, last seen 10/2023. CIC nightly, likely chronically catheterized, latest UCx grew pseudomonas.   DM - does not regularly check sugars. Compliant with antihyperglycemic regimen which includes: diet controlled. Denies low sugars or hypoglycemic symptoms. Denies paresthesias, blurry vision. Last diabetic eye exam 10/2023 Emily Filbert) - records requested. Glucometer brand: doesn't have. Last foot exam: DUE. DSME: declined.  Lab Results  Component Value Date   HGBA1C 6.1 (A) 12/22/2023   Diabetic Foot Exam - Simple   Simple Foot Form Diabetic Foot exam was performed with the following findings: Yes 12/22/2023  8:28 AM  Visual Inspection No deformities, no ulcerations, no other skin breakdown bilaterally: Yes Sensation Testing Intact to touch and monofilament testing bilaterally: Yes Pulse Check Posterior Tibialis and Dorsalis pulse intact bilaterally: Yes Comments No claudication    Lab Results  Component Value Date   MICROALBUR 0.9 07/04/2023         Relevant past medical, surgical, family and social history reviewed and updated as indicated. Interim medical history since our last visit reviewed. Allergies and medications reviewed and updated. Outpatient  Medications Prior to Visit  Medication Sig Dispense Refill   acetaminophen (TYLENOL) 500 MG tablet Take 500 mg by mouth every 6 (six) hours as needed.     aspirin 81 MG tablet Take 1 tablet (81 mg total) by mouth daily. 30 tablet    Calcium Carbonate-Vitamin D 600-200 MG-UNIT CAPS Take 1 capsule by mouth daily.     ciprofloxacin (CIPRO) 500 MG tablet Take 1 tablet (500 mg total) by mouth 2 (two) times daily. 20 tablet 0   clidinium-chlordiazePOXIDE (LIBRAX) 5-2.5 MG capsule Take 1 capsule by mouth 2 (two) times daily at 8 am and 10 pm. 180 capsule 3   estradiol (ESTRACE) 0.1 MG/GM vaginal cream Apply a pea sized amount to the tip of a clean finger and apply around urethra once daily for a week and then every 72 hours after. 42.5 g 3   Estradiol-Estriol-Progesterone (BIEST/PROGESTERONE TD) Place 2 mLs onto the skin in the morning and at bedtime. Apply to inner thighs or arms     famotidine (PEPCID) 40 MG tablet Take 1 tablet (40 mg total) by mouth daily at bedtime. 90 tablet 3   loperamide (IMODIUM A-D) 2 MG tablet Take 1 tablet (2 mg total) by mouth 2 (two) times daily as needed for diarrhea or loose stools.     loratadine (CLARITIN) 10 MG tablet Take 10 mg by mouth daily.     losartan (COZAAR) 50 MG tablet      Meth-Hyo-M Bl-Na Phos-Ph Sal (URO-SP) 118 MG CAPS Take 1 capsule by mouth every 6 (six) hours  as needed (dysuria / bladder pain). 120 capsule 99   methenamine (HIPREX) 1 g tablet Take 1 tablet (1 g total) by mouth 2 (two) times daily. 180 tablet 3   metoprolol succinate (TOPROL-XL) 100 MG 24 hr tablet Take 1 tablet (100 mg total) by mouth daily. Take with or immediately following a meal. 90 tablet 3   ondansetron (ZOFRAN-ODT) 4 MG disintegrating tablet Dissolve 1 tablet (4 mg total) on tongue every 8 (eight) hours as needed for nausea or vomiting. 30 tablet 1   Probiotic Product (PROBIOTIC PO) Take by mouth daily. 900 billion     RABEprazole (ACIPHEX) 20 MG tablet Take 1 tablet (20 mg  total) by mouth daily. 90 tablet 3   rosuvastatin (CRESTOR) 20 MG tablet Take 1 tablet (20 mg total) by mouth every evening. 90 tablet 3   sacubitril-valsartan (ENTRESTO) 24-26 MG Take 1 tablet by mouth 2 (two) times daily. 180 tablet 3   spironolactone (ALDACTONE) 25 MG tablet Take 0.5 tablets (12.5 mg total) by mouth daily. 45 tablet 3   Vitamin B Complex-C CAPS Take 1 capsule by mouth daily.     Wheat Dextrin (BENEFIBER DRINK MIX PO) Take by mouth.     ondansetron (ZOFRAN-ODT) 4 MG disintegrating tablet DISSOLVE 1 TABLET(4 MG) ON THE TONGUE EVERY 8 HOURS AS NEEDED FOR NAUSEA OR VOMITING 30 tablet 00   No facility-administered medications prior to visit.     Per HPI unless specifically indicated in ROS section below Review of Systems  Objective:  BP 132/80   Pulse 76   Temp 98 F (36.7 C) (Oral)   Ht 5\' 4"  (1.626 m)   SpO2 99%   BMI 25.15 kg/m   Wt Readings from Last 3 Encounters:  09/02/23 146 lb 8 oz (66.5 kg)  07/11/23 141 lb 9.6 oz (64.2 kg)  06/25/23 147 lb 11.3 oz (67 kg)      Physical Exam Vitals and nursing note reviewed.  Constitutional:      Appearance: Normal appearance. She is not ill-appearing.  HENT:     Mouth/Throat:     Mouth: Mucous membranes are moist.     Pharynx: Oropharynx is clear. No oropharyngeal exudate or posterior oropharyngeal erythema.  Eyes:     Extraocular Movements: Extraocular movements intact.     Conjunctiva/sclera: Conjunctivae normal.     Pupils: Pupils are equal, round, and reactive to light.  Cardiovascular:     Rate and Rhythm: Normal rate and regular rhythm.     Pulses: Normal pulses.     Heart sounds: Normal heart sounds. No murmur heard. Pulmonary:     Effort: Pulmonary effort is normal. No respiratory distress.     Breath sounds: Normal breath sounds. No wheezing, rhonchi or rales.  Musculoskeletal:     Right lower leg: No edema.     Left lower leg: No edema.  Skin:    General: Skin is warm and dry.     Findings: No  rash.  Neurological:     Mental Status: She is alert.  Psychiatric:        Mood and Affect: Mood normal.        Behavior: Behavior normal.       Results for orders placed or performed in visit on 12/22/23  POCT glycosylated hemoglobin (Hb A1C)   Collection Time: 12/22/23  8:18 AM  Result Value Ref Range   Hemoglobin A1C 6.1 (A) 4.0 - 5.6 %   HbA1c POC (<> result, manual entry)  HbA1c, POC (prediabetic range)     HbA1c, POC (controlled diabetic range)     Lab Results  Component Value Date   VITAMINB12 370 07/04/2023   Assessment & Plan:   Problem List Items Addressed This Visit     Irritable bowel syndrome (IBS)   Chronic, severe, Acutely worse after recent antibiotics.  She continues miralax and benefiber as well as prn librax.       Urinary retention with incomplete bladder emptying   Regularly sees Dr Logan Bores uro, on Hiprex and uro-SP, s/p interstim  CIC nightly       Diet-controlled diabetes mellitus (HCC) - Primary   Sugars down into prediabetes range. Congratulated on good sugar control.  Continue low sugar low carb diabetic diet as able.       Relevant Orders   POCT glycosylated hemoglobin (Hb A1C) (Completed)     Meds ordered this encounter  Medications   Blood Glucose Monitoring Suppl (BLOOD GLUCOSE MONITOR SYSTEM) w/Device KIT    Sig: Use to test blood sugar 3 times daily    Dispense:  1 kit    Refill:  0   Glucose Blood (BLOOD GLUCOSE TEST STRIPS) STRP    Sig: Use to test blood sugar 3 times daily    Dispense:  100 strip    Refill:  0   Lancet Device MISC    Sig: Use to test blood sugar 3 times daily    Dispense:  1 each    Refill:  0   Lancets Misc. MISC    Sig: 1 each by Does not apply route in the morning, at noon, and at bedtime. May substitute to any manufacturer covered by patient's insurance.    Dispense:  100 each    Refill:  0    Orders Placed This Encounter  Procedures   POCT glycosylated hemoglobin (Hb A1C)    Patient  Instructions  We will request latest mammogram and diabetic eye exam.  Sugars are doing great!  Glucometer prescription sent to pharmacy to use as needed.  Return in 6 months for physical/wellness visit   Follow up plan: Return in about 6 months (around 06/23/2024) for annual exam, prior fasting for blood work.  Eustaquio Boyden, MD

## 2023-12-22 NOTE — Assessment & Plan Note (Addendum)
 Regularly sees Dr Logan Bores uro, on Hiprex and uro-SP, s/p interstim  CIC nightly

## 2023-12-23 ENCOUNTER — Encounter: Payer: Self-pay | Admitting: Family Medicine

## 2024-02-11 ENCOUNTER — Telehealth: Payer: Self-pay | Admitting: Internal Medicine

## 2024-02-11 NOTE — Telephone Encounter (Signed)
 Please refer specific Brigitte Canard requests to [place].  Thanks

## 2024-02-11 NOTE — Telephone Encounter (Signed)
 Good afternoon Dr. Elvin Hammer  Doc of Day PM 4/23  We received a call from this patient wishing to establish care to our practice due to being a previous patient of Brigitte Canard, [place]. Patient last saw tina in 04/2023 with [facility]. Before that she was seen in 12/2022 with Dr. Kimble Pennant at [facility] and had a colonoscopy with them in 03/2018. Records from procedures and Office visit are in Epic for you to review. Would you please advise on scheduling?  Thank you.

## 2024-02-18 ENCOUNTER — Encounter: Payer: Self-pay | Admitting: Internal Medicine

## 2024-02-18 ENCOUNTER — Other Ambulatory Visit: Payer: Self-pay

## 2024-02-23 ENCOUNTER — Ambulatory Visit
Admission: EM | Admit: 2024-02-23 | Discharge: 2024-02-23 | Disposition: A | Discharge status: Home / Self Care | Attending: Physician Assistant | Admitting: Physician Assistant

## 2024-02-23 ENCOUNTER — Other Ambulatory Visit (HOSPITAL_BASED_OUTPATIENT_CLINIC_OR_DEPARTMENT_OTHER): Payer: Self-pay

## 2024-02-23 ENCOUNTER — Encounter: Payer: Self-pay | Admitting: Emergency Medicine

## 2024-02-23 DIAGNOSIS — I2510 Atherosclerotic heart disease of native coronary artery without angina pectoris: Secondary | ICD-10-CM | POA: Insufficient documentation

## 2024-02-23 DIAGNOSIS — S61031A Puncture wound without foreign body of right thumb without damage to nail, initial encounter: Secondary | ICD-10-CM | POA: Diagnosis not present

## 2024-02-23 DIAGNOSIS — W540XXA Bitten by dog, initial encounter: Secondary | ICD-10-CM

## 2024-02-23 DIAGNOSIS — R339 Retention of urine, unspecified: Secondary | ICD-10-CM | POA: Diagnosis not present

## 2024-02-23 DIAGNOSIS — H9193 Unspecified hearing loss, bilateral: Secondary | ICD-10-CM | POA: Insufficient documentation

## 2024-02-23 MED ORDER — TETANUS-DIPHTH-ACELL PERTUSSIS 5-2.5-18.5 LF-MCG/0.5 IM SUSY
0.5000 mL | PREFILLED_SYRINGE | Freq: Once | INTRAMUSCULAR | Status: AC
  Administered 2024-02-23: 0.5 mL via INTRAMUSCULAR

## 2024-02-23 MED ORDER — METRONIDAZOLE 500 MG PO TABS
500.0000 mg | ORAL_TABLET | Freq: Three times a day (TID) | ORAL | 0 refills | Status: AC
  Filled 2024-02-23: qty 15, 5d supply, fill #0

## 2024-02-23 MED ORDER — DOXYCYCLINE HYCLATE 100 MG PO CAPS
100.0000 mg | ORAL_CAPSULE | Freq: Two times a day (BID) | ORAL | 0 refills | Status: AC
  Filled 2024-02-23: qty 14, 7d supply, fill #0

## 2024-02-23 NOTE — ED Provider Notes (Signed)
 [facility]    CSN: [id number] Arrival date & time: 02/23/24  2536      History   Chief Complaint Chief Complaint  Patient presents with   Animal Bite    HPI Shelby Vazquez is a 73 y.o. female.   Patient here today for evaluation of dog bite to her right thumb that occurred yesterday.  She reports that she went to feed her family's dog a piece of bread and the dog accidentally bit her.  The dog is up-to-date on vaccinations.  Patient reports this morning she has more throbbing pain in her thumb.  She notes her last tetanus was in 2016.  The history is provided by the patient.  Animal Bite Associated symptoms: no fever and no numbness     Past Medical History:  Diagnosis Date   Abdominal aortic atherosclerosis (HCC) 10/2014   by CT scan   B12 deficiency    Chronic anxiety    Chronic cough    cyclical cough syndrome   Complication of anesthesia 9 yrs ago   awake and in severe pain with colonoscopy   Concussion    Diet-controlled diabetes mellitus (HCC) 02/22/2017   New dx 02/2017   GERD (gastroesophageal reflux disease)    severe leading to chronic cough and hoarseness   History of Epstein-Barr virus infection 2012   Hyperlipidemia    Hypertension    IBS (irritable bowel syndrome)    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 , PB-8 probiotic, healthy diet and exercise   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   By CT 09/2017 & 09/2018 - max diameter 4cm f/u annually    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   had surgery with 5 days of chemotherapy   Osteopenia 01/2015   T -0.8 spine, -1.7 hip   Pyelonephritis due to Escherichia coli 07/16/2017   Uterine prolapse    Vocal cord paralysis, bilateral partial 2001   evaluated at [facility] - did not require surgery    Patient Active Problem List   Diagnosis Date Noted   Coronary arteriosclerosis 02/23/2024   Bilateral hearing loss 02/23/2024   Leukocytes in urine 09/11/2023   Advanced directives,  counseling/discussion 07/11/2023   Stress-induced cardiomyopathy 03/07/2023   Aortic dilatation (HCC) 03/07/2023   Self-catheterizes urinary bladder 07/17/2022   Increased frequency of urination 06/15/2022   Status post implantation of urinary electronic stimulator device 06/15/2022   Urinary urgency 06/15/2022   Health maintenance examination 07/04/2021   Memory difficulties 07/04/2021   Fatigue 07/28/2020   Situational hypertension 07/12/2019   LLQ abdominal pain 03/02/2019   Hearing loss 02/26/2019   Acquired hallux varus 10/20/2018   Hammer toe 10/20/2018   Coronary artery disease, non-occlusive - Cor CA Score 53.  MIldCAD only. 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   Molar pregnancy with choriocarcinoma (HCC)    Complication of anesthesia    Family history of premature coronary artery disease 09/08/2017   Diet-controlled diabetes mellitus (HCC) 02/22/2017   Recurrent UTI 02/17/2017   Retention of urine 10/08/2016   OAB (overactive bladder) 09/03/2016   Urinary retention with incomplete bladder emptying 07/26/2016   Iron deficiency 02/06/2015   Abdominal aortic atherosclerosis (HCC) 10/21/2014   Lower abdominal pain 09/07/2014   Chronic cough - cyclical 12/31/2012   MDD (major depressive disorder), recurrent episode, moderate (HCC) 11/06/2011   Headache 06/12/2011   Vitamin B12 deficiency 11/19/2010   Hyperlipidemia associated with type 2 diabetes mellitus (HCC) 11/19/2010   GAD (generalized  anxiety disorder) 11/19/2010   Essential hypertension 11/19/2010   GERD, SEVERE 11/19/2010   Irritable bowel syndrome (IBS) 11/19/2010   Uterine prolapse 11/19/2010   Osteopenia 11/19/2010   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 Normal right dominant coronary artery system with mild onset of CAD. Aneurysmal dilation of the ascending aorta with maximum diameter diameter of 4.0-3.8 centimeter. -Recommend annual CT/MRA December 2018   DILATION AND CURETTAGE OF UTERUS  '77   for molar pregnancy excision   ESOPHAGOGASTRODUODENOSCOPY N/A 03/31/2014   mult gastric polyps, mild mucosal hemorrhages; ESOPHAGOGASTRODUODENOSCOPY (EGD);  Surgeon: Brice Campi, MD   ESOPHAGOGASTRODUODENOSCOPY ENDOSCOPY  01/2013   INTERSTIM IMPLANT PLACEMENT  2018   LEFT HEART CATH AND CORONARY ANGIOGRAPHY N/A 03/06/2023   Procedure: LEFT HEART CATH AND CORONARY ANGIOGRAPHY;  Surgeon: Millicent Ally, MD;  Location: [facility];  Service: Cardiovascular;  Laterality: N/A;   toe surgery Right 09/2018   TRANSTHORACIC ECHOCARDIOGRAM  09/2017   Normal LV size and function. EF 55-60%. GR 1 DD. No regional wall motion abnormalities. No significant valvular abnormalities.    OB History   No obstetric history on file.      Home Medications    Prior to Admission medications   Medication Sig Start Date End Date Taking? Authorizing Provider  acetaminophen  (TYLENOL ) 500 MG tablet Take 500 mg by mouth every 6 (six) hours as needed.   Yes [provider]  aspirin  81 MG tablet Take 1 tablet (81 mg total) by mouth daily. 08/18/17  Yes Claire Crick, MD  Calcium  Carbonate-Vitamin D  600-200 MG-UNIT CAPS Take 1 capsule by mouth daily.   Yes [provider]  clidinium-chlordiazePOXIDE  (LIBRAX ) 5-2.5 MG capsule Take 1 capsule by mouth 2 (two) times daily at 8 am and 10 pm. 05/15/23 05/09/24 Yes Brigitte Canard, PA-C  doxycycline  (VIBRAMYCIN ) 100 MG capsule Take 1 capsule  (100 mg total) by mouth 2 (two) times daily for 7 days. 02/23/24 03/01/24 Yes Vernestine Gondola, PA-C  estradiol  (ESTRACE ) 0.1 MG/GM vaginal cream Apply a pea sized amount to the tip of a clean finger and apply around urethra once daily for a week and then every 72 hours after. 11/11/23  Yes   Estradiol -Estriol-Progesterone (BIEST/PROGESTERONE TD) Place 2 mLs onto the skin in the morning and at bedtime. Apply to inner thighs or arms   Yes [provider]  famotidine  (PEPCID ) 40 MG tablet Take 1 tablet (40 mg total) by mouth daily at bedtime. 05/15/23 05/09/24 Yes Brigitte Canard, PA-C  loratadine  (CLARITIN ) 10 MG tablet Take 10 mg by mouth daily.   Yes [provider]  metoprolol  succinate (TOPROL -XL) 100 MG 24 hr tablet Take 1 tablet (100 mg total) by mouth daily. Take with or immediately following a meal. 03/24/23  Yes Walker, Caitlin S, NP  metroNIDAZOLE  (FLAGYL ) 500 MG tablet Take 1 tablet (500 mg total) by mouth 3 (three) times daily for 5 days. 02/23/24 02/28/24 Yes Vernestine Gondola,  PA-C  ondansetron  (ZOFRAN -ODT) 4 MG disintegrating tablet DISSOLVE 1 TABLET(4 MG) ON THE TONGUE EVERY 8 HOURS AS NEEDED FOR NAUSEA OR VOMITING 12/17/22  Yes Claire Crick, MD  Probiotic Product (PROBIOTIC PO) Take by mouth daily. 900 billion   Yes [provider]  RABEprazole  (ACIPHEX ) 20 MG tablet Take 1 tablet (20 mg total) by mouth daily. 05/15/23 05/21/24 Yes Brigitte Canard, PA-C  rosuvastatin  (CRESTOR ) 20 MG tablet Take 1 tablet (20 mg total) by mouth every evening. 03/24/23  Yes Walker, Caitlin S, NP  sacubitril -valsartan  (ENTRESTO ) 24-26 MG Take 1 tablet by mouth 2 (two) times daily. 03/24/23  Yes Clearnce Curia, NP  spironolactone  (ALDACTONE ) 25 MG tablet Take 0.5 tablets (12.5 mg total) by mouth daily. 03/24/23  Yes Clearnce Curia, NP  Vitamin B Complex-C CAPS Take 1 capsule by mouth daily.   Yes [provider]  Wheat Dextrin (BENEFIBER DRINK MIX PO) Take by mouth.   Yes [provider]  Blood Glucose Monitoring Suppl (BLOOD GLUCOSE MONITOR SYSTEM) w/Device KIT Use to test blood sugar 3 times daily Patient not taking: Reported on 02/23/2024 12/22/23   Claire Crick, MD  loperamide  (IMODIUM  A-D) 2 MG tablet Take 1 tablet (2 mg total) by mouth 2 (two) times daily as needed for diarrhea or loose stools. 07/11/23   Claire Crick, MD  Meth-Hyo-M Aurea Blossom Phos-Ph Sal (URO-SP) 118 MG CAPS Take 1 capsule by mouth every 6 (six) hours as needed (dysuria / bladder pain). 10/09/23       Family History Family History  Problem Relation Age of Onset   Cancer Father        Esophagus   Heart disease Mother 38   Hyperlipidemia Mother    Hypertension Mother    Heart attack Mother 51   Cancer Maternal Aunt        breast   Heart attack Maternal Grandmother    Heart attack Maternal Grandfather    Heart disease Paternal Grandmother    Heart disease Paternal Grandfather    Hyperlipidemia Brother    Hypertension Brother    Diabetes Neg Hx     Social History Social History   Tobacco Use   Smoking status: Never   Smokeless tobacco: Never  Vaping Use   Vaping status: Never Used  Substance Use Topics   Alcohol use: Not Currently    Comment: twice a year   Drug use: No     Allergies   Ciprofloxacin , Sulfamethoxazole-trimethoprim, Augmentin  [amoxicillin -pot clavulanate], and Erythromycin   Review of Systems Review of Systems  Constitutional:  Negative for chills and fever.  Eyes:  Negative for discharge and redness.  Respiratory:  Negative for shortness of breath.   Gastrointestinal:  Negative for abdominal pain, nausea and vomiting.  Skin:  Positive for color change and wound.  Neurological:  Negative for numbness.     Physical Exam Triage Vital Signs ED Triage Vitals [02/23/24 0907]  Encounter Vitals Group     BP (!) 163/89     Systolic BP Percentile      Diastolic BP Percentile      Pulse Rate 79     Resp 16     Temp 97.9 F (36.6 C)     Temp  Source Oral     SpO2 97 %     Weight      Height      Head Circumference      Peak Flow      Pain Score 1  Pain Loc      Pain Education      Exclude from Growth Chart    No data found.  Updated Vital Signs BP 130/87 (BP Location: Left Arm)   Pulse 79   Temp 97.9 F (36.6 C) (Oral)   Resp 16   SpO2 97%   Visual Acuity Right Eye Distance:   Left Eye Distance:   Bilateral Distance:    Right Eye Near:   Left Eye Near:    Bilateral Near:     Physical Exam Vitals and nursing note reviewed.  Constitutional:      General: She is not in acute distress.    Appearance: Normal appearance. She is not ill-appearing.  HENT:     Head: Normocephalic and atraumatic.  Eyes:     Conjunctiva/sclera: Conjunctivae normal.  Cardiovascular:     Rate and Rhythm: Normal rate.  Pulmonary:     Effort: Pulmonary effort is normal. No respiratory distress.  Skin:    Capillary Refill: Normal cap refill to right thumb    Comments: 2 puncture wounds noted to distal right thumb without bleeding or drainage.  Mild diffuse swelling and erythema distally  Neurological:     Mental Status: She is alert.     Comments: Gross sensation intact to distal right thumb  Psychiatric:        Mood and Affect: Mood normal.        Behavior: Behavior normal.        Thought Content: Thought content normal.      UC Treatments / Results  Labs (all labs ordered are listed, but only abnormal results are displayed) Labs Reviewed - No data to display  EKG   Radiology No results found.  Procedures Procedures (including critical care time)  Medications Ordered in UC Medications  Tdap (BOOSTRIX) injection 0.5 mL (0.5 mLs Intramuscular Given 02/23/24 0938)    Initial Impression / Assessment and Plan / UC Course  I have reviewed the triage vital signs and the nursing notes.  Pertinent labs & imaging results that were available during my care of the patient were reviewed by me and considered in my  medical decision making (see chart for details).    Treat with antibiotics to cover dog bite prophylactically.  Discussed options for repeating tetanus given duration since last vaccination and patient would like to proceed with tetanus vaccine today.  Recommended she continue to monitor and follow-up with any further concerns.  Final Clinical Impressions(s) / UC Diagnoses   Final diagnoses:  Dog bite, initial encounter  Puncture wound of right thumb, initial encounter   Discharge Instructions   None    ED Prescriptions     Medication Sig Dispense Auth. Provider   doxycycline  (VIBRAMYCIN ) 100 MG capsule Take 1 capsule (100 mg total) by mouth 2 (two) times daily for 7 days. 14 capsule Jami Mcclintock F, PA-C   metroNIDAZOLE  (FLAGYL ) 500 MG tablet Take 1 tablet (500 mg total) by mouth 3 (three) times daily for 5 days. 15 tablet Vernestine Gondola, PA-C      PDMP not reviewed this encounter.   Vernestine Gondola, PA-C 02/23/24 1021

## 2024-02-23 NOTE — ED Triage Notes (Addendum)
 Pt reports dog bite that occurred at 5:30pm yesterday. It is a family dog UTD on vaccines. Pt tried to self treat at home, but woke up with throbbing pain early this morning. R thumb has 2 puncture wounds per pt, wrapped currently. Last tetanus in 2016 per pt.

## 2024-03-09 ENCOUNTER — Other Ambulatory Visit (HOSPITAL_BASED_OUTPATIENT_CLINIC_OR_DEPARTMENT_OTHER): Payer: Self-pay | Admitting: Family

## 2024-03-09 DIAGNOSIS — I5181 Takotsubo syndrome: Secondary | ICD-10-CM

## 2024-03-09 DIAGNOSIS — E785 Hyperlipidemia, unspecified: Secondary | ICD-10-CM

## 2024-03-10 ENCOUNTER — Other Ambulatory Visit (HOSPITAL_BASED_OUTPATIENT_CLINIC_OR_DEPARTMENT_OTHER): Payer: Self-pay

## 2024-03-10 MED ORDER — SPIRONOLACTONE 25 MG PO TABS
12.5000 mg | ORAL_TABLET | Freq: Every day | ORAL | 1 refills | Status: DC
  Filled 2024-03-10: qty 45, 90d supply, fill #0
  Filled 2024-06-16: qty 45, 90d supply, fill #1

## 2024-03-17 ENCOUNTER — Other Ambulatory Visit (HOSPITAL_BASED_OUTPATIENT_CLINIC_OR_DEPARTMENT_OTHER): Payer: Self-pay

## 2024-03-17 ENCOUNTER — Other Ambulatory Visit (HOSPITAL_BASED_OUTPATIENT_CLINIC_OR_DEPARTMENT_OTHER): Payer: Self-pay | Admitting: Family

## 2024-03-17 DIAGNOSIS — I5181 Takotsubo syndrome: Secondary | ICD-10-CM

## 2024-03-17 DIAGNOSIS — E785 Hyperlipidemia, unspecified: Secondary | ICD-10-CM

## 2024-03-17 MED ORDER — METOPROLOL SUCCINATE ER 100 MG PO TB24
100.0000 mg | ORAL_TABLET | Freq: Every day | ORAL | 1 refills | Status: DC
Start: 2024-03-17 — End: 2024-09-07
  Filled 2024-03-17: qty 90, 90d supply, fill #0
  Filled 2024-06-16: qty 90, 90d supply, fill #1

## 2024-03-17 MED ORDER — ROSUVASTATIN CALCIUM 20 MG PO TABS
20.0000 mg | ORAL_TABLET | Freq: Every evening | ORAL | 1 refills | Status: DC
  Filled 2024-03-17: qty 90, 90d supply, fill #0
  Filled 2024-06-16: qty 90, 90d supply, fill #1

## 2024-03-18 ENCOUNTER — Other Ambulatory Visit (HOSPITAL_BASED_OUTPATIENT_CLINIC_OR_DEPARTMENT_OTHER): Payer: Self-pay

## 2024-03-21 ENCOUNTER — Other Ambulatory Visit (HOSPITAL_BASED_OUTPATIENT_CLINIC_OR_DEPARTMENT_OTHER): Payer: Self-pay | Admitting: Family

## 2024-03-21 DIAGNOSIS — I5181 Takotsubo syndrome: Secondary | ICD-10-CM

## 2024-03-21 DIAGNOSIS — E785 Hyperlipidemia, unspecified: Secondary | ICD-10-CM

## 2024-03-22 ENCOUNTER — Other Ambulatory Visit (HOSPITAL_BASED_OUTPATIENT_CLINIC_OR_DEPARTMENT_OTHER): Payer: Self-pay

## 2024-03-22 MED ORDER — ENTRESTO 24-26 MG PO TABS
1.0000 | ORAL_TABLET | Freq: Two times a day (BID) | ORAL | 0 refills | Status: DC
  Filled 2024-03-22: qty 60, 30d supply, fill #0

## 2024-04-05 DIAGNOSIS — R3915 Urgency of urination: Secondary | ICD-10-CM | POA: Diagnosis not present

## 2024-04-05 DIAGNOSIS — R350 Frequency of micturition: Secondary | ICD-10-CM | POA: Diagnosis not present

## 2024-04-05 DIAGNOSIS — K5900 Constipation, unspecified: Secondary | ICD-10-CM | POA: Diagnosis not present

## 2024-04-14 ENCOUNTER — Other Ambulatory Visit (HOSPITAL_BASED_OUTPATIENT_CLINIC_OR_DEPARTMENT_OTHER): Payer: Self-pay

## 2024-04-14 MED ORDER — METHENAMINE HIPPURATE 1 G PO TABS
1.0000 g | ORAL_TABLET | Freq: Two times a day (BID) | ORAL | 3 refills | Status: AC
  Filled 2024-04-14: qty 180, 90d supply, fill #0
  Filled 2024-07-18: qty 180, 90d supply, fill #1
  Filled 2024-10-16: qty 180, 90d supply, fill #2

## 2024-04-16 ENCOUNTER — Ambulatory Visit: Admitting: Internal Medicine

## 2024-04-16 ENCOUNTER — Other Ambulatory Visit (HOSPITAL_BASED_OUTPATIENT_CLINIC_OR_DEPARTMENT_OTHER): Payer: Self-pay

## 2024-04-16 ENCOUNTER — Encounter: Payer: Self-pay | Admitting: Internal Medicine

## 2024-04-16 VITALS — BP 120/80 | HR 72 | Ht 63.5 in | Wt 140.4 lb

## 2024-04-16 DIAGNOSIS — K581 Irritable bowel syndrome with constipation: Secondary | ICD-10-CM

## 2024-04-16 DIAGNOSIS — K219 Gastro-esophageal reflux disease without esophagitis: Secondary | ICD-10-CM | POA: Diagnosis not present

## 2024-04-16 DIAGNOSIS — Z860100 Personal history of colon polyps, unspecified: Secondary | ICD-10-CM

## 2024-04-16 DIAGNOSIS — Z860101 Personal history of adenomatous and serrated colon polyps: Secondary | ICD-10-CM

## 2024-04-16 DIAGNOSIS — K580 Irritable bowel syndrome with diarrhea: Secondary | ICD-10-CM

## 2024-04-16 MED ORDER — CILIDINIUM-CHLORDIAZEPOXIDE 2.5-5 MG PO CAPS: 1.0000 | ORAL_CAPSULE | Freq: Two times a day (BID) | ORAL | 3 refills | Status: AC

## 2024-04-16 MED ORDER — NA SULFATE-K SULFATE-MG SULF 17.5-3.13-1.6 GM/177ML PO SOLN: 1.0000 | Freq: Once | ORAL | 0 refills | Status: AC

## 2024-04-16 MED ORDER — NA SULFATE-K SULFATE-MG SULF 17.5-3.13-1.6 GM/177ML PO SOLN
1.0000 | Freq: Once | ORAL | 0 refills | Status: AC
Start: 2024-04-16 — End: 2024-04-20
  Filled 2024-04-16: qty 354, 1d supply, fill #0

## 2024-04-16 NOTE — Patient Instructions (Signed)
 Continue stool softeners twice a day.  Take 1 capful of Miralax  in liquid daily.  You have been scheduled for a colonoscopy. Please follow written instructions given to you at your visit today.   If you use inhalers (even only as needed), please bring them with you on the day of your procedure.  DO NOT TAKE 7 DAYS PRIOR TO TEST- Trulicity (dulaglutide) Ozempic, Wegovy (semaglutide) Mounjaro (tirzepatide) Bydureon Bcise (exanatide extended release)  DO NOT TAKE 1 DAY PRIOR TO YOUR TEST Rybelsus (semaglutide) Adlyxin (lixisenatide) Victoza (liraglutide) Byetta (exanatide) ___________________________________________________________________________  _______________________________________________________  If your blood pressure at your visit was 140/90 or greater, please contact your primary care physician to follow up on this.  _______________________________________________________  If you are age 23 or older, your body mass index should be between 23-30. Your Body mass index is 24.48 kg/m. If this is out of the aforementioned range listed, please consider follow up with your Primary Care Provider.  If you are age 87 or younger, your body mass index should be between 19-25. Your Body mass index is 24.48 kg/m. If this is out of the aformentioned range listed, please consider follow up with your Primary Care Provider.   ________________________________________________________  The [facility] providers would like to encourage you to use MYCHART to communicate with providers for non-urgent requests or questions.  Due to long hold times on the telephone, sending your provider a message by [facility] may be a faster and more efficient way to get a response.  Please allow 48 business hours for a response.  Please remember that this is for non-urgent requests.  _______________________________________________________

## 2024-04-20 ENCOUNTER — Encounter: Payer: Self-pay | Admitting: Internal Medicine

## 2024-04-20 NOTE — Progress Notes (Signed)
 HISTORY OF PRESENT ILLNESS:  Shelby Vazquez is a 73 y.o. female with multiple medical problems as listed below who presents to this clinic to establish GI care.  Multiple prior gastroenterologist elsewhere including [facility] and [facility].  Had seen Ellouise Console, PA-C in that office.  GI issues include IBS and GERD as well as a history of colon polyps.  The patient has a myriad of complaints today.  She describes bowel habits that have alternated between diarrhea and most recently constipation.  She is quite detailed about the history of her bowel habits as well as urologic issues and treatment that have occurred.  She is a bit of a rambling historian.  She did have a history of Takotsubo cardiomyopathy for which she has recovered with most recent ejection fraction 60 to 65%.  For chronic GERD she takes Aciphex .  As mentioned, she does have a history of adenomatous polyps.  Last colonoscopy with Dr. Donnald 12/18/2017.  5-year follow-up recommended.  She was also noted to have sigmoid diverticulosis and a normal terminal ileum.  In terms of her bowels, she has been using 2 Colace at night.  Previously, MiraLAX  sporadically.    REVIEW OF SYSTEMS:  All non-GI ROS negative unless otherwise stated in the HPI except for anxiety, hearing problems  Past Medical History:  Diagnosis Date   Abdominal aortic atherosclerosis (HCC) 10/2014   by CT scan   Anemia    Anxiety    B12 deficiency    Chronic anxiety    Chronic cough    cyclical cough syndrome   Complication of anesthesia 9 yrs ago   awake and in severe pain with colonoscopy   Concussion    Depression    Diet-controlled diabetes mellitus (HCC) 02/22/2017   New dx 02/2017   Diverticulosis    GERD (gastroesophageal reflux disease)    severe leading to chronic cough and hoarseness   History of Epstein-Barr virus infection 2012   Hyperlipidemia    Hypertension    IBS (irritable bowel syndrome)    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 , PB-8 probiotic,  healthy diet and exercise   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   By CT 09/2017 & 09/2018 - max diameter 4cm f/u annually    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   had surgery with 5 days of chemotherapy   Osteopenia 01/2015   T -0.8 spine, -1.7 hip   Pyelonephritis due to Escherichia coli 07/16/2017   Trophoblastic disease    Uterine prolapse    Vocal cord paralysis, bilateral partial 2001   evaluated at [facility] - did not require surgery    Past Surgical History:  Procedure Laterality Date   BUNIONECTOMY     Regal '2000   CARDIAC CATHETERIZATION     CATARACT EXTRACTION     right 01/2010, left may 2011   COLONOSCOPY  03/2018   diverticulosis, rpt 5 yrs ([name])   COLONOSCOPY WITH PROPOFOL  N/A 03/31/2014   sessile serrated adenomas, sigmoid diverticulosis, rpt 3 yrs Beverlie Donnald GAILS, MD)   CORONARY CALCIUM  SCORE-CORONARY CTA  09/2017   Calcium  score 53 . Normal right dominant coronary artery system with mild onset of CAD. Aneurysmal dilation of the ascending aorta with maximum diameter diameter of 4.0-3.8 centimeter. -Recommend annual CT/MRA December 2018   DILATION AND CURETTAGE OF UTERUS  '77   for molar pregnancy excision   ESOPHAGOGASTRODUODENOSCOPY N/A 03/31/2014   mult gastric polyps, mild mucosal hemorrhages; ESOPHAGOGASTRODUODENOSCOPY (EGD);  Surgeon: Lamar Donnald GAILS,  MD   ESOPHAGOGASTRODUODENOSCOPY ENDOSCOPY  01/2013   INTERSTIM IMPLANT PLACEMENT  2018   LEFT HEART CATH AND CORONARY ANGIOGRAPHY N/A 03/06/2023   Procedure: LEFT HEART CATH AND CORONARY ANGIOGRAPHY;  Surgeon: Burnard Debby LABOR, MD;  Location: [facility];  Service: Cardiovascular;  Laterality: N/A;   toe surgery Right 09/2018   TRANSTHORACIC ECHOCARDIOGRAM  09/2017   Normal LV size and function. EF 55-60%. GR 1 DD. No regional wall motion abnormalities. No significant valvular abnormalities.    Social History Shelby Vazquez  reports that she has never smoked. She has  never used smokeless tobacco. She reports that she does not currently use alcohol. She reports that she does not use drugs.  family history includes Breast cancer in her maternal aunt; Esophageal cancer in her father; Heart attack in her maternal grandfather and maternal grandmother; Heart attack (age of onset: 31) in her mother; Heart disease in her paternal grandfather and paternal grandmother; Heart disease (age of onset: 67) in her mother; Hyperlipidemia in her brother and mother; Hypertension in her brother and mother; Stomach cancer in her father.  Allergies  Allergen Reactions   Ciprofloxacin  Nausea And Vomiting   Sulfamethoxazole-Trimethoprim Nausea And Vomiting   Augmentin  [Amoxicillin -Pot Clavulanate] Other (See Comments)    Severe diarrhea, rash, worsened acid reflux   Erythromycin Rash       PHYSICAL EXAMINATION: Vital signs: BP 120/80 (BP Location: Left Arm, Patient Position: Sitting, Cuff Size: Normal)   Pulse 72   Ht 5' 3.5 (1.613 m) Comment: height measured without shoes  Wt 140 lb 6 oz (63.7 kg)   BMI 24.48 kg/m   Constitutional: generally well-appearing, no acute distress Psychiatric: alert and oriented x3, cooperative Eyes: extraocular movements intact, anicteric, conjunctiva pink Mouth: oral pharynx moist, no lesions Neck: supple no lymphadenopathy Cardiovascular: heart regular rate and rhythm, no murmur Lungs: clear to auscultation bilaterally Abdomen: soft, nontender, nondistended, no obvious ascites, no peritoneal signs, normal bowel sounds, no organomegaly Rectal: Deferred until colonoscopy Extremities: no clubbing, cyanosis, or lower extremity edema bilaterally Skin: no lesions on visible extremities Neuro: No focal deficits.  Cranial nerves intact  ASSESSMENT:  1.  Irritable bowel syndrome.  Currently with constipation predominance 2.  GERD.  On Aciphex  and famotidine  3.  History of adenomatous colon polyps.  Last colonoscopy 2019, elsewhere.  Due for  surveillance 4.  General Medical problems.  Stable 5.  Takotsubo cardiomyopathy--resolved   PLAN:  1.  Continue Colace 2 daily 2.  Add MiraLAX  1 capful (17 g) daily for 1 week.  Thereafter, titrate to desired effect 3.  Reflux precautions 4.  Continue Aciphex  and famotidine  5.  Schedule surveillance colonoscopy.The nature of the procedure, as well as the risks, benefits, and alternatives were carefully and thoroughly reviewed with the patient. Ample time for discussion and questions allowed. The patient understood, was satisfied, and agreed to proceed. 6.  Ongoing continuity of care with Ellouise Console, PA-C post colonoscopy A total time of 60 minutes was spent preparing to see the patient, reviewing multiple outside records, obtaining comprehensive history, performing medically appropriate physical examination, counseling and educating the patient regarding the above listed issues, ordering endoscopic procedure, directing symptomatic therapies, and documenting clinical information in the health records

## 2024-04-22 ENCOUNTER — Other Ambulatory Visit (HOSPITAL_BASED_OUTPATIENT_CLINIC_OR_DEPARTMENT_OTHER): Payer: Self-pay | Admitting: Cardiology

## 2024-04-22 ENCOUNTER — Other Ambulatory Visit (HOSPITAL_BASED_OUTPATIENT_CLINIC_OR_DEPARTMENT_OTHER): Payer: Self-pay

## 2024-04-22 DIAGNOSIS — E785 Hyperlipidemia, unspecified: Secondary | ICD-10-CM

## 2024-04-22 DIAGNOSIS — I5181 Takotsubo syndrome: Secondary | ICD-10-CM

## 2024-04-22 MED ORDER — SACUBITRIL-VALSARTAN 24-26 MG PO TABS
1.0000 | ORAL_TABLET | Freq: Two times a day (BID) | ORAL | 1 refills | Status: DC
  Filled 2024-04-22: qty 180, 90d supply, fill #0
  Filled 2024-07-18: qty 180, 90d supply, fill #1

## 2024-05-14 ENCOUNTER — Other Ambulatory Visit: Payer: Self-pay | Admitting: Physician Assistant

## 2024-05-14 ENCOUNTER — Other Ambulatory Visit (HOSPITAL_BASED_OUTPATIENT_CLINIC_OR_DEPARTMENT_OTHER): Payer: Self-pay

## 2024-05-14 DIAGNOSIS — K219 Gastro-esophageal reflux disease without esophagitis: Secondary | ICD-10-CM

## 2024-05-14 MED ORDER — RABEPRAZOLE SODIUM 20 MG PO TBEC
20.0000 mg | DELAYED_RELEASE_TABLET | Freq: Every day | ORAL | 3 refills | Status: AC
  Filled 2024-05-14: qty 90, 90d supply, fill #0
  Filled 2024-08-14: qty 90, 90d supply, fill #1
  Filled 2024-11-01: qty 90, 90d supply, fill #2

## 2024-05-22 ENCOUNTER — Other Ambulatory Visit: Payer: Self-pay | Admitting: Physician Assistant

## 2024-05-22 ENCOUNTER — Encounter (HOSPITAL_BASED_OUTPATIENT_CLINIC_OR_DEPARTMENT_OTHER): Payer: Self-pay

## 2024-05-22 DIAGNOSIS — K219 Gastro-esophageal reflux disease without esophagitis: Secondary | ICD-10-CM

## 2024-05-24 ENCOUNTER — Other Ambulatory Visit (HOSPITAL_BASED_OUTPATIENT_CLINIC_OR_DEPARTMENT_OTHER): Payer: Self-pay

## 2024-05-24 DIAGNOSIS — R339 Retention of urine, unspecified: Secondary | ICD-10-CM | POA: Diagnosis not present

## 2024-05-24 MED ORDER — FAMOTIDINE 40 MG PO TABS
40.0000 mg | ORAL_TABLET | Freq: Every day | ORAL | 3 refills | Status: AC
  Filled 2024-05-24: qty 90, 90d supply, fill #0
  Filled 2024-08-16: qty 90, 90d supply, fill #1
  Filled 2024-11-12: qty 90, 90d supply, fill #2

## 2024-06-01 ENCOUNTER — Encounter: Payer: Self-pay | Admitting: Internal Medicine

## 2024-06-08 ENCOUNTER — Ambulatory Visit (AMBULATORY_SURGERY_CENTER): Admitting: Internal Medicine

## 2024-06-08 ENCOUNTER — Encounter: Payer: Self-pay | Admitting: Internal Medicine

## 2024-06-08 VITALS — BP 124/67 | HR 77 | Temp 98.1°F | Resp 14 | Ht 63.5 in | Wt 140.0 lb

## 2024-06-08 DIAGNOSIS — Z860100 Personal history of colon polyps, unspecified: Secondary | ICD-10-CM | POA: Diagnosis not present

## 2024-06-08 DIAGNOSIS — K648 Other hemorrhoids: Secondary | ICD-10-CM

## 2024-06-08 DIAGNOSIS — K581 Irritable bowel syndrome with constipation: Secondary | ICD-10-CM | POA: Diagnosis not present

## 2024-06-08 DIAGNOSIS — Z860101 Personal history of adenomatous and serrated colon polyps: Secondary | ICD-10-CM

## 2024-06-08 DIAGNOSIS — D125 Benign neoplasm of sigmoid colon: Secondary | ICD-10-CM | POA: Diagnosis not present

## 2024-06-08 DIAGNOSIS — Z1211 Encounter for screening for malignant neoplasm of colon: Secondary | ICD-10-CM

## 2024-06-08 DIAGNOSIS — K5730 Diverticulosis of large intestine without perforation or abscess without bleeding: Secondary | ICD-10-CM | POA: Diagnosis not present

## 2024-06-08 DIAGNOSIS — I10 Essential (primary) hypertension: Secondary | ICD-10-CM | POA: Diagnosis not present

## 2024-06-08 DIAGNOSIS — D120 Benign neoplasm of cecum: Secondary | ICD-10-CM | POA: Diagnosis not present

## 2024-06-08 DIAGNOSIS — E119 Type 2 diabetes mellitus without complications: Secondary | ICD-10-CM | POA: Diagnosis not present

## 2024-06-08 DIAGNOSIS — F32A Depression, unspecified: Secondary | ICD-10-CM | POA: Diagnosis not present

## 2024-06-08 DIAGNOSIS — K635 Polyp of colon: Secondary | ICD-10-CM | POA: Diagnosis not present

## 2024-06-08 DIAGNOSIS — F419 Anxiety disorder, unspecified: Secondary | ICD-10-CM | POA: Diagnosis not present

## 2024-06-08 MED ORDER — SODIUM CHLORIDE 0.9 % IV SOLN: 500.0000 mL | INTRAVENOUS | Status: DC

## 2024-06-08 NOTE — Progress Notes (Signed)
 Expand All Collapse All HISTORY OF PRESENT ILLNESS:   Shelby Vazquez is a 73 y.o. female with multiple medical problems as listed below who presents to this clinic to establish GI care.  Multiple prior gastroenterologist elsewhere including [facility] and [facility].  Had seen Ellouise Console, PA-C in that office.  GI issues include IBS and GERD as well as a history of colon polyps.   The patient has a myriad of complaints today.  She describes bowel habits that have alternated between diarrhea and most recently constipation.  She is quite detailed about the history of her bowel habits as well as urologic issues and treatment that have occurred.  She is a bit of a rambling historian.  She did have a history of Takotsubo cardiomyopathy for which she has recovered with most recent ejection fraction 60 to 65%.  For chronic GERD she takes Aciphex .  As mentioned, she does have a history of adenomatous polyps.  Last colonoscopy with Dr. Donnald 12/18/2017.  5-year follow-up recommended.  She was also noted to have sigmoid diverticulosis and a normal terminal ileum.  In terms of her bowels, she has been using 2 Colace at night.  Previously, MiraLAX  sporadically.       REVIEW OF SYSTEMS:   All non-GI ROS negative unless otherwise stated in the HPI except for anxiety, hearing problems       Past Medical History:  Diagnosis Date   Abdominal aortic atherosclerosis (HCC) 10/2014    by CT scan   Anemia     Anxiety     B12 deficiency     Chronic anxiety     Chronic cough      cyclical cough syndrome   Complication of anesthesia 9 yrs ago    awake and in severe pain with colonoscopy   Concussion     Depression     Diet-controlled diabetes mellitus (HCC) 02/22/2017    New dx 02/2017   Diverticulosis     GERD (gastroesophageal reflux disease)      severe leading to chronic cough and hoarseness   History of Epstein-Barr virus infection 2012   Hyperlipidemia     Hypertension     IBS (irritable bowel  syndrome)      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 , PB-8 probiotic, healthy diet and exercise   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    By CT 09/2017 & 09/2018 - max diameter 4cm f/u annually    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    had surgery with 5 days of chemotherapy   Osteopenia 01/2015    T -0.8 spine, -1.7 hip   Pyelonephritis due to Escherichia coli 07/16/2017   Trophoblastic disease     Uterine prolapse     Vocal cord paralysis, bilateral partial 2001    evaluated at [facility] - did not require surgery               Past Surgical History:  Procedure Laterality Date   BUNIONECTOMY        Regal '2000   CARDIAC CATHETERIZATION       CATARACT EXTRACTION        right 01/2010, left may 2011   COLONOSCOPY   03/2018    diverticulosis, rpt 5 yrs ([name])   COLONOSCOPY WITH PROPOFOL  N/A 03/31/2014    sessile serrated adenomas, sigmoid diverticulosis, rpt 3 yrs Beverlie Donnald GAILS, MD)   CORONARY CALCIUM  SCORE-CORONARY CTA   09/2017    Calcium  score 53 . Normal  right dominant coronary artery system with mild onset of CAD. Aneurysmal dilation of the ascending aorta with maximum diameter diameter of 4.0-3.8 centimeter. -Recommend annual CT/MRA December 2018   DILATION AND CURETTAGE OF UTERUS   '77    for molar pregnancy excision   ESOPHAGOGASTRODUODENOSCOPY N/A 03/31/2014    mult gastric polyps, mild mucosal hemorrhages; ESOPHAGOGASTRODUODENOSCOPY (EGD);  Surgeon: Lamar Donnald GAILS, MD   ESOPHAGOGASTRODUODENOSCOPY ENDOSCOPY   01/2013   INTERSTIM IMPLANT PLACEMENT   2018   LEFT HEART CATH AND CORONARY ANGIOGRAPHY N/A 03/06/2023    Procedure: LEFT HEART CATH AND CORONARY ANGIOGRAPHY;  Surgeon: Burnard Debby LABOR, MD;  Location: [facility];  Service: Cardiovascular;  Laterality: N/A;   toe surgery Right 09/2018   TRANSTHORACIC ECHOCARDIOGRAM   09/2017    Normal LV size and function. EF 55-60%. GR 1 DD. No regional wall motion abnormalities. No significant  valvular abnormalities.          Social History SULLY MANZI  reports that she has never smoked. She has never used smokeless tobacco. She reports that she does not currently use alcohol. She reports that she does not use drugs.   family history includes Breast cancer in her maternal aunt; Esophageal cancer in her father; Heart attack in her maternal grandfather and maternal grandmother; Heart attack (age of onset: 68) in her mother; Heart disease in her paternal grandfather and paternal grandmother; Heart disease (age of onset: 20) in her mother; Hyperlipidemia in her brother and mother; Hypertension in her brother and mother; Stomach cancer in her father.   Allergies       Allergies  Allergen Reactions   Ciprofloxacin  Nausea And Vomiting   Sulfamethoxazole-Trimethoprim Nausea And Vomiting   Augmentin  [Amoxicillin -Pot Clavulanate] Other (See Comments)      Severe diarrhea, rash, worsened acid reflux   Erythromycin Rash            PHYSICAL EXAMINATION: Vital signs: BP 120/80 (BP Location: Left Arm, Patient Position: Sitting, Cuff Size: Normal)   Pulse 72   Ht 5' 3.5 (1.613 m) Comment: height measured without shoes  Wt 140 lb 6 oz (63.7 kg)   BMI 24.48 kg/m   Constitutional: generally well-appearing, no acute distress Psychiatric: alert and oriented x3, cooperative Eyes: extraocular movements intact, anicteric, conjunctiva pink Mouth: oral pharynx moist, no lesions Neck: supple no lymphadenopathy Cardiovascular: heart regular rate and rhythm, no murmur Lungs: clear to auscultation bilaterally Abdomen: soft, nontender, nondistended, no obvious ascites, no peritoneal signs, normal bowel sounds, no organomegaly Rectal: Deferred until colonoscopy Extremities: no clubbing, cyanosis, or lower extremity edema bilaterally Skin: no lesions on visible extremities Neuro: No focal deficits.  Cranial nerves intact   ASSESSMENT:   1.  Irritable bowel syndrome.  Currently with  constipation predominance 2.  GERD.  On Aciphex  and famotidine  3.  History of adenomatous colon polyps.  Last colonoscopy 2019, elsewhere.  Due for surveillance 4.  General Medical problems.  Stable 5.  Takotsubo cardiomyopathy--resolved     PLAN:   1.  Continue Colace 2 daily 2.  Add MiraLAX  1 capful (17 g) daily for 1 week.  Thereafter, titrate to desired effect 3.  Reflux precautions 4.  Continue Aciphex  and famotidine  5.  Schedule surveillance colonoscopy.The nature of the procedure, as well as the risks, benefits, and alternatives were carefully and thoroughly reviewed with the patient. Ample time for discussion and questions allowed. The patient understood, was satisfied, and agreed to proceed. 6.  Ongoing continuity of care with Ellouise  Honora, PA-C post colonoscopy  Recent H&P as above.  Now for surveillance colonoscopy.

## 2024-06-08 NOTE — Progress Notes (Signed)
 Called to room to assist during endoscopic procedure.  Patient ID and intended procedure confirmed with present staff. Received instructions for my participation in the procedure from the performing physician.

## 2024-06-08 NOTE — Op Note (Signed)
 [facility] Patient Name: Shelby Vazquez Procedure Date: 06/08/2024 10:46 AM MRN: [id number] Endoscopist: Norleen SAILOR. Abran , MD, [id number] Age: 73 Referring MD:  Date of Birth: 11-04-1950 Gender: Female Account #: [id number] Procedure:                Colonoscopy with cold snare polypectomy x 2 Indications:              High risk colon cancer surveillance: Personal                            history of non-advanced adenoma (2019 Dr. Donnald) Medicines:                Monitored Anesthesia Care Procedure:                Pre-Anesthesia Assessment:                           - Prior to the procedure, a History and Physical                            was performed, and patient medications and                            allergies were reviewed. The patient's tolerance of                            previous anesthesia was also reviewed. The risks                            and benefits of the procedure and the sedation                            options and risks were discussed with the patient.                            All questions were answered, and informed consent                            was obtained. Prior Anticoagulants: The patient has                            taken no anticoagulant or antiplatelet agents. ASA                            Grade Assessment: II - A patient with mild systemic                            disease. After reviewing the risks and benefits,                            the patient was deemed in satisfactory condition to                            undergo the procedure.  After obtaining informed consent, the colonoscope                            was passed under direct vision. Throughout the                            procedure, the patient's blood pressure, pulse, and                            oxygen saturations were monitored continuously. The                            Olympus CF-HQ190L ([id number]) Colonoscope was                             introduced through the anus and advanced to the the                            cecum, identified by appendiceal orifice and                            ileocecal valve. The ileocecal valve, appendiceal                            orifice, and rectum were photographed. The quality                            of the bowel preparation was excellent. The                            colonoscopy was performed without difficulty. The                            patient tolerated the procedure well. The bowel                            preparation used was SUPREP via split dose                            instruction. Scope In: 10:59:04 AM Scope Out: 11:15:53 AM Scope Withdrawal Time: 0 hours 12 minutes 53 seconds  Total Procedure Duration: 0 hours 16 minutes 49 seconds  Findings:                 Two polyps were found in the sigmoid colon and                            cecum. The polyps were 4 to 6 mm in size. These                            polyps were removed with a cold snare. Resection                            and retrieval were complete.  Multiple diverticula were found in the left colon.                           Internal hemorrhoids were found during retroflexion.                           The exam was otherwise without abnormality on                            direct and retroflexion views. Complications:            No immediate complications. Estimated blood loss:                            None. Estimated Blood Loss:     Estimated blood loss: none. Impression:               - Two 4 to 6 mm polyps in the sigmoid colon and in                            the cecum, removed with a cold snare. Resected and                            retrieved.                           - Diverticulosis in the left colon.                           - Internal hemorrhoids.                           - The examination was otherwise normal on direct                             and retroflexion views. Recommendation:           - Repeat colonoscopy in 5 years for surveillance.                           - Patient has a contact number available for                            emergencies. The signs and symptoms of potential                            delayed complications were discussed with the                            patient. Return to normal activities tomorrow.                            Written discharge instructions were provided to the                            patient.                           -  Resume previous diet.                           - Continue present medications.                           - Await pathology results. Norleen SAILOR. Abran, MD 06/08/2024 11:27:13 AM This report has been signed electronically.

## 2024-06-08 NOTE — Patient Instructions (Signed)
 Repeat colonoscopy in 5 years for surveillance.  Resume previous diet.  Continued present medications. Awaiting pathology results. Handouts provided on polyps, diverticulosis, and hemorrhoids.   YOU HAD AN ENDOSCOPIC PROCEDURE TODAY AT THE [facility]:   Refer to the procedure report that was given to you for any specific questions about what was found during the examination.  If the procedure report does not answer your questions, please call your gastroenterologist to clarify.  If you requested that your care partner not be given the details of your procedure findings, then the procedure report has been included in a sealed envelope for you to review at your convenience later.  YOU SHOULD EXPECT: Some feelings of bloating in the abdomen. Passage of more gas than usual.  Walking can help get rid of the air that was put into your GI tract during the procedure and reduce the bloating. If you had a lower endoscopy (such as a colonoscopy or flexible sigmoidoscopy) you may notice spotting of blood in your stool or on the toilet paper. If you underwent a bowel prep for your procedure, you may not have a normal bowel movement for a few days.  Please Note:  You might notice some irritation and congestion in your nose or some drainage.  This is from the oxygen used during your procedure.  There is no need for concern and it should clear up in a day or so.  SYMPTOMS TO REPORT IMMEDIATELY:  Following lower endoscopy (colonoscopy or flexible sigmoidoscopy):  Excessive amounts of blood in the stool  Significant tenderness or worsening of abdominal pains  Swelling of the abdomen that is new, acute  Fever of 100F or higher  For urgent or emergent issues, a gastroenterologist can be reached at any hour by calling [phone]. Do not use MyChart messaging for urgent concerns.    DIET:  We do recommend a small meal at first, but then you may proceed to your regular diet.  Drink plenty of  fluids but you should avoid alcoholic beverages for 24 hours.  ACTIVITY:  You should plan to take it easy for the rest of today and you should NOT DRIVE or use heavy machinery until tomorrow (because of the sedation medicines used during the test).    FOLLOW UP: Our staff will call the number listed on your records the next business day following your procedure.  We will call around 7:15- 8:00 am to check on you and address any questions or concerns that you may have regarding the information given to you following your procedure. If we do not reach you, we will leave a message.     If any biopsies were taken you will be contacted by phone or by letter within the next 1-3 weeks.  Please call us  at [phone] if you have not heard about the biopsies in 3 weeks.    SIGNATURES/CONFIDENTIALITY: You and/or your care partner have signed paperwork which will be entered into your electronic medical record.  These signatures attest to the fact that that the information above on your After Visit Summary has been reviewed and is understood.  Full responsibility of the confidentiality of this discharge information lies with you and/or your care-partner.

## 2024-06-09 ENCOUNTER — Telehealth: Payer: Self-pay | Admitting: *Deleted

## 2024-06-09 NOTE — Telephone Encounter (Signed)
  Follow up Call-     06/08/2024   10:03 AM  Call back number  Post procedure Call Back phone  # [phone]  Permission to leave phone message Yes     Patient questions:   Message left to call us  if necessary.

## 2024-06-10 ENCOUNTER — Ambulatory Visit: Payer: Self-pay | Admitting: Internal Medicine

## 2024-06-10 LAB — SURGICAL PATHOLOGY

## 2024-06-18 ENCOUNTER — Other Ambulatory Visit (HOSPITAL_BASED_OUTPATIENT_CLINIC_OR_DEPARTMENT_OTHER): Payer: Self-pay

## 2024-06-18 ENCOUNTER — Other Ambulatory Visit (HOSPITAL_COMMUNITY): Payer: Self-pay

## 2024-07-04 ENCOUNTER — Other Ambulatory Visit: Payer: Self-pay | Admitting: Family Medicine

## 2024-07-04 DIAGNOSIS — M85851 Other specified disorders of bone density and structure, right thigh: Secondary | ICD-10-CM

## 2024-07-04 DIAGNOSIS — E611 Iron deficiency: Secondary | ICD-10-CM

## 2024-07-04 DIAGNOSIS — E119 Type 2 diabetes mellitus without complications: Secondary | ICD-10-CM

## 2024-07-04 DIAGNOSIS — E1169 Type 2 diabetes mellitus with other specified complication: Secondary | ICD-10-CM

## 2024-07-04 DIAGNOSIS — E538 Deficiency of other specified B group vitamins: Secondary | ICD-10-CM

## 2024-07-06 ENCOUNTER — Other Ambulatory Visit (INDEPENDENT_AMBULATORY_CARE_PROVIDER_SITE_OTHER)

## 2024-07-06 DIAGNOSIS — E1169 Type 2 diabetes mellitus with other specified complication: Secondary | ICD-10-CM | POA: Diagnosis not present

## 2024-07-06 DIAGNOSIS — M85851 Other specified disorders of bone density and structure, right thigh: Secondary | ICD-10-CM | POA: Diagnosis not present

## 2024-07-06 DIAGNOSIS — E538 Deficiency of other specified B group vitamins: Secondary | ICD-10-CM

## 2024-07-06 DIAGNOSIS — E785 Hyperlipidemia, unspecified: Secondary | ICD-10-CM

## 2024-07-06 DIAGNOSIS — E611 Iron deficiency: Secondary | ICD-10-CM

## 2024-07-06 DIAGNOSIS — E119 Type 2 diabetes mellitus without complications: Secondary | ICD-10-CM

## 2024-07-06 LAB — CBC WITH DIFFERENTIAL/PLATELET
Basophils Absolute: 0 K/uL (ref 0.0–0.1)
Basophils Relative: 0.5 % (ref 0.0–3.0)
Eosinophils Absolute: 0.1 K/uL (ref 0.0–0.7)
Eosinophils Relative: 1.9 % (ref 0.0–5.0)
HCT: 38.6 % (ref 36.0–46.0)
Hemoglobin: 12.9 g/dL (ref 12.0–15.0)
Lymphocytes Relative: 37.6 % (ref 12.0–46.0)
Lymphs Abs: 2.3 K/uL (ref 0.7–4.0)
MCHC: 33.5 g/dL (ref 30.0–36.0)
MCV: 93.2 fl (ref 78.0–100.0)
Monocytes Absolute: 0.3 K/uL (ref 0.1–1.0)
Monocytes Relative: 5.5 % (ref 3.0–12.0)
Neutro Abs: 3.3 K/uL (ref 1.4–7.7)
Neutrophils Relative %: 54.5 % (ref 43.0–77.0)
Platelets: 285 K/uL (ref 150.0–400.0)
RBC: 4.15 Mil/uL (ref 3.87–5.11)
RDW: 13.3 % (ref 11.5–15.5)
WBC: 6.1 K/uL (ref 4.0–10.5)

## 2024-07-06 LAB — LIPID PANEL
Cholesterol: 120 mg/dL (ref 0–200)
HDL: 47.5 mg/dL (ref >39.00)
LDL Cholesterol: 58 mg/dL (ref 0–99)
NonHDL: 72.93
Total CHOL/HDL Ratio: 3
Triglycerides: 77 mg/dL (ref 0.0–149.0)
VLDL: 15.4 mg/dL (ref 0.0–40.0)

## 2024-07-06 LAB — COMPREHENSIVE METABOLIC PANEL WITH GFR
ALT: 13 U/L (ref 0–35)
AST: 20 U/L (ref 0–37)
Albumin: 4.5 g/dL (ref 3.5–5.2)
Alkaline Phosphatase: 31 U/L — ABNORMAL LOW (ref 39–117)
BUN: 10 mg/dL (ref 6–23)
CO2: 28 meq/L (ref 19–32)
Calcium: 9.8 mg/dL (ref 8.4–10.5)
Chloride: 103 meq/L (ref 96–112)
Creatinine, Ser: 0.79 mg/dL (ref 0.40–1.20)
GFR: 74.17 mL/min (ref >60.00)
Glucose, Bld: 111 mg/dL — ABNORMAL HIGH (ref 70–99)
Potassium: 4.1 meq/L (ref 3.5–5.1)
Sodium: 140 meq/L (ref 135–145)
Total Bilirubin: 0.6 mg/dL (ref 0.2–1.2)
Total Protein: 6.4 g/dL (ref 6.0–8.3)

## 2024-07-06 LAB — IBC PANEL
Iron: 125 ug/dL (ref 42–145)
Saturation Ratios: 34.5 % (ref 20.0–50.0)
TIBC: 362.6 ug/dL (ref 250.0–450.0)
Transferrin: 259 mg/dL (ref 212.0–360.0)

## 2024-07-06 LAB — HEMOGLOBIN A1C: Hgb A1c MFr Bld: 6.6 % — ABNORMAL HIGH (ref 4.6–6.5)

## 2024-07-06 LAB — FERRITIN: Ferritin: 53.2 ng/mL (ref 10.0–291.0)

## 2024-07-06 LAB — VITAMIN D 25 HYDROXY (VIT D DEFICIENCY, FRACTURES): VITD: 79.29 ng/mL (ref 30.00–100.00)

## 2024-07-06 LAB — VITAMIN B12: Vitamin B-12: 300 pg/mL (ref 211–911)

## 2024-07-06 LAB — MICROALBUMIN / CREATININE URINE RATIO
Creatinine,U: 67.9 mg/dL
Microalb Creat Ratio: UNDETERMINED mg/g (ref 0.0–30.0)
Microalb, Ur: <0.7 mg/dL

## 2024-07-08 ENCOUNTER — Ambulatory Visit: Payer: Self-pay | Admitting: Family Medicine

## 2024-07-13 ENCOUNTER — Encounter: Payer: Self-pay | Admitting: Family Medicine

## 2024-07-13 ENCOUNTER — Ambulatory Visit: Admitting: Family Medicine

## 2024-07-13 VITALS — BP 138/82 | HR 75 | Temp 97.9°F | Ht 63.5 in | Wt 140.2 lb

## 2024-07-13 DIAGNOSIS — E1169 Type 2 diabetes mellitus with other specified complication: Secondary | ICD-10-CM

## 2024-07-13 DIAGNOSIS — Z23 Encounter for immunization: Secondary | ICD-10-CM | POA: Diagnosis not present

## 2024-07-13 DIAGNOSIS — F331 Major depressive disorder, recurrent, moderate: Secondary | ICD-10-CM | POA: Diagnosis not present

## 2024-07-13 DIAGNOSIS — H9193 Unspecified hearing loss, bilateral: Secondary | ICD-10-CM

## 2024-07-13 DIAGNOSIS — K582 Mixed irritable bowel syndrome: Secondary | ICD-10-CM

## 2024-07-13 DIAGNOSIS — Z7189 Other specified counseling: Secondary | ICD-10-CM

## 2024-07-13 DIAGNOSIS — N3281 Overactive bladder: Secondary | ICD-10-CM

## 2024-07-13 DIAGNOSIS — Z0000 Encounter for general adult medical examination without abnormal findings: Secondary | ICD-10-CM

## 2024-07-13 DIAGNOSIS — E119 Type 2 diabetes mellitus without complications: Secondary | ICD-10-CM

## 2024-07-13 DIAGNOSIS — N390 Urinary tract infection, site not specified: Secondary | ICD-10-CM

## 2024-07-13 DIAGNOSIS — E611 Iron deficiency: Secondary | ICD-10-CM | POA: Diagnosis not present

## 2024-07-13 DIAGNOSIS — K219 Gastro-esophageal reflux disease without esophagitis: Secondary | ICD-10-CM

## 2024-07-13 DIAGNOSIS — I77810 Thoracic aortic ectasia: Secondary | ICD-10-CM

## 2024-07-13 DIAGNOSIS — F411 Generalized anxiety disorder: Secondary | ICD-10-CM | POA: Diagnosis not present

## 2024-07-13 DIAGNOSIS — I10 Essential (primary) hypertension: Secondary | ICD-10-CM

## 2024-07-13 DIAGNOSIS — M85851 Other specified disorders of bone density and structure, right thigh: Secondary | ICD-10-CM

## 2024-07-13 DIAGNOSIS — E538 Deficiency of other specified B group vitamins: Secondary | ICD-10-CM | POA: Diagnosis not present

## 2024-07-13 DIAGNOSIS — I2510 Atherosclerotic heart disease of native coronary artery without angina pectoris: Secondary | ICD-10-CM

## 2024-07-13 DIAGNOSIS — E785 Hyperlipidemia, unspecified: Secondary | ICD-10-CM | POA: Diagnosis not present

## 2024-07-13 DIAGNOSIS — Z960 Presence of urogenital implants: Secondary | ICD-10-CM

## 2024-07-13 MED ORDER — POLYETHYLENE GLYCOL 3350 17 GM/SCOOP PO POWD: 17.0000 g | Freq: Every day | ORAL | Status: AC | PRN

## 2024-07-13 NOTE — Patient Instructions (Addendum)
 Flu shot today Prevnar-20 today We will requests records of mammogram.  Bring in copy of living will to update your chart.  Watch added sugars/carbs in diet to keep sugar levels controlled Let me know if interest in physical therapy referral for balance training.  Good to see you today Return as needed or in 6 months for follow up visit

## 2024-07-13 NOTE — Progress Notes (Unsigned)
 Ph: [phone] Fax: [phone]   Patient ID: Shelby Vazquez, female    DOB: [birth year], 73 y.o.   MRN: [id number]  This visit was conducted in person.  BP 138/82   Pulse 75   Temp 97.9 F (36.6 C) (Oral)   Ht 5' 3.5 (1.613 m)   Wt 140 lb 4 oz (63.6 kg)   SpO2 99%   BMI 24.45 kg/m    CC: CPE/AMW Subjective:   HPI: Shelby Vazquez is a 73 y.o. female presenting on 07/13/2024 for Annual Exam   Did not see health advisor this year.   No results found.  Flowsheet Row Office Visit from 12/22/2023 in [facility] [place]  PHQ-2 Total Score 1       07/11/2023    8:32 AM 06/25/2023   10:33 AM 06/20/2023   10:00 AM 06/18/2023   10:05 AM 06/16/2023   10:17 AM  Fall Risk   Falls in the past year? 0 0 0 0 0  Number falls in past yr: 0 0 0 0 0  Injury with Fall? 0 0 0 0 0  Risk for fall due to : No Fall Risks No Fall Risks No Fall Risks No Fall Risks No Fall Risks  Follow up Falls evaluation completed Falls evaluation completed Falls evaluation completed Falls evaluation completed Falls evaluation completed   Trip to [place] this Spring for 3 weeks - had a great time!   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   IBS, sees GI - doing well on Librax  1 tab BID, stopped dicyclomine 20mg  due to lack of effect. Continues benefiber PRN, IBGard PRN, miralax  PRN. Lactose free diet. Predominantly managing with health diet. Ongoing stool urgency attributed to high fiber.    OAB, urinary retention, recurrent UTIs followed by urology Angelique). On Hiprex  (methenamine ) daily for UTI ppx. Also using InterStim.    GERD - on aciphex  20mg  daily and pepcid  40mg  qhs.    MDD - now off antidepressants - effexor , cymbalta  worsened diarrhea. She has declined psychiatry. She had troubl getting in with counselor.    Preventative: COLONOSCOPY 03/2018 - diverticulosis, rpt 5 yrs ([name])  Colonoscopy  05/2024 - TA, SSP, diverticulosis, rpt 5 yrs Oletta)  ESOPHAGOGASTRODUODENOSCOPY Date: 03/31/2014 mult gastric polyps, mild mucosal hemorrhages but no microscopic colitis ([name])  Well woman with Dr. 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 Packet provided previously - encouraged to continue working on this. HCPOA would be daughter Shelby Vazquez.  Seat belt use discussed  Sunscreen use discussed, no changing moles on skin  Non smoker Alcohol - none  Dentist - sees regularly Eye exam - last seen 10/2023  Bowel - chronic IBS symptoms Bladder - occasional incontinence    Married 1973. 2 daughters '74, '80; 5 grandchildren.   No h/o physical or sexual abuse. 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  Edu: [employer]  Activity: enjoys hiking Diet: good water, fruits/vegetables daily     Relevant past medical, surgical, family and social history reviewed and updated as indicated. Interim medical history since our last visit reviewed. Allergies and medications reviewed and updated. Outpatient Medications Prior to Visit  Medication Sig Dispense Refill   acetaminophen  (TYLENOL ) 500 MG tablet Take 500 mg by mouth every 6 (six) hours as needed.     aspirin  81 MG tablet Take 1 tablet (81 mg total) by mouth daily. 30 tablet    Blood Glucose Monitoring Suppl (BLOOD GLUCOSE MONITOR SYSTEM) w/Device KIT Use to test blood sugar 3  times daily 1 kit 0   Calcium  Carbonate-Vitamin D  600-200 MG-UNIT CAPS Take 1 capsule by mouth daily.     clidinium-chlordiazePOXIDE  (LIBRAX ) 5-2.5 MG capsule Take 1 capsule by mouth 2 (two) times daily at 8 am and 10 pm. 180 capsule 3   estradiol  (ESTRACE ) 0.1 MG/GM vaginal cream Apply a pea sized amount to the tip of a clean finger and apply around urethra once daily for a week and then every 72 hours after. 42.5 g 3   Estradiol -Estriol-Progesterone (BIEST/PROGESTERONE TD) Place 2 mLs onto the skin in the morning and at bedtime. Apply to inner thighs or arms     famotidine  (PEPCID ) 40 MG tablet Take 1 tablet (40 mg total) by mouth daily at bedtime. 90 tablet 3   loperamide  (IMODIUM  A-D) 2 MG tablet Take 1 tablet (2 mg total) by mouth 2 (two) times daily as needed for diarrhea or loose stools.     loratadine  (CLARITIN ) 10 MG tablet Take 10 mg by mouth daily.     Meth-Hyo-M Bl-Na Phos-Ph Sal (URO-SP) 118 MG CAPS Take 1 capsule by mouth every 6 (six) hours as needed (dysuria / bladder pain). 120 capsule 99   methenamine  (HIPREX ) 1 g tablet Take 1 tablet (1 g total) by mouth 2 (two) times daily. 180 tablet 3   metoprolol  succinate (TOPROL -XL) 100 MG 24 hr tablet Take 1 tablet (100 mg total) by mouth daily. Take with or immediately following a meal. 90 tablet 1   ondansetron  (ZOFRAN -ODT) 4 MG disintegrating tablet DISSOLVE 1 TABLET(4 MG) ON THE TONGUE EVERY 8 HOURS AS NEEDED FOR NAUSEA OR VOMITING 30 tablet 00   Probiotic Product (PROBIOTIC PO) Take by mouth daily. 900 billion     RABEprazole  (ACIPHEX ) 20 MG tablet Take 1 tablet (20 mg total) by mouth daily. 90 tablet 3   rosuvastatin  (CRESTOR ) 20 MG tablet Take 1 tablet (20 mg total) by mouth every evening. 90 tablet 1   sacubitril -valsartan  (ENTRESTO ) 24-26 MG Take 1 tablet by mouth 2 (two) times daily. 180 tablet 1   simethicone (MYLICON) 125 MG chewable tablet Chew 125 mg by mouth once a week.     spironolactone  (ALDACTONE ) 25 MG tablet Take 0.5  tablets (12.5 mg total) by mouth daily. 45 tablet 1   Vitamin B Complex-C CAPS Take 1 capsule by mouth daily.     Wheat Dextrin (BENEFIBER DRINK MIX PO) Take by mouth.     docusate sodium (COLACE) 100 MG capsule Take 200 mg by mouth daily. (Patient not taking: Reported on 07/13/2024)     No facility-administered medications prior to visit.     Per HPI unless specifically indicated in ROS section below Review of Systems  Constitutional:  Positive for appetite change (occ decreased). Negative for activity change, chills, fatigue, fever and unexpected weight change.  HENT:  Negative for hearing  loss.   Eyes:  Negative for visual disturbance.  Respiratory:  Negative for cough, chest tightness, shortness of breath and wheezing.   Cardiovascular:  Negative for chest pain, palpitations and leg swelling.  Gastrointestinal:  Positive for abdominal pain, constipation and diarrhea. Negative for abdominal distention, blood in stool, nausea and vomiting.       IBS related symptoms  Genitourinary:  Negative for difficulty urinating and hematuria.       Has interstim for bladder   Musculoskeletal:  Positive for back pain. Negative for arthralgias, myalgias and neck pain.  Skin:  Negative for rash.  Neurological:  Negative for dizziness, seizures, syncope and headaches.  Hematological:  Negative for adenopathy. Does not bruise/bleed easily.  Psychiatric/Behavioral:  Negative for dysphoric mood. The patient is nervous/anxious.     Objective:  BP 138/82   Pulse 75   Temp 97.9 F (36.6 C) (Oral)   Ht 5' 3.5 (1.613 m)   Wt 140 lb 4 oz (63.6 kg)   SpO2 99%   BMI 24.45 kg/m   Wt Readings from Last 3 Encounters:  07/13/24 140 lb 4 oz (63.6 kg)  06/08/24 140 lb (63.5 kg)  04/16/24 140 lb 6 oz (63.7 kg)      Physical Exam Vitals and nursing note reviewed.  Constitutional:      Appearance: Normal appearance. She is not ill-appearing.  HENT:     Head: Normocephalic and atraumatic.     Right Ear:  Tympanic membrane, ear canal and external ear normal. There is no impacted cerumen.     Left Ear: Tympanic membrane, ear canal and external ear normal. There is no impacted cerumen.     Mouth/Throat:     Mouth: Mucous membranes are moist.     Pharynx: Oropharynx is clear. No oropharyngeal exudate or posterior oropharyngeal erythema.  Eyes:     General:        Right eye: No discharge.        Left eye: No discharge.     Extraocular Movements: Extraocular movements intact.     Conjunctiva/sclera: Conjunctivae normal.     Pupils: Pupils are equal, round, and reactive to light.  Neck:     Thyroid : No thyroid  mass or thyromegaly.  Cardiovascular:     Rate and Rhythm: Normal rate and regular rhythm.     Pulses: Normal pulses.     Heart sounds: Normal heart sounds. No murmur heard. Pulmonary:     Effort: Pulmonary effort is normal. No respiratory distress.     Breath sounds: Normal breath sounds. No wheezing, rhonchi or rales.  Abdominal:     General: Bowel sounds are normal. There is no distension.     Palpations: Abdomen is soft. There is no mass.     Tenderness: There is no abdominal tenderness. There is no guarding or rebound.     Hernia: No hernia is present.  Musculoskeletal:     Cervical back: Normal range of motion and neck supple. No rigidity.     Right lower leg: No edema.     Left lower leg: No edema.  Lymphadenopathy:     Cervical: No cervical adenopathy.  Skin:    General: Skin is warm and dry.     Findings: No rash.  Neurological:     General: No focal deficit present.     Mental Status: She is alert. Mental status is at baseline.     Comments:  Recall 3/3  Calculation 5/5 DLROW  Psychiatric:  Mood and Affect: Mood normal.        Behavior: Behavior normal.       Results for orders placed or performed in visit on 07/06/24  CBC with Differential/Platelet   Collection Time: 07/06/24  8:47 AM  Result Value Ref Range   WBC 6.1 4.0 - 10.5 K/uL   RBC 4.15 3.87  - 5.11 Mil/uL   Hemoglobin 12.9 12.0 - 15.0 g/dL   HCT 61.3 63.9 - 53.9 %   MCV 93.2 78.0 - 100.0 fl   MCHC 33.5 30.0 - 36.0 g/dL   RDW 86.6 88.4 - 84.4 %   Platelets 285.0 150.0 - 400.0 K/uL   Neutrophils Relative % 54.5 43.0 - 77.0 %   Lymphocytes Relative 37.6 12.0 - 46.0 %   Monocytes Relative 5.5 3.0 - 12.0 %   Eosinophils Relative 1.9 0.0 - 5.0 %   Basophils Relative 0.5 0.0 - 3.0 %   Neutro Abs 3.3 1.4 - 7.7 K/uL   Lymphs Abs 2.3 0.7 - 4.0 K/uL   Monocytes Absolute 0.3 0.1 - 1.0 K/uL   Eosinophils Absolute 0.1 0.0 - 0.7 K/uL   Basophils Absolute 0.0 0.0 - 0.1 K/uL  VITAMIN D  25 Hydroxy (Vit-D Deficiency, Fractures)   Collection Time: 07/06/24  8:47 AM  Result Value Ref Range   VITD 79.29 30.00 - 100.00 ng/mL  Vitamin B12   Collection Time: 07/06/24  8:47 AM  Result Value Ref Range   Vitamin B-12 300 211 - 911 pg/mL  Ferritin   Collection Time: 07/06/24  8:47 AM  Result Value Ref Range   Ferritin 53.2 10.0 - 291.0 ng/mL  IBC panel   Collection Time: 07/06/24  8:47 AM  Result Value Ref Range   Iron 125 42 - 145 ug/dL   Transferrin 740.9 787.9 - 360.0 mg/dL   Saturation Ratios 65.4 20.0 - 50.0 %   TIBC 362.6 250.0 - 450.0 mcg/dL  Comprehensive metabolic panel with GFR   Collection Time: 07/06/24  8:47 AM  Result Value Ref Range   Sodium 140 135 - 145 mEq/L   Potassium 4.1 3.5 - 5.1 mEq/L   Chloride 103 96 - 112 mEq/L   CO2 28 19 - 32 mEq/L   Glucose, Bld 111 (H) 70 - 99 mg/dL   BUN 10 6 - 23 mg/dL   Creatinine, Ser 9.20 0.40 - 1.20 mg/dL   Total Bilirubin 0.6 0.2 - 1.2 mg/dL   Alkaline Phosphatase 31 (L) 39 - 117 U/L   AST 20 0 - 37 U/L   ALT 13 0 - 35 U/L   Total Protein 6.4 6.0 - 8.3 g/dL   Albumin 4.5 3.5 - 5.2 g/dL   GFR 25.82 >39.99 mL/min   Calcium  9.8 8.4 - 10.5 mg/dL  Lipid panel   Collection Time: 07/06/24  8:47 AM  Result Value Ref Range   Cholesterol 120 0 - 200 mg/dL   Triglycerides 22.9 0.0 - 149.0 mg/dL   HDL 52.49 >60.99 mg/dL   VLDL 84.5  0.0 - 59.9 mg/dL   LDL Cholesterol 58 0 - 99 mg/dL   Total CHOL/HDL Ratio 3    NonHDL 72.93   Microalbumin / creatinine urine ratio   Collection Time: 07/06/24  8:47 AM  Result Value Ref Range   Microalb, Ur <0.7 mg/dL   Creatinine,U 32.0 mg/dL   Microalb Creat Ratio Unable to calculate 0.0 - 30.0 mg/g  Hemoglobin A1c   Collection Time: 07/06/24  8:47 AM  Result Value Ref Range  Hgb A1c MFr Bld 6.6 (H) 4.6 - 6.5 %      12/22/2023    8:14 AM 07/11/2023    8:32 AM 06/25/2023   11:22 AM 06/03/2023    2:36 PM 04/03/2023   11:08 AM  Depression screen PHQ 2/9  Decreased Interest 0 0 0 0 0  Down, Depressed, Hopeless 1 0 0 0 3  PHQ - 2 Score 1 0 0 0 3  Altered sleeping 1 0 0 0 1  Tired, decreased energy 0 0 0 0 3  Change in appetite 0 0 0 0 1  Feeling bad or failure about yourself  0 0 0 0 3  Trouble concentrating 1 0 0 1 3  Moving slowly or fidgety/restless 0 0 0 0 1  Suicidal thoughts 0 0 0 0 0  PHQ-9 Score 3 0 0 1 15  Difficult doing work/chores Not difficult at all Not difficult at all Not difficult at all Somewhat difficult Somewhat difficult       12/22/2023    8:15 AM 07/11/2023    8:32 AM 11/08/2022    9:13 AM 02/20/2022   12:19 PM  GAD 7 : Generalized Anxiety Score  Nervous, Anxious, on Edge 2 0 2 3  Control/stop worrying 1 0 2 3  Worry too much - different things 2 0 2 3  Trouble relaxing 2 0 2 3  Restless 0 0 1 2  Easily annoyed or irritable 1 0 2 3  Afraid - awful might happen 1 0 2 3  Total GAD 7 Score 9 0 13 20  Anxiety Difficulty Not difficult at all Not difficult at all Somewhat difficult Very difficult   Assessment & Plan:   Problem List Items Addressed This Visit     Medicare annual wellness visit, subsequent - Primary (Chronic)   I have personally reviewed the Medicare Annual Wellness questionnaire and have noted 1. The patient's medical and social history 2. Their use of alcohol, tobacco or illicit drugs 3. Their current medications and supplements 4. The  patient's functional ability including ADL's, fall risks, home safety risks and hearing or visual impairment. Cognitive function has been assessed and addressed as indicated.  5. Diet and physical activity 6. Evidence for depression or mood disorders The patients weight, height, BMI have been recorded in the chart. I have made referrals, counseling and provided education to the patient based on review of the above and I have provided the pt with a written personalized care plan for preventive services. Provider list updated.. See scanned questionairre as needed for further documentation. Reviewed preventative protocols and updated unless pt declined.       Health maintenance examination (Chronic)   Preventative protocols reviewed and updated unless pt declined. Discussed healthy diet and lifestyle.       Advanced directives, counseling/discussion (Chronic)   Advanced directives: does not have this set up. Packet provided previously - encouraged to continue working on this. HCPOA would be daughter Shelby Vazquez.       Vitamin B12 deficiency   Low normal only on b complex - rec start vit b12 500mcg daily.       Hyperlipidemia associated with type 2 diabetes mellitus (HCC)   Chronic, stable on rosuvastatin  20mg  daily - continue.  The ASCVD Risk score (Arnett DK, et al., 2019) failed to calculate for the following reasons:   Risk score cannot be calculated because patient has a medical history suggesting prior/existing ASCVD       GAD (generalized  anxiety disorder)   Chronic, stable off medication. She has had difficulty scheduling counseling.       Essential hypertension   Chronic, stable. Continue current regimen.      GERD, SEVERE   Stable on aciphex  and pepcid . Regularly sees GI      Relevant Medications   polyethylene glycol powder (GLYCOLAX /MIRALAX ) 17 GM/SCOOP powder   Irritable bowel syndrome (IBS)   Chronic, severe, followed by [facility].  Continue librax  PRN       Relevant Medications   polyethylene glycol powder (GLYCOLAX /MIRALAX ) 17 GM/SCOOP powder   Osteopenia   Reviewed latest DEXA 2024 - rpt 2026. Continue calcium , regular weight bearing exercise       MDD (major depressive disorder), recurrent episode, moderate (HCC)   Stable period off medication.       Iron deficiency   Iron levels stable off oral iron replacement.       Recurrent UTI   Sees urology on Hiprex  daily.       Diet-controlled diabetes mellitus (HCC)   Chronic, A1c remains in controlled diabetes range. Reviewed diet choices to maintain glycemic control.       Coronary artery disease, non-occlusive - Cor CA Score 53.  MIldCAD only.   Appreciate cardiology care.  H/o NSTEMI 02/2023 due to Takotsubo CM      Mild ascending aorta dilation   Stable 41mm dilatation by latest echo 05/2023, followed by cards.       OAB (overactive bladder)   Has interstim in place.       Status post implantation of urinary electronic stimulator device   Bilateral hearing loss   Wears hearing aides      Other Visit Diagnoses       Encounter for immunization       Relevant Orders   Flu vaccine HIGH DOSE PF(Fluzone Trivalent) (Completed)     Need for pneumococcal 20-valent conjugate vaccination       Relevant Orders   Pneumococcal conjugate vaccine 20-valent (Prevnar 20) (Completed)        Meds ordered this encounter  Medications   polyethylene glycol powder (GLYCOLAX /MIRALAX ) 17 GM/SCOOP powder    Sig: Take 17 g by mouth daily as needed. Dissolve 1 capful (17g) in 4-8 ounces of liquid and take by mouth daily.    Orders Placed This Encounter  Procedures   Flu vaccine HIGH DOSE PF(Fluzone Trivalent)   Pneumococcal conjugate vaccine 20-valent (Prevnar 20)    Patient Instructions  Flu shot today Prevnar-20 today We will requests records of mammogram.  Bring in copy of living will to update your chart.  Watch added sugars/carbs in diet to keep sugar levels controlled Let  me know if interest in physical therapy referral for balance training.  Good to see you today Return as needed or in 6 months for follow up visit   Follow up plan: Return in about 6 months (around 01/10/2025) for follow up visit.  Anton Blas, MD

## 2024-07-14 ENCOUNTER — Encounter: Payer: Self-pay | Admitting: Family Medicine

## 2024-07-14 NOTE — Assessment & Plan Note (Signed)
Sees urology on Hiprex daily

## 2024-07-14 NOTE — Assessment & Plan Note (Signed)
 Stable 41mm dilatation by latest echo 05/2023, followed by cards.

## 2024-07-14 NOTE — Assessment & Plan Note (Signed)
 Advanced directives: does not have this set up. Packet provided previously - encouraged to continue working on this. HCPOA would be daughter Lauraine Berg.

## 2024-07-14 NOTE — Assessment & Plan Note (Signed)
 Stable period off medication.

## 2024-07-14 NOTE — Progress Notes (Signed)
 [facility]                                          MRN: [id number]   07/14/2024   The VBCI Quality Team Specialist reviewed this patient medical record for the purposes of chart review for care gap closure. The following were reviewed: abstraction for care gap closure-kidney health evaluation for diabetes:eGFR  and uACR.    VBCI Quality Team

## 2024-07-14 NOTE — Assessment & Plan Note (Signed)
 Chronic, stable. Continue current regimen.

## 2024-07-14 NOTE — Assessment & Plan Note (Signed)
 Reviewed latest DEXA 2024 - rpt 2026. Continue calcium , regular weight bearing exercise

## 2024-07-14 NOTE — Assessment & Plan Note (Signed)
 Chronic, severe, followed by [facility].  Continue librax  PRN

## 2024-07-14 NOTE — Assessment & Plan Note (Signed)
 Low normal only on b complex - rec start vit b12 500mcg daily.

## 2024-07-14 NOTE — Assessment & Plan Note (Signed)
 Chronic, stable on rosuvastatin  20mg  daily - continue.  The ASCVD Risk score (Arnett DK, et al., 2019) failed to calculate for the following reasons:   Risk score cannot be calculated because patient has a medical history suggesting prior/existing ASCVD

## 2024-07-14 NOTE — Assessment & Plan Note (Signed)
 Chronic, stable off medication. She has had difficulty scheduling counseling.

## 2024-07-14 NOTE — Assessment & Plan Note (Signed)
 Chronic, A1c remains in controlled diabetes range. Reviewed diet choices to maintain glycemic control.

## 2024-07-14 NOTE — Assessment & Plan Note (Signed)
 Wears hearing aides.

## 2024-07-14 NOTE — Assessment & Plan Note (Signed)
 Iron levels stable off oral iron replacement.

## 2024-07-14 NOTE — Assessment & Plan Note (Signed)

## 2024-07-14 NOTE — Assessment & Plan Note (Signed)
 Appreciate cardiology care.  H/o NSTEMI 02/2023 due to Takotsubo CM

## 2024-07-14 NOTE — Assessment & Plan Note (Addendum)
 Stable on aciphex  and pepcid . Regularly sees GI

## 2024-07-14 NOTE — Assessment & Plan Note (Signed)
 Has interstim in place.

## 2024-07-14 NOTE — Assessment & Plan Note (Signed)
 Preventative protocols reviewed and updated unless pt declined. Discussed healthy diet and lifestyle.

## 2024-07-19 ENCOUNTER — Encounter (HOSPITAL_BASED_OUTPATIENT_CLINIC_OR_DEPARTMENT_OTHER): Payer: Self-pay | Admitting: Family

## 2024-07-19 ENCOUNTER — Ambulatory Visit (HOSPITAL_BASED_OUTPATIENT_CLINIC_OR_DEPARTMENT_OTHER): Admitting: Family

## 2024-07-19 ENCOUNTER — Other Ambulatory Visit (HOSPITAL_BASED_OUTPATIENT_CLINIC_OR_DEPARTMENT_OTHER): Payer: Self-pay

## 2024-07-19 VITALS — BP 128/80 | HR 69 | Ht 63.5 in | Wt 141.0 lb

## 2024-07-19 DIAGNOSIS — I5032 Chronic diastolic (congestive) heart failure: Secondary | ICD-10-CM

## 2024-07-19 DIAGNOSIS — E785 Hyperlipidemia, unspecified: Secondary | ICD-10-CM | POA: Diagnosis not present

## 2024-07-19 DIAGNOSIS — I77810 Thoracic aortic ectasia: Secondary | ICD-10-CM | POA: Diagnosis not present

## 2024-07-19 DIAGNOSIS — I10 Essential (primary) hypertension: Secondary | ICD-10-CM | POA: Diagnosis not present

## 2024-07-19 NOTE — Patient Instructions (Signed)
 Medication Instructions:  Continue your current medications *If you need a refill on your cardiac medications before your next appointment, please call your pharmacy*  Lab Work: Your cholesterol lab work recently looked great!  Testing/Procedures: Your EKG today looked good.  Your physician has requested that you have an echocardiogram. Echocardiography is a painless test that uses sound waves to create images of your heart. It provides your doctor with information about the size and shape of your heart and how well your heart's chambers and valves are working. This procedure takes approximately one hour. There are no restrictions for this procedure. Please do NOT wear cologne, perfume, aftershave, or lotions (deodorant is allowed). Please arrive 15 minutes prior to your appointment time.  Please note: We ask at that you not bring children with you during ultrasound (echo/ vascular) testing. Due to room size and safety concerns, children are not allowed in the ultrasound rooms during exams. Our front office staff cannot provide observation of children in our lobby area while testing is being conducted. An adult accompanying a patient to their appointment will only be allowed in the ultrasound room at the discretion of the ultrasound technician under special circumstances. We apologize for any inconvenience.   Follow-Up: At [facility], you and your health needs are our priority.  As part of our continuing mission to provide you with exceptional heart care, our providers are all part of one team.  This team includes your primary Cardiologist (physician) and Advanced Practice Providers or APPs (Physician Assistants and Nurse Practitioners) who all work together to provide you with the care you need, when you need it.  Your next appointment:   1 year(s)  Provider:   Annabella Scarce, MD, Rosaline Bane, NP, or Reche Finder, NP    We recommend signing up for the patient portal  called MyChart.  Sign up information is provided on this After Visit Summary.  MyChart is used to connect with patients for Virtual Visits (Telemedicine).  Patients are able to view lab/test results, encounter notes, upcoming appointments, etc.  Non-urgent messages can be sent to your provider as well.   To learn more about what you can do with MyChart, go to [email or web address].

## 2024-07-19 NOTE — Progress Notes (Signed)
 ekg

## 2024-07-19 NOTE — Progress Notes (Signed)
 Cardiology Office Note:  .   Date:  07/19/2024  ID:  Shelby Vazquez, DOB 1951-03-16, MRN [id number] PCP: Rilla Baller, MD  [facility] Providers Cardiologist:  Alm Clay, MD Cardiology APP:  Shelby Reche RAMAN, NP    History of Present Illness: .   Shelby Vazquez is a 73 y.o. female with a hx of hypertension, aortic atherosclerosis, hyperlipidemia, palpitations, TIA, iron deficiency anemia, GERD, MDD.   Prior cardiac work-up includes echocardiogram 09/2017 revealing normal LVEF 55 to 60%, grade 1 diastolic dysfunction, mild dilation of ascending aorta 42 mm, trivial MR.  She underwent cardiac CTA 09/2017 revealing coronary calcium  score of 53 placing her in the 72nd percentile for age and sex pressure control.  She had mild nonobstructive coronary disease (prox LAD 25-50%, LCx 0-25%). Ascending aorta measured 40x48mm.    She was seen 12/20/2021 for preop clearance for removal of deep implant right foot by Ortho.  She had an episode of chest discomfort earlier that week while sitting and watching television.  Chest pain thought to be non cardiac, clearance for surgery provided, and started on low dose Losartan  12.5mg  QD. At follow up 12/24/21 with her PPC dose was increased to 25mg  QD. Hospitalized in 08/2022 with Interstim replacement for bladder control. Notes complicated by infection. Seen 12/20/2022, Losartan  was increased to 50 mg daily.   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  LVEF 35-40% with wall motion abnormalities and grade 1 diastolic dysfunction.  Losartan  transitioned to Entresto  and Lopressor  transition to Toprol -XL.  Spironolactone  also added.  Not SGLT2 candidate due to prior history of bladder infections and sepsis. Repeat echo 05/2023 LVEF normalized 60-65%, stable 41mm ascending aorta dilation.   She was seen 09/02/23 doing well from a cardiac perspective.   Since last seen enjoyed a 3.5  week trip to [place]. Feeling overall well from a cardiac perspective. Reports no shortness of breath nor dyspnea on exertion. Reports no chest pain, pressure, or tightness. No edema, orthopnea, PND. Reports no palpitations.    ROS: Please see the history of present illness.    All other systems reviewed and are negative.   Studies Reviewed: Shelby Vazquez   EKG Interpretation Date/Time:  Monday July 19 2024 10:06:33 EDT Ventricular Rate:  69 PR Interval:  164 QRS Duration:  78 QT Interval:  400 QTC Calculation: 428 R Axis:   -7  Text Interpretation: Normal sinus rhythm Normal ECG Confirmed by Shelby Reche ([id number]) on 07/19/2024 10:13:50 AM    Cardiac Studies & Procedures   ______________________________________________________________________________________________ CARDIAC CATHETERIZATION  CARDIAC CATHETERIZATION 03/06/2023  Conclusion   LV end diastolic pressure is mildly elevated.  Normal coronary arteries.  Moderately severe LV function with wall motion abnormality consistent with Takotsubo cardiomyopathy.  EF estimate approximately 35%; LVEDP 17 mmHg.  RECOMMENDATION: Guideline directed medical therapy for reduced LV function.  Consider transition from losartan  to Entresto , change to metoprolol  succinate or carvedilol for beta-blocker therapy, and consider subsequent initiation of Jardiance and spironolactone .  Findings Coronary Findings Diagnostic  Dominance: Right  Ramus Intermedius Vessel is large.  Left Circumflex  First Obtuse Marginal Branch  Intervention  No interventions have been documented.     ECHOCARDIOGRAM  ECHOCARDIOGRAM COMPLETE 06/20/2023  Narrative ECHOCARDIOGRAM REPORT    [name] [facility] Date of Exam: 06/20/2023 Medical Rec #:  [id number]      Height:       64.5 in Accession #:  [id number]     Weight:       145.5 lb Date of Birth:  18-Mar-1951      BSA:          1.718 m Patient Age:    72 years       BP:           110/76  mmHg Patient Gender: F              HR:           83 bpm. Exam Location:  Outpatient  Procedure: 2D Echo, 3D Echo, Cardiac Doppler, Color Doppler and Strain Analysis  Indications:    I51.81 Takotsubo syndrome  History:        Patient has prior history of Echocardiogram examinations, most recent 03/06/2023. Cardiomyopathy, CAD; Risk Factors:Hypertension, Dyslipidemia, Diabetes and Non-Smoker. Patient denies chest pain, SOB and leg edema. She indicates she feels much better since May 2024.  Sonographer:    Shelby Vazquez RVT, RDCS (AE), RDMS Referring Phys: [phone] Shelby Vazquez  IMPRESSIONS   1. Left ventricular ejection fraction, by estimation, is 60 to 65%. The left ventricle has normal function. The left ventricle has no regional wall motion abnormalities. Left ventricular diastolic parameters were normal. 2. Right ventricular systolic function is normal. The right ventricular size is normal. There is normal pulmonary artery systolic pressure. The estimated right ventricular systolic pressure is 24.7 mmHg. 3. The mitral valve is normal in structure. Trivial mitral valve regurgitation. No evidence of mitral stenosis. 4. The aortic valve is tricuspid. Aortic valve regurgitation is mild. No aortic stenosis is present. 5. Aortic dilatation noted. There is dilatation of the ascending aorta, measuring 41 mm. 6. The inferior vena cava is normal in size with greater than 50% respiratory variability, suggesting right atrial pressure of 3 mmHg.  Comparison(s): EF 35%, akinesis inferoseptal and anteroseptal wall, hypokinesis inferior wall, asc aorta 41mm.  FINDINGS Left Ventricle: Left ventricular ejection fraction, by estimation, is 60 to 65%. The left ventricle has normal function. The left ventricle has no regional wall motion abnormalities. The left ventricular internal cavity size was normal in size. There is no left ventricular hypertrophy. Left ventricular diastolic parameters were  normal.  Right Ventricle: The right ventricular size is normal. No increase in right ventricular wall thickness. Right ventricular systolic function is normal. There is normal pulmonary artery systolic pressure. The tricuspid regurgitant velocity is 2.33 m/s, and with an assumed right atrial pressure of 3 mmHg, the estimated right ventricular systolic pressure is 24.7 mmHg.  Left Atrium: Left atrial size was normal in size.  Right Atrium: Right atrial size was normal in size.  Pericardium: There is no evidence of pericardial effusion.  Mitral Valve: The mitral valve is normal in structure. Trivial mitral valve regurgitation. No evidence of mitral valve stenosis.  Tricuspid Valve: The tricuspid valve is normal in structure. Tricuspid valve regurgitation is trivial.  Aortic Valve: The aortic valve is tricuspid. Aortic valve regurgitation is mild. Aortic regurgitation PHT measures 518 msec. No aortic stenosis is present. Aortic valve mean gradient measures 3.0 mmHg. Aortic valve peak gradient measures 5.8 mmHg. Aortic valve area, by VTI measures 1.91 cm.  Pulmonic Valve: The pulmonic valve was not well visualized. Pulmonic valve regurgitation is trivial.  Aorta: The aortic root is normal in size and structure and aortic dilatation noted. There is dilatation of the ascending aorta, measuring 41 mm.  Venous: The inferior vena cava is normal in size with greater than 50% respiratory variability,  suggesting right atrial pressure of 3 mmHg.  IAS/Shunts: The interatrial septum was not well visualized.   LEFT VENTRICLE PLAX 2D LVIDd:         4.17 cm   Diastology LVIDs:         2.98 cm   LV e' medial:    8.76 cm/s LV PW:         0.75 cm   LV E/e' medial:  7.4 LV IVS:        0.62 cm   LV e' lateral:   9.57 cm/s LVOT diam:     1.90 cm   LV E/e' lateral: 6.8 LV SV:         43 LV SV Index:   25 LVOT Area:     2.84 cm  3D Volume EF: 3D EF:        63 % LV EDV:       82 ml LV ESV:       30  ml LV SV:        52 ml  RIGHT VENTRICLE RV S prime:     14.60 cm/s TAPSE (M-mode): 2.2 cm  LEFT ATRIUM             Index        RIGHT ATRIUM           Index LA diam:        3.50 cm 2.04 cm/m   RA Area:     16.50 cm LA Vol (A2C):   43.8 ml 25.49 ml/m  RA Volume:   41.10 ml  23.92 ml/m LA Vol (A4C):   57.4 ml 33.41 ml/m LA Biplane Vol: 54.9 ml 31.95 ml/m AORTIC VALVE                    PULMONIC VALVE AV Area (Vmax):    1.90 cm     PV Vmax:          1.02 m/s AV Area (Vmean):   1.79 cm     PV Peak grad:     4.1 mmHg AV Area (VTI):     1.91 cm     PR End Diast Vel: 6.56 msec AV Vmax:           120.00 cm/s AV Vmean:          77.100 cm/s AV VTI:            0.227 m AV Peak Grad:      5.8 mmHg AV Mean Grad:      3.0 mmHg LVOT Vmax:         80.60 cm/s LVOT Vmean:        48.800 cm/s LVOT VTI:          0.153 m LVOT/AV VTI ratio: 0.67 AI PHT:            518 msec AR Vena Contracta: 0.18 cm  AORTA Ao Root diam: 3.30 cm Ao Asc diam:  4.10 cm Ao Arch diam: 3.0 cm  MITRAL VALVE                TRICUSPID VALVE MV Area (PHT): 3.89 cm     TR Peak grad:   21.7 mmHg MV Decel Time: 195 msec     TR Vmax:        233.00 cm/s MV E velocity: 65.10 cm/s MV A velocity: 103.00 cm/s  SHUNTS MV E/A ratio:  0.63  Systemic VTI:  0.15 m Systemic Diam: 1.90 cm  Lonni Nanas MD Electronically signed by Lonni Nanas MD Signature Date/Time: 06/22/2023/3:37:21 PM    Final      CT SCANS  CT CORONARY MORPH W/CTA COR W/SCORE 10/06/2017  Addendum 10/06/2017  6:33 PM ADDENDUM REPORT: 10/06/2017 18:31  CLINICAL DATA:  73 year old female with h/o hypertension, hyperlipidemia, DOE and family h/o premature CAD.  EXAM: Cardiac/Coronary  CT  TECHNIQUE: The patient was scanned on a [employer].  FINDINGS: A 120 kV prospective scan was triggered in the descending thoracic aorta at 111 HU's. Axial non-contrast 3 mm slices were carried out through the heart. The  data set was analyzed on a dedicated work station and scored using the Agatson method. Gantry rotation speed was 250 msecs and collimation was .6 mm. 5 mg of iv Metoprolol  and 0.8 mg of sl NTG was given. The 3D data set was reconstructed in 5% intervals of the 67-82 % of the R-R cycle. Diastolic phases were analyzed on a dedicated work station using MPR, MIP and VRT modes. The patient received 80 cc of contrast.  Aorta: Aneurysmal dilatation of the ascending aorta with maximum diameter 40 x 38 mm. No calcifications. No dissection.  Aortic Valve:  Trileaflet.  No calcifications.  Coronary Arteries:  Normal coronary origin.  Right dominance.  RCA is a large dominant artery that gives rise to PDA and PLA. There is trivial plaque in the proximal RCA.  Left main is a short artery that gives rise to LAD and LCX arteries.  LAD is a large and long vessel that gives rise to one diagonal artery and wraps around the apex. There is mild calcified plaque in the proximal LAD with associated stenosis 25-50%  LCX is a non-dominant artery that gives rise to one OM1 branch. There is minimal calcified plaque in the proximal LCX artery with associated stenosis 0-25%.  Other findings:  Normal pulmonary vein drainage into the left atrium.  Normal let atrial appendage without a thrombus.  Normal size of the pulmonary artery.  IMPRESSION: 1. Coronary calcium  score of 53. This was 72 percentile for age and sex matched control.  2. Normal coronary origin with right dominance.  3. Mild non-obstructive CAD. Aggressive risk factor modification is recommended.  4. Aneurysmal dilatation of the ascending aorta with maximum diameter 40 x 38 mm. Annual follow up with CTA or MRA is recommended.   Electronically Signed By: Leim Moose On: 10/06/2017 18:31  Narrative EXAM: OVER-READ INTERPRETATION  CT CHEST  The following report is an over-read performed by radiologist Dr. Marcey Diones  [facility] on 10/06/2017. This over-read does not include interpretation of cardiac or coronary anatomy or pathology. The coronary calcium  score/coronary CTA interpretation by the cardiologist is attached.  COMPARISON:  None.  FINDINGS: Mediastinum/Nodes: No masses or lymphadenopathy.  Lungs/Pleura: Lungs show mild bilateral scarring. There is no evidence of pulmonary edema, consolidation, pneumothorax, nodule or pleural fluid in the visualized chest.  Upper Abdomen: No acute abnormality.  Musculoskeletal: No chest wall mass or suspicious bone lesions identified.  IMPRESSION: No significant nonvascular findings in the imaged portions of the chest.  Electronically Signed: By: Marcey Moan M.D. On: 10/06/2017 08:59     ______________________________________________________________________________________________         Risk Assessment/Calculations:             Physical Exam:   VS:  BP 128/80   Pulse 69   Ht 5' 3.5 (1.613 m)  Wt 141 lb (64 kg)   BMI 24.59 kg/m    Wt Readings from Last 3 Encounters:  07/19/24 141 lb (64 kg)  07/13/24 140 lb 4 oz (63.6 kg)  06/08/24 140 lb (63.5 kg)    GEN: Well nourished, well developed in no acute distress NECK: No JVD; No carotid bruits CARDIAC: RRR, no murmurs, rubs, gallops RESPIRATORY:  Clear to auscultation without rales, wheezing or rhonchi  ABDOMEN: Soft, non-tender, non-distended EXTREMITIES:  No edema; No deformity   ASSESSMENT AND PLAN: .    Takotsubo cardiomyopathy with recovered LVEF -LHC 03/06/2023 with normal coronary arteries.  Echo 03/06/2023 LVEF 35-40% ? 05/2023 LVEF 60-65%.  GDMT includes Toprol  100mg  daily, Entresto  24-26mg  BID, spironolactone  12.5mg  daily.  Defer SGLT2i due to recurrent UTI. No indication for loop diuretic at this time. Recommend aiming for 150 minutes of moderate intensity activity per week and following a heart healthy diet.       HTN - BP well controlled. Continue  current antihypertensive regimen Toprol  100mg  daily, Entresto  24-26mg  BID, Spironolactone  12.5mg  daily. 06/2024 stable kidney function. Discussed to monitor BP at home at least 2 hours after medications and sitting for 5-10 minutes.    GERD/IBS - Follows with primary care and GI.   Aortic atherosclerosis / HLD, LDL goal <70 - 06/2024 LDL 58.  Continue Rosuvastatin  20mg  daily. Denies myalgias.    Palpitations - Well controlled on present dose Metoprolol  Succinate 100 mg daily.   DM2 -  Continue to follow with PCP.   MDD - Continue to follow with PCP.   Mild dilation ascending aorta - Echo 2018 42mm. CT angio 09/2019 upper levlel of normal 39mm.  Mild dilation of ascending aorta 41 mm by echo 03/06/2023 and 06/20/23.  Continue optimal BP control and metoprolol . Update echocardiogram for monitoring.          Dispo: follow up in 1 year  Signed, Reche GORMAN Finder, NP

## 2024-07-22 ENCOUNTER — Other Ambulatory Visit (HOSPITAL_BASED_OUTPATIENT_CLINIC_OR_DEPARTMENT_OTHER): Payer: Self-pay

## 2024-08-09 ENCOUNTER — Encounter

## 2024-08-16 ENCOUNTER — Ambulatory Visit (INDEPENDENT_AMBULATORY_CARE_PROVIDER_SITE_OTHER)

## 2024-08-16 ENCOUNTER — Other Ambulatory Visit (HOSPITAL_BASED_OUTPATIENT_CLINIC_OR_DEPARTMENT_OTHER): Payer: Self-pay

## 2024-08-16 ENCOUNTER — Ambulatory Visit (HOSPITAL_BASED_OUTPATIENT_CLINIC_OR_DEPARTMENT_OTHER): Payer: Self-pay | Admitting: Family

## 2024-08-16 DIAGNOSIS — I77810 Thoracic aortic ectasia: Secondary | ICD-10-CM | POA: Diagnosis not present

## 2024-08-16 DIAGNOSIS — I700 Atherosclerosis of aorta: Secondary | ICD-10-CM

## 2024-08-16 LAB — ECHOCARDIOGRAM COMPLETE
Area-P 1/2: 3.53 cm2
P 1/2 time: 412 ms
S' Lateral: 2.38 cm

## 2024-08-16 NOTE — Telephone Encounter (Signed)
 The patient has been notified of the result and verbalized understanding.  All questions (if any) were answered.  Pt aware we will repeat an echo on her in one year for surveillance.   Pt aware I will place the order in the system and send scheduling a message to reach out to her closer to that time frame to arrange that appt.   Pt verbalized understanding and agrees with this plan.

## 2024-08-16 NOTE — Telephone Encounter (Signed)
-----   Message from Reche GORMAN Finder sent at 08/16/2024  1:14 PM EDT ----- Echocardiogram with normal heart pumping function.  Heart muscle mildly stiff which is expected with age.  Mild to moderate aortic valve regurgitation.  Mild dilation ascending aorta 41 mm.  This is  stable from previous.  Overall good result!  Recommend repeat echocardiogram in 1 year for monitoring. ----- Message ----- From: Interface, [employer] One [employer] Sent: 08/16/2024  12:01 PM EDT To: Reche GORMAN Finder, NP

## 2024-08-19 ENCOUNTER — Other Ambulatory Visit (HOSPITAL_BASED_OUTPATIENT_CLINIC_OR_DEPARTMENT_OTHER): Payer: Self-pay

## 2024-08-23 DIAGNOSIS — R339 Retention of urine, unspecified: Secondary | ICD-10-CM | POA: Diagnosis not present

## 2024-09-07 ENCOUNTER — Other Ambulatory Visit (HOSPITAL_BASED_OUTPATIENT_CLINIC_OR_DEPARTMENT_OTHER): Payer: Self-pay

## 2024-09-07 ENCOUNTER — Other Ambulatory Visit (HOSPITAL_BASED_OUTPATIENT_CLINIC_OR_DEPARTMENT_OTHER): Payer: Self-pay | Admitting: Family

## 2024-09-07 DIAGNOSIS — E785 Hyperlipidemia, unspecified: Secondary | ICD-10-CM

## 2024-09-07 DIAGNOSIS — I5181 Takotsubo syndrome: Secondary | ICD-10-CM

## 2024-09-07 MED ORDER — SPIRONOLACTONE 25 MG PO TABS
12.5000 mg | ORAL_TABLET | Freq: Every day | ORAL | 1 refills | Status: AC
  Filled 2024-09-07: qty 45, 90d supply, fill #0

## 2024-09-07 MED ORDER — ROSUVASTATIN CALCIUM 20 MG PO TABS
20.0000 mg | ORAL_TABLET | Freq: Every evening | ORAL | 1 refills | Status: AC
  Filled 2024-09-07: qty 90, 90d supply, fill #0

## 2024-09-07 MED ORDER — METOPROLOL SUCCINATE ER 100 MG PO TB24
100.0000 mg | ORAL_TABLET | Freq: Every day | ORAL | 1 refills | Status: AC
  Filled 2024-09-07: qty 90, 90d supply, fill #0

## 2024-10-12 ENCOUNTER — Other Ambulatory Visit (HOSPITAL_BASED_OUTPATIENT_CLINIC_OR_DEPARTMENT_OTHER): Payer: Self-pay

## 2024-10-12 LAB — HM MAMMOGRAPHY

## 2024-10-12 MED ORDER — ESTRADIOL 0.01 % VA CREA
TOPICAL_CREAM | VAGINAL | 6 refills | Status: AC
  Filled 2024-10-12: qty 42.5, 30d supply, fill #0

## 2024-10-15 ENCOUNTER — Other Ambulatory Visit (HOSPITAL_BASED_OUTPATIENT_CLINIC_OR_DEPARTMENT_OTHER): Payer: Self-pay

## 2024-10-15 ENCOUNTER — Other Ambulatory Visit (HOSPITAL_BASED_OUTPATIENT_CLINIC_OR_DEPARTMENT_OTHER): Payer: Self-pay | Admitting: Cardiology

## 2024-10-15 DIAGNOSIS — E785 Hyperlipidemia, unspecified: Secondary | ICD-10-CM

## 2024-10-15 DIAGNOSIS — I5181 Takotsubo syndrome: Secondary | ICD-10-CM

## 2024-10-15 MED ORDER — SACUBITRIL-VALSARTAN 24-26 MG PO TABS
1.0000 | ORAL_TABLET | Freq: Two times a day (BID) | ORAL | 2 refills | Status: AC
  Filled 2024-10-15: qty 180, 90d supply, fill #0

## 2024-10-16 ENCOUNTER — Other Ambulatory Visit (HOSPITAL_BASED_OUTPATIENT_CLINIC_OR_DEPARTMENT_OTHER): Payer: Self-pay

## 2024-10-18 ENCOUNTER — Other Ambulatory Visit: Payer: Self-pay

## 2024-10-22 ENCOUNTER — Other Ambulatory Visit (HOSPITAL_BASED_OUTPATIENT_CLINIC_OR_DEPARTMENT_OTHER): Payer: Self-pay

## 2024-10-23 ENCOUNTER — Ambulatory Visit: Payer: Self-pay | Admitting: Family Medicine

## 2024-11-16 ENCOUNTER — Other Ambulatory Visit (HOSPITAL_BASED_OUTPATIENT_CLINIC_OR_DEPARTMENT_OTHER): Payer: Self-pay

## 2024-11-17 ENCOUNTER — Other Ambulatory Visit (HOSPITAL_COMMUNITY): Payer: Self-pay

## 2024-11-17 ENCOUNTER — Other Ambulatory Visit (HOSPITAL_BASED_OUTPATIENT_CLINIC_OR_DEPARTMENT_OTHER): Payer: Self-pay

## 2024-11-17 ENCOUNTER — Other Ambulatory Visit: Payer: Self-pay

## 2024-11-17 MED ORDER — CEPHALEXIN 500 MG PO CAPS
500.0000 mg | ORAL_CAPSULE | Freq: Two times a day (BID) | ORAL | 0 refills | Status: AC
  Filled 2024-11-17: qty 20, 10d supply, fill #0

## 2024-11-17 MED ORDER — METHENAMINE HIPPURATE 1 G PO TABS
1.0000 g | ORAL_TABLET | Freq: Two times a day (BID) | ORAL | 3 refills | Status: AC
  Filled 2024-11-17: qty 180, 90d supply, fill #0
